# Patient Record
Sex: Male | Born: 1950
Health system: Southern US, Community
[De-identification: ages and names within clinical notes are randomized; demographics above are authoritative.]

## PROBLEM LIST (undated history)

## (undated) DIAGNOSIS — E1140 Type 2 diabetes mellitus with diabetic neuropathy, unspecified: Secondary | ICD-10-CM

## (undated) DIAGNOSIS — I10 Essential (primary) hypertension: Secondary | ICD-10-CM

## (undated) DIAGNOSIS — E119 Type 2 diabetes mellitus without complications: Secondary | ICD-10-CM

## (undated) DIAGNOSIS — K219 Gastro-esophageal reflux disease without esophagitis: Secondary | ICD-10-CM

## (undated) DIAGNOSIS — M1990 Unspecified osteoarthritis, unspecified site: Secondary | ICD-10-CM

## (undated) DIAGNOSIS — E7800 Pure hypercholesterolemia, unspecified: Secondary | ICD-10-CM

## (undated) DIAGNOSIS — H547 Unspecified visual loss: Secondary | ICD-10-CM

## (undated) HISTORY — DX: Unspecified osteoarthritis, unspecified site: M19.90

## (undated) HISTORY — DX: Essential (primary) hypertension: I10

## (undated) HISTORY — DX: Type 2 diabetes mellitus with diabetic neuropathy, unspecified: E11.40

## (undated) HISTORY — PX: HAND SURGERY: SHX662

## (undated) HISTORY — DX: Pure hypercholesterolemia, unspecified: E78.00

## (undated) HISTORY — PX: EYE SURGERY: SHX253

## (undated) HISTORY — DX: Unspecified visual loss: H54.7

## (undated) HISTORY — DX: Gastro-esophageal reflux disease without esophagitis: K21.9

## (undated) HISTORY — DX: Type 2 diabetes mellitus without complications: E11.9

## (undated) HISTORY — PX: TONSILLECTOMY: SUR1361

---

## 1977-12-09 HISTORY — PX: NOSE SURGERY: SHX723

## 1998-12-09 DIAGNOSIS — E119 Type 2 diabetes mellitus without complications: Secondary | ICD-10-CM

## 1998-12-09 DIAGNOSIS — E1165 Type 2 diabetes mellitus with hyperglycemia: Secondary | ICD-10-CM | POA: Insufficient documentation

## 1998-12-09 DIAGNOSIS — E1142 Type 2 diabetes mellitus with diabetic polyneuropathy: Secondary | ICD-10-CM | POA: Insufficient documentation

## 1998-12-09 HISTORY — DX: Type 2 diabetes mellitus without complications: E11.9

## 2002-10-22 ENCOUNTER — Encounter: Payer: Self-pay | Admitting: Internal Medicine

## 2002-10-22 ENCOUNTER — Encounter: Admission: RE | Admit: 2002-10-22 | Discharge: 2002-10-22 | Payer: Self-pay | Admitting: Internal Medicine

## 2002-11-16 ENCOUNTER — Encounter: Admission: RE | Admit: 2002-11-16 | Discharge: 2003-01-05 | Payer: Self-pay | Admitting: Internal Medicine

## 2003-03-24 ENCOUNTER — Ambulatory Visit (HOSPITAL_COMMUNITY): Admission: RE | Admit: 2003-03-24 | Discharge: 2003-03-24 | Payer: Self-pay | Admitting: Gastroenterology

## 2003-03-24 ENCOUNTER — Encounter (INDEPENDENT_AMBULATORY_CARE_PROVIDER_SITE_OTHER): Payer: Self-pay | Admitting: Specialist

## 2007-08-12 ENCOUNTER — Encounter: Admission: RE | Admit: 2007-08-12 | Discharge: 2007-08-12 | Payer: Self-pay | Admitting: Internal Medicine

## 2008-12-09 DIAGNOSIS — I10 Essential (primary) hypertension: Secondary | ICD-10-CM

## 2008-12-09 DIAGNOSIS — E1159 Type 2 diabetes mellitus with other circulatory complications: Secondary | ICD-10-CM | POA: Insufficient documentation

## 2008-12-09 HISTORY — DX: Essential (primary) hypertension: I10

## 2011-04-26 NOTE — Op Note (Signed)
NAME:  Gregory Harrison, Gregory Harrison NO.:  [id number]   MEDICAL RECORD NO.:  [id number]                   PATIENT TYPE:  AMB   LOCATION:  ENDO                                 FACILITY:  [facility]   PHYSICIAN:  Danise Edge, M.D.                DATE OF BIRTH:  January 09, 1951   DATE OF PROCEDURE:  03/24/2003  DATE OF DISCHARGE:                                 OPERATIVE REPORT   PROCEDURE INDICATION:  Gregory Harrison is a 60 year old male born June 08, 1951.  Gregory Harrison is scheduled to undergo his first screening colonoscopy  with polypectomy to prevent colon cancer.   PREMEDICATION:  Versed 7.5 mg, Demerol 50 mg.   PROCEDURE:  After obtaining informed consent, Gregory Harrison was placed in the  left lateral decubitus position.  I administered intravenous Demerol and  intravenous Versed to achieve conscious sedation for the procedure.  The  patient's blood pressure, oxygen saturation, and cardiac rhythm were  monitored throughout the procedure and documented in the medical record.   Anal inspection was normal.  Digital rectal exam revealed a small non-  nodular prostate.  The Olympus adult colonoscope was introduced into the  rectum and advanced to the cecum.  Colonoscopic preparation for the exam  today was excellent.   RECTUM:  A 0.5 mm sessile polyp was removed from the distal rectum with the  cold biopsy forceps.  SIGMOID COLON AND DESCENDING COLON:  At 30 cm from the anal verge, a 2 mm  sessile polyp was removed with electrocautery snare.  At 45 cm from the anal  verge, a 2 mm sessile polyp was removed with the electrocautery snare.  SPLENIC FLEXURE:  Normal.  TRANSVERSE COLON:  From the proximal transverse colon, a one mm sessile  polyp was removed with electrocautery snare, but no path returned.  HEPATIC FLEXURE:  Normal.  ASCENDING COLON:  A 4 mm sessile polyp was removed from the proximal  ascending with the electrocautery snare after lifting the polyp by  submucosal saline injection.  CECUM AND ILEOCECAL VALVE:  Normal.   ASSESSMENT:  1. From the proximal ascending colon, a 4 mm sessile polyp was removed with     the electrocautery snare.  2. From the proximal transverse colon, a 1 mm sessile polyp was removed with     the electrocautery snare, but no specimen retrieved for path.  3.     Two small polyps were removed from the left colon and a small polyp was     removed from the rectum.  All polyps were submitted in bottle for     pathological evaluation.  4. Right colonic diverticulosis.  Danise Edge, M.D.    MJ/MEDQ  D:  03/24/2003  T:  03/24/2003  Job:  [id number]   cc:   Georgann Housekeeper, M.D.  [address]  [place]  [place] [address]  Fax: [phone]

## 2012-02-04 ENCOUNTER — Ambulatory Visit: Payer: Self-pay | Admitting: *Deleted

## 2012-03-19 ENCOUNTER — Encounter (INDEPENDENT_AMBULATORY_CARE_PROVIDER_SITE_OTHER): Payer: PRIVATE HEALTH INSURANCE | Admitting: Ophthalmology

## 2012-03-19 DIAGNOSIS — H2510 Age-related nuclear cataract, unspecified eye: Secondary | ICD-10-CM

## 2012-03-19 DIAGNOSIS — H3581 Retinal edema: Secondary | ICD-10-CM

## 2012-03-19 DIAGNOSIS — E11319 Type 2 diabetes mellitus with unspecified diabetic retinopathy without macular edema: Secondary | ICD-10-CM

## 2012-03-19 DIAGNOSIS — H43819 Vitreous degeneration, unspecified eye: Secondary | ICD-10-CM

## 2012-03-19 DIAGNOSIS — I10 Essential (primary) hypertension: Secondary | ICD-10-CM

## 2012-03-19 DIAGNOSIS — H35039 Hypertensive retinopathy, unspecified eye: Secondary | ICD-10-CM

## 2012-03-19 DIAGNOSIS — E1139 Type 2 diabetes mellitus with other diabetic ophthalmic complication: Secondary | ICD-10-CM

## 2012-03-27 ENCOUNTER — Ambulatory Visit (INDEPENDENT_AMBULATORY_CARE_PROVIDER_SITE_OTHER): Payer: PRIVATE HEALTH INSURANCE | Admitting: Ophthalmology

## 2012-03-27 DIAGNOSIS — H3581 Retinal edema: Secondary | ICD-10-CM

## 2012-07-27 ENCOUNTER — Ambulatory Visit (INDEPENDENT_AMBULATORY_CARE_PROVIDER_SITE_OTHER): Payer: PRIVATE HEALTH INSURANCE | Admitting: Ophthalmology

## 2015-03-15 ENCOUNTER — Other Ambulatory Visit: Payer: Self-pay | Admitting: Family Medicine

## 2015-03-15 ENCOUNTER — Ambulatory Visit
Admission: RE | Admit: 2015-03-15 | Discharge: 2015-03-15 | Disposition: A | Discharge status: Home / Self Care | Payer: PPO | Source: Ambulatory Visit | Attending: Family Medicine | Admitting: Family Medicine

## 2015-03-15 DIAGNOSIS — M1990 Unspecified osteoarthritis, unspecified site: Secondary | ICD-10-CM

## 2015-04-03 ENCOUNTER — Encounter (INDEPENDENT_AMBULATORY_CARE_PROVIDER_SITE_OTHER): Payer: Self-pay | Admitting: Ophthalmology

## 2015-05-15 ENCOUNTER — Encounter (INDEPENDENT_AMBULATORY_CARE_PROVIDER_SITE_OTHER): Payer: Self-pay | Admitting: Ophthalmology

## 2015-08-23 ENCOUNTER — Encounter (INDEPENDENT_AMBULATORY_CARE_PROVIDER_SITE_OTHER): Payer: Self-pay | Admitting: Ophthalmology

## 2016-01-01 DIAGNOSIS — E1065 Type 1 diabetes mellitus with hyperglycemia: Secondary | ICD-10-CM | POA: Diagnosis not present

## 2016-01-01 DIAGNOSIS — I10 Essential (primary) hypertension: Secondary | ICD-10-CM | POA: Diagnosis not present

## 2016-01-01 DIAGNOSIS — G894 Chronic pain syndrome: Secondary | ICD-10-CM | POA: Diagnosis not present

## 2016-01-01 DIAGNOSIS — F17200 Nicotine dependence, unspecified, uncomplicated: Secondary | ICD-10-CM | POA: Diagnosis not present

## 2016-02-28 DIAGNOSIS — E1065 Type 1 diabetes mellitus with hyperglycemia: Secondary | ICD-10-CM | POA: Diagnosis not present

## 2016-02-28 DIAGNOSIS — G894 Chronic pain syndrome: Secondary | ICD-10-CM | POA: Diagnosis not present

## 2016-05-09 DIAGNOSIS — G894 Chronic pain syndrome: Secondary | ICD-10-CM | POA: Diagnosis not present

## 2016-05-09 DIAGNOSIS — S6990XS Unspecified injury of unspecified wrist, hand and finger(s), sequela: Secondary | ICD-10-CM | POA: Diagnosis not present

## 2016-05-09 DIAGNOSIS — M779 Enthesopathy, unspecified: Secondary | ICD-10-CM | POA: Diagnosis not present

## 2016-05-09 DIAGNOSIS — H109 Unspecified conjunctivitis: Secondary | ICD-10-CM | POA: Diagnosis not present

## 2016-07-29 DIAGNOSIS — F17200 Nicotine dependence, unspecified, uncomplicated: Secondary | ICD-10-CM | POA: Diagnosis not present

## 2016-07-29 DIAGNOSIS — E1065 Type 1 diabetes mellitus with hyperglycemia: Secondary | ICD-10-CM | POA: Diagnosis not present

## 2016-07-29 DIAGNOSIS — G894 Chronic pain syndrome: Secondary | ICD-10-CM | POA: Diagnosis not present

## 2016-07-29 DIAGNOSIS — I10 Essential (primary) hypertension: Secondary | ICD-10-CM | POA: Diagnosis not present

## 2016-10-10 DIAGNOSIS — E1065 Type 1 diabetes mellitus with hyperglycemia: Secondary | ICD-10-CM | POA: Diagnosis not present

## 2016-10-10 DIAGNOSIS — J40 Bronchitis, not specified as acute or chronic: Secondary | ICD-10-CM | POA: Diagnosis not present

## 2017-03-12 DIAGNOSIS — I10 Essential (primary) hypertension: Secondary | ICD-10-CM | POA: Diagnosis not present

## 2017-03-12 DIAGNOSIS — G894 Chronic pain syndrome: Secondary | ICD-10-CM | POA: Diagnosis not present

## 2017-03-12 DIAGNOSIS — E1065 Type 1 diabetes mellitus with hyperglycemia: Secondary | ICD-10-CM | POA: Diagnosis not present

## 2017-04-09 DIAGNOSIS — R52 Pain, unspecified: Secondary | ICD-10-CM | POA: Diagnosis not present

## 2017-04-09 DIAGNOSIS — E1065 Type 1 diabetes mellitus with hyperglycemia: Secondary | ICD-10-CM | POA: Diagnosis not present

## 2018-02-24 ENCOUNTER — Encounter: Payer: Self-pay | Admitting: Family Medicine

## 2018-02-24 ENCOUNTER — Ambulatory Visit (INDEPENDENT_AMBULATORY_CARE_PROVIDER_SITE_OTHER): Payer: PPO | Admitting: Family Medicine

## 2018-02-24 ENCOUNTER — Other Ambulatory Visit: Payer: Self-pay

## 2018-02-24 DIAGNOSIS — M1990 Unspecified osteoarthritis, unspecified site: Secondary | ICD-10-CM | POA: Insufficient documentation

## 2018-02-24 DIAGNOSIS — G8929 Other chronic pain: Secondary | ICD-10-CM

## 2018-02-24 DIAGNOSIS — I10 Essential (primary) hypertension: Secondary | ICD-10-CM | POA: Diagnosis not present

## 2018-02-24 DIAGNOSIS — L989 Disorder of the skin and subcutaneous tissue, unspecified: Secondary | ICD-10-CM

## 2018-02-24 DIAGNOSIS — E1142 Type 2 diabetes mellitus with diabetic polyneuropathy: Secondary | ICD-10-CM | POA: Diagnosis not present

## 2018-02-24 DIAGNOSIS — E1169 Type 2 diabetes mellitus with other specified complication: Secondary | ICD-10-CM | POA: Insufficient documentation

## 2018-02-24 DIAGNOSIS — E7800 Pure hypercholesterolemia, unspecified: Secondary | ICD-10-CM | POA: Diagnosis not present

## 2018-02-24 DIAGNOSIS — E1140 Type 2 diabetes mellitus with diabetic neuropathy, unspecified: Secondary | ICD-10-CM | POA: Insufficient documentation

## 2018-02-24 LAB — POCT GLYCOSYLATED HEMOGLOBIN (HGB A1C): HEMOGLOBIN A1C: 12

## 2018-02-24 NOTE — Progress Notes (Signed)
Subjective:  Gregory Harrison is a 67 y.o. male who presents to the [facility] today to establish as new patient.  HPI:  Previously seen at [facility] by Dr. Tamsen Roers. Patient states that had falling with last PCP over pain medications and recommendation to see nutritionist. Patient was not able to see nutritionist due to cost. Last saw him in July 2018  Otherwise saw eye doctor Gaylene Brooks but last saw him in 03-23-14. He does have some issues chronic blurry vision in R eye that dates back to childhood. No new vision changes  Chronic pain States has arthritis of multiple joints, L shoulder, bilaterally knees. Always has pain and cramping. Has been active his whole life and has lots of wear and tear on his joints. Takes norco 5-325mg  1 tablet BID.  Agreeable to signing pain med contract today.  Skin concerns Has multiple sores over his upper chest and arms. He is in the sun quite a bit as he likes to fish and does not wear sunscreen. These are long standing for patient.  Diabetes with neuropathy Takes novolin 25U BID, not sure if regular or 70/30 type. He gets this from [facility] on [address]. He is no other diabetes medications but does have peripheral neuropathy and is on gabapentin 300mg  daily prn. No CP, SOB, polyuria, polydipsia  HTN Takes HCTZ 12.5mg  qd and clonidine 0.2mg  qd. Does not check BP at home.  No HA, lightheadness, dizziness, LE edema.   ROS: per HPI, otherwise all systems reviewed and negative  PMH:  The following were reviewed and entered/updated in epic: Past Medical History:  Diagnosis Date  . Arthritis    2009/03/23  . Diabetes ([place]) 03-24-1999  . Diabetic neuropathy ([place])   . HTN (hypertension) 23-Mar-2009  . Hypercholesterolemia   . 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 22, blurry vision   Patient Active Problem List   Diagnosis Date Noted  . Hypercholesterolemia   . Diabetic neuropathy ([place])   . Arthritis   . HTN (hypertension) 12/09/2008  .  Diabetes ([place]) 12/09/1998   Past Surgical History:  Procedure Laterality Date  . EYE SURGERY Left 1990s   " to peel off a growth"  . HAND SURGERY  1980s   R hand pointer finger   . NOSE SURGERY  1979   after broke his nose    Family History  Problem Relation Age of Onset  . Heart attack Father        died in 23-Mar-1982, had open surgery  . Heart murmur Father   . Other Mother        bladder taken out for unknown reason. unspecified brain tumor  . ALS Brother   . Diabetes Paternal Grandfather     Medications- reviewed and updated Current Outpatient Medications  Medication Sig Dispense Refill  . HYDROcodone-acetaminophen (NORCO/VICODIN) 5-325 MG tablet Take 1 tablet by mouth 2 (two) times daily as needed.    . naproxen sodium (ALEVE) 220 MG tablet Take 220 mg by mouth as needed.    . cloNIDine (CATAPRES) 0.2 MG tablet Take 1 tablet by mouth daily.    Marland Kitchen gabapentin (NEURONTIN) 300 MG capsule Take 1 capsule by mouth at bedtime as needed.  5  . hydrochlorothiazide (MICROZIDE) 12.5 MG capsule Take 1 capsule by mouth daily.     No current facility-administered medications for this visit.     Allergies-reviewed and updated No Known Allergies  Social History   Socioeconomic History  .  Marital status: Married    Spouse name: None  . Number of children: None  . Years of education: None  . Highest education level: None  Social Needs  . Financial resource strain: None  . Food insecurity - worry: None  . Food insecurity - inability: None  . Transportation needs - medical: None  . Transportation needs - non-medical: None  Occupational History  . Occupation: retired  Tobacco Use  . Smoking status: Former Smoker    Packs/day: 2.00    Years: 20.00    Pack years: 40.00  . Smokeless tobacco: Never Used  Substance and Sexual Activity  . Alcohol use: No    Frequency: Never    Comment: quit in 1995  . Drug use: Yes    Types: Marijuana    Comment: marijuana pills for pain, going to  quit.   Marland Kitchen Sexual activity: Yes    Partners: Female  Other Topics Concern  . None  Social History Narrative   Lives with wife, daughter, 3 grandchildren.   Enjoys fishing and hunting and playing pool    Objective:  Physical Exam: BP (!) 152/90   Pulse (!) 52   Ht 6' 2.5" (1.892 m)   Wt 195 lb (88.5 kg)   SpO2 99%   BMI 24.70 kg/m   Gen: NAD, resting comfortably CV: RRR with no murmurs appreciated Pulm: NWOB, CTAB with no crackles, wheezes, or rhonchi GI: Normal bowel sounds present. Soft, Nontender, Nondistended. MSK: no edema, cyanosis, or clubbing noted Skin: warm, dry. Multiple nonhealing erythematous rough patches on upper chest and arms.  Neuro: grossly normal, moves all extremities Psych: Normal affect and thought content  Results for orders placed or performed in visit on 02/24/18 (from the past 72 hour(s))  POCT glycosylated hemoglobin (Hb A1C)     Status: Abnormal   Collection Time: 02/24/18  2:30 PM  Result Value Ref Range   Hemoglobin A1C 12.0   CBC     Status: Abnormal   Collection Time: 02/24/18  4:13 PM  Result Value Ref Range   WBC 5.8 3.4 - 10.8 x10E3/uL   RBC 4.84 4.14 - 5.80 x10E6/uL   Hemoglobin 13.4 13.0 - 17.7 g/dL   Hematocrit 43.1 37.5 - 51.0 %   MCV 89 79 - 97 fL   MCH 27.7 26.6 - 33.0 pg   MCHC 31.1 (L) 31.5 - 35.7 g/dL   RDW 14.6 12.3 - 15.4 %   Platelets 214 150 - 379 T73U2/GU  Basic metabolic panel     Status: Abnormal   Collection Time: 02/24/18  4:13 PM  Result Value Ref Range   Glucose 299 (H) 65 - 99 mg/dL   BUN 11 8 - 27 mg/dL   Creatinine, Ser 0.82 0.76 - 1.27 mg/dL   GFR calc non Af Amer 91 >59 mL/min/1.73   GFR calc Af Amer 105 >59 mL/min/1.73   BUN/Creatinine Ratio 13 10 - 24   Sodium 139 134 - 144 mmol/L   Potassium 4.6 3.5 - 5.2 mmol/L   Chloride 99 96 - 106 mmol/L   CO2 25 20 - 29 mmol/L   Calcium 9.5 8.6 - 10.2 mg/dL  LDL cholesterol, direct     Status: Abnormal   Collection Time: 02/24/18  4:13 PM  Result Value Ref  Range   LDL Direct 134 (H) 0 - 99 mg/dL  Lipid panel     Status: Abnormal   Collection Time: 02/24/18  4:13 PM  Result Value Ref Range  Cholesterol, Total 200 (H) 100 - 199 mg/dL   Triglycerides 129 0 - 149 mg/dL   HDL 50 >39 mg/dL   VLDL Cholesterol Cal 26 5 - 40 mg/dL   LDL Calculated 124 (H) 0 - 99 mg/dL   Chol/HDL Ratio 4.0 0.0 - 5.0 ratio    Comment:                                   T. Chol/HDL Ratio                                             Men  Women                               1/2 Avg.Risk  3.4    3.3                                   Avg.Risk  5.0    4.4                                2X Avg.Risk  9.6    7.1                                3X Avg.Risk 23.4   11.0      Assessment/Plan:  Chronic pain Indication for chronic opioid: arthritis Medication and dose: norco 5-325mg  1 tablet BID prn # pills per month: 60 Last UDS date: none Opioid Treatment Agreement signed (Y/N): Yes, signed today Opioid Treatment Agreement last reviewed with patient:  today while establishing care [place] reviewed this encounter (include red flags): 02/26/18, last Rx for norco 10-325mg  dispense #120 for 30 d supply on 04/09/17. Al rx from previous PCP with no early refills.    Diabetes ([place]) Uncontrolled. Patient agreed to follow up in 1 month but given significantly elevated to 12.0, attempted to contact patient to follow up next week. Will need to discuss starting metformin. Clarified with [facility] that patient on Novolin R but has not been filled since 2016.  HTN (hypertension) BP slightly above goal for diabetic. Clonidine dosing is only daily and not TID. Continue HCTZ and clonidine as is for now, will get PCP records to clarify medication regimen.  Skin sore Patient has multiple nonhealing erythematous patches on upper chest and arms concerning for actinic keratosis likely from sun damage. Patient instructed to make first available appointment with derm clinic.    [name],  DO PGY-2, [facility] 02/24/2018 1:37 PM

## 2018-02-24 NOTE — Patient Instructions (Addendum)
It was good to see you today!  We will get records from your previous doctor so I can take good care of you.  Please check-out at the front desk before leaving the clinic. Make an appointment in 1 month with me and another appointment with our dermatology clinic.  We are checking some labs today. If results require attention, either myself or my nurse will get in touch with you. If everything is normal, you will get a letter in the mail or a message in My Chart. Please give [place] a call if you do not hear from [place] after 2 weeks.  Please bring all of your medications with you to each visit.   Sign up for My Chart to have easy access to your labs results, and communication with your primary care physician.  Feel free to call with any questions or concerns at any time, at [phone].   Take care,  Dr. [name], [place]

## 2018-02-25 ENCOUNTER — Telehealth: Payer: Self-pay

## 2018-02-25 LAB — BASIC METABOLIC PANEL
BUN/Creatinine Ratio: 13 (ref 10–24)
BUN: 11 mg/dL (ref 8–27)
CALCIUM: 9.5 mg/dL (ref 8.6–10.2)
CO2: 25 mmol/L (ref 20–29)
CREATININE: 0.82 mg/dL (ref 0.76–1.27)
Chloride: 99 mmol/L (ref 96–106)
GFR calc non Af Amer: 91 mL/min/{1.73_m2} (ref >59)
GFR, EST AFRICAN AMERICAN: 105 mL/min/{1.73_m2} (ref >59)
Glucose: 299 mg/dL — ABNORMAL HIGH (ref 65–99)
POTASSIUM: 4.6 mmol/L (ref 3.5–5.2)
SODIUM: 139 mmol/L (ref 134–144)

## 2018-02-25 LAB — CBC
HEMATOCRIT: 43.1 % (ref 37.5–51.0)
HEMOGLOBIN: 13.4 g/dL (ref 13.0–17.7)
MCH: 27.7 pg (ref 26.6–33.0)
MCHC: 31.1 g/dL — AB (ref 31.5–35.7)
MCV: 89 fL (ref 79–97)
Platelets: 214 10*3/uL (ref 150–379)
RBC: 4.84 x10E6/uL (ref 4.14–5.80)
RDW: 14.6 % (ref 12.3–15.4)
WBC: 5.8 10*3/uL (ref 3.4–10.8)

## 2018-02-25 LAB — LIPID PANEL
CHOL/HDL RATIO: 4 ratio (ref 0.0–5.0)
Cholesterol, Total: 200 mg/dL — ABNORMAL HIGH (ref 100–199)
HDL: 50 mg/dL (ref >39)
LDL Calculated: 124 mg/dL — ABNORMAL HIGH (ref 0–99)
TRIGLYCERIDES: 129 mg/dL (ref 0–149)
VLDL CHOLESTEROL CAL: 26 mg/dL (ref 5–40)

## 2018-02-25 LAB — LDL CHOLESTEROL, DIRECT: LDL Direct: 134 mg/dL — ABNORMAL HIGH (ref 0–99)

## 2018-02-25 NOTE — Telephone Encounter (Signed)
Pt contacted and scheduled for next available with PCP, 4/1 @230pm .

## 2018-02-25 NOTE — Telephone Encounter (Signed)
-----   Message from [name], DO sent at 02/25/2018  7:59 AM EDT ----- Attempted to call patient at home and on mobile number. Left message for patient to call back. Given significantly elevated a1c would like patient to return next week for diabetes follow up instead of waiting 1 month and if he could please look at his novolog for what type it is that would be helpful.

## 2018-02-26 DIAGNOSIS — G8929 Other chronic pain: Secondary | ICD-10-CM

## 2018-02-26 DIAGNOSIS — L989 Disorder of the skin and subcutaneous tissue, unspecified: Secondary | ICD-10-CM | POA: Insufficient documentation

## 2018-02-26 DIAGNOSIS — M25512 Pain in left shoulder: Secondary | ICD-10-CM | POA: Insufficient documentation

## 2018-02-26 HISTORY — DX: Other chronic pain: G89.29

## 2018-02-26 NOTE — Assessment & Plan Note (Signed)
Patient has multiple nonhealing erythematous patches on upper chest and arms concerning for actinic keratosis likely from sun damage. Patient instructed to make first available appointment with derm clinic.

## 2018-02-26 NOTE — Assessment & Plan Note (Signed)
Indication for chronic opioid: arthritis Medication and dose: norco 5-325mg  1 tablet BID prn # pills per month: 60 Last UDS date: none Opioid Treatment Agreement signed (Y/N): Yes, signed today Opioid Treatment Agreement last reviewed with patient:  today while establishing care [place] reviewed this encounter (include red flags): 02/26/18, last Rx for norco 10-325mg  dispense #120 for 30 d supply on 04/09/17. Al rx from previous PCP with no early refills.

## 2018-02-26 NOTE — Assessment & Plan Note (Signed)
BP slightly above goal for diabetic. Clonidine dosing is only daily and not TID. Continue HCTZ and clonidine as is for now, will get PCP records to clarify medication regimen.

## 2018-02-26 NOTE — Assessment & Plan Note (Addendum)
Uncontrolled. Patient agreed to follow up in 1 month but given significantly elevated to 12.0, attempted to contact patient to follow up next week. Will need to discuss starting metformin. Clarified with [facility] that patient on Novolin R but has not been filled since 2016.

## 2018-03-09 ENCOUNTER — Telehealth: Payer: Self-pay

## 2018-03-09 ENCOUNTER — Encounter: Payer: Self-pay | Admitting: Family Medicine

## 2018-03-09 ENCOUNTER — Ambulatory Visit (INDEPENDENT_AMBULATORY_CARE_PROVIDER_SITE_OTHER): Payer: PPO | Admitting: Family Medicine

## 2018-03-09 ENCOUNTER — Other Ambulatory Visit: Payer: Self-pay

## 2018-03-09 VITALS — BP 166/80 | HR 60 | Temp 97.2°F | Wt 194.0 lb

## 2018-03-09 DIAGNOSIS — E118 Type 2 diabetes mellitus with unspecified complications: Secondary | ICD-10-CM

## 2018-03-09 LAB — POCT UA - MICROALBUMIN
Creatinine, POC: 50 mg/dL
MICROALBUMIN (UR) POC: 150 mg/L

## 2018-03-09 MED ORDER — METFORMIN HCL 500 MG PO TABS: 500.0000 mg | ORAL_TABLET | Freq: Every day | ORAL | 1 refills | Status: DC

## 2018-03-09 NOTE — Telephone Encounter (Signed)
Patient left message on nurse line that his insulin is Novolin N.  Danley Danker, RN [facility] RN)

## 2018-03-09 NOTE — Patient Instructions (Addendum)
Start metformin 500mg  daily. Take with breakfast.   Take a picture of your insulin bottle at home or call me with the type of insulin you are on.   Goal a1c is 8.5, which matches glucose of under 200.   Come back in 2 weeks.

## 2018-03-09 NOTE — Progress Notes (Signed)
    Subjective:  Gregory Harrison is a 67 y.o. male who presents to the [facility] today for diabetes follow up.  HPI:  SUBJECTIVE: 67 y.o. male for follow up of diabetes. States his a1c is always in the range of 12-14, he has never felt bad from this. He expresses that past interactions regarding diabetes control have resulted in having symptomatic hypoglycemia in the 100s which he does not want to experience again.  He would prefer for his sugars to be ideally at or just under 200. States he was on oral medication long time ago.  He was on metformin years ago which he tolerated well but was taken off of it by a previous PCP. He states that he picks up his Novolin at [facility] without a prescription last picked up 2 bottles around December.  He does not know if they keep records of this. States that his diabetes is somewhat important to him to control but as he has been cautioned about heart attack and stroke in the past and these things have never happened to him he voices that he is not overly concerned.   Diabetic Review of Systems - further diabetic ROS: no polyuria or polydipsia, no chest pain, dyspnea or TIA's, no numbness, tingling or pain in extremities, no unusual visual symptoms although he has blind in his right eye chronically.   ROS: Per HPI  Objective:  Physical Exam: BP (!) 166/80   Pulse 60   Temp (!) 97.2 F (36.2 C) (Oral)   Wt 194 lb (88 kg)   SpO2 97%   BMI 24.57 kg/m   Gen: NAD, resting comfortably CV: RRR with no murmurs appreciated Pulm: NWOB, CTAB with no crackles, wheezes, or rhonchi GI: Normal bowel sounds present. Soft, Nontender, Nondistended. MSK: no edema, cyanosis, or clubbing noted Skin: warm, dry Neuro: grossly normal, moves all extremities Psych: Normal affect and thought content   Assessment/Plan:  Diabetes ([facility]) Uncontrolled.  Motivational interviewing with patient reveals that he has at least somewhat interested in getting some better diabetes  control.  Set goal A1c for 8-8.5 after joint discussion.  Discussed that we will make small changes to see how he tolerates as we move towards getting better disease control.  Patient to continue Novolin 25 units twice daily, he will either take a picture of his bottle or call back regarding the type of insulin.  Start metformin 500 mg daily and if tolerating well after 2 weeks will likely titrate up.   [name], DO PGY-2, [facility] 03/09/2018 3:12 PM

## 2018-03-10 ENCOUNTER — Other Ambulatory Visit: Payer: Self-pay | Admitting: Family Medicine

## 2018-03-10 MED ORDER — INSULIN NPH (HUMAN) (ISOPHANE) 100 UNIT/ML ~~LOC~~ SUSP: 25.0000 [IU] | Freq: Two times a day (BID) | SUBCUTANEOUS | 11 refills | Status: DC

## 2018-03-10 NOTE — Assessment & Plan Note (Signed)
Uncontrolled.  Motivational interviewing with patient reveals that he has at least somewhat interested in getting some better diabetes control.  Set goal A1c for 8-8.5 after joint discussion.  Discussed that we will make small changes to see how he tolerates as we move towards getting better disease control.  Patient to continue Novolin 25 units twice daily, he will either take a picture of his bottle or call back regarding the type of insulin.  Start metformin 500 mg daily and if tolerating well after 2 weeks will likely titrate up.

## 2018-03-24 ENCOUNTER — Ambulatory Visit (INDEPENDENT_AMBULATORY_CARE_PROVIDER_SITE_OTHER): Payer: PPO | Admitting: Family Medicine

## 2018-03-24 ENCOUNTER — Encounter: Payer: Self-pay | Admitting: Family Medicine

## 2018-03-24 ENCOUNTER — Other Ambulatory Visit: Payer: Self-pay

## 2018-03-24 ENCOUNTER — Ambulatory Visit
Admission: RE | Admit: 2018-03-24 | Discharge: 2018-03-24 | Disposition: A | Discharge status: Home / Self Care | Payer: PPO | Source: Ambulatory Visit | Attending: Family Medicine | Admitting: Family Medicine

## 2018-03-24 VITALS — BP 144/72 | HR 67 | Temp 97.6°F | Ht 75.0 in | Wt 188.0 lb

## 2018-03-24 DIAGNOSIS — M1990 Unspecified osteoarthritis, unspecified site: Secondary | ICD-10-CM

## 2018-03-24 DIAGNOSIS — M19012 Primary osteoarthritis, left shoulder: Secondary | ICD-10-CM | POA: Diagnosis not present

## 2018-03-24 DIAGNOSIS — E118 Type 2 diabetes mellitus with unspecified complications: Secondary | ICD-10-CM

## 2018-03-24 MED ORDER — ACETAMINOPHEN 500 MG PO TABS: 500.0000 mg | ORAL_TABLET | Freq: Four times a day (QID) | ORAL | 0 refills | Status: DC | PRN

## 2018-03-24 MED ORDER — METFORMIN HCL 500 MG PO TABS: 1000.0000 mg | ORAL_TABLET | Freq: Two times a day (BID) | ORAL | 0 refills | Status: DC

## 2018-03-24 NOTE — Patient Instructions (Addendum)
For your diabetes,  - increase metformin: for the next 3 days take 500mg  twice a day. Then go up to 1000mg  twice a day.  - another diabetes medicine that we may start soon is jardiance  Get your L shoulder xray I have sent you in some tylenol   Come back in 1 month for diabetes follow up. We will recheck your blood pressure at that time.  Bring your home blood pressure machine in so we can compare.

## 2018-03-24 NOTE — Assessment & Plan Note (Signed)
Uncontrolled. Patient goal to have good diabetes control but not at the risk of having symptomatic lows so discussed that at home sugars will continue to be elevated while initiating oral medications. Since tolerating metformin well, increase to 1000mg  BID. Will likely add jardiance next before titrating insulin. Follow up in 1 month.

## 2018-03-24 NOTE — Progress Notes (Signed)
    Subjective:  Gregory Harrison is a 67 y.o. male who presents to the [facility] today for diabetes and arthritis follow up  HPI:  Diabetes - taking metformin 500mg  qd, tolerating well without diarrhea, does have a little dry mouth this morning but thinks this is because he took it without food - at home glucose has been ranging from 98-400s, averaging about 300 - most bothersome symptom is his peripheral neuropathy - still doing novolin R 25U BID  Arthritis - chronic join pain in knees and L shoulder is the biggest part of his pain.  - L shoulder pain is what keeps up from being able to work as well as he would like. He recalls falls on it remotely in the past "it was a very big fall and I regret never getting it checked out" as he feels like has has had reduced motion since. He had similar symptoms in R shoulder, except without injury, for which steroid injection in the past was beneficial - also has morning stiffness in his hands that has not acutely worsened and is mild, self resolves throughout the day - is not taking tylenol due to cost. Is only occasionally taking aleve which is more for an occasional headache and does not seem to help is joint pain - he states the best medication for his pain was norco that he took daily prn.  He did not take every day and sometimes a 30 supply would last for a couple months.  ROS: Per HPI  Objective:  Physical Exam: BP (!) 144/72   Pulse 67   Temp 97.6 F (36.4 C) (Oral)   Ht 6\' 3"  (1.905 m)   Wt 188 lb (85.3 kg)   SpO2 97%   BMI 23.50 kg/m   Gen: NAD, resting comfortably CV: RRR with no murmurs appreciated Pulm: NWOB, CTAB with no crackles, wheezes, or rhonchi GI: Normal bowel sounds present. Soft, Nontender, Nondistended. MSK: no edema, cyanosis, or clubbing noted. L shoulder with reduced passive ROM in flexion and abduction to 90 degrees, full extension/adduction. No TTP over L shoulder joint and trapezius area. Skin: warm, dry Neuro:  grossly normal, moves all extremities Psych: Normal affect and thought content  Assessment/Plan:  Diabetes (H[facility]) Uncontrolled. Patient goal to have good diabetes control but not at the risk of having symptomatic lows so discussed that at home sugars will continue to be elevated while initiating oral medications. Since tolerating metformin well, increase to 1000mg  BID. Will likely add jardiance next before titrating insulin. Follow up in 1 month.  Arthritis Chronic multi joint arthritis. Patient does have h/o of injury to L shoulder with some reduced ROM that he states has never been imaged. Discussed obtaining L shoulder xray to r/o any gross pathology with follow up to discuss steroid shoulder injection. Advised management with tylenol and rx sent to help with cost. Given that patient needs only a small amount of opiates, am hopeful that his pain can be managed without restarting norco.   [name], DO PGY-2, [facility] 03/24/2018 8:32 AM

## 2018-03-24 NOTE — Assessment & Plan Note (Signed)
Chronic multi joint arthritis. Patient does have h/o of injury to L shoulder with some reduced ROM that he states has never been imaged. Discussed obtaining L shoulder xray to r/o any gross pathology with follow up to discuss steroid shoulder injection. Advised management with tylenol and rx sent to help with cost. Given that patient needs only a small amount of opiates, am hopeful that his pain can be managed without restarting norco.

## 2018-04-28 ENCOUNTER — Ambulatory Visit (INDEPENDENT_AMBULATORY_CARE_PROVIDER_SITE_OTHER): Payer: PPO | Admitting: Family Medicine

## 2018-04-28 ENCOUNTER — Telehealth: Payer: Self-pay

## 2018-04-28 ENCOUNTER — Other Ambulatory Visit: Payer: Self-pay

## 2018-04-28 ENCOUNTER — Encounter: Payer: Self-pay | Admitting: Family Medicine

## 2018-04-28 VITALS — BP 137/74 | HR 74 | Temp 97.8°F | Wt 184.0 lb

## 2018-04-28 DIAGNOSIS — M25512 Pain in left shoulder: Secondary | ICD-10-CM | POA: Diagnosis not present

## 2018-04-28 DIAGNOSIS — E118 Type 2 diabetes mellitus with unspecified complications: Secondary | ICD-10-CM

## 2018-04-28 DIAGNOSIS — G8929 Other chronic pain: Secondary | ICD-10-CM | POA: Diagnosis not present

## 2018-04-28 MED ORDER — CANAGLIFLOZIN 100 MG PO TABS
100.0000 mg | ORAL_TABLET | Freq: Every day | ORAL | 0 refills | Status: DC
Start: 2018-04-28 — End: 2018-10-01

## 2018-04-28 MED ORDER — METHYLPREDNISOLONE ACETATE 40 MG/ML IJ SUSP
40.0000 mg | Freq: Once | INTRAMUSCULAR | Status: AC
  Administered 2018-04-28: 40 mg via INTRAMUSCULAR

## 2018-04-28 MED ORDER — ALCOHOL SWABS PADS: MEDICATED_PAD | 3 refills | Status: DC

## 2018-04-28 MED ORDER — INSULIN NPH (HUMAN) (ISOPHANE) 100 UNIT/ML ~~LOC~~ SUSP: 20.0000 [IU] | Freq: Two times a day (BID) | SUBCUTANEOUS | 3 refills | Status: DC

## 2018-04-28 NOTE — Progress Notes (Signed)
    Subjective:  Gregory Harrison is a 67 y.o. male who presents to the [facility] today for diabetes follow-up and chronic left shoulder pain  HPI:  Diabetes Compliant on metformin 1000 mg twice a day, compliant, tolerating well. Has been not taking his insulin because he thought not to.  Has been off of his Novolin and over the last month and a half. Has been checking his sugars at home and has his meter with him today.  His readings are: 401, 332, 389, 376, 348, 421, 520, 434, 379, 244, 347, 369, 359 Feeling tired.  ROS: no polyuria or polydipsia, no chest pain, dyspnea or TIA's, no numbness, tingling or pain in extremities, no unusual visual symptoms, no hypoglycemia, no medication side effects noted.     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 Soft, Nontender, Nondistended. MSK: no edema, cyanosis, or clubbing noted.  Left shoulder nontender to palpation but with reduced range of motion above 90 degrees. Skin: warm, dry Neuro: grossly normal, moves all extremities Psych: Normal affect and thought content   Assessment/Plan:  Chronic left shoulder pain X-ray from March 24, 2018 showing moderate to advanced degenerative changes and patient states that his pain and reduced range of  motion is impeding his work.  Patient counseled on risks and benefits of steroid shoulder injection and agreed to proceed today please see procedure note below.  Diabetes ([place]) Uncontrolled.  Due to miscommunication he has not been on insulin.  Continue metformin 1000 mg twice daily.  Discussed starting Invokana which is on patient's formulary given his ASCVD risk.  He will continue to check his glucose readings at home and if still elevated after starting Invokana for 1 week and he will restart his insulin at a reduced dose of Novolin N 20 units twice a day.  Follow-up in 1 month    INJECTION: Patient was given informed consent, signed copy in the chart. Appropriate time out was taken. Area prepped in usual fashion with overlying skin cleaned using isopropyl alcohol. Local anesthesia achieved using Ethyl Chloride spray (Cooling spray). 1 cc of methylprednisolone 40 mg/ml plus  4 cc of 1% lidocaine without epinephrine was injected into the L shoulder subacromial bursa using a(n) posterior lateral approach. The patient tolerated the procedure well. There were no complications. Post procedure instructions were given.   [name], DO PGY-2, [facility] 04/28/2018 9:07 AM

## 2018-04-28 NOTE — Telephone Encounter (Signed)
[facility] pharmacy called requesting refill of patients insulin and hydrocodone. Their call back [phone] Wallace Cullens, RN

## 2018-04-28 NOTE — Assessment & Plan Note (Addendum)
Uncontrolled.  Due to miscommunication he has not been on insulin.  Continue metformin 1000 mg twice daily.  Discussed starting Invokana which is on patient's formulary given his ASCVD risk.  He will continue to check his glucose readings at home and if still elevated after starting Invokana for 1 week and he will restart his insulin at a reduced dose of Novolin N 20 units twice a day.  Follow-up in 1 month

## 2018-04-28 NOTE — Telephone Encounter (Signed)
Patient left message that he was expecting a prescription for both insulin and pain medication at his pharmacy but they were not there. [place].   Danley Danker, RN [facility] RN)

## 2018-04-28 NOTE — Patient Instructions (Addendum)
Continue metformin 1000mg  twice a day.  Start invokana (canaglifozin) 100mg  in the morning. For 1 week, then check your sugar, if greater than >250 then can restart novolin 20 units BID.   Come back in 1 -2 months for follow up.  Joint Injection, Care After Refer to this sheet in the next few weeks. These instructions provide you with information about caring for yourself after your procedure. Your health care provider may also give you more specific instructions. Your treatment has been planned according to current medical practices, but problems sometimes occur. Call your health care provider if you have any problems or questions after your procedure. What can I expect after the procedure? After the procedure, it is common to have:  Soreness.  Warmth.  Swelling. You may have more pain, swelling, and warmth than you did before the injection. This reaction may last for about one day. Follow these instructions at home: Bathing  If you were given a bandage (dressing), keep it dry until your health care provider says it can be removed. Ask your health care provider when you can start showering or taking a bath. Managing pain, stiffness, and swelling  If directed, apply ice to the injection area:  Put ice in a plastic bag.  Place a towel between your skin and the bag.  Leave the ice on for 20 minutes, 2-3 times per day.  Do not apply heat to the area.  Raise the injection area above the level of your heart while you are sitting or lying down. Activity  Avoid strenuous activities for as long as directed by your health care provider. Ask your health care provider when you can return to your normal activities. General instructions  Take medicines only as directed by your health care provider.  Do not take aspirin or other over-the-counter medicines unless your health care provider says you can.  Check your injection site every day for signs of infection. Watch for:  Redness,  swelling, or pain.  Fluid, blood, or pus.  Follow your health care provider's instructions about dressing changes and removal. Contact a health care provider if:  You have symptoms at your injection site that last longer than two days after your procedure.  You have redness, swelling, or pain in your injection area.  You have fluid, blood, or pus coming from your injection site.  You have warmth in your injection area.  You have a fever.  Your pain is not controlled with medicine. Get help right away if:  Your joint turns very red.  Your joint becomes very swollen.  Your joint pain is severe. This information is not intended to replace advice given to you by your health care provider. Make sure you discuss any questions you have with your health care provider. Document Released: 12/16/2014 Document Revised: 07/31/2016 Document Reviewed: 10/05/2014 Elsevier Interactive Patient Education  [employer].

## 2018-04-28 NOTE — Addendum Note (Signed)
Addended by: [name] on: 04/28/2018 04:01 PM   Modules accepted: Orders

## 2018-04-28 NOTE — Assessment & Plan Note (Signed)
X-ray from March 24, 2018 showing moderate to advanced degenerative changes and patient states that his pain and reduced range of motion is impeding his work.  Patient counseled on risks and benefits of steroid shoulder injection and agreed to proceed today please see procedure note below.

## 2018-04-28 NOTE — Telephone Encounter (Addendum)
Spoke to pharmacy. Patient had apparently found out that he does not have a copay if he gets his insulin at [facility] so he initiated a request for his insulin to be refilled. NPH, needles per patient preference, alcohol pads were given via verbal order. Patient also initiated a request for hydrocodone which he had not spoken to me about during our visit today. [place] PMP reviewed. No refills since 04/09/17. Discussed that no refills without office visit.  Spoke with patient. He has initiated a records release. Discussed that I would not be giving hydrocodone refill without office visit for full assessment and would like to review records first. Patient was upset stating that he has no choice but to take more sleeping pills which are tylenol pm to get rest but voiced good understanding.

## 2018-05-11 ENCOUNTER — Other Ambulatory Visit: Payer: Self-pay

## 2018-05-11 DIAGNOSIS — E118 Type 2 diabetes mellitus with unspecified complications: Secondary | ICD-10-CM

## 2018-05-12 ENCOUNTER — Other Ambulatory Visit: Payer: Self-pay

## 2018-05-12 DIAGNOSIS — E118 Type 2 diabetes mellitus with unspecified complications: Secondary | ICD-10-CM

## 2018-05-12 MED ORDER — METFORMIN HCL 1000 MG PO TABS: 1000.0000 mg | ORAL_TABLET | Freq: Two times a day (BID) | ORAL | 2 refills | Status: DC

## 2018-06-03 ENCOUNTER — Ambulatory Visit (INDEPENDENT_AMBULATORY_CARE_PROVIDER_SITE_OTHER): Payer: PPO | Admitting: Family Medicine

## 2018-06-03 ENCOUNTER — Encounter: Payer: Self-pay | Admitting: Family Medicine

## 2018-06-03 ENCOUNTER — Other Ambulatory Visit: Payer: Self-pay

## 2018-06-03 VITALS — BP 142/76 | HR 77 | Temp 97.8°F | Ht 75.0 in | Wt 188.0 lb

## 2018-06-03 DIAGNOSIS — M65311 Trigger thumb, right thumb: Secondary | ICD-10-CM

## 2018-06-03 DIAGNOSIS — I10 Essential (primary) hypertension: Secondary | ICD-10-CM

## 2018-06-03 DIAGNOSIS — E1142 Type 2 diabetes mellitus with diabetic polyneuropathy: Secondary | ICD-10-CM

## 2018-06-03 DIAGNOSIS — Z23 Encounter for immunization: Secondary | ICD-10-CM | POA: Diagnosis not present

## 2018-06-03 DIAGNOSIS — G5601 Carpal tunnel syndrome, right upper limb: Secondary | ICD-10-CM

## 2018-06-03 LAB — POCT GLYCOSYLATED HEMOGLOBIN (HGB A1C): HBA1C, POC (CONTROLLED DIABETIC RANGE): 10.7 % — AB (ref 0.0–7.0)

## 2018-06-03 MED ORDER — TETANUS-DIPHTH-ACELL PERTUSSIS 5-2.5-18.5 LF-MCG/0.5 IM SUSP: 0.5000 mL | Freq: Once | INTRAMUSCULAR | 0 refills | Status: AC

## 2018-06-03 MED ORDER — WRIST SPLINT/COCK-UP/RIGHT L MISC: Status: DC

## 2018-06-03 NOTE — Progress Notes (Addendum)
Subjective:  Gregory Harrison is a 67 y.o. male who presents to the [facility] today for diabetes follow up and for wrist numbness  HPI:  Diabetes Compliant on metformin 1000mg  BID, tolerating well. Now back on novolin N 20U BID and checks his sugars at home usually twice day. States morning readings have been averaging around 145-190s and the evening readings have been about 210s Has not been able to pick up invokana because he thinks he was told was told was $400, is not sure No symptomatic lows.  No polyuria or polydipsia. No CP, SOB, n/v. No new visual symptoms Has been over a year since last diabetic eye exam. Recalls being told at last visit that had "bleeding behind eyes"   Wrist numbness Having new R wrist numbness recently, Has been fishing a lot with lots of repetitive wrist motions. Feels some tingling sensation as well. Most prominent on lateral side of wrist and hand over thenar side. No numbness or tingling of pointer or middle finger.  He is right-handed.  Of note he is also complaining of right thumb catching with flexion that is chronic.  HTN States is compliant on clonidine 0.21 tablet daily, HCTZ 12.5 mg daily No chest pain, shortness of breath, TIAs, lightheadedness, edema    ROS: Per HPI  PMH: He reports that he has quit smoking. He has a 40.00 pack-year smoking history. He has never used smokeless tobacco. He reports that he has current or past drug history. Drug: Marijuana. He reports that he does not drink alcohol.   Objective:  Physical Exam: BP (!) 142/76   Pulse 77   Temp 97.8 F (36.6 C) (Oral)   Ht 6\' 3"  (1.905 m)   Wt 188 lb (85.3 kg)   SpO2 98%   BMI 23.50 kg/m   Gen: NAD, resting comfortably CV: RRR with no murmurs appreciated Pulm: NWOB, CTAB with no crackles, wheezes, or rhonchi GI: Normal bowel sounds present. Soft, Nontender, Nondistended. MSK: no edema, cyanosis, or clubbing noted.  Right wrist with positive Tinel sign, numbness over right  lateral wrist particularly thenar area.  Grip strength  Normal.  Right thumb tendon prominent with a palpable nodule, normal range of motion Skin: warm, dry.  No erythema or edema of right wrist  Results for orders placed or performed in visit on 06/03/18 (from the past 72 hour(s))  HgB A1c     Status: Abnormal   Collection Time: 06/03/18  2:15 PM  Result Value Ref Range   Hemoglobin A1C  4.0 - 5.6 %   HbA1c POC (<> result, manual entry)  4.0 - 5.6 %   HbA1c, POC (prediabetic range)  5.7 - 6.4 %   HbA1c, POC (controlled diabetic range) 10.7 (A) 0.0 - 7.0 %     Assessment/Plan:  Diabetes ([facility]) Uncontrolled but A1c improved to 10.7 from 12.0 in March.   - Continue metformin 1000mg  twice daily.  - Increase Novolin N to 25 units twice daily - Invokana  on patient formulary, he will talk with his pharmacy or his insurance regarding cost - Referral to Optho made for diabetic eye exam given patient reported history of diabetic retinopathy  HTN (hypertension) Stable and controlled on current regimen.  Although clonidine is usually a twice daily medication will continue at 0.2 mg daily.  Continue hydrochlorthiazide 12.5 mg daily  Carpal tunnel syndrome of right wrist Patient right wrist numbness is likely due to carpal tunnel syndrome from overuse while fishing given positive Tinel sign.  Discussed trial of cock-up wrist splint at night and as needed NSAIDs.  Trigger finger of right thumb Patient is describing catching of his right thumb with movement and physical exam shows prominent tendon with nodule most consistent with trigger finger.  This finding is not on any of his other fingers.  He is quite functional with this so discussed continued watchful waiting as patient is not eager for any interventions at this time.  Health maintenance Patient to get tetanus shot at pharmacy  Follow-up in 6 weeks for diabetes  [name], DO PGY-2, [facility] 06/03/2018 2:40 PM

## 2018-06-03 NOTE — Patient Instructions (Addendum)
Increase your novolin to 25U twice day if your blood sugar is too low then go back to 20U  Please check with your pharmacy and insurance about invokana.  Please wear a cock up wrist splint, please take over the counter ibuprofen 600mg  every 6 hours as needed.  Get your tetanus shot at the pharmacy   Carpal Tunnel Syndrome Carpal tunnel syndrome is a condition that causes pain in your hand and arm. The carpal tunnel is a narrow area located on the palm side of your wrist. Repeated wrist motion or certain diseases may cause swelling within the tunnel. This swelling pinches the main nerve in the wrist (median nerve). What are the causes? This condition may be caused by:  Repeated wrist motions.  Wrist injuries.  Arthritis.  A cyst or tumor in the carpal tunnel.  Fluid buildup during pregnancy.  Sometimes the cause of this condition is not known. What increases the risk? This condition is more likely to develop in:  People who have jobs that cause them to repeatedly move their wrists in the same motion, such as Art gallery manager.  Women.  People with certain conditions, such as: ? Diabetes. ? Obesity. ? An underactive thyroid (hypothyroidism). ? Kidney failure.  What are the signs or symptoms? Symptoms of this condition include:  A tingling feeling in your fingers, especially in your thumb, index, and middle fingers.  Tingling or numbness in your hand.  An aching feeling in your entire arm, especially when your wrist and elbow are bent for long periods of time.  Wrist pain that goes up your arm to your shoulder.  Pain that goes down into your palm or fingers.  A weak feeling in your hands. You may have trouble grabbing and holding items.  Your symptoms may feel worse during the night. How is this diagnosed? This condition is diagnosed with a medical history and physical exam. You may also have tests, including:  An electromyogram (EMG). This test measures  electrical signals sent by your nerves into the muscles.  X-rays.  How is this treated? Treatment for this condition includes:  Lifestyle changes. It is important to stop doing or modify the activity that caused your condition.  Physical or occupational therapy.  Medicines for pain and inflammation. This may include medicine that is injected into your wrist.  A wrist splint.  Surgery.  Follow these instructions at home: If you have a splint:  Wear it as told by your health care provider. Remove it only as told by your health care provider.  Loosen the splint if your fingers become numb and tingle, or if they turn cold and blue.  Keep the splint clean and dry. General instructions  Take over-the-counter and prescription medicines only as told by your health care provider.  Rest your wrist from any activity that may be causing your pain. If your condition is work related, talk to your employer about changes that can be made, such as getting a wrist pad to use while typing.  If directed, apply ice to the painful area: ? Put ice in a plastic bag. ? Place a towel between your skin and the bag. ? Leave the ice on for 20 minutes, 2-3 times per day.  Keep all follow-up visits as told by your health care provider. This is important.  Do any exercises as told by your health care provider, physical therapist, or occupational therapist. Contact a health care provider if:  You have new symptoms.  Your pain  is not controlled with medicines.  Your symptoms get worse. This information is not intended to replace advice given to you by your health care provider. Make sure you discuss any questions you have with your health care provider. Document Released: 11/22/2000 Document Revised: 04/04/2016 Document Reviewed: 04/12/2015 Elsevier Interactive Patient Education  [employer].

## 2018-06-04 DIAGNOSIS — G5601 Carpal tunnel syndrome, right upper limb: Secondary | ICD-10-CM | POA: Insufficient documentation

## 2018-06-04 DIAGNOSIS — M65311 Trigger thumb, right thumb: Secondary | ICD-10-CM

## 2018-06-04 HISTORY — DX: Trigger thumb, right thumb: M65.311

## 2018-06-04 NOTE — Assessment & Plan Note (Addendum)
Uncontrolled but A1c improved to 10.7 from 12.0 in March.   - Continue metformin 1000mg  twice daily.  - Increase Novolin N to 25 units twice daily - Invokana  on patient formulary, he will talk with his pharmacy or his insurance regarding cost - Referral to Optho made for diabetic eye exam given patient reported history of diabetic retinopathy

## 2018-06-04 NOTE — Assessment & Plan Note (Signed)
Patient is describing catching of his right thumb with movement and physical exam shows prominent tendon with nodule most consistent with trigger finger.  This finding is not on any of his other fingers.  He is quite functional with this so discussed continued watchful waiting as patient is not eager for any interventions at this time.

## 2018-06-04 NOTE — Assessment & Plan Note (Signed)
Stable and controlled on current regimen.  Although clonidine is usually a twice daily medication will continue at 0.2 mg daily.  Continue hydrochlorthiazide 12.5 mg daily

## 2018-06-04 NOTE — Assessment & Plan Note (Signed)
Patient right wrist numbness is likely due to carpal tunnel syndrome from overuse while fishing given positive Tinel sign.  Discussed trial of cock-up wrist splint at night and as needed NSAIDs.

## 2018-06-09 ENCOUNTER — Ambulatory Visit (INDEPENDENT_AMBULATORY_CARE_PROVIDER_SITE_OTHER): Payer: PPO | Admitting: Family Medicine

## 2018-06-09 ENCOUNTER — Encounter: Payer: Self-pay | Admitting: Family Medicine

## 2018-06-09 VITALS — BP 110/70 | HR 68 | Temp 98.0°F | Wt 188.4 lb

## 2018-06-09 DIAGNOSIS — A770 Spotted fever due to Rickettsia rickettsii: Secondary | ICD-10-CM

## 2018-06-09 MED ORDER — DOXYCYCLINE HYCLATE 100 MG PO TABS: 100.0000 mg | ORAL_TABLET | Freq: Two times a day (BID) | ORAL | 0 refills | Status: DC

## 2018-06-09 NOTE — Progress Notes (Signed)
   Subjective   Patient ID: Gregory Harrison    DOB: 04-20-1951, 67 y.o. male   MRN: [id number]  CC: "Tick bite"  HPI: Gregory Harrison is a 67 y.o. male who presents to clinic today for the following:  Rash: Patient noticed rash last night on posterior right shoulder. He recalls pulling of multiple ticks throughout the Spring in his yard here in [place] [place] [facility] which he manually pulls off. The last tick pulled off ago was 5 days ago. Patient has several dogs. He endorses right forearm and left shoulder numbness, light headaches, aching in his knees bilaterally, left sided abdominal pain.  Denies fevers or chills, nausea, vomiting, diarrhea, chest pain or shortness of breath.  Patient denies any recent travel to the [place] area.  He has no prior history of tickborne diseases or antibiotic use for such.  ROS: see HPI for pertinent.  [place]: DM with neuropathy, HTN, HLD.  Surgical history nose, right hand, left eye.  Family history MI, ALS (brother).  Smoking status reviewed. Medications reviewed.  Objective   BP 110/70   Pulse 68   Temp 98 F (36.7 C)   Wt 188 lb 6.4 oz (85.5 kg)   SpO2 96%   BMI 23.55 kg/m  Vitals and nursing note reviewed.  General: well nourished, well developed, NAD with non-toxic appearance HEENT: normocephalic, atraumatic, moist mucous membranes Neck: supple, non-tender without lymphadenopathy Cardiovascular: regular rate and rhythm without murmurs, rubs, or gallops Lungs: clear to auscultation bilaterally with normal work of breathing Abdomen: soft, non-tender, non-distended, normoactive bowel sounds Skin: warm, dry, cap refill < 2 seconds, solitary macular rash on right shoulder with central Without fluctuance Extremities: warm and well perfused, normal tone, no edema      Assessment & Plan   RMSF [facility] spotted fever) Acute.  Has known tick exposure.  Does have symptoms of headache, abdominal pain, arthralgia.  Afebrile.  No recent travel to Lyme disease  regions.  Suspect early manifestation of RMSF. - Given doxycycline 100 mg twice daily for 5 days - Reviewed return precautions - Discussed importance of prevention of tick exposure  No orders of the defined types were placed in this encounter.  Meds ordered this encounter  Medications  . DISCONTD: doxycycline (VIBRA-TABS) 100 MG tablet    Sig: Take 1 tablet (100 mg total) by mouth 2 (two) times daily.    Dispense:  20 tablet    Refill:  0  . doxycycline (VIBRA-TABS) 100 MG tablet    Sig: Take 1 tablet (100 mg total) by mouth 2 (two) times daily.    Dispense:  10 tablet    Refill:  0    Harriet Butte, [place], PGY-2 06/09/2018, 9:45 AM

## 2018-06-09 NOTE — Patient Instructions (Signed)
Thank you for coming in to see [place] today. Please see below to review our plan for today's visit.  I have given you an antibiotic called doxycycline which you will take twice daily for 5 days.  It is important to prevent take exposure by wearing long sleeve pants and shirts when outside if you are going in a known tick infested area.  Wearing with bright clothing such as white and talking in her pants and your socks will help prevent ticks from attaching.  Please call the clinic at [phone] if your symptoms worsen or you have any concerns. It was our pleasure to serve you.  Harriet Butte, [place], PGY-2

## 2018-06-09 NOTE — Assessment & Plan Note (Signed)
Acute.  Has known tick exposure.  Does have symptoms of headache, abdominal pain, arthralgia.  Afebrile.  No recent travel to Lyme disease regions.  Suspect early manifestation of RMSF. - Given doxycycline 100 mg twice daily for 5 days - Reviewed return precautions - Discussed importance of prevention of tick exposure

## 2018-06-18 DIAGNOSIS — E113413 Type 2 diabetes mellitus with severe nonproliferative diabetic retinopathy with macular edema, bilateral: Secondary | ICD-10-CM | POA: Diagnosis not present

## 2018-06-18 DIAGNOSIS — H2513 Age-related nuclear cataract, bilateral: Secondary | ICD-10-CM | POA: Diagnosis not present

## 2018-06-18 DIAGNOSIS — E113493 Type 2 diabetes mellitus with severe nonproliferative diabetic retinopathy without macular edema, bilateral: Secondary | ICD-10-CM | POA: Diagnosis not present

## 2018-07-01 DIAGNOSIS — H2513 Age-related nuclear cataract, bilateral: Secondary | ICD-10-CM | POA: Diagnosis not present

## 2018-07-01 DIAGNOSIS — E113511 Type 2 diabetes mellitus with proliferative diabetic retinopathy with macular edema, right eye: Secondary | ICD-10-CM | POA: Diagnosis not present

## 2018-07-01 DIAGNOSIS — H31003 Unspecified chorioretinal scars, bilateral: Secondary | ICD-10-CM | POA: Diagnosis not present

## 2018-07-01 DIAGNOSIS — E113592 Type 2 diabetes mellitus with proliferative diabetic retinopathy without macular edema, left eye: Secondary | ICD-10-CM | POA: Diagnosis not present

## 2018-07-01 DIAGNOSIS — H3582 Retinal ischemia: Secondary | ICD-10-CM | POA: Diagnosis not present

## 2018-07-06 ENCOUNTER — Other Ambulatory Visit: Payer: Self-pay | Admitting: Family Medicine

## 2018-07-30 ENCOUNTER — Telehealth: Payer: Self-pay

## 2018-07-30 NOTE — Telephone Encounter (Signed)
Called twice, no response. Left message stating I called.

## 2018-07-30 NOTE — Telephone Encounter (Signed)
Patient left message he would like to talk to PCP about his back because he has not been able to make appt (?) with PCP.   Appt desk is contacting patient to make appt. Note routed to PCP for call.  Call back is [phone]  Danley Danker, RN [facility] [place])

## 2018-07-31 ENCOUNTER — Other Ambulatory Visit: Payer: Self-pay | Admitting: Family Medicine

## 2018-07-31 ENCOUNTER — Ambulatory Visit (INDEPENDENT_AMBULATORY_CARE_PROVIDER_SITE_OTHER): Payer: PPO | Admitting: Family Medicine

## 2018-07-31 ENCOUNTER — Other Ambulatory Visit: Payer: Self-pay

## 2018-07-31 ENCOUNTER — Ambulatory Visit
Admission: RE | Admit: 2018-07-31 | Discharge: 2018-07-31 | Disposition: A | Discharge status: Home / Self Care | Payer: PPO | Source: Ambulatory Visit | Attending: Family Medicine | Admitting: Family Medicine

## 2018-07-31 ENCOUNTER — Encounter: Payer: Self-pay | Admitting: Family Medicine

## 2018-07-31 ENCOUNTER — Telehealth: Payer: Self-pay | Admitting: Family Medicine

## 2018-07-31 VITALS — BP 158/80 | HR 105 | Wt 180.0 lb

## 2018-07-31 DIAGNOSIS — M545 Low back pain, unspecified: Secondary | ICD-10-CM | POA: Insufficient documentation

## 2018-07-31 DIAGNOSIS — M5137 Other intervertebral disc degeneration, lumbosacral region: Secondary | ICD-10-CM | POA: Diagnosis not present

## 2018-07-31 DIAGNOSIS — M160 Bilateral primary osteoarthritis of hip: Secondary | ICD-10-CM | POA: Diagnosis not present

## 2018-07-31 MED ORDER — BACLOFEN 10 MG PO TABS: 10.0000 mg | ORAL_TABLET | Freq: Three times a day (TID) | ORAL | 1 refills | Status: DC | PRN

## 2018-07-31 MED ORDER — TRAMADOL HCL 50 MG PO TABS: 50.0000 mg | ORAL_TABLET | Freq: Four times a day (QID) | ORAL | 1 refills | Status: DC | PRN

## 2018-07-31 NOTE — Progress Notes (Signed)
Subjective:    Gregory Harrison is a 67 y.o. male who presents for evaluation of low back pain. The patient has had back pain like this 20 years ago, but none since. Denies ay history of back surgery or disc disease. . Symptoms have been present for 1 week and are gradually worsening.  Onset was related to / precipitated by no known injury.  He did endorse riding a lawnmower about a week ago and said that his yard was full of many holes and bumps and that when he rode over them there were some forceful vibrations to his back.  The pain is located in the right lumbar area, right gluteal area, radiating to posterior right leg and radiates to the right inguinal area and testes. The pain is described as aching and occurs all day. He rates his pain as a 8 on a scale of 0-10. Symptoms are exacerbated by sitting, standing and straining. Symptoms are improved by nothing. He has also tried acetaminophen, muscle relaxants, NSAIDs and rest which provided no symptom relief. He has no weakness in the right leg, urinary hesitancy, urinary incontinence, urinary retention, constipation and groin/perineal numbness associated with the back pain. The patient has no "red flag" history indicative of complicated back pain.  The following portions of the patient's history were reviewed and updated as appropriate: allergies, current medications, past family history, past medical history, past social history, past surgical history and problem list.  Review of Systems Pertinent items noted in HPI and remainder of comprehensive ROS otherwise negative.    Objective:  Blood pressure (!) 158/80, pulse (!) 105, weight 180 lb (81.6 kg), SpO2 98 %.  Gen: listless, appears uncomfortable MSK: Right lower back/buttocks tender, no patellar/Achilles reflex, positive right straight leg,  Assessment and Plan:  Acute right-sided low back pain Acute onset with progressive symptoms of right back pain with radiation to inguinal region testes and  down right lower leg.  Possibly caused by vibrational trauma from right lumbar.  Worrisome symptoms of some level of compression given the regional distribution of the affected areas down right lower leg and inguinal region.  Most likely caused by bulging disc.  Less likely to be related to renal stone given no any symptoms.  No other red flag symptoms raise concern for more urgency work-up for things like malignancy, infection, cauda equina syndrome.  Will get plain films of lumbar and pelvic.  Tramadol and baclofen for pain.  Will refer to physical therapy to keep patient active.  Patient follow-up in 4 weeks to reassess ongoing pain.

## 2018-07-31 NOTE — Telephone Encounter (Signed)
Spoke with Sharyn Lull and changed order to correct one.  Jazmin Hartsell,CMA

## 2018-07-31 NOTE — Telephone Encounter (Signed)
Sharyn Lull from [facility] is calling and the order she received, one is correct and one is incorrect. The order conserning the Lumbar needs to say DG instead of xray. The best number to contact her is [phone]. The pt is currently at GI.

## 2018-07-31 NOTE — Patient Instructions (Signed)

## 2018-07-31 NOTE — Assessment & Plan Note (Signed)
Acute onset with progressive symptoms of right back pain with radiation to inguinal region testes and down right lower leg.  Possibly caused by vibrational trauma from right lumbar.  Worrisome symptoms of some level of compression given the regional distribution of the affected areas down right lower leg and inguinal region.  Most likely caused by bulging disc.  Less likely to be related to renal stone given no any symptoms.  No other red flag symptoms raise concern for more urgency work-up for things like malignancy, infection, cauda equina syndrome.  Will get plain films of lumbar and pelvic.  Tramadol and baclofen for pain.  Will refer to physical therapy to keep patient active.  Patient follow-up in 4 weeks to reassess ongoing pain.

## 2018-08-03 ENCOUNTER — Other Ambulatory Visit: Payer: Self-pay | Admitting: *Deleted

## 2018-08-03 DIAGNOSIS — E118 Type 2 diabetes mellitus with unspecified complications: Secondary | ICD-10-CM

## 2018-08-03 MED ORDER — METFORMIN HCL 1000 MG PO TABS: 1000.0000 mg | ORAL_TABLET | Freq: Two times a day (BID) | ORAL | 2 refills | Status: DC

## 2018-08-05 ENCOUNTER — Ambulatory Visit (INDEPENDENT_AMBULATORY_CARE_PROVIDER_SITE_OTHER): Payer: PPO | Admitting: Family Medicine

## 2018-08-05 VITALS — BP 160/80 | HR 82 | Temp 98.3°F | Wt 181.6 lb

## 2018-08-05 DIAGNOSIS — M545 Low back pain: Secondary | ICD-10-CM

## 2018-08-05 DIAGNOSIS — I10 Essential (primary) hypertension: Secondary | ICD-10-CM

## 2018-08-05 DIAGNOSIS — E118 Type 2 diabetes mellitus with unspecified complications: Secondary | ICD-10-CM

## 2018-08-05 MED ORDER — ONETOUCH DELICA LANCING DEV MISC: 1.0000 | 0 refills | Status: AC

## 2018-08-05 MED ORDER — CLONIDINE HCL 0.2 MG PO TABS: 0.2000 mg | ORAL_TABLET | Freq: Two times a day (BID) | ORAL | 3 refills | Status: DC

## 2018-08-05 MED ORDER — ALCOHOL SWABS PADS: MEDICATED_PAD | 3 refills | Status: AC

## 2018-08-05 MED ORDER — GLUCOSE BLOOD VI STRP: ORAL_STRIP | 12 refills | Status: DC

## 2018-08-05 MED ORDER — INSULIN NPH (HUMAN) (ISOPHANE) 100 UNIT/ML ~~LOC~~ SUSP: 20.0000 [IU] | Freq: Two times a day (BID) | SUBCUTANEOUS | 3 refills | Status: DC

## 2018-08-05 MED ORDER — ONETOUCH VERIO W/DEVICE KIT: 1.0000 | PACK | 0 refills | Status: DC

## 2018-08-05 MED ORDER — ONETOUCH DELICA LANCETS FINE MISC: 1.0000 | Freq: Three times a day (TID) | 3 refills | Status: AC

## 2018-08-05 MED ORDER — METFORMIN HCL 500 MG PO TABS: 500.0000 mg | ORAL_TABLET | Freq: Two times a day (BID) | ORAL | 3 refills | Status: DC

## 2018-08-05 NOTE — Assessment & Plan Note (Addendum)
Uncontrolled.  Patient is noncompliant on his medication regimen.  He has a long-standing history of medication noncompliance and self titrating medications. -Restart metformin 500 mg twice daily -Restart Novolin N 20 units twice daily -New glucometer Rx given today.  Patient will check sugars 2-3 times a day for the next 1 month and follow-up with his glucometer

## 2018-08-05 NOTE — Progress Notes (Signed)
Subjective:  Gregory Harrison is a 67 y.o. male who presents to the [facility] today for diabetes and back pain follow up  HPI:  Diabetes Has not been taking metformin for the last 1 month because he felt like it was causing him to be dizzy.  States is done well on the 500mg  dosing in the past however when he went up to 1000 mg he started to feel lightheaded which resolved after stopping the metformin.  He was not having any diarrhea.  He did not fall or pass out Has been out of novolin N for the last 1.5 months.  Was told that invokana was over $400. His glucometer has been broken over the last month and a half and he has no idea what his sugars have been.  He has not had any symptomatic hypoglycemic episodes that he can tell No polyuria, polydipsia, new vision changes.    Back pain Patient was seen for acute lower back pain 5 days ago in the setting of chronic low back pain.  This worsened after riding the lawnmower over his yard and that has a lot of holes and bumps.  He states that his back pain is still greater on the right and he has been having trouble doing any of his usual activities has been largely immobile though he did go fishing yesterday. No bowel or bladder incontinence.  No numbness or tingling.  He does continue to have testicular pain that he said was evaluated last week by a "older doctor who said everything was normal" and told him that it may be referred from his back. No falls or new trauma. Has not yet started physical therapy because he has not gotten his imaging results  Hypertension States has been compliant on his home hydrochlorthiazide 12.5 mg daily and clonidine 0.2 mg daily. Reports that he has been following a low-salt diet No chest pain, shortness of breath, lower extremity edema, dizziness, lightheadedness.  ROS: Per HPI  PMH: He reports that he has quit smoking. He has a 40.00 pack-year smoking history. He has never used smokeless tobacco. He reports that he  has current or past drug history. Drug: Marijuana. He reports that he does not drink alcohol.   Objective:  Physical Exam: BP (!) 160/80 (BP Location: Right Arm, Patient Position: Sitting, Cuff Size: Normal)   Pulse 82   Temp 98.3 F (36.8 C) (Oral)   Wt 181 lb 9.6 oz (82.4 kg)   SpO2 96%   BMI 22.70 kg/m   Gen: NAD, resting comfortably CV: RRR with no murmurs appreciated Pulm: NWOB, CTAB with no crackles, wheezes, or rhonchi GI: Normal bowel sounds present. Soft, Nontender, Nondistended. MSK: Tender to palpation over bilateral lumbar paraspinal muscles.  No bony spinous tenderness on palpation.  Skin: warm, dry Neuro: grossly normal, moves all extremities.   Psych: Normal affect and thought content   Assessment/Plan:  Diabetes ([place]) Uncontrolled.  Patient is noncompliant on his medication regimen.  He has a long-standing history of medication noncompliance and self titrating medications. -Restart metformin 500 mg twice daily -Restart Novolin N 20 units twice daily -New glucometer Rx given today.  Patient will check sugars 2-3 times a day for the next 1 month and follow-up with his glucometer  HTN (hypertension) Uncontrolled.  Continue hydrochlorothiazide 12.5 mg daily.  Clonidine is unusual choice for this age group however he has been on it for a while and tolerating well, says it is usually a twice daily medication discussed  trial of of 0.2 mg twice daily.  Will up in 1 month.  If still uncontrolled then would likely change from clonidine to an ACE or ARB given his diabetes and good kidney function.  Acute right-sided low back pain Likely due to muscle strain after mowing his lawn.  Plain films of lumbar spine showing degenerative changes with mild osteoarthritis without herniated disc.  Advised continued conservative management with heating pads and physical therapy.  Patient will set up physical therapy appointment in follow-up in 4 to 6 weeks    [name], DO PGY-3, [facility] 08/05/2018 8:45 AM

## 2018-08-05 NOTE — Patient Instructions (Signed)
For your diabetes - restart metformin at 500mg  twice a day - new OneTouch glucometer was ordered based on your insurance - check your sugars 2-3 tiems per day and return in 1 month for diabetes follow up  For your blood pressure - keep taking hydrochlorothiazide 12.5mg  daily - increase your clonidine to 0.2mg  twice a day  For your back - contact physical therapy to get your back pain better

## 2018-08-05 NOTE — Assessment & Plan Note (Signed)
Likely due to muscle strain after mowing his lawn.  Plain films of lumbar spine showing degenerative changes with mild osteoarthritis without herniated disc.  Advised continued conservative management with heating pads and physical therapy.  Patient will set up physical therapy appointment in follow-up in 4 to 6 weeks

## 2018-08-05 NOTE — Assessment & Plan Note (Signed)
Uncontrolled.  Continue hydrochlorothiazide 12.5 mg daily.  Clonidine is unusual choice for this age group however he has been on it for a while and tolerating well, says it is usually a twice daily medication discussed trial of of 0.2 mg twice daily.  Will up in 1 month.  If still uncontrolled then would likely change from clonidine to an ACE or ARB given his diabetes and good kidney function.

## 2018-08-17 ENCOUNTER — Ambulatory Visit: Payer: PPO | Attending: Obstetrics & Gynecology | Admitting: Physical Therapy

## 2018-08-17 ENCOUNTER — Encounter: Payer: Self-pay | Admitting: Physical Therapy

## 2018-08-17 DIAGNOSIS — M5441 Lumbago with sciatica, right side: Secondary | ICD-10-CM

## 2018-08-17 DIAGNOSIS — M5442 Lumbago with sciatica, left side: Secondary | ICD-10-CM | POA: Insufficient documentation

## 2018-08-17 NOTE — Therapy (Signed)
[place], [place], [address] Phone: [phone]   Fax:  [phone]  Physical Therapy Evaluation  Patient Details  Name: Gregory Harrison MRN: [id number] Date of Birth: [birth year] Referring Provider: Bonnita Hollow, MD resident, Sherren Mocha McDiarmid MD   Encounter Date: 08/17/2018  PT End of Session - 08/17/18 1305    Visit Number  1    Number of Visits  4    Date for PT Re-Evaluation  09/14/18    PT Start Time  2831    PT Stop Time  1230    PT Time Calculation (min)  45 min    Activity Tolerance  Patient tolerated treatment well    Behavior During Therapy  [facility] for tasks assessed/performed       Past Medical History:  Diagnosis Date  . Arthritis    2010  . Diabetes ([place]) 2000  . Diabetic neuropathy ([place])   . HTN (hypertension) 2010  . Hypercholesterolemia   . 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 45, blurry vision    Past Surgical History:  Procedure Laterality Date  . EYE SURGERY Left 1990s   " to peel off a growth"  . HAND SURGERY  1980s   R hand pointer finger   . [place]   after broke his nose    There were no vitals filed for this visit.   Subjective Assessment - 08/17/18 1248    Subjective  Pt relays LBP with pain down his right leg that started up in July. 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 He also relays he had a tick bite around this time and was diagnosed with RMSF or lyme disease which may be contributing to symptoms. He also relays pain is improving slowly    Pertinent History  PMH: DM,HTN,Rt eye poor vision    Limitations  Sitting;Walking;Lifting    How long can you sit comfortably?  one hour but pain is more when he then goes to stand up    How long can you walk comfortably?  5 min    Diagnostic tests  x-ray back/pelvis: DDD more pronounced at L5-S1 than L4-5. Mild OA of  both hip joints. Mild OA of the Lt SI jt.    Patient Stated Goals  Less pain so I can do more    Currently in Pain?  Yes    Pain Score  6     Pain Location  Back    Pain Orientation  Right;Left    Pain Descriptors / Indicators  Aching;Tightness;Numbness    Pain Type  Acute pain    Pain Radiating Towards  Rt LE    Pain Onset  More than a month ago    Pain Frequency  Intermittent    Aggravating Factors   sit to stand, prolonged walking, lifting and yardwork    Pain Relieving Factors  heat, meds    Multiple Pain Sites  No         [facility] PT Assessment - 08/17/18 0001      Assessment   Medical Diagnosis  LBP with Rt radic.    Referring Provider  Bonnita Hollow, MD resident, Sherren Mocha McDiarmid MD    Onset Date/Surgical Date  --   July 2017   Next MD Visit  ?    Prior Therapy  none  Precautions   Precautions  None      Balance Screen   Has the patient fallen in the past 6 months  No      [place] residence      Prior Function   Level of Independence  Independent    Vocation  Part time employment    Vocation Requirements  house and yardwork    Leisure  fish      Cognition   Overall Cognitive Status  Within Functional Limits for tasks assessed      Observation/Other Assessments   Focus on Therapeutic Outcomes (FOTO)   67% limit      Sensation   Light Touch  Appears Intact      Posture/Postural Control   Posture Comments  slight ant pelvic tilt      ROM / Strength   AROM / PROM / Strength  AROM;Strength      AROM   AROM Assessment Site  Lumbar    Lumbar Flexion  75%    Lumbar Extension  25%    Lumbar - Right Side Bend  50%    Lumbar - Left Side Bend  50%    Lumbar - Right Rotation  50%    Lumbar - Left Rotation  50%      Strength   Overall Strength Comments  WFL LE strength, poor core strength      Flexibility   Soft Tissue Assessment /Muscle Length  --   tight lumbar P.S, glutes, H.S     Palpation   Spinal  mobility  hypombobility lumbar-thoracic    Palpation comment  TTP lumbar spine      Special Tests   Other special tests  +slump and SLR on Rt, +FABERs bilat                Objective measurements completed on examination: See above findings.      [facility] Adult PT Treatment/Exercise - 08/17/18 0001      Self-Care   Self-Care  --   HEP edu, ice/heat/tens Edu     Modalities   Modalities  Electrical Stimulation;Moist Heat      Moist Heat Therapy   Number Minutes Moist Heat  15 Minutes    Moist Heat Location  Lumbar Spine      Electrical Stimulation   Electrical Stimulation Location  lumbar    Electrical Stimulation Action  IFC    Electrical Stimulation Parameters  tolerance    Electrical Stimulation Goals  Pain;Tone             PT Education - 08/17/18 1304    Education Details  HEP, POC, TENS    Person(s) Educated  Patient    Methods  Explanation;Demonstration;Verbal cues;Handout    Comprehension  Verbalized understanding;Need further instruction          PT Long Term Goals - 08/17/18 1311      PT LONG TERM GOAL #1   Title  Pt will be I and compliant with HEP. 4 weeks 09/14/18    Status  New      PT LONG TERM GOAL #2   Title  Pt will improve FOTO to less than 46%limited. 4 weeks 09/14/18    Status  New      PT LONG TERM GOAL #3   Title  Pt will improve lumbar ROM to [facility]. 4 weeks 09/14/18    Status  New      PT LONG TERM GOAL #  4   Title  Pt will report less than 3/10 overall pain with usual activities. 4 weeks 09/14/18    Status  New             Plan - 08/17/18 1305    Clinical Impression Statement  Pt presents with acute LBP and Rt radiculopathy. He presents with signs and symptoms consistent with disc pathologhy but also lumbar strain. He does have some OA and DDD on recent x-ray. He has decreased ROM, decreased core strength and neuromuscular control, increased tightness, and increased pain limiting his full function. He will benefit from  skilled PT to address these deficits.    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    PT Frequency  1x / week    PT Duration  4 weeks    PT Treatment/Interventions  Cryotherapy;Electrical Stimulation;Iontophoresis 4mg /ml Dexamethasone;Moist Heat;Traction;Ultrasound;Gait training;Therapeutic exercise;Therapeutic activities;Neuromuscular re-education;Manual techniques;Passive range of motion;Taping    PT Next Visit Plan  review HEP, core, stretching, modalites and MT PRN    Consulted and Agree with Plan of Care  Patient       Patient will benefit from skilled therapeutic intervention in order to improve the following deficits and impairments:  Decreased activity tolerance, Decreased endurance, Decreased range of motion, Decreased strength, Impaired flexibility, Increased muscle spasms, Postural dysfunction, Pain  Visit Diagnosis: Acute right-sided low back pain with right-sided sciatica     Problem List Patient Active Problem List   Diagnosis Date Noted  . Acute right-sided low back pain 07/31/2018  . Carpal tunnel syndrome of right wrist 06/04/2018  . Trigger finger of right thumb 06/04/2018  . Chronic left shoulder pain 02/26/2018  . Hypercholesterolemia   . Diabetic neuropathy ([place])   . Arthritis   . HTN (hypertension) 12/09/2008  . Diabetes ([place]) 12/09/1998    Debbe Odea, PT, DPT 08/17/2018, 1:15 PM  [facility] [address] [place], [place], [address] Phone: [phone]   Fax:  [phone]  Name: Gregory Harrison MRN: [id number] Date of Birth: 06-Jan-1951

## 2018-08-26 ENCOUNTER — Ambulatory Visit: Payer: PPO | Admitting: Physical Therapy

## 2018-08-26 DIAGNOSIS — M5442 Lumbago with sciatica, left side: Secondary | ICD-10-CM

## 2018-08-26 DIAGNOSIS — M5441 Lumbago with sciatica, right side: Secondary | ICD-10-CM | POA: Diagnosis not present

## 2018-08-26 NOTE — Therapy (Signed)
[place], [place], [address] Phone: [phone]   Fax:  [phone]  Physical Therapy Treatment  Patient Details  Name: Gregory Harrison MRN: [id number] Date of Birth: 1951-09-06 Referring Provider: Bonnita Hollow, MD resident, Sherren Mocha McDiarmid MD   Encounter Date: 08/26/2018  PT End of Session - 08/26/18 1407    Visit Number  2    Number of Visits  4    Date for PT Re-Evaluation  09/14/18    PT Start Time  1330    PT Stop Time  1416   last 8 min on heat   PT Time Calculation (min)  46 min    Activity Tolerance  Patient tolerated treatment well    Behavior During Therapy  [facility] for tasks assessed/performed       Past Medical History:  Diagnosis Date  . Arthritis    2010  . Diabetes ([place]) 2000  . Diabetic neuropathy ([place])   . HTN (hypertension) 2010  . Hypercholesterolemia   . 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 6, blurry vision    Past Surgical History:  Procedure Laterality Date  . EYE SURGERY Left 1990s   " to peel off a growth"  . HAND SURGERY  1980s   R hand pointer finger   . [place]   after broke his nose    There were no vitals filed for this visit.  Subjective Assessment - 08/26/18 1344    Subjective  Pt relays LBP and pain down Rt leg have resolved but now the pain has moved over to his Lt hip and groin area and his Lt leg feels like it will give out on him when he is walking.    Currently in Pain?  Yes    Pain Score  10-Worst pain ever    Pain Location  Hip    Pain Orientation  Left    Pain Descriptors / Indicators  [facility] Adult PT Treatment/Exercise - 08/26/18 0001      Ambulation/Gait   Ambulation Distance (Feet)  220 Feet    Gait Comments  less Lt hip pain with ambulaiton after MT      Exercises   Exercises  Lumbar;Knee/Hip      Lumbar Exercises: Stretches   Passive Hamstring Stretch  2 reps;30  seconds;Left    Prone on Elbows Stretch  --   3 min   Piriformis Stretch  Left;2 reps;30 seconds    Other Lumbar Stretch Exercise  hip adductor and hip flexor/quad stretch 30 sec X 2 ea supine with strap    Other Lumbar Stretch Exercise  standing lumbar ext X 10      Lumbar Exercises: Supine   Straight Leg Raise  10 reps      Lumbar Exercises: Sidelying   Hip Abduction  10 reps      Moist Heat Therapy   Number Minutes Moist Heat  8 Minutes    Moist Heat Location  Hip      Manual Therapy   Manual therapy comments  Lt hip mobs (distraction, inf mobs, lateral mobs grade 2-3) STM to hip fllexor,add,piriformis             PT Education - 08/26/18 1407    Education Details  new stretches and exercises for Lt hip, lumbar extension  exercises to trial     Methods  Explanation;Demonstration;Verbal cues    Comprehension  Verbalized understanding;Need further instruction          PT Long Term Goals - 08/17/18 1311      PT LONG TERM GOAL #1   Title  Pt will be I and compliant with HEP. 4 weeks 09/14/18    Status  New      PT LONG TERM GOAL #2   Title  Pt will improve FOTO to less than 46%limited. 4 weeks 09/14/18    Status  New      PT LONG TERM GOAL #3   Title  Pt will improve lumbar ROM to [facility]. 4 weeks 09/14/18    Status  New      PT LONG TERM GOAL #4   Title  Pt will report less than 3/10 overall pain with usual activities. 4 weeks 09/14/18    Status  New            Plan - 08/26/18 1408    Clinical Impression Statement  Pt today had no Rt radicular symptoms but now has Lt hip/groin symptoms. These still could come from bulging disc so extension based exercises where performed along with jt mobs and STM which did help decrease pain with ambulation after session. His pain does not appear to be hip flexor or adductor strain as he had no pain with AROM against gravity with these and pain is only with WB. He was instructed to see MD if his Lt hip and groin do not improve  over next few days.     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    PT Frequency  1x / week    PT Duration  4 weeks    PT Treatment/Interventions  Cryotherapy;Electrical Stimulation;Iontophoresis 4mg /ml Dexamethasone;Moist Heat;Traction;Ultrasound;Gait training;Therapeutic exercise;Therapeutic activities;Neuromuscular re-education;Manual techniques;Passive range of motion;Taping    PT Next Visit Plan  extension based exercises, hip jt mobs, LE stretching and core       Patient will benefit from skilled therapeutic intervention in order to improve the following deficits and impairments:  Decreased activity tolerance, Decreased endurance, Decreased range of motion, Decreased strength, Impaired flexibility, Increased muscle spasms, Postural dysfunction, Pain  Visit Diagnosis: Acute right-sided low back pain with right-sided sciatica  Acute left-sided low back pain with bilateral sciatica     Problem List Patient Active Problem List   Diagnosis Date Noted  . Acute right-sided low back pain 07/31/2018  . Carpal tunnel syndrome of right wrist 06/04/2018  . Trigger finger of right thumb 06/04/2018  . Chronic left shoulder pain 02/26/2018  . Hypercholesterolemia   . Diabetic neuropathy ([place])   . Arthritis   . HTN (hypertension) 12/09/2008  . Diabetes ([place]) 12/09/1998    Debbe Odea, PT, DPT 08/26/2018, 2:13 PM  [facility] [address] [place], [place], [address] Phone: [phone]   Fax:  [phone]  Name: Gregory Harrison MRN: [id number] Date of Birth: [birth year]

## 2018-08-31 ENCOUNTER — Ambulatory Visit: Payer: PPO | Admitting: Family Medicine

## 2018-08-31 ENCOUNTER — Ambulatory Visit: Payer: PPO | Admitting: Physical Therapy

## 2018-08-31 DIAGNOSIS — M5441 Lumbago with sciatica, right side: Secondary | ICD-10-CM

## 2018-08-31 DIAGNOSIS — M5442 Lumbago with sciatica, left side: Secondary | ICD-10-CM

## 2018-08-31 NOTE — Therapy (Signed)
[place], [place], [address] Phone: [phone]   Fax:  [phone]  Physical Therapy Treatment/Discharge  Patient Details  Name: Gregory Harrison MRN: [id number] Date of Birth: 11-15-1951 Referring Provider: Grandville Silos MD resident   Encounter Date: 08/31/2018  PT End of Session - 08/31/18 1143    Visit Number  3    Number of Visits  3    Date for PT Re-Evaluation  09/14/18    PT Start Time  1100    PT Stop Time  1140    PT Time Calculation (min)  40 min    Activity Tolerance  Patient tolerated treatment well    Behavior During Therapy  [facility] for tasks assessed/performed       Past Medical History:  Diagnosis Date  . Arthritis    2010  . Diabetes ([place]) 2000  . Diabetic neuropathy ([place])   . HTN (hypertension) 2010  . Hypercholesterolemia   . 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 58, blurry vision    Past Surgical History:  Procedure Laterality Date  . EYE SURGERY Left 1990s   " to peel off a growth"  . HAND SURGERY  1980s   R hand pointer finger   . [place]   after broke his nose    There were no vitals filed for this visit.  Subjective Assessment - 08/31/18 1124    Subjective  Pt relays no pain today, no complaints, and he feels ready for D/C.    Currently in Pain?  No/denies         [facility] PT Assessment - 08/31/18 0001      Assessment   Medical Diagnosis  LBP with Rt radic.    Referring Provider  Grandville Silos MD resident      Observation/Other Assessments   Focus on Therapeutic Outcomes (FOTO)   5% limited      AROM   Lumbar Flexion  WFL    Lumbar Extension  WNL    Lumbar - Right Side Bend  WNL    Lumbar - Left Side Bend  WNL    Lumbar - Right Rotation  WNL    Lumbar - Left Rotation  WNL      Strength   Overall Strength Comments  WNL LE strength                   [facility] Adult PT Treatment/Exercise - 08/31/18 0001      Exercises   Exercises  Lumbar       Lumbar Exercises: Stretches   Passive Hamstring Stretch  2 reps;30 seconds;Left    Single Knee to Chest Stretch  2 reps;30 seconds    Prone on Elbows Stretch  Other (comment)   3 min   Piriformis Stretch  Left;2 reps;30 seconds    Other Lumbar Stretch Exercise  standing lumbar ext X 10      Lumbar Exercises: Supine   Bent Knee Raise  20 reps    Bridge  20 reps;5 seconds    Straight Leg Raise  20 reps      Manual Therapy   Manual therapy comments  hip distraciton mobs Gr 2-3 5 bouts ea                  PT Long Term Goals - 08/31/18 1144      PT LONG TERM GOAL #1   Title  Pt will be I and compliant  with HEP. 4 weeks 09/14/18    Status  Achieved      PT LONG TERM GOAL #2   Title  Pt will improve FOTO to less than 46%limited. 4 weeks 09/14/18    Status  Achieved      PT LONG TERM GOAL #3   Title  Pt will improve lumbar ROM to [facility]. 4 weeks 09/14/18    Status  Achieved      PT LONG TERM GOAL #4   Title  Pt will report less than 3/10 overall pain with usual activities. 4 weeks 09/14/18    Status  Achieved            Plan - 08/31/18 1143    Clinical Impression Statement  Pt had no pain or complaints, has met all PT goals, and now has WNL ROM and strength. He will be D/C to HEP due to progress and he had no further questions or concerns regarding DC.    PT Next Visit Plan  D/C this visit    Consulted and Agree with Plan of Care  Patient       Patient will benefit from skilled therapeutic intervention in order to improve the following deficits and impairments:     Visit Diagnosis: Acute right-sided low back pain with right-sided sciatica  Acute left-sided low back pain with bilateral sciatica     Problem List Patient Active Problem List   Diagnosis Date Noted  . Acute right-sided low back pain 07/31/2018  . Carpal tunnel syndrome of right wrist 06/04/2018  . Trigger finger of right thumb 06/04/2018  . Chronic left shoulder pain 02/26/2018  .  Hypercholesterolemia   . Diabetic neuropathy ([place])   . Arthritis   . HTN (hypertension) 12/09/2008  . Diabetes ([place]) 12/09/1998    Debbe Odea, PT, DPT 08/31/2018, 11:45 AM  [facility] [address] [place], [place], [address] Phone: [phone]   Fax:  [phone]  Name: Gregory Harrison MRN: [id number] Date of Birth: 1951/01/08 PHYSICAL THERAPY DISCHARGE SUMMARY  Visits from Start of Care: 3  Current functional level related to goals / functional outcomes: See above   Remaining deficits: See above   Education / Equipment: Updated and reviewed final HEP Plan: Patient agrees to discharge.  Patient goals were met. Patient is being discharged due to meeting the stated rehab goals.  ?????   Elsie Ra, PT, DPT 08/31/18 11:46 AM

## 2018-09-07 ENCOUNTER — Ambulatory Visit: Payer: PPO | Admitting: Physical Therapy

## 2018-09-21 ENCOUNTER — Other Ambulatory Visit: Payer: Self-pay | Admitting: *Deleted

## 2018-09-21 MED ORDER — GABAPENTIN 300 MG PO CAPS: ORAL_CAPSULE | ORAL | 2 refills | Status: DC

## 2018-10-01 ENCOUNTER — Other Ambulatory Visit: Payer: Self-pay

## 2018-10-01 ENCOUNTER — Ambulatory Visit (INDEPENDENT_AMBULATORY_CARE_PROVIDER_SITE_OTHER): Payer: PPO | Admitting: Family Medicine

## 2018-10-01 ENCOUNTER — Encounter: Payer: Self-pay | Admitting: Family Medicine

## 2018-10-01 VITALS — BP 132/70 | HR 50 | Temp 97.4°F | Wt 190.0 lb

## 2018-10-01 DIAGNOSIS — E1142 Type 2 diabetes mellitus with diabetic polyneuropathy: Secondary | ICD-10-CM

## 2018-10-01 DIAGNOSIS — E1149 Type 2 diabetes mellitus with other diabetic neurological complication: Secondary | ICD-10-CM

## 2018-10-01 DIAGNOSIS — Z23 Encounter for immunization: Secondary | ICD-10-CM

## 2018-10-01 DIAGNOSIS — E118 Type 2 diabetes mellitus with unspecified complications: Secondary | ICD-10-CM

## 2018-10-01 LAB — POCT GLYCOSYLATED HEMOGLOBIN (HGB A1C): HbA1c, POC (controlled diabetic range): 11.6 % — AB (ref 0.0–7.0)

## 2018-10-01 MED ORDER — INSULIN NPH (HUMAN) (ISOPHANE) 100 UNIT/ML ~~LOC~~ SUSP: SUBCUTANEOUS | 3 refills | Status: DC

## 2018-10-01 MED ORDER — CANAGLIFLOZIN 300 MG PO TABS: 300.0000 mg | ORAL_TABLET | Freq: Every day | ORAL | 0 refills | Status: DC

## 2018-10-01 NOTE — Progress Notes (Signed)
    Subjective:  Gregory Harrison is a 67 y.o. male who presents to the [facility] today for diabetes follow up  HPI:  SUBJECTIVE: 67 y.o. male for follow up of diabetes.  Medication compliance: compliant most of the time. Taking metformin 500mg  BID without issues, he thinks that the 1000mg  was too much for him bfeore because it made him feel like he was going to pass out but maybe he just needed his body to get used to it. Doing novolin N 25U BID. Was not able to afford the invokana because it was $300-400. diabetic diet compliance: compliant most of the time,  home glucose monitoring: is performed regularly - ranging from 147-418. Averaging 275-298. States sugars have been worse lately because he was sick. Further diabetic ROS: no polyuria or polydipsia, no chest pain, dyspnea or TIA's, no hypoglycemia, no medication side effects noted, has dysesthesias in the feet for which he has been taking 600mg  gabapentin at night   ROS: Per HPI  Social Hx: He reports that he has quit smoking. He has a 40.00 pack-year smoking history. He has never used smokeless tobacco. He reports that he has current or past drug history. Drug: Marijuana. He reports that he does not drink alcohol.   Objective:  Physical Exam: BP 132/70   Pulse (!) 50   Temp (!) 97.4 F (36.3 C) (Oral)   Wt 190 lb (86.2 kg)   SpO2 99%   BMI 23.75 kg/m   Gen: NAD, resting comfortably CV: RRR with no murmurs appreciated Pulm: NWOB, CTAB with no crackles, wheezes, or rhonchi GI: Normal bowel sounds present. Soft, Nontender, Nondistended. MSK: no edema, cyanosis, or clubbing noted Skin: warm, dry Neuro: grossly normal, moves all extremities Psych: Normal affect and thought content  Results for orders placed or performed in visit on 10/01/18 (from the past 72 hour(s))  HgB A1c     Status: Abnormal   Collection Time: 10/01/18  9:47 AM  Result Value Ref Range   Hemoglobin A1C     HbA1c POC (<> result, manual entry)     HbA1c, POC  (prediabetic range)     HbA1c, POC (controlled diabetic range) 11.6 (A) 0.0 - 7.0 %     Assessment/Plan:  Diabetes (HCC) Uncontrolled. a1c is worse today at 11.6 which is likely worsening his diabetic neuropathy. He would like a pain management referral today for this, which was ordered. In regards to his diabetes control. Discussed retrial of metformin to 1000mg  BID. Resent invokana to patient new pharmacy which will hopefully be covered. Increase am novolin N to 30U and kept evening dose at 25U. Patient will follow up with pharmacy clinic in 1-2 weeks for insulin titration. He is very resistant to being on a longer acting with mealtimes coverage because of cost issues he has had in the past.    [name], DO PGY-3, [facility] 10/01/2018 9:54 AM

## 2018-10-01 NOTE — Patient Instructions (Addendum)
It was good to see you today!  For your diabetes, - I will call your pharmacy about the invokana - increase your novolin to 30U in the morning and keep at 25 U in the evening - try increased metformin 1000mg  twice a day, if you have side effects then you can decrease back to 500mg  BID   Please check-out at the front desk before leaving the clinic. Make an appointment with pharmacy clinic in 1-2 weeks. Come back and see me in 3 months.    Please bring all of your medications with you to each visit.   Sign up for My Chart to have easy access to your labs results, and communication with your primary care physician.  Feel free to call with any questions or concerns at any time, at [phone].   Take care,  Dr. [name], [place]

## 2018-10-01 NOTE — Assessment & Plan Note (Signed)
Uncontrolled. a1c is worse today at 11.6 which is likely worsening his diabetic neuropathy. He would like a pain management referral today for this, which was ordered. In regards to his diabetes control. Discussed retrial of metformin to 1000mg  BID. Resent invokana to patient new pharmacy which will hopefully be covered. Increase am novolin N to 30U and kept evening dose at 25U. Patient will follow up with pharmacy clinic in 1-2 weeks for insulin titration. He is very resistant to being on a longer acting with mealtimes coverage because of cost issues he has had in the past.

## 2018-10-22 ENCOUNTER — Encounter: Payer: Self-pay | Admitting: Pharmacist

## 2018-10-22 ENCOUNTER — Ambulatory Visit (INDEPENDENT_AMBULATORY_CARE_PROVIDER_SITE_OTHER): Payer: PPO | Admitting: Pharmacist

## 2018-10-22 VITALS — BP 152/82 | HR 64 | Ht 74.0 in | Wt 190.0 lb

## 2018-10-22 DIAGNOSIS — E1142 Type 2 diabetes mellitus with diabetic polyneuropathy: Secondary | ICD-10-CM | POA: Diagnosis not present

## 2018-10-22 MED ORDER — METFORMIN HCL ER 500 MG PO TB24: 1000.0000 mg | ORAL_TABLET | Freq: Two times a day (BID) | ORAL | 3 refills | Status: DC

## 2018-10-22 MED ORDER — GABAPENTIN 300 MG PO CAPS: 300.0000 mg | ORAL_CAPSULE | Freq: Three times a day (TID) | ORAL | 2 refills | Status: DC

## 2018-10-22 MED ORDER — INSULIN DEGLUDEC 200 UNIT/ML ~~LOC~~ SOPN: 50.0000 [IU] | PEN_INJECTOR | Freq: Every day | SUBCUTANEOUS | 0 refills | Status: DC

## 2018-10-22 NOTE — Progress Notes (Signed)
Patient ID: Gregory Harrison, male   DOB: 18-Oct-1951, 67 y.o.   MRN: [id number] Reviewed: Agree with Dr. Graylin Shiver documentation and management.

## 2018-10-22 NOTE — Progress Notes (Signed)
S:     Chief Complaint  Patient presents with  . Medication Management    Diabetes    Patient arrives in good spirits, ambulating without assistance but walking with a limp and complaining of pain.  Presents for diabetes evaluation, education, and management at the request of Dr. Shawna Orleans at last visit on 10/01/2018. At that time, his insulin NPH dose was increased and Invokana was re-prescribed to his pharmacy.   He describes significant pain in his left thigh/groin area, and notes that it has been worse over the past 3 weeks. He denies any trauma to the area, and denies any falls. He also endorses significant issues with peripheral neuropathy, describing burning pain in his feet and recent dull pain in his right hand.   Today, he notes that he started Invokana 300 mg ~2 weeks ago. He recalls having "dizziness" issues with immediate release metformin 2000 mg previously, so he had reduced the dose and is now on 500 mg BID. Today, he notes that the $45 copay for Invokana is difficult for him.    Insurance coverage/medication affordability: [employer] Advantage Plan 1  Patient reports adherence with medications.  Current diabetes medications include: metformin IR 500 mg BID, Invokana 300 mg daily, insulin NPH 25 units QAM, 30 units QPM Current hypertension medications include: clondine 0.2 mg BID, HCTZ 12.5 mg daily   Patient denies hypoglycemic events; previously with low blood sugars, he experienced hx cold sweats, nervous, shaking    Patient reports nocturia 1x/night Patient reports neuropathy in feet and hands Patient denies visual changes. Patient reports self foot exams.     O:  Physical Exam  Constitutional: He appears well-developed and well-nourished.     Review of Systems  Musculoskeletal: Positive for back pain.       Thigh/groin pain     Lab Results  Component Value Date   HGBA1C 11.6 (A) 10/01/2018   Vitals:   10/22/18 0902  BP: (!) 152/82  Pulse: 64    SpO2: 98%    Lipid Panel     Component Value Date/Time   CHOL 200 (H) 02/24/2018 1613   TRIG 129 02/24/2018 1613   HDL 50 02/24/2018 1613   CHOLHDL 4.0 02/24/2018 1613   LDLCALC 124 (H) 02/24/2018 1613   LDLDIRECT 134 (H) 02/24/2018 1613    Home fasting CBG: all >200 2 hour post-prandial/random CBG: all >200  Clinical ASCVD: No  10 year ASCVD risk: 43.5%   A/P: Groin/Thigh Pain - Precepted with Dr. Owens Shark. She saw patient, reviewed results of 07/31/2018 pelvis imaging. She recommended that patient f/u with PCP Dr. Shawna Orleans as soon as possible for further management  Diabetes longstanding currently uncontrolled. Patient is able to verbalize appropriate hypoglycemia management plan. Patient is adherent with medication. Control is suboptimal due to insulin resistance, medication affordability concerns. - Change to metformin ER formulation. Gave patient instructions to titrate to 2000 mg daily, as tolerated.  - Discontinue insulin NPH. Start Tresiba (insulin degludec) 200 units/mL 50 units once daily. Provided with samples. - Continue Invokana (canagliflozin) 300 mg daily at this time. Patient may qualify for Jardiance (empagliflozin) patient assistance, as there is no required out of pocket spend. Started the application process today; will reach out to Dr. Shawna Orleans for signature. If approved, can switch from [place] to [place].  -Moving forward, work with patient on dietary modifications that can be made to improve glycemic control. -Counseled on s/sx of and management of hypoglycemia  Chronic Neuropathic pain; uncontrolled, worsened recently,  likely as a result of uncontrolled diabetes - Precepted with Dr. Owens Shark. Increased gabapentin to 300 mg TID. Counseled patient on daytime sedation and to avoid operating heavy machinery until determining how the medication effects him  ASCVD risk - primary prevention in patient with DM. Last LDL is not controlled. ASCVD risk score is >20%  - high  intensity statin indicated. - Moving forward, consider initiation of high intensity statin  Hypertension longstanding currently uncontrolled.  BP goal = 140/90 mmHg. Patient is adherent with medication. Control is suboptimal due to concurrent pain. - Continue to monitor. Moving forward, consider addition of ACEi/ARB for nephroprotection in diabetes  Written patient instructions provided.  Total time in face to face counseling 60 minutes.   Follow up Pharmacist or PCP Clinic Visit in 4 weeks.   Patient seen with Harrietta Guardian, PharmD, PGY1 Pharmacy Resident and [name], PharmD,  PGY2 Pharmacy Resident.    ,

## 2018-10-22 NOTE — Patient Instructions (Addendum)
It was great to see you today!   We are going to make a few changes:  1) We are changing you to metformin extended release. Start by taking 1 tablet (500 mg) in the morning and 1 tablets (500 mg) in the evening, then increase to 2 tablets in the morning and 2 in the evening as tolerated.  2) Stop the insulin NPH. Start Tresiba (insulin degludec) 50 units once daily.  3) Increase gabapentin to 1 capsule (300 mg) three times daily. This may make you more sleepy.   Keep checking your blood sugar 1) when you wake up in the morning and 2) about 2 hours after the largest meal of you day.    Schedule follow up with pharmacy clinic or Dr. Shawna Orleans in about 4-6 weeks. Follow up with Dr. Shawna Orleans as soon as possible for your hip/groin pain.   I'll be in touch regarding the patient assistance application. My number is [phone]- [place], PharmD

## 2018-10-22 NOTE — Assessment & Plan Note (Signed)
Diabetes longstanding currently uncontrolled. Patient is able to verbalize appropriate hypoglycemia management plan. Patient is adherent with medication. Control is suboptimal due to insulin resistance, medication affordability concerns. - Change to metformin ER formulation. Gave patient instructions to titrate to 2000 mg daily, as tolerated.  - Discontinue insulin NPH. Start Tresiba (insulin degludec) 200 units/mL 50 units once daily. Provided with samples. - Continue Invokana (canagliflozin) 300 mg daily at this time. Patient may qualify for Jardiance (empagliflozin) patient assistance, as there is no required out of pocket spend. Started the application process today; will reach out to Dr. Shawna Orleans for signature. If approved, can switch from [place] to [place].  -Moving forward, work with patient on dietary modifications that can be made to improve glycemic control. -Counseled on s/sx of and management of hypoglycemia

## 2018-10-22 NOTE — Assessment & Plan Note (Signed)
Chronic Neuropathic pain; uncontrolled, worsened recently, likely as a result of uncontrolled diabetes - Precepted with Dr. Owens Shark. Increased gabapentin to 300 mg TID. Counseled patient on daytime sedation and to avoid operating heavy machinery until determining how the medication effects him

## 2018-10-23 ENCOUNTER — Other Ambulatory Visit: Payer: Self-pay | Admitting: Pharmacy Technician

## 2018-10-23 ENCOUNTER — Other Ambulatory Visit: Payer: Self-pay | Admitting: Pharmacist

## 2018-10-23 NOTE — Patient Outreach (Signed)
[place] [facility]) Care Management  10/23/2018  Gregory Harrison 08-26-1951 [id number]  Received patient assistance referral from [place] for [employer] for [employer].  Received all necessary docuemtns and signautres from both patient and provider. Submitted completed application to [employer] for processing.  Will followup in 10-14 business days to check on the status of the application.  Vanetta Rule P. Terryon Pineiro, [place] Management [phone]

## 2018-10-23 NOTE — Patient Outreach (Signed)
Gregory Harrison) Care Management  [place]   10/23/2018  Gregory Harrison 1951-02-02 [id number]  Reason for referral: Medication Assistance  Referral source: PCP/PharmD Referral medication(s): Jardiance Current insurance: [employer] Advantage Plan 1  PMHx: Diabetes, HTN, chronic pain   HPI: Patient presented yesterday to appointment with clinical pharmacist at [facility]; it was noted that he was having a hard time affording his $45 copay for Invokana in combination with his other healthcare expenditures.   Objective: No Known Allergies  Medications Reviewed Today    Reviewed by Leavy Cella, [facility] (Pharmacist) on 10/22/18 at [phone]  Med List Status: <None>  Medication Order Taking? Sig Documenting Provider Last Dose Status Informant  acetaminophen (TYLENOL) 500 MG tablet [id number] Yes Take 1 tablet (500 mg total) by mouth every 6 (six) hours as needed. [name], DO Taking Active   Alcohol Swabs PADS [id number]  Use with insulin [name], DO  Active   Blood Glucose Monitoring Suppl (ONETOUCH VERIO) w/Device KIT [id number] Yes 1 each by Does not apply route as directed. [name], DO Taking Active   canagliflozin [facility]) 300 MG TABS tablet [id number] Yes Take 1 tablet (300 mg total) by mouth daily before breakfast. [name], DO Taking Active   cloNIDine (CATAPRES) 0.2 MG tablet [id number] Yes Take 1 tablet (0.2 mg total) by mouth 2 (two) times daily. [name], DO Taking Active   Elastic Bandages & Supports (WRIST SPLINT/COCK-UP/RIGHT L) MISC [id number]  Wear nightly [name], DO  Active   gabapentin (NEURONTIN) 300 MG capsule [id number] Yes TAKE ONE CAPSULE (381m) BY MOUTH AT BEDTIME AS NEEDED FOR burning FEET YBufford Lope DO Taking Active            Med Note (Mariana KaufmanNov 14, 2019  8:48 AM) Taking every evening   glucose blood ([facility]) test strip [id number] Use as instructed YBufford Lope DO  Active    hydrochlorothiazide (MICROZIDE) 12.5 MG capsule [id number] Take 1 capsule by mouth daily. [provider] Taking Active         Discontinued 10/22/18 0848 (Completed Course)   insulin NPH Human (NOVOLIN N) 100 UNIT/ML injection [id number] 30U in the morning, and 25U in the evening YBufford Lope DO Taking Active            Med Note (Valentina Lucks PSabas SousNov 14, 2019  8:48 AM) 25 QAM, 30 QPM  Lancet Devices (ONE TOUCH DELICA LANCING DEV) MISC [id number] 1 each by Does not apply route as directed. YBufford Lope DO  Active   metFORMIN (GLUCOPHAGE) 500 MG tablet [id number] Take 1 tablet (500 mg total) by mouth 2 (two) times daily with a meal. YBufford Lope DO Taking Active   naproxen sodium (ALEVE) 220 MG tablet [id number] Take 220 mg by mouth as needed. [provider] Not Taking Active   [id number] 1 each by Does not apply route 3 (three) times daily. YBufford Lope DO Taking Active           Assessment:  Drugs sorted by system:  Cardiovascular: clonidine, HCTZ  Endocrine: ILavonna Rua metformin  Pain: acetaminophen, naproxen, gabapentin,   Medication Review Findings:  . Patient with diabetes, ASCVD risk score >20% qualifies for statin therapy for primary prevention. Consider working with patient to initiate at future visits  Medication Assistance  Findings:  Extra Help:   '[]'  Already receiving Full Extra Help  '[]'  Already receiving Partial Extra Help  '[]'  Eligible based on reported income and assets  '[x]'  Not Eligible based on reported income and assets  Patient Assistance Programs: 1) Jardiance made by [employer] o Income requirement met: '[]'  Yes '[]'  No '[x]'  Unknown o Out-of-pocket prescription expenditure met:    '[]'  Yes '[]'  No  '[]'  Unknown  '[x]'  Not applicable - Patient has NOT met out of pocket spend requirement for Invokana [employer]), but there is not an out of pocket spend for [place] through  [employer]. Spoke with PCP Dr. Orson Eva; agreed to pursue Patient Assistance for [place]. If patient is approved, will switch from Invokana 300 mg daily to Jardiance 25 mg daily  Plan: - Patient signature and provider signature obtained, application completed. Will forward materials to [employer], CPhT for processing, submission, and follow up.   [name], PharmD PGY2 Ambulatory Care Pharmacy Resident, [facility] Phone: [phone]

## 2018-10-26 ENCOUNTER — Ambulatory Visit (INDEPENDENT_AMBULATORY_CARE_PROVIDER_SITE_OTHER): Payer: PPO | Admitting: Family Medicine

## 2018-10-26 ENCOUNTER — Encounter: Payer: Self-pay | Admitting: Family Medicine

## 2018-10-26 VITALS — BP 125/80 | HR 60 | Temp 97.8°F | Wt 190.4 lb

## 2018-10-26 DIAGNOSIS — I10 Essential (primary) hypertension: Secondary | ICD-10-CM

## 2018-10-26 DIAGNOSIS — E7800 Pure hypercholesterolemia, unspecified: Secondary | ICD-10-CM

## 2018-10-26 MED ORDER — ATORVASTATIN CALCIUM 40 MG PO TABS: 40.0000 mg | ORAL_TABLET | Freq: Every day | ORAL | 3 refills | Status: DC

## 2018-10-26 MED ORDER — LOSARTAN POTASSIUM 25 MG PO TABS: 25.0000 mg | ORAL_TABLET | Freq: Every day | ORAL | 2 refills | Status: DC

## 2018-10-26 NOTE — Progress Notes (Signed)
    Subjective:  Gregory Harrison is a 67 y.o. male who presents to the [facility] today with a chief complaint of hip pain.   HPI: Gregory Harrison came in with a chief complaint of pain which is related to his chronic osteoarthritis.  Today in clinic he noted that he has no hip pain at all and is feeling particularly well.  Yesterday, he was able to go for a three mile walk without issue.  Because he did not need to address his hip pain we instead focused on his chronic medical issues.  Hypertension: His primary hypertension has been controlled with clonidine and hydrochlorothiazide.  He was initially started on clonidine with a previous provider and was not trialed on other medication.  To his knowledge he has had no adverse reactions to other hypertensive medication (no angioedema/anaphylaxis).  He reports that he has been out of clonidine for the past 4 days.   Hyperlipidemia: He has not previously carried a diagnosis of hyperlipidemia but his lab results from 02/24/2017 showed total cholesterol 200 and LDL 124.  Previous notes have mentioned the need to start him on a statin in the future.  10/22/2018, is found to have a 10-year ASCVD risk of 43.5%.   Objective:  Physical Exam: BP 125/80 (BP Location: Left Arm, Patient Position: Sitting, Cuff Size: Normal)   Pulse 60   Temp 97.8 F (36.6 C) (Oral)   Wt 190 lb 6.4 oz (86.4 kg)   SpO2 98%   BMI 24.45 kg/m    Gen: NAD, resting comfortably HEENT: MMM, EOMI Resp: Normal respiratory effort on room air, no wheezing/stridor Neuro: grossly normal, moves all extremities Psych: Normal affect and thought content MSK: no gait abnormalities  No results found for this or any previous visit (from the past 72 hour(s)).   Assessment/Plan:  HTN (hypertension) According to Gregory Harrison, he has not taken his clonidine in the past 4 days does not appear to have experienced any rebound hypertension (or it has already resolved).  His blood pressure in clinic today  is appropriate though his past vitals suggest that this is an anomaly and that he does not fact need additional antihypertensive therapy.  Due to his diagnosis of diabetes instead start an ARB for renal protection. -Stop clonidine -Start Losartan 25 mg dialy -BMP in 1 week to check K and Cr  Hypercholesterolemia Given his significant medical comorbidities (i.e. Hypertension, diabetes, smoking) which would contribute to his high ASCVD risk, he would significantly benefit from a high intensity statin. -CMP (when he returns for lab work on 11/25) -Start atorvastatin 40 mg daily (starting in one week so there is less overlap with starting losartan) -repeat Lipid panel in 3 months

## 2018-10-26 NOTE — Patient Instructions (Signed)
I'm glad that your hips are not bothering you today and that gave [place] time to talk about your other medication.  Here's what we did today:  1) Stop taking clonidine. I will not be giving you additional refills.  2) Start taking Losartan 25 mg daily.  Come back in one week for a blood pressure check and to measure your electrolytes. Do not continue taking if you notice lip/throat swelling.  3) In one week, start taking atorvastatin 40 mg daily.  We can recheck your lipid levels at your next appointment and consider increasing your dose.  Come back if you experience muscle pain.

## 2018-10-26 NOTE — Assessment & Plan Note (Addendum)
Given his significant medical comorbidities (i.e. Hypertension, diabetes, smoking) which would contribute to his high ASCVD risk, he would significantly benefit from a high intensity statin. -CMP (when he returns for lab work on 11/25) -Start atorvastatin 40 mg daily (starting in one week so there is less overlap with starting losartan) -repeat Lipid panel in 3 months

## 2018-10-26 NOTE — Assessment & Plan Note (Addendum)
According to Gregory Harrison, he has not taken his clonidine in the past 4 days does not appear to have experienced any rebound hypertension (or it has already resolved).  His blood pressure in clinic today is appropriate though his past vitals suggest that this is an anomaly and that he does not fact need additional antihypertensive therapy.  Due to his diagnosis of diabetes instead start an ARB for renal protection. -Stop clonidine -Start Losartan 25 mg dialy -BMP in 1 week to check K and Cr

## 2018-10-28 ENCOUNTER — Other Ambulatory Visit: Payer: Self-pay | Admitting: Family Medicine

## 2018-10-28 DIAGNOSIS — E1142 Type 2 diabetes mellitus with diabetic polyneuropathy: Secondary | ICD-10-CM

## 2018-10-30 ENCOUNTER — Other Ambulatory Visit: Payer: Self-pay | Admitting: Family Medicine

## 2018-10-30 ENCOUNTER — Telehealth: Payer: Self-pay | Admitting: Pharmacist

## 2018-10-30 DIAGNOSIS — E118 Type 2 diabetes mellitus with unspecified complications: Secondary | ICD-10-CM

## 2018-10-30 NOTE — Patient Outreach (Signed)
[place]) Care Management  10/30/2018  Gregory Harrison 09/13/1951 [id number]  Contacted patient regarding patient assistance application process. Though [employer] does not state on their application that they require proof of income, they have been asking for proof of income before approving applications. Contacted patient, asked him to bring proof of household income when he comes to clinic for [place] on Monday. I will leave an envelope at the front desk for this information to be left for me to pick up. Once received, will pass to [employer], CPhT to proactively submit to [employer] for [employer].  Additionally, Mr. Laster and I spoke about his blood sugars. He notes that since discontinuing NPH and starting Tresiba, all morning fasting readings have been less than 200, typically in the 150-160s. He denies any low blood sugars, and is pleased with these results.   Notes that he started losartan 25 mg daily as directed on 11/18 visit with Dr. Pilar Plate, and plans to start atorvastatin on Monday also as directed. He has follow up scheduled on 12/14/2017 with Dr. Shawna Orleans.   Will route to Dr. Valentina Lucks and Dr. Shawna Orleans for [place].   [name], PharmD PGY2 Ambulatory Care Pharmacy Resident, [place] Network Phone: [phone]

## 2018-11-02 ENCOUNTER — Other Ambulatory Visit: Payer: Self-pay | Admitting: Family Medicine

## 2018-11-02 ENCOUNTER — Other Ambulatory Visit: Payer: PPO

## 2018-11-02 ENCOUNTER — Other Ambulatory Visit: Payer: Self-pay | Admitting: Pharmacy Technician

## 2018-11-02 DIAGNOSIS — I10 Essential (primary) hypertension: Secondary | ICD-10-CM | POA: Diagnosis not present

## 2018-11-02 DIAGNOSIS — E7800 Pure hypercholesterolemia, unspecified: Secondary | ICD-10-CM

## 2018-11-02 MED ORDER — EMPAGLIFLOZIN 25 MG PO TABS: 25.0000 mg | ORAL_TABLET | Freq: Every day | ORAL | Status: DC

## 2018-11-02 NOTE — Progress Notes (Signed)
As per jardiance patient medication assistance program.

## 2018-11-02 NOTE — Patient Outreach (Signed)
[place] [facility]) Care Management  11/02/2018  KOBIE WHIDBY 1951-10-21 [id number]    Care coordination call placed to [facility] patient assistance to inquire on the status of the patient's application for Jardiance.  Spoke to [place] who said patient is temporarily approved from 10/26/18-12/08/18. Patient needs to apply for [name] and if denied then a copy of the denial would need to be sent to [facility] for patient to continue receiving more medication. In the meantime, they will be sending out a 90 days supply of the medication.  Note will be routed to [place] to assist in the [name] process.  Will followup with patient in 7-10 business days to confirm receipt of medication.  Myosha Cuadras P. Emari Hreha, [place] Management [phone]

## 2018-11-03 ENCOUNTER — Encounter: Payer: Self-pay | Admitting: Family Medicine

## 2018-11-03 LAB — COMPREHENSIVE METABOLIC PANEL
A/G RATIO: 1.8 (ref 1.2–2.2)
ALBUMIN: 4.1 g/dL (ref 3.6–4.8)
ALT: 11 IU/L (ref 0–44)
AST: 14 IU/L (ref 0–40)
Alkaline Phosphatase: 54 IU/L (ref 39–117)
BILIRUBIN TOTAL: 0.2 mg/dL (ref 0.0–1.2)
BUN / CREAT RATIO: 18 (ref 10–24)
BUN: 18 mg/dL (ref 8–27)
CALCIUM: 9.5 mg/dL (ref 8.6–10.2)
CO2: 20 mmol/L (ref 20–29)
Chloride: 108 mmol/L — ABNORMAL HIGH (ref 96–106)
Creatinine, Ser: 1.02 mg/dL (ref 0.76–1.27)
GFR, EST AFRICAN AMERICAN: 86 mL/min/{1.73_m2} (ref >59)
GFR, EST NON AFRICAN AMERICAN: 75 mL/min/{1.73_m2} (ref >59)
Globulin, Total: 2.3 g/dL (ref 1.5–4.5)
Glucose: 167 mg/dL — ABNORMAL HIGH (ref 65–99)
Potassium: 4.4 mmol/L (ref 3.5–5.2)
SODIUM: 143 mmol/L (ref 134–144)
TOTAL PROTEIN: 6.4 g/dL (ref 6.0–8.5)

## 2018-11-03 LAB — LIPID PANEL
CHOL/HDL RATIO: 4.3 ratio (ref 0.0–5.0)
CHOLESTEROL TOTAL: 178 mg/dL (ref 100–199)
HDL: 41 mg/dL (ref >39)
LDL Calculated: 101 mg/dL — ABNORMAL HIGH (ref 0–99)
TRIGLYCERIDES: 182 mg/dL — AB (ref 0–149)
VLDL Cholesterol Cal: 36 mg/dL (ref 5–40)

## 2018-11-04 ENCOUNTER — Other Ambulatory Visit: Payer: Self-pay | Admitting: Pharmacist

## 2018-11-04 NOTE — Patient Outreach (Addendum)
[place] [facility]) Care Management  11/04/2018  MAKHI MUZQUIZ [birth year] [id number]  Received message from Susy Frizzle, CPhT, that Mr. Grabe was temporarily approved for a 90 day supply of Jardiance, but that [employer] will require proof of Medicare Extra Help/Low Income Subsidy ([name]) denial before full approval.   Contacted patient, left HIPAA compliant voicemail for him to return my call. I can help him complete the [name] application over the phone if he has never applied/been denied before. Will also inform him that he will be receiving 90 day supply of Jardiance. Will reinforce that he can either stop Invokana and immediately start Jardiance, or finish out his Invokana supply and then start Jardiance.  Will follow up in 3-5 business days if I have not heard back from the patient.    [name], PharmD PGY2 Ambulatory Care Pharmacy Resident, [place] Network Phone: [phone]

## 2018-11-10 ENCOUNTER — Other Ambulatory Visit: Payer: Self-pay | Admitting: Pharmacist

## 2018-11-10 ENCOUNTER — Encounter: Payer: Self-pay | Admitting: Pharmacist

## 2018-11-10 NOTE — Patient Outreach (Signed)
[place] [facility]) Care Management  11/10/2018  Gregory Harrison [birth year] [id number]  Received staff message from Dr. Shawna Orleans; patient to decrease Tresiba to 45 units daily and increase metformin back to 1000 mg BID.   Contacted patient, he expressed understanding of the plan and agreed. He was encouraged to reach back out to me with any other questions or concerns.   Will route to PCP for [facility], PharmD PGY2 Ambulatory Care Pharmacy Resident, [place] Network Phone: [phone]

## 2018-11-10 NOTE — Patient Outreach (Signed)
[place] [facility]) Care Management  11/10/2018  [name] 02-Apr-1951 [id number]  Contacted patient. He notes that he did receive a 90 day supply of Jardiance, and that he has started taking the medication and discontinued Invokana. Explained that a Medicare Extra Help denial would be required to continue in the [place] patient assistance program. With his permission, we completed the Extra Help application over the phone. I explained that he would receive an envelope from the [place] in 3-6 weeks with a determination. He will call me when he receives this - we will need proof of denial if he is denied to submit to [place] to continue his enrollment in the patient assistance program. If approved for Medicare Extra Help, his copays for all medications are likely to be more affordable, and there may be a reduction on his premiums.   Reviewed fasting blood sugars - he notes his 7 day average to be 122. He states that he had a few episodes of hypoglycemia overnight in the 60s and 70s. He self decreased metformin to 500 mg BID while continuing Tresiba 50 units daily and Jardiance 25 mg daily, and has not had hypoglycemia since then.   He reports tolerating losartan and atorvastatin well, denies any s/sx intolerance (hypotension, muscle aches/pains). He reports that he has been checking blood pressures at home, but his meter is highly variable and he does not believe it is accurate. I recommended he bring his meter to his next appointment with Dr. Shawna Orleans to compare to the clinic meter.   Will route message to Dr. Valentina Lucks and Dr. Shawna Orleans for decision on whether to continue current regimen until follow up with Dr. Shawna Orleans on 12/14/2018, or to decrease Tresiba dose to allow for maximization of metformin dosing.    [name], PharmD PGY2 Ambulatory Care Pharmacy Resident, [place] Network Phone: [phone]

## 2018-11-12 ENCOUNTER — Other Ambulatory Visit: Payer: Self-pay | Admitting: Pharmacy Technician

## 2018-11-12 NOTE — Patient Outreach (Signed)
[place] [facility]) Care Management  11/12/2018  Gregory Harrison 19-Oct-1951 [id number]   Successful outgoing call placed to patient in regards to his medication assistance application for Jardiance through [employer].  Spoke to [name], HIPAA identifiers verified.  Patient informed me that he had received 90 days supply of Jardiance. Inquired if Gregory Harrison had any questions regarding the medication or how to get his refills and he stated that he had no questions. Informed patient to keep my name and number and if he had questions to give me a call as he is approved through the end of 2020. Patient verbalized understanding.   Nakeitha Milligan P. Rhyli Depaula, [place] Management [phone]

## 2018-12-10 ENCOUNTER — Other Ambulatory Visit: Payer: Self-pay | Admitting: Pharmacist

## 2018-12-10 ENCOUNTER — Other Ambulatory Visit: Payer: Self-pay | Admitting: *Deleted

## 2018-12-10 DIAGNOSIS — E1142 Type 2 diabetes mellitus with diabetic polyneuropathy: Secondary | ICD-10-CM

## 2018-12-10 MED ORDER — INSULIN DEGLUDEC 200 UNIT/ML ~~LOC~~ SOPN: 50.0000 [IU] | PEN_INJECTOR | Freq: Every day | SUBCUTANEOUS | 0 refills | Status: DC

## 2018-12-10 NOTE — Patient Outreach (Signed)
[place] [facility]) Care Management  12/10/2018  Gregory Harrison 10/08/1951 [id number]   Contacted patient to inquire if he had received information regarding Medicare Low Income Subsidy approval/denial.   Left HIPAA compliant message for patient to return my call.   [name], PharmD PGY2 Ambulatory Care Pharmacy Resident, [place] Network Phone: [phone]

## 2018-12-14 ENCOUNTER — Ambulatory Visit: Payer: PPO | Admitting: Family Medicine

## 2018-12-23 ENCOUNTER — Other Ambulatory Visit: Payer: Self-pay | Admitting: Pharmacist

## 2018-12-23 ENCOUNTER — Other Ambulatory Visit: Payer: Self-pay | Admitting: Pharmacy Technician

## 2018-12-23 NOTE — Patient Outreach (Signed)
[place] [facility]) Care Management  12/23/2018  Gregory Harrison 30-Oct-1951 [id number]  Contacted [employer] - patient was approved for [name] Category 4: $85 annual deductible, and 15% of prescription costs up to $5100 out of pocket; after that, generic copays will be $3.40 and brand will be $8.50.   Contacted patient; he is going to drop off a copy of the [name] partial approval letter to [facility] tomorrow. We will fax a copy into [place] to see if they will continue to provide Jardiance through patient assistance.   Patient also notes that he ran out of [place] samples, and switched back to taking Novolin N, 20 units once daily with breakfast. He has only been checking fasting blood sugars, and reports a 7 day average of 151. He notes some episodes of symptomatic hypoglycemia (sweating) if his blood sugars are less than 100. He notes that he is continuing to take Jardiance daily, and has about 15 day supply left. I do not believe we will be able to get appropriate A1c control without hypoglycemia with Novolin N, especially since patient is only taking the medication once daily.   Contacted [facility]; they report that the copay for a 36 day supply of Tyler Aas is $90.  Unfortunately, his copay for this medication was already cheaper than the reduction applied through Medicare Extra Help Category 4.   Tyler Aas patient assistance through [employer] is currently requiring that the patient pay for the prescription with their insurance at least once, then submit the application with a letter explaining their financial hardship. I don't believe the patient will be able to afford this copay.   Other preferred basal insulins on HTA include Lantus, copay would be the same as [place] on his insurance. [employer] is requiring that patients spend 2% of their yearly household income on prescription medications at the pharmacy prior to submitting for Lantus patient assistance.    However, we could pursue [employer] patient assistance through [employer]. They currently are not requiring an out of pocket spend for patient assistance, and accept patients with partial low income subsidy.   Will message PCP Dr. Shawna Orleans regarding [place] patient assistance. If she is OK with submitting this, we will prepare the patient portion for Gregory Harrison to sign when he drops by clinic tomorrow, and will prepare prescriber portion for her signature.    [name], PharmD PGY2 Ambulatory Care Pharmacy Resident, [place] Network Phone: [phone]

## 2018-12-23 NOTE — Patient Outreach (Signed)
Gregory Harrison) Care Management  12/23/2018  Gregory Harrison Oct 31, 1951 [id number]   Received message from Dr. Shawna Orleans. She is OK with proceeding with the Basaglar application. Will route note to [facility], CPhT to prepare the application. I will bring it to [facility] with me tomorrow for patient's signature and Dr. Lora Havens completion.   Patient verbalized understanding and plans to bring Medicare Extra Help paperwork by clinic tomorrow for me.   [name], PharmD PGY2 Ambulatory Care Pharmacy Resident, [place] Network Phone: [phone]

## 2018-12-23 NOTE — Patient Outreach (Signed)
Gregory Harrison) [name]  12/23/2018  Gregory Harrison May 24, 1951 [id number]   Received patient assistance referral from [place] for [employer] through [employer].  Prepared both patient and provider portions of application  for [place] to take with her to clinic on Thursday Jan 15.  Will followup with Catie on Friday January 16 to obtain application.  Gregory Harrison, Gregory Harrison [phone]

## 2018-12-25 ENCOUNTER — Other Ambulatory Visit: Payer: Self-pay | Admitting: Pharmacy Technician

## 2018-12-25 NOTE — Patient Outreach (Signed)
[place] [facility]) Care Management  12/25/2018  KINSER FELLMAN [birth year] [id number]  Opened in error.  Saheed Carrington P. Naisha Wisdom, [place] Management [phone]

## 2018-12-25 NOTE — Patient Outreach (Signed)
[place] [facility]) Care Management  12/25/2018  Gregory Harrison September 13, 1951 [id number]   Received patient's [name] letter from social security.   Faxed the letter over to [employer] patient assistance to see if they would extend his temporary approval for Jardiance.  Will followup with BI in 7-10 business days to inquire on status of application.  Gregory Harrison P. Kenzel Ruesch, [place] Management [phone]

## 2018-12-31 ENCOUNTER — Other Ambulatory Visit: Payer: Self-pay | Admitting: Pharmacy Technician

## 2018-12-31 NOTE — Patient Outreach (Signed)
[place] [facility]) Care Management  12/31/2018  Gregory Harrison 1951-10-04 [id number]   Received referral from [place] for [employer] with [employer].  [facility] had patient fill out information while at the clinic as well as had the provider part completed.Submitted completed application via fax to [employer].   Will followup with [place] in 7-10 business days to inquire on status of application.  Gregory Harrison P. [name], [place] Management [phone]

## 2019-01-04 ENCOUNTER — Encounter: Payer: Self-pay | Admitting: Family Medicine

## 2019-01-04 NOTE — Progress Notes (Signed)
Received fax from [employer] patient assistance that unable to provide medication for [name]. Patient may reapply one year from date of denial (01/01/19) unless there is a change in circumstance.

## 2019-01-05 ENCOUNTER — Other Ambulatory Visit: Payer: Self-pay | Admitting: Pharmacy Technician

## 2019-01-05 NOTE — Patient Outreach (Signed)
[place] [facility]) Care Management  01/05/2019  SEAN MALINOWSKI Jun 17, 1951 [id number]   Care coordination call placed to [place] patient assistance program in regards to patient's Jardiance application.  Spoke to [place] who said it looks as though patient had been denied due to having [name].  Informed Elle that the letter stated patient was only approved for partial [name] and that his copay per HTA would be $45 for 30 days supply/$90 days for 30 days supply OR 15% of the ingredient cost whichever is cheaper. Informed Elle that the patient was already stating he was having trouble paying the $90 and if he were to reach gap coverage then that amount could potentially increase. Elle placed me on hold and spoke to her manager. When Elle came back to the phone she said they were able to get the patient APPROVED from 01/05/2019-12/09/2019. Per Elle patient is not due for a refill currently (next refill due 2//23/2020) but that all the patient had to do would be to call into BI and followup the prompts related to a reorder of medication. She said an approval letter would be sent to both patient and provider.  Will followup with patient to relay the information above.  Aylee Littrell P. Revella Shelton, [place] Management [phone]

## 2019-01-05 NOTE — Patient Outreach (Signed)
[place] [facility]) Care Management  01/05/2019  Gregory Harrison 21-Dec-1950 [id number]    ADDENDUM  Successful outreach call placed to [name] in regards to his BI application for [employer].  Spoke to patient, HIPAA identifiers verified. Informed [name] that he had been APPROVED to receive Jardiance through the [place] patient assistance program for the rest of the calendar year, expiring 12/09/2019. Informed [name] that when he needed a refill that he was to call the number on his bottle and they would send out the medication to him. Informed patient to call approximately 2 weeks before running out of medication to avoid a delay in his therapy. Informed him that his next refill was due around 01/31/2019 per Elle at [facility] so he should call around 01/21/2019 to place his order. Patient verbalized understanding.  Patient stated he had no other questions or concerns concerning the Jardiance. Informed patient that I would continue to followup with him concerning his Basaglar patient assistance through [employer]. Confirmed with patient that he had our name and number if questions were to come up in the future.  Alizia Greif P. Aleeza [place], [place] Management [phone]

## 2019-01-07 ENCOUNTER — Other Ambulatory Visit: Payer: Self-pay | Admitting: Pharmacy Technician

## 2019-01-07 NOTE — Patient Outreach (Signed)
[name]) Care Management  01/07/2019  Gregory Harrison Apr 13, 1951 [id number]   Care coordination call placed to [name] in regards to [facility] application.  Spoke to [name] who informed that patient had been APPROVED to receive Basaglar through the patient assistance program effected 01/06/2019-12/09/2019. [name] says an order for 4 boxes of Basagalr was requested on 01/06/2019 but there is no shipping information yet. [name] says the [name] should arrive at the providers office within 10-14 business days from the 29th.  Will followup with patient in 10-14 business days to confirm receipt of medication.  [name] P. [name], [place] Management [phone]

## 2019-01-11 ENCOUNTER — Ambulatory Visit (INDEPENDENT_AMBULATORY_CARE_PROVIDER_SITE_OTHER): Payer: PPO | Admitting: Family Medicine

## 2019-01-11 ENCOUNTER — Other Ambulatory Visit: Payer: Self-pay

## 2019-01-11 ENCOUNTER — Encounter: Payer: Self-pay | Admitting: Family Medicine

## 2019-01-11 VITALS — BP 122/62 | HR 74 | Temp 97.5°F | Wt 191.0 lb

## 2019-01-11 DIAGNOSIS — E1142 Type 2 diabetes mellitus with diabetic polyneuropathy: Secondary | ICD-10-CM | POA: Diagnosis not present

## 2019-01-11 DIAGNOSIS — Z23 Encounter for immunization: Secondary | ICD-10-CM

## 2019-01-11 DIAGNOSIS — E7800 Pure hypercholesterolemia, unspecified: Secondary | ICD-10-CM | POA: Diagnosis not present

## 2019-01-11 DIAGNOSIS — I10 Essential (primary) hypertension: Secondary | ICD-10-CM

## 2019-01-11 LAB — POCT GLYCOSYLATED HEMOGLOBIN (HGB A1C): HbA1c, POC (controlled diabetic range): 8.1 % — AB (ref 0.0–7.0)

## 2019-01-11 NOTE — Patient Instructions (Signed)
It was good to see you today!  You should be getting basaglar soon.   Please check-out at the front desk before leaving the clinic. Make an appointment in 1 month.   Please bring all of your medications with you to each visit.   Sign up for My Chart to have easy access to your labs results, and communication with your primary care physician.  Feel free to call with any questions or concerns at any time, at [phone].   Take care,  Dr. [name], [place]

## 2019-01-11 NOTE — Assessment & Plan Note (Signed)
Stable and at goal.  Continue current management with losartan 25 mg daily and hydrochlorothiazide 12.5 mg daily.

## 2019-01-11 NOTE — Assessment & Plan Note (Signed)
Continue atorvastatin

## 2019-01-11 NOTE — Progress Notes (Signed)
21 

## 2019-01-11 NOTE — Progress Notes (Signed)
    Subjective:  Gregory Harrison is a 68 y.o. male who presents to the [facility] today for diabetes follow up  HPI:  SUBJECTIVE: 68 y.o. male for follow up of diabetes.  medication compliance: compliant most of the time.  Taking metformin 1000 mg twice daily.  Jardiance 25 mg twice daily which he has noticed some more frequent urination but no dysuria or polydipsia.  Has gotten the patient assistance from [employer] for this. he has been on [place] 25 units daily but is about to run out.  He has been told that he should expect Basaglar to be of the patient assistance program. diabetic diet compliance: compliant most of the time home glucose monitoring: is performed regularly, Home monitoring: range 77-502, averaging 150-180s. Only 1 low. Only one very high at 500s.  further diabetic ROS: no chest pain, dyspnea or TIA's, no numbness, no unusual visual symptoms.   Does have pain and tingling in his extremities for which she takes gabapentin, this helps a lot.    Hypertension Taking losartan 25 mg daily, HCTZ 12.5 mg daily.  Compliant all the time, tolerating well.  No lightheadedness or dizziness, no lower extremity edema  Hyperlipidemia Taking atorvastatin 40 mg daily, tolerating well, compliant. No muscle aches or abdominal pain.   ROS: Per HPI  Social Hx: He reports that he has quit smoking. He has a 40.00 pack-year smoking history. He has never used smokeless tobacco. He reports current drug use. Drug: Marijuana. He reports that he does not drink alcohol.   Objective:  Physical Exam: BP 122/62   Pulse 74   Temp (!) 97.5 F (36.4 C) (Oral)   Wt 191 lb (86.6 kg)   SpO2 97%   BMI 24.52 kg/m   Gen: NAD, resting comfortably CV: RRR with no murmurs appreciated Pulm: NWOB, CTAB with no crackles, wheezes, or rhonchi GI: Normal bowel sounds present. Soft, Nontender, Nondistended. MSK: no edema, cyanosis, or clubbing noted Skin: warm, dry Neuro: grossly normal, moves all extremities Psych: Normal  affect and thought content  Results for orders placed or performed in visit on 01/11/19 (from the past 72 hour(s))  HgB A1c     Status: Abnormal   Collection Time: 01/11/19  9:57 AM  Result Value Ref Range   Hemoglobin A1C     HbA1c POC (<> result, manual entry)     HbA1c, POC (prediabetic range)     HbA1c, POC (controlled diabetic range) 8.1 (A) 0.0 - 7.0 %     Assessment/Plan:  Diabetes (HCC) Uncontrolled but A1c is significantly improved to 8.1 today from 11.6 in October.  His home readings are also in a much better range with only 1 symptomatic hypoglycemic event. he is doing well on metformin ER formulation 1000 mg twice daily, Jardiance 25 mg daily.  He has recently gotten approved for basaglar so discussed switch from [place] 25 mg daily to Basaglar 25 mg daily.  Follow-up in 1 month  HTN (hypertension) Stable and at goal.  Continue current management with losartan 25 mg daily and hydrochlorothiazide 12.5 mg daily.  Diabetic neuropathy ([place]) Continue gabapentin  Hypercholesterolemia Continue atorvastatin   Gregory Lope, DO PGY-3, [place] Medicine 01/11/2019 10:02 AM

## 2019-01-11 NOTE — Assessment & Plan Note (Signed)
Uncontrolled but A1c is significantly improved to 8.1 today from 11.6 in October.  His home readings are also in a much better range with only 1 symptomatic hypoglycemic event. he is doing well on metformin ER formulation 1000 mg twice daily, Jardiance 25 mg daily.  He has recently gotten approved for basaglar so discussed switch from [place] 25 mg daily to Basaglar 25 mg daily.  Follow-up in 1 month

## 2019-01-11 NOTE — Assessment & Plan Note (Signed)
Continue gabapentin.

## 2019-01-13 ENCOUNTER — Telehealth: Payer: Self-pay | Admitting: *Deleted

## 2019-01-13 NOTE — Telephone Encounter (Signed)
Spoke with Mr. Zaring informing him that his insulin is ready for pick. He said he would come by tomorrow.Told him to ask at the front and someone with bring the insulin up for him. Salvatore Marvel, CMA

## 2019-01-13 NOTE — Telephone Encounter (Signed)
Received patient's meds (insulin) today if some someone wants to call him and let him know they are here.

## 2019-01-13 NOTE — Telephone Encounter (Signed)
Please let patient know that insulin has arrived and is ready for pickup.

## 2019-01-15 ENCOUNTER — Other Ambulatory Visit: Payer: Self-pay | Admitting: Pharmacy Technician

## 2019-01-15 NOTE — Patient Outreach (Signed)
[place] [facility]) Care Management  01/15/2019  STEPHANIE MCGLONE 31-Aug-1951 [id number]    Successful outreach call placed to patient in regards to his [place] manager for [employer].  Spoke to patient, HIPAA identifiers verified. Inquired if patient had picked up his Basaglar from the provider's office. Patient stated he picked up 4 boxes. Informed patient how to obtain his refills by calling his provider's office when he had approximately a 2 week supply left as the provider's office has to fill out a form and fax it to [employer]. Patient verbalized understanding. Confirmed patient had our name and number if he needed help or assistance in the future.  Will route note to [place] for patient assistance case closure.  Anokhi Shannon P. Sura Canul, [place] Management [phone]

## 2019-01-17 ENCOUNTER — Other Ambulatory Visit: Payer: Self-pay | Admitting: Family Medicine

## 2019-01-17 DIAGNOSIS — I10 Essential (primary) hypertension: Secondary | ICD-10-CM

## 2019-02-17 ENCOUNTER — Encounter: Payer: Self-pay | Admitting: Family Medicine

## 2019-02-17 ENCOUNTER — Other Ambulatory Visit: Payer: Self-pay

## 2019-02-17 ENCOUNTER — Ambulatory Visit (INDEPENDENT_AMBULATORY_CARE_PROVIDER_SITE_OTHER): Payer: PPO | Admitting: Family Medicine

## 2019-02-17 VITALS — BP 152/78 | HR 70 | Ht 74.0 in | Wt 189.0 lb

## 2019-02-17 DIAGNOSIS — G8929 Other chronic pain: Secondary | ICD-10-CM | POA: Diagnosis not present

## 2019-02-17 DIAGNOSIS — E7800 Pure hypercholesterolemia, unspecified: Secondary | ICD-10-CM | POA: Diagnosis not present

## 2019-02-17 DIAGNOSIS — I10 Essential (primary) hypertension: Secondary | ICD-10-CM

## 2019-02-17 DIAGNOSIS — E1142 Type 2 diabetes mellitus with diabetic polyneuropathy: Secondary | ICD-10-CM | POA: Diagnosis not present

## 2019-02-17 MED ORDER — HYDROCHLOROTHIAZIDE 12.5 MG PO TABS: 12.5000 mg | ORAL_TABLET | Freq: Every day | ORAL | 3 refills | Status: DC

## 2019-02-17 MED ORDER — LOSARTAN POTASSIUM 25 MG PO TABS: 25.0000 mg | ORAL_TABLET | Freq: Every day | ORAL | 3 refills | Status: DC

## 2019-02-17 NOTE — Patient Instructions (Addendum)
Your diabetes is much better controlled. Keep up the good work.  We will restart your blood pressure medications today. Please come back in 1 month for bloodwork.   [facility] Address: [address], [place], [place] [address] Phone: [phone]

## 2019-02-17 NOTE — Progress Notes (Signed)
Subjective:  Gregory Harrison is a 67 y.o. male who presents to the [facility] today for diabetes follow up  HPI:  Diabetes He states he has been doing well on metformin extended release 1000 mg twice a day, Jardiance 25 mg daily and Basaglar 25 units daily.  He has brought his home glucometer with him today and his sugars have been running around 130s to 180s.  He has had a few lows at 115 and 83 over the last month.  He states he was only symptomatic for 1 of these.  He has a few hypoglycemic events up to 23 and in the lower 300s that was in the setting of being in [place] and not watching what he ate at his mother's boyfriend's funeral. He has no polyuria, polydipsia.  He has no chest pain, shortness of breath lower extremity edema.  Chronic pain He complains of chronic pain medications.  He is gotten hydrocodone that he has bought illegally.  He has not used this medication but states he feels he has no other choice due to his uncontrolled pain.  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 He has not heard back from pain management referral placed last fall because he does not not generally check his voicemail.  Hypertension Has been taking his losartan and needs a refill.  He has not been on hydrochlorothiazide for the last several months because he ran out did not not ask for refills.  No lightheadedness, dizziness, falls. Does not check his blood pressure at home  Hyperlipidemia Taking Lipitor 40 mg daily tolerating well.  No abdominal pain or muscle aches    ROS: Per HPI  Social Hx: He reports that he has quit smoking. He has a 40.00 pack-year smoking history. He has never used smokeless tobacco. He reports current drug use. Drug: Marijuana. He reports that he does not drink alcohol.    Objective:  Physical Exam: BP (!) 152/78   Pulse 70   Ht 6\' 2"  (1.88 m)   Wt 189 lb (85.7 kg)   SpO2 98%   BMI 24.27  kg/m   Gen: NAD, resting comfortably CV: RRR with no murmurs appreciated Pulm: NWOB, CTAB with no crackles, wheezes, or rhonchi GI: Normal bowel sounds present. Soft, Nontender, Nondistended. MSK: no edema, cyanosis, or clubbing noted Skin: warm, dry Neuro: grossly normal, moves all extremities Psych: Normal affect and thought content   Assessment/Plan:  Diabetes ([place]) Control is improving.  He has had a few hypoglycemic readings on his glucometer but overall seems to be controlled for age.  As he has had scant hypoglycemic events, continue current management.  He will stay on metformin ER 1000 mg twice a day, Jardiance 25 mg daily and Basaglar 25 units daily.  HTN (hypertension) Uncontrolled.  Likely due to medication noncompliance.  Continue losartan 25 mg daily.  Restart hydrochlorothiazide 12.5 mg daily.  Follow-up in 1 month for blood pressure recheck and will get BMP at that time.  Hypercholesterolemia Stable.  Continue atorvastatin 40 mg daily  Chronic pain Patient has chronic joint pain in his shoulders and knees from osteoarthritis.  He is quite functional despite this.  Advised patient against taking chronic opiates particularly from an unverified source.  Per chart review referral to pain center was processed in November 2019.  Patient was given contact information for pain management clinic   [name], DO PGY-3, [place]  [facility] 02/17/2019 9:29 AM

## 2019-02-17 NOTE — Assessment & Plan Note (Signed)
Patient has chronic joint pain in his shoulders and knees from osteoarthritis.  He is quite functional despite this.  Advised patient against taking chronic opiates particularly from an unverified source.  Per chart review referral to pain center was processed in November 2019.  Patient was given contact information for pain management clinic

## 2019-02-17 NOTE — Assessment & Plan Note (Signed)
Stable.  Continue atorvastatin 40 mg daily. ° °

## 2019-02-17 NOTE — Assessment & Plan Note (Signed)
Control is improving.  He has had a few hypoglycemic readings on his glucometer but overall seems to be controlled for age.  As he has had scant hypoglycemic events, continue current management.  He will stay on metformin ER 1000 mg twice a day, Jardiance 25 mg daily and Basaglar 25 units daily.

## 2019-02-17 NOTE — Assessment & Plan Note (Signed)
Uncontrolled.  Likely due to medication noncompliance.  Continue losartan 25 mg daily.  Restart hydrochlorothiazide 12.5 mg daily.  Follow-up in 1 month for blood pressure recheck and will get BMP at that time.

## 2019-03-22 ENCOUNTER — Ambulatory Visit: Payer: PPO | Admitting: Family Medicine

## 2019-03-24 ENCOUNTER — Telehealth: Payer: Self-pay | Admitting: Family Medicine

## 2019-03-24 ENCOUNTER — Other Ambulatory Visit: Payer: Self-pay

## 2019-03-24 ENCOUNTER — Telehealth (INDEPENDENT_AMBULATORY_CARE_PROVIDER_SITE_OTHER): Payer: PPO | Admitting: Family Medicine

## 2019-03-24 DIAGNOSIS — E1142 Type 2 diabetes mellitus with diabetic polyneuropathy: Secondary | ICD-10-CM

## 2019-03-24 DIAGNOSIS — M25512 Pain in left shoulder: Secondary | ICD-10-CM | POA: Diagnosis not present

## 2019-03-24 DIAGNOSIS — E7800 Pure hypercholesterolemia, unspecified: Secondary | ICD-10-CM

## 2019-03-24 DIAGNOSIS — I10 Essential (primary) hypertension: Secondary | ICD-10-CM

## 2019-03-24 DIAGNOSIS — M25519 Pain in unspecified shoulder: Secondary | ICD-10-CM | POA: Insufficient documentation

## 2019-03-24 HISTORY — DX: Pain in unspecified shoulder: M25.519

## 2019-03-24 MED ORDER — DICLOFENAC SODIUM 1 % TD GEL: 2.0000 g | Freq: Four times a day (QID) | TRANSDERMAL | 0 refills | Status: DC

## 2019-03-24 NOTE — Progress Notes (Signed)
[place] Telemedicine Visit  Patient consented to have virtual visit. Method of visit:Telephone call  Encounter participants: Patient: Gregory Harrison - located at Home Provider: Andrena Mews - located at Offsite Others (if applicable): N/A  Chief Complaint: F/U on chronic problems  HPI:  HTN: BP He is compliant with is meds (HCTZ 12.5 mg QD, Losartan 25 mg QD). His BP checked at home runs fine for the most time except when he gets upset that it will get as high as 160/90. He denies any BP related concern.  DM2: Compliant with Basaglar 40 units QD. He stated that his dose was reduced to 25 units, but once his CBG started running high, he returned to his previous dose of 40 units QD. He is also on Metformin 1000 mg BID and Jardiance 25 mg QD. His home CBG currently runs in the 150s with the highest of 176 in the past few weeks. He denies a hypoglycemic episode.  HLD: COmpliant with Lipitor 40 mg QD.  Shoulder pain: Left shoulder pain ongoing for years. His pain is daily, about 10/10 in severity, aching in nature. He uses a heating pad and OTC pain gel medication. Aleeve does not help a lot. He was referred to a pain specialist by his PCP and had called multiple times to schedule appointments without being successful.  ROS: per HPI  Pertinent PMHx: Problem list reviewed  Exam:  Respiratory: No respiratory distress  Assessment/Plan:  HTN (hypertension) BP stable per patient. Most recent labs reviewed and discussed with him. Continue current med regimen. F/U in 4-8 weeks with PCP for reassessment.  Diabetes ([place]) DM stable on current regimen. Last A1C in Feb of 2020 improved from 11 to 8. Continue current regimen. F/U with PCP for A1C check in 2-3 months. A brief diet counseling completed.  Hypercholesterolemia Stable on current regimen. Lab reviewed and discussed with him. Return in Nov for [place]. Diet counseling done.  Pain in joint, shoulder  region Xray reviewed showing moderate to severe arthritis. I see where PCP referred to pain specialist last year. I will message his PCP and referral specialist to follow-up on this. Trial of Voltaren gel recommended and was prescribed. F/U as needed.    Time spent during visit with patient: 16 minutes

## 2019-03-24 NOTE — Assessment & Plan Note (Signed)
Xray reviewed showing moderate to severe arthritis. I see where PCP referred to pain specialist last year. I will message his PCP and referral specialist to follow-up on this. Trial of Voltaren gel recommended and was prescribed. F/U as needed.

## 2019-03-24 NOTE — Assessment & Plan Note (Signed)
BP stable per patient. Most recent labs reviewed and discussed with him. Continue current med regimen. F/U in 4-8 weeks with PCP for reassessment.

## 2019-03-24 NOTE — Telephone Encounter (Signed)
Patient need to discuss pain specialist referral with PCP.    Gregory Harrison and Dr. Shawna Orleans, please help patient with his Pain clinic referral. Thanks.

## 2019-03-24 NOTE — Assessment & Plan Note (Signed)
DM stable on current regimen. Last A1C in Feb of 2020 improved from 11 to 8. Continue current regimen. F/U with PCP for A1C check in 2-3 months. A brief diet counseling completed.

## 2019-03-24 NOTE — Telephone Encounter (Signed)
Per chart documentation pain management had called patient in Nov 2019. Does a new referral need to be placed

## 2019-03-24 NOTE — Assessment & Plan Note (Signed)
Stable on current regimen. Lab reviewed and discussed with him. Return in Nov for [place]. Diet counseling done.

## 2019-04-07 ENCOUNTER — Other Ambulatory Visit: Payer: Self-pay | Admitting: *Deleted

## 2019-04-08 ENCOUNTER — Other Ambulatory Visit: Payer: Self-pay | Admitting: Family Medicine

## 2019-04-08 MED ORDER — BASAGLAR KWIKPEN 100 UNIT/ML ~~LOC~~ SOPN: 40.0000 [IU] | PEN_INJECTOR | Freq: Every day | SUBCUTANEOUS | Status: DC

## 2019-04-08 NOTE — Progress Notes (Signed)
[employer] forms completed and placed in fax file

## 2019-04-10 ENCOUNTER — Other Ambulatory Visit: Payer: Self-pay | Admitting: Family Medicine

## 2019-04-10 DIAGNOSIS — E1142 Type 2 diabetes mellitus with diabetic polyneuropathy: Secondary | ICD-10-CM

## 2019-04-16 ENCOUNTER — Other Ambulatory Visit: Payer: Self-pay | Admitting: *Deleted

## 2019-04-19 NOTE — Telephone Encounter (Signed)
Patient receives his medication from [employer] Patient medication assistance program. Paperwork was completed and placed in fax pile last week.

## 2019-06-23 DIAGNOSIS — H2513 Age-related nuclear cataract, bilateral: Secondary | ICD-10-CM | POA: Diagnosis not present

## 2019-06-23 DIAGNOSIS — E113393 Type 2 diabetes mellitus with moderate nonproliferative diabetic retinopathy without macular edema, bilateral: Secondary | ICD-10-CM | POA: Diagnosis not present

## 2019-06-23 DIAGNOSIS — H35351 Cystoid macular degeneration, right eye: Secondary | ICD-10-CM | POA: Diagnosis not present

## 2019-07-14 DIAGNOSIS — H2513 Age-related nuclear cataract, bilateral: Secondary | ICD-10-CM | POA: Diagnosis not present

## 2019-07-14 DIAGNOSIS — H31003 Unspecified chorioretinal scars, bilateral: Secondary | ICD-10-CM | POA: Diagnosis not present

## 2019-07-14 DIAGNOSIS — H3582 Retinal ischemia: Secondary | ICD-10-CM | POA: Diagnosis not present

## 2019-07-14 DIAGNOSIS — E113591 Type 2 diabetes mellitus with proliferative diabetic retinopathy without macular edema, right eye: Secondary | ICD-10-CM | POA: Diagnosis not present

## 2019-07-14 DIAGNOSIS — E113512 Type 2 diabetes mellitus with proliferative diabetic retinopathy with macular edema, left eye: Secondary | ICD-10-CM | POA: Diagnosis not present

## 2019-07-15 ENCOUNTER — Ambulatory Visit (INDEPENDENT_AMBULATORY_CARE_PROVIDER_SITE_OTHER): Payer: PPO | Admitting: Family Medicine

## 2019-07-15 ENCOUNTER — Encounter: Payer: Self-pay | Admitting: Family Medicine

## 2019-07-15 ENCOUNTER — Other Ambulatory Visit: Payer: Self-pay

## 2019-07-15 VITALS — BP 160/76 | HR 59 | Ht 74.0 in | Wt 181.2 lb

## 2019-07-15 DIAGNOSIS — R42 Dizziness and giddiness: Secondary | ICD-10-CM

## 2019-07-15 DIAGNOSIS — E7800 Pure hypercholesterolemia, unspecified: Secondary | ICD-10-CM | POA: Diagnosis not present

## 2019-07-15 DIAGNOSIS — I10 Essential (primary) hypertension: Secondary | ICD-10-CM

## 2019-07-15 DIAGNOSIS — R634 Abnormal weight loss: Secondary | ICD-10-CM | POA: Diagnosis not present

## 2019-07-15 DIAGNOSIS — G8929 Other chronic pain: Secondary | ICD-10-CM

## 2019-07-15 DIAGNOSIS — E1142 Type 2 diabetes mellitus with diabetic polyneuropathy: Secondary | ICD-10-CM

## 2019-07-15 DIAGNOSIS — M25512 Pain in left shoulder: Secondary | ICD-10-CM

## 2019-07-15 LAB — POCT GLYCOSYLATED HEMOGLOBIN (HGB A1C): HbA1c, POC (controlled diabetic range): 9.1 % — AB (ref 0.0–7.0)

## 2019-07-15 MED ORDER — BASAGLAR KWIKPEN 100 UNIT/ML ~~LOC~~ SOPN: 40.0000 [IU] | PEN_INJECTOR | Freq: Every day | SUBCUTANEOUS | Status: DC

## 2019-07-15 MED ORDER — JARDIANCE 25 MG PO TABS: 25.0000 mg | ORAL_TABLET | Freq: Every day | ORAL | Status: DC

## 2019-07-15 MED ORDER — DICLOFENAC SODIUM 1 % TD GEL: 2.0000 g | Freq: Four times a day (QID) | TRANSDERMAL | 0 refills | Status: DC

## 2019-07-15 MED ORDER — GABAPENTIN 400 MG PO CAPS: 400.0000 mg | ORAL_CAPSULE | Freq: Three times a day (TID) | ORAL | 3 refills | Status: DC

## 2019-07-15 MED ORDER — METFORMIN HCL ER 500 MG PO TB24: 1000.0000 mg | ORAL_TABLET | Freq: Two times a day (BID) | ORAL | 3 refills | Status: DC

## 2019-07-15 MED ORDER — TRAMADOL HCL 50 MG PO TABS: 50.0000 mg | ORAL_TABLET | Freq: Two times a day (BID) | ORAL | 0 refills | Status: AC

## 2019-07-15 MED ORDER — LOSARTAN POTASSIUM 25 MG PO TABS: 25.0000 mg | ORAL_TABLET | Freq: Every day | ORAL | 3 refills | Status: DC

## 2019-07-15 MED ORDER — HYDROCHLOROTHIAZIDE 12.5 MG PO TABS: 12.5000 mg | ORAL_TABLET | Freq: Every day | ORAL | 3 refills | Status: DC

## 2019-07-15 MED ORDER — ATORVASTATIN CALCIUM 40 MG PO TABS: 40.0000 mg | ORAL_TABLET | Freq: Every day | ORAL | 0 refills | Status: DC

## 2019-07-15 NOTE — Progress Notes (Signed)
21 

## 2019-07-15 NOTE — Patient Instructions (Addendum)
It was a pleasure meeting you today,  We will get some lab work done today and I will call you with the results if abnormal.    Follow up with me in 2 weeks.  Please bring in all medications.  Check blood sugars in the morning and a couple hours after a meal and blood pressure every other day. Bring in report next clinic visit.  Continue taking Metformin 2 tablets twice daily. Insulin 25 units daily.  If you experience any further dizziness please let the clinic know.

## 2019-07-16 ENCOUNTER — Other Ambulatory Visit: Payer: Self-pay | Admitting: Family Medicine

## 2019-07-16 DIAGNOSIS — I10 Essential (primary) hypertension: Secondary | ICD-10-CM

## 2019-07-16 DIAGNOSIS — R634 Abnormal weight loss: Secondary | ICD-10-CM | POA: Insufficient documentation

## 2019-07-16 DIAGNOSIS — R42 Dizziness and giddiness: Secondary | ICD-10-CM | POA: Insufficient documentation

## 2019-07-16 HISTORY — DX: Abnormal weight loss: R63.4

## 2019-07-16 LAB — COMPREHENSIVE METABOLIC PANEL
ALT: 20 IU/L (ref 0–44)
AST: 16 IU/L (ref 0–40)
Albumin/Globulin Ratio: 2 (ref 1.2–2.2)
Albumin: 4.9 g/dL — ABNORMAL HIGH (ref 3.8–4.8)
Alkaline Phosphatase: 64 IU/L (ref 39–117)
BUN/Creatinine Ratio: 19 (ref 10–24)
BUN: 21 mg/dL (ref 8–27)
Bilirubin Total: 0.6 mg/dL (ref 0.0–1.2)
CO2: 23 mmol/L (ref 20–29)
Calcium: 10.6 mg/dL — ABNORMAL HIGH (ref 8.6–10.2)
Chloride: 100 mmol/L (ref 96–106)
Creatinine, Ser: 1.12 mg/dL (ref 0.76–1.27)
GFR calc Af Amer: 77 mL/min/{1.73_m2} (ref >59)
GFR calc non Af Amer: 66 mL/min/{1.73_m2} (ref >59)
Globulin, Total: 2.5 g/dL (ref 1.5–4.5)
Glucose: 260 mg/dL — ABNORMAL HIGH (ref 65–99)
Potassium: 5.6 mmol/L — ABNORMAL HIGH (ref 3.5–5.2)
Sodium: 142 mmol/L (ref 134–144)
Total Protein: 7.4 g/dL (ref 6.0–8.5)

## 2019-07-16 LAB — CBC WITH DIFFERENTIAL/PLATELET
Basophils Absolute: 0.1 10*3/uL (ref 0.0–0.2)
Basos: 1 %
EOS (ABSOLUTE): 0.3 10*3/uL (ref 0.0–0.4)
Eos: 4 %
Hematocrit: 44.1 % (ref 37.5–51.0)
Hemoglobin: 14.4 g/dL (ref 13.0–17.7)
Immature Grans (Abs): 0 10*3/uL (ref 0.0–0.1)
Immature Granulocytes: 0 %
Lymphocytes Absolute: 2.4 10*3/uL (ref 0.7–3.1)
Lymphs: 31 %
MCH: 27.9 pg (ref 26.6–33.0)
MCHC: 32.7 g/dL (ref 31.5–35.7)
MCV: 85 fL (ref 79–97)
Monocytes Absolute: 0.6 10*3/uL (ref 0.1–0.9)
Monocytes: 7 %
Neutrophils Absolute: 4.5 10*3/uL (ref 1.4–7.0)
Neutrophils: 57 %
Platelets: 217 10*3/uL (ref 150–450)
RBC: 5.17 x10E6/uL (ref 4.14–5.80)
RDW: 13.6 % (ref 11.6–15.4)
WBC: 7.8 10*3/uL (ref 3.4–10.8)

## 2019-07-16 LAB — TSH: TSH: 2.08 u[IU]/mL (ref 0.450–4.500)

## 2019-07-16 NOTE — Progress Notes (Signed)
Spoke with patient regarding blood work results and high potassium.  Patient reports no salt substitute.  No chest pain no shortness of breath.  Advised patient to return to clinic to repeat potassium levels in 1 week.    [name] MD

## 2019-07-16 NOTE — Progress Notes (Addendum)
  Patient Name: Gregory Harrison Date of Birth: March 09, 1951 Date of Visit: 07/15/2019 PCP: [name], MD  Chief Complaint:   Subjective: Gregory Harrison is a pleasant 68 y.o. with medical history significant for hypertension, diabetes, hyperlipidemia, diabetic neuropathy, chronic pain presenting today for Gregory Harrison presents for pain in left shoulder.   Lt Shoulder Pain He has a history of chronic pain in both shoulders, lower back and bilateral knees.  He states that he had his right shoulder injected years ago by physician with 3 different injections and that worked well for him.  He had 1 injection in his left shoulder that worked for small period of time and he would like another injection.  He was seen in pain management clinic a while back and was prescribed hydrocodone for which he takes part sporadically.  He states that he uses diclofenac gel and it works for only 2 hours at a time.  He has no recent trauma or injury to shoulders.  He reports doing a lot of lifting at work.    Dizziness He also reports dizziness when he bends over and stands up. The dizziness is brief lasting about a minute or 2 upon standing.  He denies any chest pain, shortness of breath, headaches, visual disturbances.  He does not know what his sugar levels are at the time of dizziness nor has he taken his blood pressure at that time.    ROS: Per HPI.   I have reviewed the patient's medical, surgical, family, and social history as appropriate.  Vitals:   07/15/19 0855  BP: (!) 160/76  Pulse: (!) 59  SpO2: 98%      Dizziness Patient reports dizziness when bending over and immediately standing up lasting a minute or 2.  He denies any visual changes, headaches chest pain or shortness of breath.  This is most likely secondary to orthostatic hypotension.  I do not think this is neuro related as he has no complaints of headaches, visual disturbance, and gait is intact. We will obtain orthostatic vital signs today.  I have advised the patient to limit use of hydrocodone to bedtime and avoid while driving or using heavy machinery or standing on ladders. I have also asked that he check his blood sugars as well as his blood pressure at home when he feels dizzy.  We will discuss the results at his next visit in 2 weeks.    Pain in joint, shoulder region Patient reported no injury or trauma to the affected joint and states that it has been an ongoing issue therefore I do not think an x-ray is warranted at this time.  Previous x-ray of left shoulder in 2019 showed moderate degenerative changes. I have ordered tramadol 50 mg twice daily starting with 1 tablet at night.  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  He can continue to intermittently use diclofenac gel while working. Follow-up in clinic in 2 weeks.  Weight loss Patient reports weight loss of 25 pounds in about a year and a half.  Unintentional weight loss, no changes in appetite, history of previous tobacco use. Will check a Cmet, TSH and CBC to rule out any metabolic causes or anemia.     Return to care in 2 weeks  [name], MD  [facility] Medicine Teaching Service

## 2019-07-16 NOTE — Assessment & Plan Note (Signed)
Patient reports weight loss of 25 pounds in about a year and a half.  Unintentional weight loss, no changes in appetite, history of previous tobacco use. Will check a Cmet, TSH and CBC to rule out any metabolic causes or anemia.

## 2019-07-16 NOTE — Progress Notes (Signed)
Established Patient Office Visit  Subjective:  Patient ID: Gregory Harrison, male    DOB: 01/01/1951  Age: 68 y.o. MRN: [id number]  CC: Left shoulder pain Chief Complaint  Patient presents with   Follow-up    diabetes    HPI [name] presents for pain in left shoulder.  He has a history of chronic pain in both shoulders, lower back and bilateral knees.  He states that he had his right shoulder injected years ago by physician with 3 different injections and that worked well for him.  He had 1 injection in his left shoulder that worked for small period of time and he would like another injection.  He was seen in pain management clinic a while back and was prescribed hydrocodone for which he takes part sporadically.  He has no recent trauma or injury to shoulders.  He reports doing a lot of lifting at work.  He also reports dizziness when he bends over and stands up.  The dizziness is brief lasting about a minute or 2 upon standing.  He denies any chest pain, shortness of breath, headaches, visual disturbances.  He does not know what his sugar levels are at the time of dizziness nor has he taken his blood pressure at that time. Past Medical History:  Diagnosis Date   Arthritis    2010   Diabetes ([place]) 2000   Diabetic neuropathy ([place])    HTN (hypertension) 2010   Hypercholesterolemia    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 49, blurry vision    Past Surgical History:  Procedure Laterality Date   EYE SURGERY Left 1990s   " to peel off a growth"   HAND SURGERY  1980s   R hand pointer finger    NOSE SURGERY  1979   after broke his nose    Family History  Problem Relation Age of Onset   Heart attack Father        died in 26, had open surgery   Heart murmur Father    Other Mother        bladder taken out for unknown reason. unspecified brain tumor   ALS Brother    Diabetes Paternal Grandfather     Social History   Socioeconomic History   Marital  status: Married    Spouse name: Not on file   Number of children: Not on file   Years of education: Not on file   Highest education level: Not on file  Occupational History   Occupation: retired  Scientist, product/process development strain: Not on file   Food insecurity    Worry: Not on file    Inability: Not on Lexicographer needs    Medical: Not on file    Non-medical: Not on file  Tobacco Use   Smoking status: Former Smoker    Packs/day: 2.00    Years: 20.00    Pack years: 40.00   Smokeless tobacco: Never Used  Substance and Sexual Activity   Alcohol use: No    Frequency: Never    Comment: quit in 1995   Drug use: Yes    Types: Marijuana    Comment: marijuana pills for pain, going to quit.    Sexual activity: Yes    Partners: Female  Lifestyle   Physical activity    Days per week: Not on file    Minutes per session: Not on file   Stress: Not on file  Relationships   Social Herbalist on phone: Not on file    Gets together: Not on file    Attends religious service: Not on file    Active member of club or organization: Not on file    Attends meetings of clubs or organizations: Not on file    Relationship status: Not on file   Intimate partner violence    Fear of current or ex partner: Not on file    Emotionally abused: Not on file    Physically abused: Not on file    Forced sexual activity: Not on file  Other Topics Concern   Not on file  Social History Narrative   Lives with wife, daughter, 3 grandchildren.   Enjoys fishing and hunting and playing pool    Outpatient Medications Prior to Visit  Medication Sig Dispense Refill   acetaminophen (TYLENOL) 500 MG tablet Take 1 tablet (500 mg total) by mouth every 6 (six) hours as needed. (Patient not taking: Reported on 03/24/2019) 30 tablet 0   Alcohol Swabs PADS Use with insulin 100 each 3   Blood Glucose Monitoring Suppl (ONETOUCH VERIO) w/Device KIT 1 each by Does not apply  route as directed. 1 kit 0   glucose blood (ONETOUCH VERIO) test strip Use as instructed 100 each 12   Lancet Devices (ONE TOUCH DELICA LANCING DEV) MISC 1 each by Does not apply route as directed. 1 each 0   naproxen sodium (ALEVE) 220 MG tablet Take 220 mg by mouth as needed.     ONETOUCH DELICA LANCETS FINE MISC 1 each by Does not apply route 3 (three) times daily. 100 each 3   atorvastatin (LIPITOR) 40 MG tablet Take 1 tablet (40 mg total) by mouth daily. 90 tablet 3   diclofenac sodium (VOLTAREN) 1 % GEL Apply 2 g topically 4 (four) times daily. 100 g 0   empagliflozin (JARDIANCE) 25 MG TABS tablet Take 25 mg by mouth daily. 30 tablet    gabapentin (NEURONTIN) 400 MG capsule Take 1 capsule (400 mg total) by mouth 3 (three) times daily. 90 capsule 3   hydrochlorothiazide (HYDRODIURIL) 12.5 MG tablet Take 1 tablet (12.5 mg total) by mouth daily. 90 tablet 3   Insulin Glargine (BASAGLAR KWIKPEN) 100 UNIT/ML SOPN Inject 0.4 mLs (40 Units total) into the skin daily.     losartan (COZAAR) 25 MG tablet Take 1 tablet (25 mg total) by mouth daily. 90 tablet 3   metFORMIN (GLUCOPHAGE XR) 500 MG 24 hr tablet Take 2 tablets (1,000 mg total) by mouth 2 (two) times daily. 360 tablet 3   No facility-administered medications prior to visit.     No Known Allergies  ROS Review of Systems  Constitutional: Negative for appetite change, chills, fever and unexpected weight change.  Eyes: Negative for visual disturbance.  Respiratory: Negative for cough and shortness of breath.   Cardiovascular: Negative for chest pain, palpitations and leg swelling.  Gastrointestinal: Negative for abdominal pain.  Musculoskeletal: Positive for arthralgias and back pain. Negative for gait problem, joint swelling, neck pain and neck stiffness.  Neurological: Positive for dizziness. Negative for weakness, light-headedness and headaches.  Psychiatric/Behavioral: Negative for confusion.      Objective:      Physical Exam  Constitutional: He is oriented to person, place, and time. He appears well-developed.  Neck: Normal range of motion and full passive range of motion without pain. No spinous process tenderness present. Normal range of motion present.  Cardiovascular: Normal rate,  regular rhythm, normal heart sounds and intact distal pulses.  Pulmonary/Chest: Effort normal and breath sounds normal.  Abdominal: Soft. Bowel sounds are normal.  Musculoskeletal:     Right shoulder: He exhibits crepitus.     Left shoulder: He exhibits decreased range of motion, crepitus and pain.     Left elbow: Normal.  Neurological: He is alert and oriented to person, place, and time.  Skin: Skin is warm.  Psychiatric: He has a normal mood and affect. His behavior is normal.    BP (!) 160/76    Pulse (!) 59    Ht '6\' 2"'  (1.88 m)    Wt 181 lb 4 oz (82.2 kg)    SpO2 98%    BMI 23.27 kg/m  Wt Readings from Last 3 Encounters:  07/15/19 181 lb 4 oz (82.2 kg)  02/17/19 189 lb (85.7 kg)  01/11/19 191 lb (86.6 kg)     Health Maintenance Due  Topic Date Due   Hepatitis C Screening  [birth year]   OPHTHALMOLOGY EXAM  06/09/1959   TETANUS/TDAP  06/08/1968   COLONOSCOPY  06/09/1999   FOOT EXAM  03/10/2019   INFLUENZA VACCINE  07/10/2019    There are no preventive care reminders to display for this patient.  Lab Results  Component Value Date   TSH 2.080 07/15/2019   Lab Results  Component Value Date   WBC 7.8 07/15/2019   HGB 14.4 07/15/2019   HCT 44.1 07/15/2019   MCV 85 07/15/2019   PLT 217 07/15/2019   Lab Results  Component Value Date   NA 142 07/15/2019   K 5.6 (H) 07/15/2019   CO2 23 07/15/2019   GLUCOSE 260 (H) 07/15/2019   BUN 21 07/15/2019   CREATININE 1.12 07/15/2019   BILITOT 0.6 07/15/2019   ALKPHOS 64 07/15/2019   AST 16 07/15/2019   ALT 20 07/15/2019   PROT 7.4 07/15/2019   ALBUMIN 4.9 (H) 07/15/2019   CALCIUM 10.6 (H) 07/15/2019   Lab Results  Component Value Date    CHOL 178 11/02/2018   Lab Results  Component Value Date   HDL 41 11/02/2018   Lab Results  Component Value Date   LDLCALC 101 (H) 11/02/2018   Lab Results  Component Value Date   TRIG 182 (H) 11/02/2018   Lab Results  Component Value Date   CHOLHDL 4.3 11/02/2018   Lab Results  Component Value Date   HGBA1C 9.1 (A) 07/15/2019      Assessment & Plan:   Problem List Items Addressed This Visit      Cardiovascular and Mediastinum   HTN (hypertension)   Relevant Medications   atorvastatin (LIPITOR) 40 MG tablet   losartan (COZAAR) 25 MG tablet   hydrochlorothiazide (HYDRODIURIL) 12.5 MG tablet     Endocrine   Diabetes (HCC) - Primary   Relevant Medications   atorvastatin (LIPITOR) 40 MG tablet   metFORMIN (GLUCOPHAGE XR) 500 MG 24 hr tablet   losartan (COZAAR) 25 MG tablet   Insulin Glargine (BASAGLAR KWIKPEN) 100 UNIT/ML SOPN   gabapentin (NEURONTIN) 400 MG capsule   empagliflozin (JARDIANCE) 25 MG TABS tablet   Other Relevant Orders   HgB A1c (Completed)   Comprehensive metabolic panel (Completed)   POCT URINALYSIS DIP (CLINITEK)     Other   Hypercholesterolemia   Relevant Medications   atorvastatin (LIPITOR) 40 MG tablet   losartan (COZAAR) 25 MG tablet   hydrochlorothiazide (HYDRODIURIL) 12.5 MG tablet   Dizziness    Patient reports dizziness when  bending over and immediately standing up lasting a minute or 2.  He denies any visual changes, headaches chest pain or shortness of breath.  This is most likely secondary to orthostatic hypotension.  I do not think this is neuro related as he has no complaints of headaches, visual disturbance, and gait is intact. We will obtain orthostatic vital signs today. I have advised the patient to limit use of hydrocodone to bedtime and avoid while driving or using heavy machinery or standing on ladders. I have also asked that he check his blood sugars as well as his blood pressure at home when he feels dizzy.  We will  discuss the results at his next visit in 2 weeks.         Other Visit Diagnoses    Chronic pain in left shoulder       Relevant Medications   gabapentin (NEURONTIN) 400 MG capsule   diclofenac sodium (VOLTAREN) 1 % GEL   traMADol (ULTRAM) 50 MG tablet   Weight loss       Relevant Orders   Comprehensive metabolic panel (Completed)   CBC with Differential (Completed)   TSH (Completed)      Meds ordered this encounter  Medications   atorvastatin (LIPITOR) 40 MG tablet    Sig: Take 1 tablet (40 mg total) by mouth daily.    Dispense:  90 tablet    Refill:  0   metFORMIN (GLUCOPHAGE XR) 500 MG 24 hr tablet    Sig: Take 2 tablets (1,000 mg total) by mouth 2 (two) times daily.    Dispense:  360 tablet    Refill:  3    Changing from IR to ER   losartan (COZAAR) 25 MG tablet    Sig: Take 1 tablet (25 mg total) by mouth daily.    Dispense:  90 tablet    Refill:  3   Insulin Glargine (BASAGLAR KWIKPEN) 100 UNIT/ML SOPN    Sig: Inject 0.4 mLs (40 Units total) into the skin daily.    Dispense:      hydrochlorothiazide (HYDRODIURIL) 12.5 MG tablet    Sig: Take 1 tablet (12.5 mg total) by mouth daily.    Dispense:  90 tablet    Refill:  3   gabapentin (NEURONTIN) 400 MG capsule    Sig: Take 1 capsule (400 mg total) by mouth 3 (three) times daily.    Dispense:  90 capsule    Refill:  3   empagliflozin (JARDIANCE) 25 MG TABS tablet    Sig: Take 25 mg by mouth daily.    Dispense:  30 tablet   diclofenac sodium (VOLTAREN) 1 % GEL    Sig: Apply 2 g topically 4 (four) times daily.    Dispense:  100 g    Refill:  0   traMADol (ULTRAM) 50 MG tablet    Sig: Take 1 tablet (50 mg total) by mouth 2 (two) times daily.    Dispense:  60 tablet    Refill:  0    Follow-up: Return in about 2 weeks (around 07/29/2019) for f/u diabetes.    [name], MD

## 2019-07-16 NOTE — Assessment & Plan Note (Addendum)
Patient reported no injury or trauma to the affected joint and states that it has been an ongoing issue therefore I do not think an x-ray is warranted at this time.  Previous x-ray of left shoulder in 2019 showed moderate degenerative changes. I have ordered tramadol 50 mg twice daily starting with 1 tablet at night.  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  He can continue to intermittently use diclofenac gel while working. Follow-up in clinic in 2 weeks.

## 2019-07-16 NOTE — Assessment & Plan Note (Signed)
Patient reports dizziness when bending over and immediately standing up lasting a minute or 2.  He denies any visual changes, headaches chest pain or shortness of breath.  This is most likely secondary to orthostatic hypotension.  I do not think this is neuro related as he has no complaints of headaches, visual disturbance, and gait is intact. We will obtain orthostatic vital signs today. I have advised the patient to limit use of hydrocodone to bedtime and avoid while driving or using heavy machinery or standing on ladders. I have also asked that he check his blood sugars as well as his blood pressure at home when he feels dizzy.  We will discuss the results at his next visit in 2 weeks.

## 2019-07-26 DIAGNOSIS — E113511 Type 2 diabetes mellitus with proliferative diabetic retinopathy with macular edema, right eye: Secondary | ICD-10-CM | POA: Diagnosis not present

## 2019-07-26 DIAGNOSIS — E113512 Type 2 diabetes mellitus with proliferative diabetic retinopathy with macular edema, left eye: Secondary | ICD-10-CM | POA: Diagnosis not present

## 2019-07-29 ENCOUNTER — Other Ambulatory Visit: Payer: Self-pay

## 2019-07-29 ENCOUNTER — Ambulatory Visit (INDEPENDENT_AMBULATORY_CARE_PROVIDER_SITE_OTHER): Payer: PPO | Admitting: Family Medicine

## 2019-07-29 ENCOUNTER — Encounter: Payer: Self-pay | Admitting: Family Medicine

## 2019-07-29 VITALS — BP 140/68 | HR 67 | Wt 184.8 lb

## 2019-07-29 DIAGNOSIS — E1142 Type 2 diabetes mellitus with diabetic polyneuropathy: Secondary | ICD-10-CM | POA: Diagnosis not present

## 2019-07-29 DIAGNOSIS — M25512 Pain in left shoulder: Secondary | ICD-10-CM | POA: Diagnosis not present

## 2019-07-29 DIAGNOSIS — Z1211 Encounter for screening for malignant neoplasm of colon: Secondary | ICD-10-CM | POA: Diagnosis not present

## 2019-07-29 DIAGNOSIS — Z1159 Encounter for screening for other viral diseases: Secondary | ICD-10-CM

## 2019-07-29 DIAGNOSIS — I10 Essential (primary) hypertension: Secondary | ICD-10-CM | POA: Diagnosis not present

## 2019-07-29 LAB — GLUCOSE, POCT (MANUAL RESULT ENTRY): POC Glucose: 294 mg/dl — AB (ref 70–99)

## 2019-07-29 NOTE — Assessment & Plan Note (Signed)
Patient continues to have left shoulder pain but patient is not taking the recommended dose of tramadol that was previously ordered.  I have recommended that he take the dose that was previously ordered, 50 mg twice daily. I have referred him to pain management for further evaluation of chronic pain.

## 2019-07-29 NOTE — Assessment & Plan Note (Signed)
Blood pressure today 164/76.  Repeat blood pressure 140/68. Continue current medication losartan and hydrochlorothiazide.

## 2019-07-29 NOTE — Assessment & Plan Note (Signed)
CBG today 294 after meal.  Last hemoglobin A1c is 9.1. Recent eye exam completed.  Will obtain records from ophthalmologist. Continue Lantus 25 units daily, pharmacy is working on getting medication through assistance program. Continue metformin 500 mg 2 tablets twice daily. Continue checking blood sugars in the morning as needed. Diabetic foot exam at next visit.

## 2019-07-29 NOTE — Progress Notes (Signed)
  Patient Name: [name] Date of Birth: 08/20/1951 Date of Visit: 07/29/19 PCP: [name], MD  Chief Complaint: Follow-up from last visit with left shoulder pain and blood work.   Subjective: Gregory Harrison is a pleasant 68 y.o. with medical history significant for type 2 diabetes, diabetic neuropathy, hypertension, arthritis presenting today for follow-up left shoulder pain and repeat blood work.  Left shoulder pain Patient continues to have left shoulder pain especially at work.  He is currently taking tramadol 50 mg at night.  Prescribed dose is 50 mg twice a day.  He states that the tramadol does help level of the pain.  He is still able to work and perform activities of daily living.  Type 2 diabetes Patient currently taking Lantus 20 units once daily.  He states that he will soon run out of medication and he cannot afford to purchase new refill.  He states that his morning sugars run between 130s-160s.  He denies any symptoms of low blood sugar.  He has recently continued his metformin to the original prescribed dose of 1000 mg twice daily.  He had self decreased his metformin to 500 mg once daily.  High potassium Potassium from July 15, 2019 was 5.6.  Patient was notified and was to come to clinic last week for repeat blood work.  He denies any shortness of breath or chest pain.  Hypertension Blood pressure on arrival was 164/76.  Patient states he had just taken his blood pressure medication but normally his blood pressure is well controlled on his current medication.   ROS: Per HPI.  I have reviewed the patient's medical, surgical, family, and social history as appropriate.  Vitals:   07/29/19 0905 07/29/19 1003  BP: (!) 164/76 140/68  Pulse: 67   SpO2: 96%     General: Alert and oriented, no apparent distress  Cardiovascular: RRR with no murmurs noted Respiratory: CTA bilaterally     Pain in joint, shoulder region Patient continues to have left shoulder pain but  patient is not taking the recommended dose of tramadol that was previously ordered.  I have recommended that he take the dose that was previously ordered, 50 mg twice daily. I have referred him to pain management for further evaluation of chronic pain.   Diabetes ([place]) CBG today 294 after meal.  Last hemoglobin A1c is 9.1. Recent eye exam completed.  Will obtain records from ophthalmologist. Continue Lantus 25 units daily, pharmacy is working on getting medication through assistance program. Continue metformin 500 mg 2 tablets twice daily. Continue checking blood sugars in the morning as needed. Diabetic foot exam at next visit.  HTN (hypertension) Blood pressure today 164/76.  Repeat blood pressure 140/68. Continue current medication losartan and hydrochlorothiazide.  Hyperkalemia Repeat be met today. We will call patient if results are abnormal.  May have to consider changing losartan if potassium remains elevated.  Health maintenance Last colonoscopy in 2004.  Will order Cologuard. Follow-up diabetic foot exam at next clinic. Hepatitis C blood work for screening.  Return to care in 4 weeks or sooner if needed.  Discussed at next visit testosterone level.  [name], MD  Family Medicine Teaching Service

## 2019-07-29 NOTE — Patient Instructions (Addendum)
It was a pleasure seeing you again today,  Increase your tramadol to 50 mg twice daily. I will refer you to pain management clinic they will call you with an appointment.  Continue Lantus 25 units daily and metformin 500 mg 2 tablets twice a day. Continue to check your sugars in the morning.  If you have any signs and symptoms of low blood sugar please call clinic.  Check blood pressures periodically for the next 2 weeks and record values.  We will review them at next visit.  We will draw some blood work today if the results are abnormal I will call you, if not we will discuss results at your next visit.  For health maintenance we will order Cologuard today.  I will also check hepatitis C with your blood work today as well for screening.  Friendly reminder that flu vaccine will be offered soon.  Pharmacy will arrange for your insulin pens.  If you have any questions please call the clinic and discussed with pharmacist.  Follow-up in 4 weeks and we will do diabetic foot exam. [name], MD

## 2019-07-30 ENCOUNTER — Telehealth: Payer: Self-pay | Admitting: Pharmacist

## 2019-07-30 LAB — BASIC METABOLIC PANEL
BUN/Creatinine Ratio: 16 (ref 10–24)
BUN: 14 mg/dL (ref 8–27)
CO2: 19 mmol/L — ABNORMAL LOW (ref 20–29)
Calcium: 9.8 mg/dL (ref 8.6–10.2)
Chloride: 103 mmol/L (ref 96–106)
Creatinine, Ser: 0.87 mg/dL (ref 0.76–1.27)
GFR calc Af Amer: 101 mL/min/{1.73_m2} (ref >59)
GFR calc non Af Amer: 87 mL/min/{1.73_m2} (ref >59)
Glucose: 198 mg/dL — ABNORMAL HIGH (ref 65–99)
Potassium: 4.5 mmol/L (ref 3.5–5.2)
Sodium: 141 mmol/L (ref 134–144)

## 2019-07-30 LAB — HCV COMMENT:

## 2019-07-30 LAB — HEPATITIS C ANTIBODY (REFLEX): HCV Ab: <0.1 s/co ratio (ref 0.0–0.9)

## 2019-07-30 NOTE — Telephone Encounter (Signed)
Patient Assistance Program Note 3 (07/29/19)  Patient: Gregory Harrison DOB: [birth year] Medication (Patient Assistance Program): Basaglar ([place]) Enrollment Date: 01/06/2019 - 12/09/2019 Shipping Address: [place], [place] [address] PCP: [name]  Pt seen in clinic today by new PCP Dr. [name]. Pt currently is taking 25 units of insulin glargine (Lantus) daily, however is unable to afford a refill of insulin. It appears that patient has not received refill of insulin glargine (Basaglar) from [employer] due to incomplete fax request for refill request in 05/2019 (see scanned image via media from document uploaded on  05/13/2019 titled [place]). Due to how pt is now seeing new PCP, Dr. [name], rather than Dr. Shawna Orleans, [facility] requires additional documentation provided to be able to appropriately submit refill request. Specifically, [facility] provider/prescriber section of the PAP application (specifically, pages 5 and 6) to be submitted.   Additional documentation completed and signed by Dr. Volanda Napoleon then faxed to [facility] on 07/29/19.  Drexel Iha, PharmD  PGY2 Ambulatory Care Pharmacy Resident    Notes from 2019-2020 PGY2 Ambulatory Care Pharmacy Resident (listed below for continuity of care)   [place] [facility]) Care Management (Note 2)   12/23/2018   Gregory Harrison 11-22-1951 [id number]     Received message from Dr. Shawna Orleans. She is OK with proceeding with the Basaglar application. Will route note to [facility], CPhT to prepare the application. I will bring it to [facility] with me tomorrow for patient's signature and Dr. Lora Havens completion.    Patient verbalized understanding and plans to bring Medicare Extra Help paperwork by clinic tomorrow for me.    [name], PharmD PGY2 Ambulatory Care Pharmacy Resident, [facility] Phone: [phone]  [place] [facility]) Care Management (Note  1)   12/23/2018   Gregory Harrison [birth year] [id number]   Contacted [employer] - patient was approved for [name] Category 4: $85 annual deductible, and 15% of prescription costs up to $5100 out of pocket; after that, generic copays will be $3.40 and brand will be $8.50.    Contacted patient; he is going to drop off a copy of the [name] partial approval letter to [facility] tomorrow. We will fax a copy into [place] to see if they will continue to provide Jardiance through patient assistance.    Patient also notes that he ran out of [place] samples, and switched back to taking Novolin N, 20 units once daily with breakfast. He has only been checking fasting blood sugars, and reports a 7 day average of 151. He notes some episodes of symptomatic hypoglycemia (sweating) if his blood sugars are less than 100. He notes that he is continuing to take Jardiance daily, and has about 15 day supply left. I do not believe we will be able to get appropriate A1c control without hypoglycemia with Novolin N, especially since patient is only taking the medication once daily.    [facility]; they report that the copay for a 36 day supply of [place] Aas is $90.  Unfortunately, his copay for this medication was already cheaper than the reduction applied through Medicare Extra Help Category 4.    Tyler Aas patient assistance through [employer] is currently requiring that the patient pay for the prescription with their insurance at least once, then submit the application with a letter explaining their financial hardship. I don't believe the patient will be able to afford this copay.  Other preferred basal insulins on HTA include Lantus, copay would be the same as [place] on his insurance. [employer] is requiring that patients spend 2% of their yearly household income on prescription medications at the pharmacy prior to submitting for Lantus patient assistance.    However, we could pursue  [employer] patient assistance through [employer]. They currently are not requiring an out of pocket spend for patient assistance, and accept patients with partial low income subsidy.    Will message PCP Dr. Shawna Orleans regarding [place] patient assistance. If she is OK with submitting this, we will prepare the patient portion for Mr. Sawin to sign when he drops by clinic tomorrow, and will prepare prescriber portion for her signature.      [name], PharmD PGY2 Ambulatory Care Pharmacy Resident, [facility] Phone: [phone]

## 2019-08-09 DIAGNOSIS — Z1212 Encounter for screening for malignant neoplasm of rectum: Secondary | ICD-10-CM | POA: Diagnosis not present

## 2019-08-09 DIAGNOSIS — E113512 Type 2 diabetes mellitus with proliferative diabetic retinopathy with macular edema, left eye: Secondary | ICD-10-CM | POA: Diagnosis not present

## 2019-08-09 DIAGNOSIS — Z1211 Encounter for screening for malignant neoplasm of colon: Secondary | ICD-10-CM | POA: Diagnosis not present

## 2019-08-09 LAB — COLOGUARD: Cologuard: POSITIVE — AB

## 2019-08-10 ENCOUNTER — Telehealth: Payer: Self-pay | Admitting: Family Medicine

## 2019-08-10 NOTE — Telephone Encounter (Signed)
Pt is calling and would like a refill on his insulin. He also stated that he needed to find out why he has to pay for this when he has never paid in the past. jw

## 2019-08-12 ENCOUNTER — Other Ambulatory Visit: Payer: Self-pay

## 2019-08-12 NOTE — Telephone Encounter (Signed)
Reviewed: I agree with Dr. Koval's documentation and management. 

## 2019-08-12 NOTE — Telephone Encounter (Signed)
Pt received a letter from [facility] saying he should stop taking his metformin. Please call pt to discuss what he should do. [id number]   Gregory Harrison, CMA

## 2019-08-12 NOTE — Telephone Encounter (Signed)
Contacted patient in follow-up of notification of Metformin Recall notification from [facility]. Phone interview conducted by Murlean Iba, PharmD candidate.   Patient contacted and communicated need to return to pharmacy for "replacement supply".  Patient verbalized frustration over recall.  He reports that lately he has been "getting dizzy" and believes it may be the metformin.  He also expressed concern that this medication may be contaminated and did NOT wish to "give it back".   He was borderline, disrespectful and inappropriate.  He did express desire to talk about this with his PCP.  Advised again that the pharmacy should be able to handle his concerns and he should [facility].   At the end of the call it was unclear if he would return his supply to [facility].  He may after a call from Dr. Volanda Napoleon.

## 2019-08-13 NOTE — Telephone Encounter (Signed)
Error

## 2019-08-23 DIAGNOSIS — E113511 Type 2 diabetes mellitus with proliferative diabetic retinopathy with macular edema, right eye: Secondary | ICD-10-CM | POA: Diagnosis not present

## 2019-08-30 DIAGNOSIS — E113512 Type 2 diabetes mellitus with proliferative diabetic retinopathy with macular edema, left eye: Secondary | ICD-10-CM | POA: Diagnosis not present

## 2019-09-03 ENCOUNTER — Other Ambulatory Visit: Payer: Self-pay

## 2019-09-03 ENCOUNTER — Ambulatory Visit (INDEPENDENT_AMBULATORY_CARE_PROVIDER_SITE_OTHER): Payer: PPO | Admitting: Family Medicine

## 2019-09-03 ENCOUNTER — Encounter: Payer: Self-pay | Admitting: Gastroenterology

## 2019-09-03 DIAGNOSIS — E1142 Type 2 diabetes mellitus with diabetic polyneuropathy: Secondary | ICD-10-CM

## 2019-09-03 DIAGNOSIS — R195 Other fecal abnormalities: Secondary | ICD-10-CM | POA: Diagnosis not present

## 2019-09-03 DIAGNOSIS — G8929 Other chronic pain: Secondary | ICD-10-CM | POA: Diagnosis not present

## 2019-09-03 DIAGNOSIS — R634 Abnormal weight loss: Secondary | ICD-10-CM | POA: Diagnosis not present

## 2019-09-03 MED ORDER — TRAMADOL HCL 50 MG PO TABS: 50.0000 mg | ORAL_TABLET | Freq: Two times a day (BID) | ORAL | 0 refills | Status: AC

## 2019-09-03 MED ORDER — MELOXICAM 7.5 MG PO TABS: 7.5000 mg | ORAL_TABLET | Freq: Every day | ORAL | 0 refills | Status: AC

## 2019-09-03 MED ORDER — INSULIN GLARGINE 100 UNIT/ML SOLOSTAR PEN: 100.0000 [IU] | PEN_INJECTOR | SUBCUTANEOUS | 0 refills | Status: DC

## 2019-09-03 NOTE — Patient Instructions (Addendum)
It was a pleasure seeing you today,  I will schedule an appointment with gastroenterologist for colonoscopy.  For your diabetes I have given you to samples of Lantus pen.  You start 25 units daily.  I will fax another prescription for you to receive your Basaglar KwikPen.  Once you receive your new prescription continue with 25 units daily.  Start meloxicam 7.5 mg once a day.  Do not take any ibuprofen or naproxen while you are taking this medication.  If you develop any signs of bleeding such as blood in your stool or urine please stop medication and notify the clinic.  Please call pain clinic to book an appointment. Ph# [phone]  [name] MD

## 2019-09-03 NOTE — Progress Notes (Signed)
  Patient Name: Gregory Harrison Date of Birth: 12-11-1950 Date of Visit: 09/05/19 PCP: [name], MD  Chief Complaint:   Subjective: [name] is a pleasant 68 y.o. with medical history significant for diabetes mellitus 2, chronic joint pain, hypertension presenting today for medication refill and diabetic foot exam.   Diabetes -Patient has not had his insulin pen for last month.  Started taking Humalog 30/70 14 units daily.  Sugars have been in the 200s.  Still taking metformin extended release 500 mg 2 tablets twice daily.  [facility] who told him that the metformin tablets he is currently taking has not been recalled by the manufacturer.  He was [facility] by [facility] to resume medications.  He complains of no foot pain, ulcers, scratches, decrease in station.  Chronic joint pain -Patient states that pain in shoulders and back has gotten a little worse.  He has not seen been seen by pain clinic as of yet.  He states he has tried to call but has not been able to get any response.  He is requesting pain control with tramadol as this helps the pain for 2 to 3 hours.  He is still able to function on a daily basis, he states that he is still able to work but finds it difficulty after work with increasing pain.  Weight loss -Patient states that he has had about 30 pound weight loss in the last 3 years.  He has no decrease in appetite, no nausea or vomiting, no abdominal pain.  He denies any rectal bleeding, any hematuria.  PHQ score 3 ROS: Per HPI.   I have reviewed the patient's medical, surgical, family, and social history as appropriate.  There were no vitals filed for this visit.  General: Alert and oriented, no apparent distress  Cardiovascular: RRR with no murmurs noted Respiratory: CTA bilaterally  Gastrointestinal: Bowel sounds present. No abdominal pain Foot exam: Normal    Diabetes ([place]) 2 sample pens of Lantus supplied.  Patient to stop Humalog.  Advised patient to start  Lantus pens 25 units daily.  Refaxed prescription to [facility].  Advised patient to call when halfway through second pen if his prescription supply has not been received. Continue metformin  Follow-up in 3 months or sooner if needed.  Chronic pain Trial of meloxicam 7.5 mg daily for 2 week.  Noted that this medication is on the beers list.  Discussed the risk of cardiovascular events, increased blood pressure.  Advised to stay away from any other NSAIDs ibuprofen Advil, naproxen.  Tramadol 50 mg twice daily x15 tablets no refills. Phone number for pain clinic given to patient to follow-up with appointment.  Weight loss Weight loss occurred over the last 3 years when he initially started insulin.  However recent Cologuard resulted positive.  Referral to GI for further work-up to rule out any GI cause of malignancy.   Return to care in 3 months or sooner if needed  [name], MD  [facility] Medicine Teaching Service

## 2019-09-05 ENCOUNTER — Encounter: Payer: Self-pay | Admitting: Family Medicine

## 2019-09-05 NOTE — Assessment & Plan Note (Signed)
Trial of meloxicam 7.5 mg daily for 2 week.  Noted that this medication is on the beers list.  Discussed the risk of cardiovascular events, increased blood pressure.  Advised to stay away from any other NSAIDs ibuprofen Advil, naproxen.  Tramadol 50 mg twice daily x15 tablets no refills. Phone number for pain clinic given to patient to follow-up with appointment.

## 2019-09-05 NOTE — Assessment & Plan Note (Signed)
2 sample pens of Lantus supplied.  Patient to stop Humalog.  Advised patient to start Lantus pens 25 units daily.  Refaxed prescription to [facility].  Advised patient to call when halfway through second pen if his prescription supply has not been received. Continue metformin  Follow-up in 3 months or sooner if needed.

## 2019-09-05 NOTE — Assessment & Plan Note (Signed)
Weight loss occurred over the last 3 years when he initially started insulin.  However recent Cologuard resulted positive.  Referral to GI for further work-up to rule out any GI cause of malignancy.

## 2019-09-06 DIAGNOSIS — E113511 Type 2 diabetes mellitus with proliferative diabetic retinopathy with macular edema, right eye: Secondary | ICD-10-CM | POA: Diagnosis not present

## 2019-09-13 DIAGNOSIS — E113512 Type 2 diabetes mellitus with proliferative diabetic retinopathy with macular edema, left eye: Secondary | ICD-10-CM | POA: Diagnosis not present

## 2019-09-22 ENCOUNTER — Ambulatory Visit (AMBULATORY_SURGERY_CENTER): Payer: PPO | Admitting: *Deleted

## 2019-09-22 ENCOUNTER — Telehealth: Payer: Self-pay | Admitting: Pharmacist

## 2019-09-22 ENCOUNTER — Other Ambulatory Visit: Payer: Self-pay

## 2019-09-22 ENCOUNTER — Other Ambulatory Visit: Payer: Self-pay | Admitting: Gastroenterology

## 2019-09-22 VITALS — Temp 97.3°F | Ht 74.0 in | Wt 183.4 lb

## 2019-09-22 DIAGNOSIS — Z1159 Encounter for screening for other viral diseases: Secondary | ICD-10-CM

## 2019-09-22 MED ORDER — PEG 3350-KCL-NA BICARB-NACL 420 G PO SOLR: 4000.0000 mL | Freq: Once | ORAL | 0 refills | Status: DC

## 2019-09-22 NOTE — Progress Notes (Signed)
No egg or soy allergy known to patient  No issues with past sedation with any surgeries  or procedures, no intubation problems  No diet pills per patient No home 02 use per patient  No blood thinners per patient  Pt denies issues with constipation  No A fib or A flutter  EMMI video sent to pt's e mail   Due to the COVID-19 pandemic we are asking patients to follow these guidelines. Please only bring one care partner. Please be aware that your care partner may wait in the car in the parking lot or if they feel like they will be too hot to wait in the car, they may wait in the lobby on the 4th floor. All care partners are required to wear a mask the entire time (we do not have any that we can provide them), they need to practice social distancing, and we will do a Covid check for all patient's and care partners when you arrive. Also we will check their temperature and your temperature. If the care partner waits in their car they need to stay in the parking lot the entire time and we will call them on their cell phone when the patient is ready for discharge so they can bring the car to the front of the building. Also all patient's will need to wear a mask into building.   covid screening 10/01/19 @ 8:20 am

## 2019-09-22 NOTE — Telephone Encounter (Signed)
Called pt at 1:55 PM on 09/22/19.  Pt still has not received insulin refill (approved for Basaglar through [employer]) via mail after refills were sent in 07/29/19 by myself and 09/03/19 by Dr. Volanda Napoleon. Will contact [facility] for further assistance.

## 2019-09-22 NOTE — Telephone Encounter (Signed)
Refaxed refill fr

## 2019-09-24 ENCOUNTER — Encounter: Payer: Self-pay | Admitting: Gastroenterology

## 2019-09-27 DIAGNOSIS — E113511 Type 2 diabetes mellitus with proliferative diabetic retinopathy with macular edema, right eye: Secondary | ICD-10-CM | POA: Diagnosis not present

## 2019-09-30 ENCOUNTER — Other Ambulatory Visit: Payer: Self-pay | Admitting: Family Medicine

## 2019-09-30 ENCOUNTER — Telehealth: Payer: Self-pay | Admitting: Pharmacist

## 2019-09-30 DIAGNOSIS — E1142 Type 2 diabetes mellitus with diabetic polyneuropathy: Secondary | ICD-10-CM

## 2019-09-30 NOTE — Telephone Encounter (Signed)
Called pt on 09/30/19 at 12:50 PM.   Pt confirmed that he received 2 boxes of Basaglar pens in the mail yesterday (09/29/19) from [facility]. Pt states his BG this morning was 162. His fasting blood glucose ranges between 140-160. He confirms he has not had any episodes of hypoglycemia. He states he takes insulin glargine 25 units daily in the morning. Per Dr. Loistine Chance last note, recommend pt follow up with Dr. Volanda Napoleon in 11/2019 to f/u about DM management. Recommended pt call and schedule appt with Dr. Volanda Napoleon; pt verbalized understanding.  [employer] representative stated patient was approved for [employer] from 01/06/2019 to 12/09/2019. He will have to re-submit documentation for 2021 year between 10/10/19-10/16/19. Will f/u with [facility] to determine if [facility] will be coordinating [employer] paperwork.

## 2019-10-01 ENCOUNTER — Telehealth: Payer: Self-pay | Admitting: *Deleted

## 2019-10-01 DIAGNOSIS — Z1159 Encounter for screening for other viral diseases: Secondary | ICD-10-CM

## 2019-10-01 NOTE — Telephone Encounter (Signed)
covid order put in again per [facility] request

## 2019-10-02 ENCOUNTER — Other Ambulatory Visit (HOSPITAL_COMMUNITY)
Admission: RE | Admit: 2019-10-02 | Discharge: 2019-10-02 | Disposition: A | Discharge status: Home / Self Care | Payer: PPO | Source: Ambulatory Visit | Attending: Gastroenterology | Admitting: Gastroenterology

## 2019-10-02 DIAGNOSIS — Z01812 Encounter for preprocedural laboratory examination: Secondary | ICD-10-CM | POA: Insufficient documentation

## 2019-10-02 DIAGNOSIS — Z20828 Contact with and (suspected) exposure to other viral communicable diseases: Secondary | ICD-10-CM | POA: Insufficient documentation

## 2019-10-03 LAB — NOVEL CORONAVIRUS, NAA (HOSP ORDER, SEND-OUT TO REF LAB; TAT 18-24 HRS): SARS-CoV-2, NAA: NOT DETECTED

## 2019-10-06 ENCOUNTER — Other Ambulatory Visit: Payer: Self-pay

## 2019-10-06 ENCOUNTER — Ambulatory Visit (AMBULATORY_SURGERY_CENTER): Payer: PPO | Admitting: Gastroenterology

## 2019-10-06 ENCOUNTER — Encounter: Payer: Self-pay | Admitting: Gastroenterology

## 2019-10-06 VITALS — BP 188/89 | HR 61 | Temp 97.9°F | Resp 10 | Ht 74.0 in | Wt 183.0 lb

## 2019-10-06 DIAGNOSIS — Z1211 Encounter for screening for malignant neoplasm of colon: Secondary | ICD-10-CM

## 2019-10-06 DIAGNOSIS — D125 Benign neoplasm of sigmoid colon: Secondary | ICD-10-CM

## 2019-10-06 DIAGNOSIS — D122 Benign neoplasm of ascending colon: Secondary | ICD-10-CM

## 2019-10-06 DIAGNOSIS — D123 Benign neoplasm of transverse colon: Secondary | ICD-10-CM

## 2019-10-06 MED ORDER — SODIUM CHLORIDE 0.9 % IV SOLN: 500.0000 mL | Freq: Once | INTRAVENOUS | Status: DC

## 2019-10-06 NOTE — Progress Notes (Addendum)
Pt's states no medical or surgical changes since previsit or office visit.  JB temps, CW vitals and EW IV.

## 2019-10-06 NOTE — Progress Notes (Signed)
To PACU, VSS. Report to [email or web address] 

## 2019-10-06 NOTE — Patient Instructions (Signed)
Handout on polyps, diverticulosis   YOU HAD AN ENDOSCOPIC PROCEDURE TODAY AT [facility]:   Refer to the procedure report that was given to you for any specific questions about what was found during the examination.  If the procedure report does not answer your questions, please call your gastroenterologist to clarify.  If you requested that your care partner not be given the details of your procedure findings, then the procedure report has been included in a sealed envelope for you to review at your convenience later.  YOU SHOULD EXPECT: Some feelings of bloating in the abdomen. Passage of more gas than usual.  Walking can help get rid of the air that was put into your GI tract during the procedure and reduce the bloating. If you had a lower endoscopy (such as a colonoscopy or flexible sigmoidoscopy) you may notice spotting of blood in your stool or on the toilet paper. If you underwent a bowel prep for your procedure, you may not have a normal bowel movement for a few days.  Please Note:  You might notice some irritation and congestion in your nose or some drainage.  This is from the oxygen used during your procedure.  There is no need for concern and it should clear up in a day or so.  SYMPTOMS TO REPORT IMMEDIATELY:   Following lower endoscopy (colonoscopy or flexible sigmoidoscopy):  Excessive amounts of blood in the stool  Significant tenderness or worsening of abdominal pains  Swelling of the abdomen that is new, acute  Fever of 100F or higher   For urgent or emergent issues, a gastroenterologist can be reached at any hour by calling [phone].   DIET:  We do recommend a small meal at first, but then you may proceed to your regular diet.  Drink plenty of fluids but you should avoid alcoholic beverages for 24 hours.  ACTIVITY:  You should plan to take it easy for the rest of today and you should NOT DRIVE or use heavy machinery until tomorrow (because of the  sedation medicines used during the test).    FOLLOW UP: Our staff will call the number listed on your records 48-72 hours following your procedure to check on you and address any questions or concerns that you may have regarding the information given to you following your procedure. If we do not reach you, we will leave a message.  We will attempt to reach you two times.  During this call, we will ask if you have developed any symptoms of COVID 19. If you develop any symptoms (ie: fever, flu-like symptoms, shortness of breath, cough etc.) before then, please call [phone].  If you test positive for Covid 19 in the 2 weeks post procedure, please call and report this information to [place].    If any biopsies were taken you will be contacted by phone or by letter within the next 1-3 weeks.  Please call us at [phone] if you have not heard about the biopsies in 3 weeks.    SIGNATURES/CONFIDENTIALITY: You and/or your care partner have signed paperwork which will be entered into your electronic medical record.  These signatures attest to the fact that that the information above on your After Visit Summary has been reviewed and is understood.  Full responsibility of the confidentiality of this discharge information lies with you and/or your care-partner.

## 2019-10-06 NOTE — Progress Notes (Signed)
Called to room to assist during endoscopic procedure.  Patient ID and intended procedure confirmed with present staff. Received instructions for my participation in the procedure from the performing physician.  

## 2019-10-06 NOTE — Op Note (Signed)
[facility] Patient Name: Gregory Harrison Procedure Date: 10/06/2019 8:29 AM MRN: [id number] Endoscopist: Mauri Pole , MD Age: 68 Referring MD:  Date of Birth: 1951-10-20 Gender: Male Account #: [id number] Procedure:                Colonoscopy Indications:              Screening for colorectal malignant neoplasm Medicines:                Monitored Anesthesia Care Procedure:                Pre-Anesthesia Assessment:                           - Prior to the procedure, a History and Physical                            was performed, and patient medications and                            allergies were reviewed. The patient's tolerance of                            previous anesthesia was also reviewed. The risks                            and benefits of the procedure and the sedation                            options and risks were discussed with the patient.                            All questions were answered, and informed consent                            was obtained. Prior Anticoagulants: The patient has                            taken no previous anticoagulant or antiplatelet                            agents. ASA Grade Assessment: III - A patient with                            severe systemic disease. After reviewing the risks                            and benefits, the patient was deemed in                            satisfactory condition to undergo the procedure.                           After obtaining informed consent, the colonoscope  was passed under direct vision. Throughout the                            procedure, the patient's blood pressure, pulse, and                            oxygen saturations were monitored continuously. The                            Colonoscope was introduced through the anus and                            advanced to the the cecum, identified by                            appendiceal orifice and  ileocecal valve. The                            colonoscopy was performed without difficulty. The                            patient tolerated the procedure well. The quality                            of the bowel preparation was adequate. The                            ileocecal valve, appendiceal orifice, and rectum                            were photographed. Scope In: 8:36:55 AM Scope Out: 9:00:48 AM Scope Withdrawal Time: 0 hours 17 minutes 55 seconds  Total Procedure Duration: 0 hours 23 minutes 53 seconds  Findings:                 The perianal and digital rectal examinations were                            normal.                           Two semi-pedunculated polyps were found in the                            sigmoid colon and ascending colon. The polyps were                            15 to 18 mm in size. These polyps were removed with                            a hot snare. Resection and retrieval were complete.                           A 15 mm polyp was found in the transverse colon.  The polyp was sessile. The polyp was removed with a                            piecemeal technique using a cold snare. Resection                            and retrieval were complete.                           Scattered small and large-mouthed diverticula were                            found in the sigmoid colon, descending colon,                            transverse colon and ascending colon.                           Non-bleeding internal hemorrhoids were found during                            retroflexion. The hemorrhoids were medium-sized. Complications:            No immediate complications. Estimated Blood Loss:     Estimated blood loss was minimal. Impression:               - Two 15 to 18 mm polyps in the sigmoid colon and                            in the ascending colon, removed with a hot snare.                            Resected and retrieved.                            - One 15 mm polyp in the transverse colon, removed                            piecemeal using a cold snare. Resected and                            retrieved.                           - Moderate diverticulosis in the sigmoid colon, in                            the descending colon, in the transverse colon and                            in the ascending colon.                           - Non-bleeding internal hemorrhoids. Recommendation:           - Patient has  a contact number available for                            emergencies. The signs and symptoms of potential                            delayed complications were discussed with the                            patient. Return to normal activities tomorrow.                            Written discharge instructions were provided to the                            patient.                           - Resume previous diet.                           - Continue present medications.                           - Await pathology results.                           - Repeat colonoscopy in 1 year for surveillance                            based on pathology results. Mauri Pole, MD 10/06/2019 9:06:18 AM This report has been signed electronically.

## 2019-10-07 ENCOUNTER — Encounter: Payer: Self-pay | Admitting: Student in an Organized Health Care Education/Training Program

## 2019-10-07 ENCOUNTER — Ambulatory Visit
Payer: PPO | Attending: Student in an Organized Health Care Education/Training Program | Admitting: Student in an Organized Health Care Education/Training Program

## 2019-10-07 VITALS — BP 182/81 | HR 61 | Temp 97.5°F | Resp 16 | Ht 74.5 in | Wt 185.8 lb

## 2019-10-07 DIAGNOSIS — G5601 Carpal tunnel syndrome, right upper limb: Secondary | ICD-10-CM | POA: Insufficient documentation

## 2019-10-07 DIAGNOSIS — M47816 Spondylosis without myelopathy or radiculopathy, lumbar region: Secondary | ICD-10-CM | POA: Insufficient documentation

## 2019-10-07 DIAGNOSIS — M19012 Primary osteoarthritis, left shoulder: Secondary | ICD-10-CM | POA: Insufficient documentation

## 2019-10-07 DIAGNOSIS — M65311 Trigger thumb, right thumb: Secondary | ICD-10-CM | POA: Diagnosis not present

## 2019-10-07 DIAGNOSIS — M25512 Pain in left shoulder: Secondary | ICD-10-CM | POA: Insufficient documentation

## 2019-10-07 DIAGNOSIS — G894 Chronic pain syndrome: Secondary | ICD-10-CM | POA: Diagnosis not present

## 2019-10-07 DIAGNOSIS — M5136 Other intervertebral disc degeneration, lumbar region: Secondary | ICD-10-CM | POA: Diagnosis not present

## 2019-10-07 DIAGNOSIS — E1142 Type 2 diabetes mellitus with diabetic polyneuropathy: Secondary | ICD-10-CM | POA: Insufficient documentation

## 2019-10-07 DIAGNOSIS — M170 Bilateral primary osteoarthritis of knee: Secondary | ICD-10-CM | POA: Insufficient documentation

## 2019-10-07 NOTE — Progress Notes (Signed)
Patient's Name: Gregory Harrison  MRN: [id number]  Referring Provider: [name], MD  DOB: November 08, 1951  PCP: [name], MD  DOS: 10/07/2019  Note by: Gillis Santa, MD  Service setting: Ambulatory outpatient  Specialty: Interventional Pain Management  Location: [facility] (AMB) Pain Management Facility  Visit type: Initial Patient Evaluation  Patient type: New Patient   Primary Reason(s) for Visit: Encounter for initial evaluation of one or more chronic problems (new to examiner) potentially causing chronic pain, and posing a threat to normal musculoskeletal function. (Level of risk: High) CC: Generalized Body Aches  HPI  Gregory Harrison is a 68 y.o. year old, male patient, who comes today to see [place] for the first time for an initial evaluation of his chronic pain. He has Hypercholesterolemia; HTN (hypertension); Diabetic neuropathy ([place]); Diabetes ([place]); Arthritis; Chronic pain; Carpal tunnel syndrome of right wrist; Trigger finger of right thumb; Pain in joint, shoulder region; Dizziness; Weight loss; Lumbar facet arthropathy; Lumbar degenerative disc disease; Bilateral primary osteoarthritis of knee; and Primary osteoarthritis of left shoulder on their problem list. Today he comes in for evaluation of his Generalized Body Aches  Pain Assessment: Location:   Back(hands, knees, generalized body aches) Radiating: shooting pains from joints; "feet burn all the time except with nighttime meds" Onset: More than a month ago Duration: Chronic pain Quality: Aching, Numbness, Burning, Shooting Severity: 9 /10 (subjective, self-reported pain score)  Effect on ADL: "in pain all the time", limits daily activities Timing: Constant Modifying factors: meds help BP: (!) 182/81  HR: 61  Onset and Duration: Gradual and Date of onset: 25years Cause of pain: Unknown Severity: NAS-11 at its worse: 9/10, NAS-11 at its best: 9/10, NAS-11 now: 9/10 and NAS-11 on the average: 9/10 Timing: Morning, Noon, Afternoon, Evening,  During activity or exercise and After a period of immobility Aggravating Factors: Bending, Climbing, Kneeling, Motion, Prolonged sitting, Prolonged standing, Squatting, Stooping , Twisting, Walking, Walking uphill, Walking downhill and Working Alleviating Factors: Medications and Nerve blocks Associated Problems: Pain that wakes patient up and Pain that does not allow patient to sleep Quality of Pain: Aching, Burning, Nagging, Sharp and Shooting Previous Examinations or Tests: MRI scan and X-rays Previous Treatments: Narcotic medications  The patient comes into the clinics today for the first time for a chronic pain management evaluation.   Patient is a very pleasant 68 year old male with history of type 2 diabetes, diabetic neuropathy, hypertension, left shoulder arthritis and diffuse musculoskeletal pain.  Patient works as a Curator.  He states that he has left shoulder pain that is worse with shoulder abduction.  He is currently on tramadol 50 mg twice a day which he states is not very effective for him.  He was previously on hydrocodone in the past.  He states that he would take 1 to 2 tablets a week when he had severe pain flare.  Has been compliant with medications.  Patient is a type II diabetic on insulin.  Historic Controlled Substance Pharmacotherapy Review  PMP and historical list of controlled substances:  /05/2019  2   07/15/2019  Tramadol Hcl 50 MG Tablet  60.00  30 Mo Con   [id number]   [place] (2372)   0  10.00 MME  Medicare   [place]   Prior to this was on hydrocodone 5 mg 1 to 2 tablets a week for severe breakthrough pain.  Pharmacodynamics: Desired effects: Analgesia: The patient reports >50% benefit. Reported improvement in function: The patient reports medication allows him to accomplish basic ADLs.  Clinically meaningful improvement in function (CMIF): Sustained CMIF goals met Perceived effectiveness: Described as relatively effective, allowing for increase in activities of daily  living (ADL) Undesirable effects: Side-effects or Adverse reactions: None reported Historical Monitoring: The patient  reports previous drug use. List of all UDS Test(s): No results found for: MDMA, COCAINSCRNUR, [place], [place], CANNABQUANT, THCU, [place] List of other Serum/Urine Drug Screening Test(s):  No results found for: AMPHSCRSER, BARBSCRSER, BENZOSCRSER, COCAINSCRSER, COCAINSCRNUR, PCPSCRSER, PCPQUANT, THCSCRSER, THCU, CANNABQUANT, OPIATESCRSER, OXYSCRSER, PROPOXSCRSER, ETH Historical Background Evaluation: [place] PMP: PDMP reviewed during this encounter. Six (6) year initial data search conducted.             [place] Department of public safety, offender search: Editor, commissioning Information) Non-contributory Risk Assessment Profile: Aberrant behavior: None observed or detected today Risk factors for fatal opioid overdose: None identified today Fatal overdose hazard ratio (HR): Calculation deferred Non-fatal overdose hazard ratio (HR): Calculation deferred Risk of opioid abuse or dependence: 0.7-3.0% with doses ? 36 MME/day and 6.1-26% with doses ? 120 MME/day. Substance use disorder (SUD) risk level: See below Personal History of Substance Abuse (SUD-Substance use disorder):  Alcohol: Negative  Illegal Drugs: Negative  Rx Drugs: Negative  ORT Risk Level calculation: Low Risk Opioid Risk Tool - 10/07/19 0922      Family History of Substance Abuse   Alcohol  Negative    Illegal Drugs  Negative    Rx Drugs  Negative      Personal History of Substance Abuse   Alcohol  Negative    Illegal Drugs  Negative    Rx Drugs  Negative      Age   Age between 73-45 years   No      History of Preadolescent Sexual Abuse   History of Preadolescent Sexual Abuse  Negative or Male      Psychological Disease   Psychological Disease  Negative    Depression  Negative      Total Score   Opioid Risk Tool Scoring  0    Opioid Risk Interpretation  Low Risk      ORT Scoring interpretation table:   Score <3 = Low Risk for SUD  Score between 4-7 = Moderate Risk for SUD  Score >8 = High Risk for Opioid Abuse   PHQ-2 Depression Scale:  Total score:    PHQ-2 Scoring interpretation table: (Score and probability of major depressive disorder)  Score 0 = No depression  Score 1 = 15.4% Probability  Score 2 = 21.1% Probability  Score 3 = 38.4% Probability  Score 4 = 45.5% Probability  Score 5 = 56.4% Probability  Score 6 = 78.6% Probability   PHQ-9 Depression Scale:  Total score:    PHQ-9 Scoring interpretation table:  Score 0-4 = No depression  Score 5-9 = Mild depression  Score 10-14 = Moderate depression  Score 15-19 = Moderately severe depression  Score 20-27 = Severe depression (2.4 times higher risk of SUD and 2.89 times higher risk of overuse)   Pharmacologic Plan: As per protocol, I have not taken over any controlled substance management, pending the results of ordered tests and/or consults.            Initial impression: Pending review of available data and ordered tests.  Meds   Current Outpatient Medications:  .  Alcohol Swabs PADS, Use with insulin, Disp: 100 each, Rfl: 3 .  atorvastatin (LIPITOR) 40 MG tablet, Take 1 tablet (40 mg total) by mouth daily., Disp: 90 tablet, Rfl: 0 .  Blood Glucose Monitoring Suppl (ONETOUCH VERIO) w/Device KIT, 1 each by Does not apply route as directed., Disp: 1 kit, Rfl: 0 .  diclofenac sodium (VOLTAREN) 1 % GEL, Apply 2 g topically 4 (four) times daily., Disp: 100 g, Rfl: 0 .  empagliflozin (JARDIANCE) 25 MG TABS tablet, Take 25 mg by mouth daily., Disp: 30 tablet, Rfl:  .  gabapentin (NEURONTIN) 400 MG capsule, TAKE 1 CAPSULE (400 MG TOTAL) BY MOUTH 3 (THREE) TIMES DAILY., Disp: 270 capsule, Rfl: 2 .  glucose blood (ONETOUCH VERIO) test strip, Use as instructed, Disp: 100 each, Rfl: 12 .  hydrochlorothiazide (HYDRODIURIL) 12.5 MG tablet, Take 1 tablet (12.5 mg total) by mouth daily., Disp: 90 tablet, Rfl: 3 .  Insulin Glargine  (BASAGLAR KWIKPEN) 100 UNIT/ML SOPN, Inject 0.4 mLs (40 Units total) into the skin daily., Disp:  , Rfl:  .  Insulin Glargine (LANTUS) 100 UNIT/ML Solostar Pen, Inject 100 Units into the skin every morning., Disp: 2 pen, Rfl: 0 .  Lancet Devices (ONE TOUCH DELICA LANCING DEV) MISC, 1 each by Does not apply route as directed., Disp: 1 each, Rfl: 0 .  losartan (COZAAR) 25 MG tablet, Take 1 tablet (25 mg total) by mouth daily., Disp: 90 tablet, Rfl: 3 .  meloxicam (MOBIC) 7.5 MG tablet, Take 7.5 mg by mouth daily. Taking for 15 days, Disp: , Rfl:  .  metFORMIN (GLUCOPHAGE XR) 500 MG 24 hr tablet, Take 2 tablets (1,000 mg total) by mouth 2 (two) times daily., Disp: 360 tablet, Rfl: 3 .  MIRALAX 17 GM/SCOOP powder, Please specify directions, refills and quantity, Disp: 255 g, Rfl: 0 .  ONETOUCH DELICA LANCETS FINE MISC, 1 each by Does not apply route 3 (three) times daily., Disp: 100 each, Rfl: 3 .  traMADol (ULTRAM) 50 MG tablet, Take by mouth 2 (two) times daily., Disp: , Rfl:   Imaging Review   Shoulder-L DG:  Results for orders placed during the hospital encounter of 03/24/18  DG Shoulder Left   Narrative CLINICAL DATA:  Remote fall.  Chronic left shoulder pain  EXAM: LEFT SHOULDER - 2+ VIEW  COMPARISON:  None.  FINDINGS: Moderate to advanced osteoarthritic changes in the left glenohumeral and AC joints with joint space narrowing and spurring. No acute bony abnormality. Specifically, no fracture, subluxation, or dislocation.  IMPRESSION: Moderate to advanced degenerative changes in the left shoulder. No acute bony abnormality.   Electronically Signed   By: Rolm Baptise M.D.   On: 03/24/2018 09:53     Results for orders placed during the hospital encounter of 07/31/18  DG Lumbar Spine Complete   Narrative CLINICAL DATA:  Acute onset of right-sided low back pain radiating to the buttock.  EXAM: LUMBAR SPINE - COMPLETE 4+ VIEW  COMPARISON:  None.  FINDINGS: Five lumbar  type vertebral bodies show normal alignment. Disc space heights are normal at L3-4 and above. Mild disc space narrowing at L4-5. Moderate disc space narrowing at L5-S1. No evidence of fracture or focal lesion. There is ordinary lower lumbar facet osteoarthritis.  IMPRESSION: Degenerative disc disease more pronounced at L5-S1 than L4-5. Ordinary lower lumbar facet osteoarthritis.   Electronically Signed   By: Nelson Chimes M.D.   On: 07/31/2018 14:07           Results for orders placed during the hospital encounter of 03/15/15  DG Knee 1-2 Views Right   Narrative CLINICAL DATA:  BILATERAL knee pain or LEFT greater than RIGHT, RIGHT knee pain is chronic while  LEFT knee suffered an injury 2 days ago, displaced patella which was self reduced  EXAM: RIGHT KNEE - 1-2 VIEW; LEFT KNEE - 1-2 VIEW  COMPARISON:  None  FINDINGS: LEFT knee two views:  Osseous demineralization.  Joint space narrowing with minimal spur formation.  No acute fracture, dislocation or bone destruction.  Patellar spurs at patellar tendon and quadriceps tendon insertions.  No knee joint effusion.  RIGHT knee two views:  Osseous demineralization.  Minimal joint space narrowing.  No acute fracture, dislocation or bone destruction.  Small patellar spur at quadriceps tendon insertion.  No knee joint effusion.  IMPRESSION: Osseous demineralization with degenerative changes of both knees slightly greater on LEFT.  No acute abnormalities.   Electronically Signed   By: Lavonia Dana M.D.   On: 03/15/2015 16:05    Knee-L DG 1-2 views:  Results for orders placed during the hospital encounter of 03/15/15  DG Knee 1-2 Views Left   Narrative CLINICAL DATA:  BILATERAL knee pain or LEFT greater than RIGHT, RIGHT knee pain is chronic while LEFT knee suffered an injury 2 days ago, displaced patella which was self reduced  EXAM: RIGHT KNEE - 1-2 VIEW; LEFT KNEE - 1-2 VIEW  COMPARISON:   None  FINDINGS: LEFT knee two views:  Osseous demineralization.  Joint space narrowing with minimal spur formation.  No acute fracture, dislocation or bone destruction.  Patellar spurs at patellar tendon and quadriceps tendon insertions.  No knee joint effusion.  RIGHT knee two views:  Osseous demineralization.  Minimal joint space narrowing.  No acute fracture, dislocation or bone destruction.  Small patellar spur at quadriceps tendon insertion.  No knee joint effusion.  IMPRESSION: Osseous demineralization with degenerative changes of both knees slightly greater on LEFT.  No acute abnormalities.   Electronically Signed   By: Lavonia Dana M.D.   On: 03/15/2015 16:05     Complexity Note: Imaging results reviewed. Results shared with Mr. Ambrosini, using Layman's terms.                         ROS  Cardiovascular: High blood pressure Pulmonary or Respiratory: Snoring  Neurological: No reported neurological signs or symptoms such as seizures, abnormal skin sensations, urinary and/or fecal incontinence, being born with an abnormal open spine and/or a tethered spinal cord Review of Past Neurological Studies: No results found for this or any previous visit. Psychological-Psychiatric: No reported psychological or psychiatric signs or symptoms such as difficulty sleeping, anxiety, depression, delusions or hallucinations (schizophrenial), mood swings (bipolar disorders) or suicidal ideations or attempts Gastrointestinal: Reflux or heatburn Genitourinary: No reported renal or genitourinary signs or symptoms such as difficulty voiding or producing urine, peeing blood, non-functioning kidney, kidney stones, difficulty emptying the bladder, difficulty controlling the flow of urine, or chronic kidney disease Hematological: Brusing easily Endocrine: High blood sugar requiring insulin (IDDM) Rheumatologic: No reported rheumatological signs and symptoms such as fatigue, joint pain,  tenderness, swelling, redness, heat, stiffness, decreased range of motion, with or without associated rash Musculoskeletal: Negative for myasthenia gravis, muscular dystrophy, multiple sclerosis or malignant hyperthermia Work History: Out of work due to pain  Allergies  Mr. Ericsson has No Known Allergies.  Laboratory Chemistry Profile   Screening Lab Results  Component Value Date   SARSCOV2NAA NOT DETECTED 10/02/2019   COVIDSOURCE NASOPHARYNGEAL 10/02/2019    Renal Lab Results  Component Value Date   BUN 14 07/29/2019   CREATININE 0.87 07/29/2019   BCR 16 07/29/2019  GFRAA 101 07/29/2019   GFRNONAA 87 07/29/2019                             Hepatic Lab Results  Component Value Date   AST 16 07/15/2019   ALT 20 07/15/2019   ALBUMIN 4.9 (H) 07/15/2019   ALKPHOS 64 07/15/2019                        Electrolytes Lab Results  Component Value Date   NA 141 07/29/2019   K 4.5 07/29/2019   CL 103 07/29/2019   CALCIUM 9.8 07/29/2019                        Neuropathy Lab Results  Component Value Date   HGBA1C 9.1 (A) 07/15/2019                        Coagulation Lab Results  Component Value Date   PLT 217 07/15/2019                        Cardiovascular Lab Results  Component Value Date   HGB 14.4 07/15/2019   HCT 44.1 07/15/2019                         ID Lab Results  Component Value Date   SARSCOV2NAA NOT DETECTED 10/02/2019    Endocrine Lab Results  Component Value Date   TSH 2.080 07/15/2019                        Note: Lab results reviewed.  [place]  Drug: Mr. Azimi  reports previous drug use. Alcohol:  reports no history of alcohol use. Tobacco:  reports that he has quit smoking. He has a 40.00 pack-year smoking history. He has never used smokeless tobacco. Medical:  has a past medical history of Arthritis, Diabetes ([place]) (2000), Diabetic neuropathy ([place]), GERD (gastroesophageal reflux disease), HTN (hypertension) (2010), Hypercholesterolemia,  and Vision problem. Family: family history includes ALS in his brother; Diabetes in his paternal grandfather; Heart attack in his father; Heart murmur in his father; Other in his mother.  Past Surgical History:  Procedure Laterality Date  . EYE SURGERY Left 1990s   " to peel off a growth"  . HAND SURGERY  1980s   R hand pointer finger   . NOSE SURGERY  1979   after broke his nose   Active Ambulatory Problems    Diagnosis Date Noted  . Hypercholesterolemia   . HTN (hypertension) 12/09/2008  . Diabetic neuropathy ([place])   . Diabetes ([place]) 12/09/1998  . Arthritis   . Chronic pain 02/26/2018  . Carpal tunnel syndrome of right wrist 06/04/2018  . Trigger finger of right thumb 06/04/2018  . Pain in joint, shoulder region 03/24/2019  . Dizziness 07/16/2019  . Weight loss 07/16/2019  . Lumbar facet arthropathy 10/07/2019  . Lumbar degenerative disc disease 10/07/2019  . Bilateral primary osteoarthritis of knee 10/07/2019  . Primary osteoarthritis of left shoulder 10/07/2019   Resolved Ambulatory Problems    Diagnosis Date Noted  . Skin sore 02/26/2018  . RMSF [facility] spotted fever) 06/09/2018  . Acute right-sided low back pain 07/31/2018   Past Medical History:  Diagnosis Date  . GERD (gastroesophageal reflux disease)   . Vision problem  Constitutional Exam  General appearance: Well nourished, well developed, and well hydrated. In no apparent acute distress Vitals:   10/07/19 0911  BP: (!) 182/81  Pulse: 61  Resp: 16  Temp: (!) 97.5 F (36.4 C)  TempSrc: Temporal  SpO2: 99%  Weight: 185 lb 12.8 oz (84.3 kg)  Height: 6' 2.5" (1.892 m)   BMI Assessment: Estimated body mass index is 23.54 kg/m as calculated from the following:   Height as of this encounter: 6' 2.5" (1.892 m).   Weight as of this encounter: 185 lb 12.8 oz (84.3 kg).  BMI interpretation table: BMI level Category Range association with higher incidence of chronic pain  <18 kg/m2 Underweight    18.5-24.9 kg/m2 Ideal body weight   25-29.9 kg/m2 Overweight Increased incidence by 20%  30-34.9 kg/m2 Obese (Class I) Increased incidence by 68%  35-39.9 kg/m2 Severe obesity (Class II) Increased incidence by 136%  >40 kg/m2 Extreme obesity (Class III) Increased incidence by 254%   Patient's current BMI Ideal Body weight  Body mass index is 23.54 kg/m. Ideal body weight: 83.4 kg (183 lb 12.1 oz) Adjusted ideal body weight: 83.7 kg (184 lb 9.1 oz)   BMI Readings from Last 4 Encounters:  10/07/19 23.54 kg/m  10/06/19 23.50 kg/m  09/22/19 23.55 kg/m  07/29/19 23.73 kg/m   Wt Readings from Last 4 Encounters:  10/07/19 185 lb 12.8 oz (84.3 kg)  10/06/19 183 lb (83 kg)  09/22/19 183 lb 6.4 oz (83.2 kg)  07/29/19 184 lb 12.8 oz (83.8 kg)  Psych/Mental status: Alert, oriented x 3 (person, place, & time)       Eyes: PERLA Respiratory: No evidence of acute respiratory distress  Cervical Spine Area Exam  Skin & Axial Inspection: No masses, redness, edema, swelling, or associated skin lesions Alignment: Symmetrical Functional ROM: Mechanically restricted ROM      Stability: No instability detected Muscle Tone/Strength: Functionally intact. No obvious neuro-muscular anomalies detected. Sensory (Neurological): Musculoskeletal pain pattern Palpation: No palpable anomalies              Upper Extremity (UE) Exam    Side: Right upper extremity  Side: Left upper extremity  Skin & Extremity Inspection: Skin color, temperature, and hair growth are WNL. No peripheral edema or cyanosis. No masses, redness, swelling, asymmetry, or associated skin lesions. No contractures.  Skin & Extremity Inspection: Skin color, temperature, and hair growth are WNL. No peripheral edema or cyanosis. No masses, redness, swelling, asymmetry, or associated skin lesions. No contractures.  Functional ROM: Unrestricted ROM          Functional ROM: Pain restricted ROM          Muscle Tone/Strength: Functionally  intact. No obvious neuro-muscular anomalies detected.  Muscle Tone/Strength: Functionally intact. No obvious neuro-muscular anomalies detected.  Sensory (Neurological): Unimpaired          Sensory (Neurological): Arthropathic arthralgia          Palpation: No palpable anomalies              Palpation: Complains of area being tender to palpation              Provocative Test(s):  Phalen's test: deferred Tinel's test: deferred Apley's scratch test (touch opposite shoulder):  Action 1 (Across chest): deferred Action 2 (Overhead): deferred Action 3 (LB reach): deferred   Provocative Test(s):  Phalen's test: deferred Tinel's test: deferred Apley's scratch test (touch opposite shoulder):  Action 1 (Across chest): Decreased ROM Action 2 (Overhead): Decreased ROM  Action 3 (LB reach): Decreased ROM    Thoracic Spine Area Exam  Skin & Axial Inspection: No masses, redness, or swelling Alignment: Symmetrical Functional ROM: Unrestricted ROM Stability: No instability detected Muscle Tone/Strength: Functionally intact. No obvious neuro-muscular anomalies detected. Sensory (Neurological): Unimpaired Muscle strength & Tone: No palpable anomalies  Lumbar Spine Area Exam  Skin & Axial Inspection: No masses, redness, or swelling Alignment: Symmetrical Functional ROM: Unrestricted ROM       Stability: No instability detected Muscle Tone/Strength: Functionally intact. No obvious neuro-muscular anomalies detected. Sensory (Neurological): Musculoskeletal pain pattern Palpation: No palpable anomalies       Provocative Tests: Hyperextension/rotation test: (+) bilaterally for facet joint pain. Lumbar quadrant test (Kemp's test): deferred today       Lateral bending test: deferred today       Patrick's Maneuver: deferred today                   FABER* test: deferred today                   S-I anterior distraction/compression test: deferred today         S-I lateral compression test: deferred today          S-I Thigh-thrust test: deferred today         S-I Gaenslen's test: deferred today         *(Flexion, ABduction and External Rotation)  Gait & Posture Assessment  Ambulation: Unassisted Gait: Relatively normal for age and body habitus Posture: WNL   Lower Extremity Exam    Side: Right lower extremity  Side: Left lower extremity  Stability: No instability observed          Stability: No instability observed          Skin & Extremity Inspection: Skin color, temperature, and hair growth are WNL. No peripheral edema or cyanosis. No masses, redness, swelling, asymmetry, or associated skin lesions. No contractures.  Skin & Extremity Inspection: Skin color, temperature, and hair growth are WNL. No peripheral edema or cyanosis. No masses, redness, swelling, asymmetry, or associated skin lesions. No contractures.  Functional ROM: Unrestricted ROM                  Functional ROM: Unrestricted ROM                  Muscle Tone/Strength: Functionally intact. No obvious neuro-muscular anomalies detected.  Muscle Tone/Strength: Functionally intact. No obvious neuro-muscular anomalies detected.  Sensory (Neurological): Arthropathic arthralgia        Sensory (Neurological): Arthropathic arthralgia        DTR: Patellar: 1+: trace Achilles: deferred today Plantar: deferred today  DTR: Patellar: 1+: trace Achilles: deferred today Plantar: deferred today  Palpation: No palpable anomalies  Palpation: No palpable anomalies   Assessment  Primary Diagnosis & Pertinent Problem List: The primary encounter diagnosis was Chronic pain syndrome. Diagnoses of Diabetic polyneuropathy associated with type 2 diabetes mellitus ([place]), Carpal tunnel syndrome of right wrist, Trigger finger of right thumb, Pain in joint of left shoulder, Lumbar facet arthropathy, Lumbar degenerative disc disease, Bilateral primary osteoarthritis of knee, and Primary osteoarthritis of left shoulder were also pertinent to this  visit.  Visit Diagnosis (New problems to examiner): 1. Chronic pain syndrome   2. Diabetic polyneuropathy associated with type 2 diabetes mellitus (HCC)   3. Carpal tunnel syndrome of right wrist   4. Trigger finger of right thumb   5. Pain in joint of left shoulder  6. Lumbar facet arthropathy   7. Lumbar degenerative disc disease   8. Bilateral primary osteoarthritis of knee   9. Primary osteoarthritis of left shoulder    Plan of Care (Initial workup plan)  Note: Mr. Staffa was reminded that as per protocol, today's visit has been an evaluation only. We have not taken over the patient's controlled substance management.    General Recommendations: The pain condition that the patient suffers from is best treated with a multidisciplinary approach that involves an increase in physical activity to prevent de-conditioning and worsening of the pain cycle, as well as psychological counseling (formal and/or informal) to address the co-morbid psychological affects of pain. Treatment will often involve judicious use of pain medications and interventional procedures to decrease the pain, allowing the patient to participate in the physical activity that will ultimately produce long-lasting pain reductions. The goal of the multidisciplinary approach is to return the patient to a higher level of overall function and to restore their ability to perform activities of daily living.  Patient is a very pleasant 68 year old male with history of type 2 diabetes, diabetic neuropathy, hypertension, left shoulder arthritis and diffuse musculoskeletal pain.  Patient works as a Curator.  He states that he has left shoulder pain that is worse with shoulder abduction.  He is currently on tramadol 50 mg twice a day which he states is not very effective for him.  He was previously on hydrocodone in the past.  He states that he would take 1 to 2 tablets a week when he had severe pain flare.  Has been compliant with  medications.  Patient is a type II diabetic on insulin.  Plan: -UDS today which is customary for new patients.  So long as this is appropriate, can consider patient for hydrocodone 5 mg as needed, quantity 30/month which the patient states will likely last for 2 months as he would only take 1 to 2 tablets a week when he has a severe pain flare. -Continue gabapentin 40 mg 3 times daily as prescribed -Okay to continue meloxicam 7.5 mg daily.  Patient counseled on long-term chronic NSAID use in the context of diabetes and risk factors for cardiovascular disease.  -Left shoulder arthropathy interventional options: Intra-articular left shoulder steroid injection, left suprascapular nerve block with possible pulsed radiofrequency ablation -Lumbar facet arthropathy interventional options: Diagnostic lumbar facet medial branch nerve blocks, possible radiofrequency ablation -Bilateral knee osteoarthritis interventional options: Intra-articular knee steroid injections, intra-articular knee Hyalgan, genicular nerve block with possible radiofrequency ablation..   Lab Orders     Compliance Drug Analysis, Ur  Pharmacotherapy (current): Medications ordered:  No orders of the defined types were placed in this encounter.  Medications administered during this visit: Mozell Hardacre. Bunte had no medications administered during this visit.   Pharmacological management options:  Opioid Analgesics: The patient was informed that there is no guarantee that he would be a candidate for opioid analgesics. The decision will be made following CDC guidelines. This decision will be based on the results of diagnostic studies, as well as Mr. Althaus's risk profile.   Membrane stabilizer: Adequate regimen  Muscle relaxant: To be determined at a later time  NSAID: Adequate regimen  Other analgesic(s): To be determined at a later time   Interventional management options: Mr. Grahn was informed that there is no guarantee that he  would be a candidate for interventional therapies. The decision will be based on the results of diagnostic studies, as well as Mr. Riviera's risk profile.  Procedure(s) under consideration:  -Left shoulder arthropathy interventional options: Intra-articular left shoulder steroid injection, left suprascapular nerve block with possible pulsed radiofrequency ablation -Lumbar facet arthropathy interventional options: Diagnostic lumbar facet medial branch nerve blocks, possible radiofrequency ablation -Bilateral knee osteoarthritis interventional options: Intra-articular knee steroid injections, intra-articular knee Hyalgan, genicular nerve block with possible radiofrequency ablation..    Provider-requested follow-up: Return in about 12 days (around 10/19/2019) for Medication Management, virtual.  Future Appointments  Date Time Provider [place]  10/19/2019  2:45 PM Gillis Santa, MD [facility] None    Primary Care Physician: [name], MD Location: [facility] Note by: Gillis Santa, MD Date: 10/07/2019; Time: 10:23 AM  Note: This dictation was prepared with Dragon dictation. Any transcriptional errors that may result from this process are unintentional.

## 2019-10-07 NOTE — Progress Notes (Signed)
Safety precautions to be maintained throughout the outpatient stay will include: orient to surroundings, keep bed in low position, maintain call bell within reach at all times, provide assistance with transfer out of bed and ambulation.  

## 2019-10-08 ENCOUNTER — Telehealth: Payer: Self-pay | Admitting: *Deleted

## 2019-10-08 ENCOUNTER — Telehealth: Payer: Self-pay

## 2019-10-08 NOTE — Telephone Encounter (Signed)
Attempted to reach patient for post-procedure f/u call. No answer. Left message for him to please not hesitate to call us if he has any questions/concerns regarding his care. 

## 2019-10-08 NOTE — Telephone Encounter (Signed)
  Follow up Call-  Call back number 10/06/2019  Post procedure Call Back phone  # [phone]  Permission to leave phone message Yes  Some recent data might be hidden    No answer, no machine to leave a message.

## 2019-10-11 LAB — COMPLIANCE DRUG ANALYSIS, UR

## 2019-10-13 ENCOUNTER — Encounter: Payer: Self-pay | Admitting: Gastroenterology

## 2019-10-18 ENCOUNTER — Telehealth: Payer: Self-pay

## 2019-10-18 ENCOUNTER — Encounter: Payer: Self-pay | Admitting: Student in an Organized Health Care Education/Training Program

## 2019-10-18 NOTE — Telephone Encounter (Signed)
Attempted to call patient for pre virtual appointment visit.  Left message to call us back.

## 2019-10-19 ENCOUNTER — Ambulatory Visit
Payer: PPO | Attending: Student in an Organized Health Care Education/Training Program | Admitting: Student in an Organized Health Care Education/Training Program

## 2019-10-19 ENCOUNTER — Encounter: Payer: Self-pay | Admitting: Student in an Organized Health Care Education/Training Program

## 2019-10-19 ENCOUNTER — Other Ambulatory Visit: Payer: Self-pay

## 2019-10-19 DIAGNOSIS — G5601 Carpal tunnel syndrome, right upper limb: Secondary | ICD-10-CM | POA: Diagnosis not present

## 2019-10-19 DIAGNOSIS — G894 Chronic pain syndrome: Secondary | ICD-10-CM

## 2019-10-19 DIAGNOSIS — M19012 Primary osteoarthritis, left shoulder: Secondary | ICD-10-CM

## 2019-10-19 DIAGNOSIS — M65311 Trigger thumb, right thumb: Secondary | ICD-10-CM

## 2019-10-19 DIAGNOSIS — M25512 Pain in left shoulder: Secondary | ICD-10-CM

## 2019-10-19 DIAGNOSIS — M47816 Spondylosis without myelopathy or radiculopathy, lumbar region: Secondary | ICD-10-CM

## 2019-10-19 DIAGNOSIS — M170 Bilateral primary osteoarthritis of knee: Secondary | ICD-10-CM | POA: Diagnosis not present

## 2019-10-19 DIAGNOSIS — E1142 Type 2 diabetes mellitus with diabetic polyneuropathy: Secondary | ICD-10-CM

## 2019-10-19 DIAGNOSIS — Z0289 Encounter for other administrative examinations: Secondary | ICD-10-CM

## 2019-10-19 DIAGNOSIS — M5136 Other intervertebral disc degeneration, lumbar region: Secondary | ICD-10-CM

## 2019-10-19 MED ORDER — HYDROCODONE-ACETAMINOPHEN 5-325 MG PO TABS: 1.0000 | ORAL_TABLET | Freq: Every day | ORAL | 0 refills | Status: DC | PRN

## 2019-10-19 NOTE — Progress Notes (Signed)
Patient's Name: Gregory Harrison  MRN: [id number]  Referring Provider: [name], MD  DOB: 09-Feb-1951  PCP: [name], MD  DOS: 10/19/2019  Note by: Gillis Santa, MD  Service setting: Virtual Visit (Telephone)  Attending: Gillis Santa, MD  Location: Telephone Encounter  Specialty: Interventional Pain Management  Patient type: Established   Pain Management Virtual Encounter Note - Virtual Visit via Zuehl (real-time audio visits between healthcare provider and patient).   Patient's Phone No.:  [phone] (home); [phone] (mobile); (Preferred) [phone] No e-mail address on record  [place], [place] - [address] [place] [address] Phone: [phone] Fax: [phone]  [id number] [place] [place] [address] [address]Phone: [phone]: [phone]   Pre-screening note:  Our staff contacted Mr. CPerrowand offered him an "in person", "face-to-face" appointment versus a telephone encounter. He indicated preferring the telephone encounter, at this time.   Primary Reason(s) for Virtual Visit: Encounter for evaluation before starting new chronic pain management plan of care (Level of risk: moderate) COVID-19*  Social distancing based on CDC ans AMA recommendations.    I contacted Gregory Harrison 10/19/2019 via video conference.      I clearly identified myself as BGillis Santa MD. I verified that I was speaking with the correct person using two identifiers (Name: Gregory Harrison and date of birth: 68August 28, 1952.  Advanced Informed Consent I sought verbal advanced consent from Gregory Brilliantfor virtual visit interactions. I informed Mr. CMachucaof possible security and privacy concerns, risks, and limitations associated with providing "not-in-person" medical evaluation and management services. I also informed Mr. CCarmeanof the availability  of "in-person" appointments. Finally, I informed him that there would be a charge for the virtual visit and that he could be  personally, fully or partially, financially responsible for it. Mr. CLeitzexpressed understanding and agreed to proceed.   Historic Elements   Mr. OCLIFFARD HAIRis a 68y.o. year old, male patient evaluated today after his last encounter by our practice on 10/18/2019. Gregory Harrison has a past medical history of Arthritis, Diabetes ([place] (2000), Diabetic neuropathy ([place], GERD (gastroesophageal reflux disease), HTN (hypertension) (2010), Hypercholesterolemia, and Vision problem. He also  has a past surgical history that includes Hand surgery (1980s); Nose surgery (1979); and Eye surgery (Left, 1990s). Mr. CFlemisterhas a current medication list which includes the following prescription(s): alcohol swabs, atorvastatin, onetouch verio, diclofenac sodium, jardiance, gabapentin, glucose blood, hydrochlorothiazide, hydrocodone-acetaminophen, basaglar kwikpen, insulin glargine, one touch delica lancing dev, losartan, meloxicam, metformin, miralax, onetouch delica lancets fine, and tramadol. He  reports that he has quit smoking. He has a 40.00 pack-year smoking history. He has never used smokeless tobacco. He reports previous drug use. He reports that he does not drink alcohol. Mr. CCreanhas No Known Allergies.   HPI  He is being evaluated for review of studies ordered on initial visit and to consider treatment plan options.   Patient is a very pleasant 68year old male with history of type 2 diabetes, diabetic neuropathy, hypertension, left shoulder arthritis and diffuse musculoskeletal pain.  Patient works as a pCurator  He states that he has left shoulder pain that is worse with shoulder abduction.  He is currently on tramadol 50 mg twice a day which he states is not very effective for him.  He was previously on hydrocodone in the past.  He states that  he would take 1 to 2 tablets a week when  he had severe pain flare.  Has been compliant with medications.  Patient is a type II diabetic on insulin.  Patient has completed urine toxicology screen.  Low creatinine.  Low suspicion for any aberrant behavior but will have patient repeat UDS.  We will take over hydrocodone 5 mg quantity 30 which usually lasts patient anywhere from 60 to 90 days.  Controlled Substance Pharmacotherapy Assessment REMS (Risk Evaluation and Mitigation Strategy)  Analgesic: Hydrocodone 5 mg daily as needed, quantity 30 to last 60 to 90 days Monitoring: [place] PMP: PDMP reviewed during this encounter.       Not applicable at this point since we have not taken over the patient's medication management yet. List of other Serum/Urine Drug Screening Test(s):  No results found for: AMPHSCRSER, BARBSCRSER, BENZOSCRSER, COCAINSCRSER, COCAINSCRNUR, PCPSCRSER, THCSCRSER, THCU, CANNABQUANT, [place], [place], [place], [place] List of all UDS test(s) done:  Lab Results  Component Value Date   SUMMARY Note 10/07/2019   Last UDS on record: Summary  Date Value Ref Range Status  10/07/2019 Note  Final    Comment:    ==================================================================== Compliance Drug Analysis, Ur ==================================================================== Specimen Alert Note: Urinary creatinine is low; ability to detect some drugs may be compromised. Interpret results with caution. (Creatinine) ==================================================================== Test                             Result       Flag       Units Drug Present and Declared for Prescription Verification   Gabapentin                     PRESENT      EXPECTED Drug Absent but Declared for Prescription Verification   Tramadol                       Not Detected UNEXPECTED ng/mg creat   Diclofenac                     Not Detected UNEXPECTED    Diclofenac, as indicated in the declared medication list, is not    always  detected even when used as directed. ==================================================================== Test                      Result    Flag   Units      Ref Range   Creatinine              19        LL     mg/dL      >=20 ==================================================================== Declared Medications:  The flagging and interpretation on this report are based on the  following declared medications.  Unexpected results may arise from  inaccuracies in the declared medications.  **Note: The testing scope of this panel includes these medications:  Gabapentin  Tramadol  **Note: The testing scope of this panel does not include small to  moderate amounts of these reported medications:  Diclofenac  **Note: The testing scope of this panel does not include the  following reported medications:  Atorvastatin  Empagliflozin  Hydrochlorothiazide  Insulin  Losartan  Meloxicam  Metformin  Polyethylene Glycol  Topical ==================================================================== For clinical consultation, please call [phone]. ====================================================================    UDS interpretation: No unexpected findings.         Will repeat given low  creatinine Medication Assessment Form: Patient will sign form next week when he comes for repeat UDS Treatment compliance: Treatment may start today if patient agrees with proposed plan. Evaluation of compliance is not applicable at this point Risk Assessment Profile: Aberrant behavior: See initial evaluations. None observed or detected today Comorbid factors increasing risk of overdose: See initial evaluation. No additional risks detected today Opioid risk tool (ORT):  Opioid Risk  10/07/2019  Alcohol 0  Illegal Drugs 0  Rx Drugs 0  Alcohol 0  Illegal Drugs 0  Rx Drugs 0  Age between 16-45 years  0  History of Preadolescent Sexual Abuse 0  Psychological Disease 0  Depression 0  Opioid Risk  Tool Scoring 0  Opioid Risk Interpretation Low Risk    ORT Scoring interpretation table:  Score <3 = Low Risk for SUD  Score between 4-7 = Moderate Risk for SUD  Score >8 = High Risk for Opioid Abuse   Risk of substance use disorder (SUD): Low  Risk Mitigation Strategies:  Patient opioid safety counseling: Covered. Patient-Prescriber Agreement (PPA): Patient will sign next week  Controlled substance notification to other providers: Written and sent today.  Pharmacologic Plan: Today we may be taking over the patient's pharmacological regimen. See below.             Meds   Current Outpatient Medications:  .  Alcohol Swabs PADS, Use with insulin, Disp: 100 each, Rfl: 3 .  atorvastatin (LIPITOR) 40 MG tablet, Take 1 tablet (40 mg total) by mouth daily., Disp: 90 tablet, Rfl: 0 .  Blood Glucose Monitoring Suppl (ONETOUCH VERIO) w/Device KIT, 1 each by Does not apply route as directed., Disp: 1 kit, Rfl: 0 .  diclofenac sodium (VOLTAREN) 1 % GEL, Apply 2 g topically 4 (four) times daily., Disp: 100 g, Rfl: 0 .  empagliflozin (JARDIANCE) 25 MG TABS tablet, Take 25 mg by mouth daily., Disp: 30 tablet, Rfl:  .  gabapentin (NEURONTIN) 400 MG capsule, TAKE 1 CAPSULE (400 MG TOTAL) BY MOUTH 3 (THREE) TIMES DAILY., Disp: 270 capsule, Rfl: 2 .  glucose blood (ONETOUCH VERIO) test strip, Use as instructed, Disp: 100 each, Rfl: 12 .  hydrochlorothiazide (HYDRODIURIL) 12.5 MG tablet, Take 1 tablet (12.5 mg total) by mouth daily., Disp: 90 tablet, Rfl: 3 .  HYDROcodone-acetaminophen (NORCO/VICODIN) 5-325 MG tablet, Take 1 tablet by mouth daily as needed for severe pain., Disp: 30 tablet, Rfl: 0 .  Insulin Glargine (BASAGLAR KWIKPEN) 100 UNIT/ML SOPN, Inject 0.4 mLs (40 Units total) into the skin daily., Disp:  , Rfl:  .  Insulin Glargine (LANTUS) 100 UNIT/ML Solostar Pen, Inject 100 Units into the skin every morning., Disp: 2 pen, Rfl: 0 .  Lancet Devices (ONE TOUCH DELICA LANCING DEV) MISC, 1 each by  Does not apply route as directed., Disp: 1 each, Rfl: 0 .  losartan (COZAAR) 25 MG tablet, Take 1 tablet (25 mg total) by mouth daily., Disp: 90 tablet, Rfl: 3 .  meloxicam (MOBIC) 7.5 MG tablet, Take 7.5 mg by mouth daily. Taking for 15 days, Disp: , Rfl:  .  metFORMIN (GLUCOPHAGE XR) 500 MG 24 hr tablet, Take 2 tablets (1,000 mg total) by mouth 2 (two) times daily., Disp: 360 tablet, Rfl: 3 .  MIRALAX 17 GM/SCOOP powder, Please specify directions, refills and quantity, Disp: 255 g, Rfl: 0 .  ONETOUCH DELICA LANCETS FINE MISC, 1 each by Does not apply route 3 (three) times daily., Disp: 100 each, Rfl: 3 .  traMADol (ULTRAM)  50 MG tablet, Take by mouth 2 (two) times daily., Disp: , Rfl:   Laboratory Chemistry Profile   Screening Lab Results  Component Value Date   SARSCOV2NAA NOT DETECTED 10/02/2019   COVIDSOURCE NASOPHARYNGEAL 10/02/2019    Inflammation (CRP: Acute Phase) (ESR: Chronic Phase) No results found for: CRP, ESRSEDRATE, LATICACIDVEN                       Rheumatology No results found for: RF, ANA, LABURIC, URICUR, LYMEIGGIGMAB, LYMEABIGMQN, HLAB27                      Renal Lab Results  Component Value Date   BUN 14 07/29/2019   CREATININE 0.87 07/29/2019   BCR 16 07/29/2019   GFRAA 101 07/29/2019   GFRNONAA 87 07/29/2019                             Hepatic Lab Results  Component Value Date   AST 16 07/15/2019   ALT 20 07/15/2019   ALBUMIN 4.9 (H) 07/15/2019   ALKPHOS 64 07/15/2019                        Electrolytes Lab Results  Component Value Date   NA 141 07/29/2019   K 4.5 07/29/2019   CL 103 07/29/2019   CALCIUM 9.8 07/29/2019                        Neuropathy Lab Results  Component Value Date   HGBA1C 9.1 (A) 07/15/2019                         Coagulation Lab Results  Component Value Date   PLT 217 07/15/2019                        Cardiovascular Lab Results  Component Value Date   HGB 14.4 07/15/2019   HCT 44.1 07/15/2019                          ID Lab Results  Component Value Date   SARSCOV2NAA NOT DETECTED 10/02/2019    Cancer No results found for: CEA, CA125, LABCA2                      Endocrine Lab Results  Component Value Date   TSH 2.080 07/15/2019                        Note: Lab results reviewed.  Recent Diagnostic Imaging Review   Results for orders placed during the hospital encounter of 03/24/18  DG Shoulder Left   Narrative CLINICAL DATA:  Remote fall.  Chronic left shoulder pain  EXAM: LEFT SHOULDER - 2+ VIEW  COMPARISON:  None.  FINDINGS: Moderate to advanced osteoarthritic changes in the left glenohumeral and AC joints with joint space narrowing and spurring. No acute bony abnormality. Specifically, no fracture, subluxation, or dislocation.  IMPRESSION: Moderate to advanced degenerative changes in the left shoulder. No acute bony abnormality.   Electronically Signed   By: Rolm Baptise M.D.   On: 03/24/2018 09:53    Results for orders placed during the hospital encounter of 07/31/18  DG Lumbar Spine Complete   Narrative CLINICAL DATA:  Acute  onset of right-sided low back pain radiating to the buttock.  EXAM: LUMBAR SPINE - COMPLETE 4+ VIEW  COMPARISON:  None.  FINDINGS: Five lumbar type vertebral bodies show normal alignment. Disc space heights are normal at L3-4 and above. Mild disc space narrowing at L4-5. Moderate disc space narrowing at L5-S1. No evidence of fracture or focal lesion. There is ordinary lower lumbar facet osteoarthritis.  IMPRESSION: Degenerative disc disease more pronounced at L5-S1 than L4-5. Ordinary lower lumbar facet osteoarthritis.   Electronically Signed   By: Nelson Chimes M.D.   On: 07/31/2018 14:07    Results for orders placed during the hospital encounter of 03/15/15  DG Knee 1-2 Views Right   Narrative CLINICAL DATA:  BILATERAL knee pain or LEFT greater than RIGHT, RIGHT knee pain is chronic while LEFT knee suffered an  injury 2 days ago, displaced patella which was self reduced  EXAM: RIGHT KNEE - 1-2 VIEW; LEFT KNEE - 1-2 VIEW  COMPARISON:  None  FINDINGS: LEFT knee two views:  Osseous demineralization.  Joint space narrowing with minimal spur formation.  No acute fracture, dislocation or bone destruction.  Patellar spurs at patellar tendon and quadriceps tendon insertions.  No knee joint effusion.  RIGHT knee two views:  Osseous demineralization.  Minimal joint space narrowing.  No acute fracture, dislocation or bone destruction.  Small patellar spur at quadriceps tendon insertion.  No knee joint effusion.  IMPRESSION: Osseous demineralization with degenerative changes of both knees slightly greater on LEFT.  No acute abnormalities.   Electronically Signed   By: Lavonia Dana M.D.   On: 03/15/2015 16:05    Knee-L DG 1-2 views:  Results for orders placed during the hospital encounter of 03/15/15  DG Knee 1-2 Views Left   Narrative CLINICAL DATA:  BILATERAL knee pain or LEFT greater than RIGHT, RIGHT knee pain is chronic while LEFT knee suffered an injury 2 days ago, displaced patella which was self reduced  EXAM: RIGHT KNEE - 1-2 VIEW; LEFT KNEE - 1-2 VIEW  COMPARISON:  None  FINDINGS: LEFT knee two views:  Osseous demineralization.  Joint space narrowing with minimal spur formation.  No acute fracture, dislocation or bone destruction.  Patellar spurs at patellar tendon and quadriceps tendon insertions.  No knee joint effusion.  RIGHT knee two views:  Osseous demineralization.  Minimal joint space narrowing.  No acute fracture, dislocation or bone destruction.  Small patellar spur at quadriceps tendon insertion.  No knee joint effusion.  IMPRESSION: Osseous demineralization with degenerative changes of both knees slightly greater on LEFT.  No acute abnormalities.   Electronically Signed   By: Lavonia Dana M.D.   On: 03/15/2015 16:05      Complexity Note: Imaging results reviewed. Results shared with Mr. Shreffler, using Layman's terms.                         Assessment  The primary encounter diagnosis was Chronic pain syndrome. Diagnoses of Diabetic polyneuropathy associated with type 2 diabetes mellitus ([place]), Carpal tunnel syndrome of right wrist, Trigger finger of right thumb, Pain in joint of left shoulder, Lumbar facet arthropathy, Lumbar degenerative disc disease, Bilateral primary osteoarthritis of knee, Primary osteoarthritis of left shoulder, and Pain management contract signed were also pertinent to this visit.  Plan of Care  I am having Maya D. [place] start on HYDROcodone-acetaminophen. I am also having him maintain his OneTouch Verio, glucose blood, ONE TOUCH DELICA LANCING DEV, OneTouch Delica Lancets  Fine, Alcohol Swabs, atorvastatin, metFORMIN, losartan, Basaglar KwikPen, hydrochlorothiazide, Jardiance, diclofenac sodium, Insulin Glargine, meloxicam, traMADol, MiraLax, and gabapentin.  Patient is a very pleasant 68 year old male with history of type 2 diabetes, diabetic neuropathy, hypertension, left shoulder arthritis and diffuse musculoskeletal pain.  Patient works as a Curator.  He states that he has left shoulder pain that is worse with shoulder abduction.  He is currently on tramadol 50 mg twice a day which he states is not very effective for him.  He was previously on hydrocodone in the past.  He states that he would take 1 to 2 tablets a week when he had severe pain flare.  Has been compliant with medications.  Patient is a type II diabetic on insulin  Patient has completed urine toxicology screen.  Although he had a low creatinine, we will take over the patient's hydrocodone as below.  We will have patient repeat urine toxicology screen.  Low suspicion for any aberrant behavior.  Plan: -Repeat UDS given low urine creatinine (patient will come next week) -Patient to come next week to sign pain  contract -Prescription for hydrocodone as below, should last 2 to 3 months -Continue gabapentin 400 mg 3 times a day, meloxicam 7.5 mg daily.Patient counseled on long-term chronic NSAID use in the context of diabetes and risk factors for cardiovascular disease.   Pharmacotherapy (Medications Ordered): Meds ordered this encounter  Medications  . HYDROcodone-acetaminophen (NORCO/VICODIN) 5-325 MG tablet    Sig: Take 1 tablet by mouth daily as needed for severe pain.    Dispense:  30 tablet    Refill:  0    Chronic Pain. (STOP Act - Not applicable). Fill one day early if closed on scheduled refill date.   Interventional management options:  Considering:   -Left shoulder arthropathy interventional options: Intra-articular left shoulder steroid injection, left suprascapular nerve block with possible pulsed radiofrequency ablation -Lumbar facet arthropathy interventional options: Diagnostic lumbar facet medial branch nerve blocks, possible radiofrequency ablation -Bilateral knee osteoarthritis interventional options: Intra-articular knee steroid injections, intra-articular knee Hyalgan, genicular nerve block with possible radiofrequency ablation..   Total duration of non-face-to-face encounter: 69mnutes.  Follow-up plan:   Return in about 10 weeks (around 12/28/2019) for Medication Management, virtual.    Recent Visits Date Type Provider Dept  10/07/19 Office Visit LGillis Santa MD [facility]  Showing recent visits within past 90 days and meeting all other requirements   Today's Visits Date Type Provider Dept  10/19/19 Office Visit LGillis Santa MD [facility]  Showing today's visits and meeting all other requirements   Future Appointments No visits were found meeting these conditions.  Showing future appointments within next 90 days and meeting all other requirements   Primary Care Physician: WCarollee Leitz MD Location: Telephone Virtual Visit Note by:  BGillis Santa MD Date: 10/19/2019; Time: 2:55 PM  Note: This dictation was prepared with Dragon dictation. Any transcriptional errors that may result from this process are unintentional.  Disclaimer:  * Given the special circumstances of the COVID-19 pandemic, the federal government has announced that the Office for Civil Rights (OCR) will exercise its enforcement discretion and will not impose penalties on physicians using telehealth in the event of noncompliance with regulatory requirements under the [place](HIPAA) in connection with the good faith provision of telehealth during the CHENID-78national public health emergency. (AGisela

## 2019-10-25 DIAGNOSIS — G894 Chronic pain syndrome: Secondary | ICD-10-CM | POA: Diagnosis not present

## 2019-10-28 LAB — COMPLIANCE DRUG ANALYSIS, UR

## 2019-11-01 DIAGNOSIS — E113511 Type 2 diabetes mellitus with proliferative diabetic retinopathy with macular edema, right eye: Secondary | ICD-10-CM | POA: Diagnosis not present

## 2019-11-23 ENCOUNTER — Encounter: Payer: Self-pay | Admitting: Family Medicine

## 2019-11-23 ENCOUNTER — Ambulatory Visit (INDEPENDENT_AMBULATORY_CARE_PROVIDER_SITE_OTHER): Payer: PPO | Admitting: Family Medicine

## 2019-11-23 ENCOUNTER — Other Ambulatory Visit: Payer: Self-pay

## 2019-11-23 VITALS — BP 160/82 | HR 69 | Wt 189.2 lb

## 2019-11-23 DIAGNOSIS — E1142 Type 2 diabetes mellitus with diabetic polyneuropathy: Secondary | ICD-10-CM

## 2019-11-23 DIAGNOSIS — E119 Type 2 diabetes mellitus without complications: Secondary | ICD-10-CM

## 2019-11-23 DIAGNOSIS — E118 Type 2 diabetes mellitus with unspecified complications: Secondary | ICD-10-CM

## 2019-11-23 LAB — POCT GLYCOSYLATED HEMOGLOBIN (HGB A1C): HbA1c, POC (controlled diabetic range): 9.4 % — AB (ref 0.0–7.0)

## 2019-11-23 MED ORDER — JARDIANCE 25 MG PO TABS: 25.0000 mg | ORAL_TABLET | Freq: Every day | ORAL | Status: DC

## 2019-11-23 MED ORDER — ONETOUCH VERIO VI STRP: ORAL_STRIP | 12 refills | Status: DC

## 2019-11-23 NOTE — Progress Notes (Signed)
  Patient Name: Gregory Harrison Date of Birth: 10-17-1951 Date of Visit: 11/25/19 PCP: [name], MD  Chief Complaint:   Subjective: Gregory Harrison is a pleasant 68 y.o. with medical history significant for DM 2, hypertension, hypercholesterolemia, diabetic neuropathy chronic pain presenting today for medication refill .   DM 2  Has been out of Jardiance for 8 days.  Brought in forms to be completed to help with cost of Jardiance.  Blood sugars been in 200s lately.  Patient reports compliancy with insulin however diet has been appropriate lately due to holidays  ROS: Per HPI.   I have reviewed the patient's medical, surgical, family, and social history as appropriate.  Vitals:   11/23/19 0854  BP: (!) 160/82  Pulse: 69  SpO2: 98%    General: Alert and oriented, no apparent distress  Cardiovascular: RRR with no murmurs noted Respiratory: CTA bilaterally     Diabetes (HCC) HbA1c 9.4 today.  Given that patient has not been taking Jardiance and has been off diet will restart Jardiance and continue Lantus 25 units daily.  Follow-up in 3 months for repeat HbA1c, diabetic foot exam      Return to care in 45months.   [name], MD  Family Medicine Teaching Service

## 2019-11-23 NOTE — Patient Instructions (Addendum)
It was a pleasure seeing you again today Gregory Harrison.  I have reordered your Jardiance and glucose strips.    Please follow-up with pain management clinic for your concerns regarding her shoulder and possible injections.  The results of your colonoscopy are not back yet.  When I receive the pathology reports I will contact you if the results are abnormal and we can make an appointment to discuss the results.   Follow-up with me in 3 months for diabetic check and foot exam  Happy holidays [name]

## 2019-11-25 NOTE — Assessment & Plan Note (Signed)
HbA1c 9.4 today.  Given that patient has not been taking Jardiance and has been off diet will restart Jardiance and continue Lantus 25 units daily.  Follow-up in 3 months for repeat HbA1c, diabetic foot exam

## 2019-12-14 ENCOUNTER — Other Ambulatory Visit: Payer: Self-pay | Admitting: Family Medicine

## 2019-12-14 DIAGNOSIS — E7800 Pure hypercholesterolemia, unspecified: Secondary | ICD-10-CM

## 2019-12-16 ENCOUNTER — Other Ambulatory Visit: Payer: Self-pay

## 2019-12-16 ENCOUNTER — Ambulatory Visit: Payer: PPO | Admitting: Pharmacist

## 2019-12-16 DIAGNOSIS — E119 Type 2 diabetes mellitus without complications: Secondary | ICD-10-CM

## 2019-12-16 NOTE — Progress Notes (Signed)
  Chronic Care Management   Outreach Note  12/16/2019 Name: Gregory Harrison MRN: [id number] DOB: September 13, 1951  Referred by: [name], MD Reason for referral : Chronic Care Management   An unsuccessful telephone outreach was attempted today. The patient was referred to the case management team by for assistance with care management and care coordination.   Follow Up Plan: A HIPPA compliant phone message was left for the patient providing contact information and requesting a return call.  The care management team will reach out to the patient again over the next 7-10 days.   SIGNATURE  Regina Eck, PharmD, BCPS Clinical Pharmacist, [place]: [phone]

## 2019-12-21 ENCOUNTER — Other Ambulatory Visit: Payer: Self-pay

## 2019-12-21 DIAGNOSIS — E1142 Type 2 diabetes mellitus with diabetic polyneuropathy: Secondary | ICD-10-CM

## 2019-12-21 NOTE — Telephone Encounter (Signed)
Pt calls nurse line and states that he was never able to receive rx for jardiance. Per pharmacy, they never received the rx.  Please resend the rx.   Order class on rx for 11/23/19 says "no print", instead of "normal" so that may be the issue.   Please advise  To PCP  Talbot Grumbling, RN

## 2019-12-21 NOTE — Telephone Encounter (Signed)
Patient calls back and clarifies issues with receiving jardiance. Pt states that he uses [facility] patient assistance and this is where he needs the rx faxed to.  I see a note where Almyra Free has reached out to patient, will forward to her for next steps.  Spoke with [employer] and discovered that original fax had provider name cut off. Will need to send in new fax to [phone].   Please advise  Talbot Grumbling, RN

## 2019-12-27 ENCOUNTER — Telehealth: Payer: Self-pay

## 2019-12-27 ENCOUNTER — Encounter: Payer: Self-pay | Admitting: Student in an Organized Health Care Education/Training Program

## 2019-12-27 NOTE — Telephone Encounter (Signed)
LM for patient to call us for pre virtual appointment questions.    

## 2019-12-28 ENCOUNTER — Other Ambulatory Visit: Payer: Self-pay

## 2019-12-28 ENCOUNTER — Encounter: Payer: Self-pay | Admitting: Student in an Organized Health Care Education/Training Program

## 2019-12-28 ENCOUNTER — Ambulatory Visit
Payer: PPO | Attending: Student in an Organized Health Care Education/Training Program | Admitting: Student in an Organized Health Care Education/Training Program

## 2019-12-28 DIAGNOSIS — G894 Chronic pain syndrome: Secondary | ICD-10-CM

## 2019-12-28 DIAGNOSIS — M47816 Spondylosis without myelopathy or radiculopathy, lumbar region: Secondary | ICD-10-CM | POA: Diagnosis not present

## 2019-12-28 DIAGNOSIS — M5136 Other intervertebral disc degeneration, lumbar region: Secondary | ICD-10-CM

## 2019-12-28 DIAGNOSIS — E1142 Type 2 diabetes mellitus with diabetic polyneuropathy: Secondary | ICD-10-CM

## 2019-12-28 DIAGNOSIS — M65311 Trigger thumb, right thumb: Secondary | ICD-10-CM

## 2019-12-28 DIAGNOSIS — M25512 Pain in left shoulder: Secondary | ICD-10-CM

## 2019-12-28 DIAGNOSIS — G5601 Carpal tunnel syndrome, right upper limb: Secondary | ICD-10-CM | POA: Diagnosis not present

## 2019-12-28 MED ORDER — HYDROCODONE-ACETAMINOPHEN 5-325 MG PO TABS: 1.0000 | ORAL_TABLET | Freq: Every day | ORAL | 0 refills | Status: DC | PRN

## 2019-12-28 NOTE — Progress Notes (Signed)
Patient: Gregory Harrison  Service Category: E/M  Provider: Gillis Santa, MD  DOB: 20-Dec-1950  DOS: 12/28/2019  Location: Office  MRN: [id number]  Setting: Ambulatory outpatient  Referring Provider: [name], MD  Type: Established Patient  Specialty: Interventional Pain Management  PCP: [name], MD  Location: Home  Delivery: TeleHealth     Virtual Encounter - Pain Management PROVIDER NOTE: Information contained herein reflects review and annotations entered in association with encounter. Interpretation of such information and data should be left to medically-trained personnel. Information provided to patient can be located elsewhere in the medical record under "Patient Instructions". Document created using STT-dictation technology, any transcriptional errors that may result from process are unintentional.    Contact & Pharmacy Preferred: [phone] Home: [phone] (home) Mobile: [phone] (mobile) E-mail: No e-mail address on record  [place] [place] [address] Phone: [phone] Fax: [phone]  [id number] [place], [address] [place] [address] Phone: [phone] Fax: [phone]    Reason COVID-19*  Social distancing based on CDC and AMA recommendations.   I contacted [name] on 12/28/2019 via telephone.      I clearly identified myself as Gillis Santa, MD. I verified that I was speaking with the correct person using two identifiers (Name: Gregory Harrison, and date of birth: 25-May-1951).  This visit was completed via telephone due to the restrictions of the COVID-19 pandemic. All issues as above were discussed and addressed but no physical exam was performed. If it was felt that the patient should be evaluated in the office, they were directed there. The patient verbally consented to this visit. Patient was unable to complete an  audio/visual visit due to Technical difficulties and/or Lack of internet. Due to the catastrophic nature of the COVID-19 pandemic, this visit was done through audio contact only.  Location of the patient: home address (see Epic for details)  Location of the provider: office  Consent I sought verbal advanced consent from [name] for virtual visit interactions. I informed [name] of possible security and privacy concerns, risks, and limitations associated with providing "not-in-person" medical evaluation and management services. I also informed [name] of the availability of "in-person" appointments. Finally, I informed him that there would be a charge for the virtual visit and that he could be  personally, fully or partially, financially responsible for it. Mr. Exume expressed understanding and agreed to proceed.   Historic Elements   [name] is a 69 y.o. year old, male patient evaluated today after his last encounter by our practice on 12/27/2019. [name]  has a past medical history of Arthritis, Diabetes ([place]) (2000), Diabetic neuropathy ([place]), GERD (gastroesophageal reflux disease), HTN (hypertension) (2010), Hypercholesterolemia, and Vision problem. He also  has a past surgical history that includes Hand surgery (1980s); Nose surgery (1979); and Eye surgery (Left, 1990s). [name] has a current medication list which includes the following prescription(s): alcohol swabs, atorvastatin, onetouch verio, diclofenac sodium, jardiance, gabapentin, onetouch verio, hydrochlorothiazide, hydrocodone-acetaminophen, basaglar kwikpen, insulin glargine, one touch delica lancing dev, losartan, meloxicam, metformin, miralax, and onetouch delica lancets fine. He  reports that he has quit smoking. He has a 40.00 pack-year smoking history. He has never used smokeless tobacco. He reports previous drug use. He reports that he does not drink alcohol. [name] has No Known Allergies.   HPI   Today, he is  being contacted for medication management.   No change in medical history since last visit.  Patient's pain is at baseline.  Patient continues multimodal pain regimen as prescribed.  States that it provides pain relief and improvement in functional status.  Takes hydrocodone rarely, PRN for severe pain related to diabetic neuropathy. #30 last 8 weeks.  Pharmacotherapy Assessment  Analgesic: 10/19/2019  2   10/19/2019  Hydrocodone-Acetamin 5-325 MG  30.00  30 Bi Lat   [id number]   Nor (2372)   0  5.00 MME  Medicare   [place]   Monitoring: Pharmacotherapy: No side-effects or adverse reactions reported. [place] PMP: PDMP reviewed during this encounter.       Compliance: No problems identified. Effectiveness: Clinically acceptable. Plan: Refer to "POC".  UDS:  Summary  Date Value Ref Range Status  10/25/2019 Note  Final    Comment:    ==================================================================== Compliance Drug Analysis, Ur ==================================================================== Test                             Result       Flag       Units Drug Present and Declared for Prescription Verification   Gabapentin                     PRESENT      EXPECTED   Acetaminophen                  PRESENT      EXPECTED Drug Absent but Declared for Prescription Verification   Hydrocodone                    Not Detected UNEXPECTED ng/mg creat   Tramadol                       Not Detected UNEXPECTED ng/mg creat   Diclofenac                     Not Detected UNEXPECTED    Diclofenac, as indicated in the declared medication list, is not    always detected even when used as directed. ==================================================================== Test                      Result    Flag   Units      Ref Range   Creatinine              33               mg/dL      >=20 ==================================================================== Declared Medications:  The flagging and  interpretation on this report are based on the  following declared medications.  Unexpected results may arise from  inaccuracies in the declared medications.  **Note: The testing scope of this panel includes these medications:  Gabapentin (Neurontin)  Hydrocodone (Norco)  Tramadol  **Note: The testing scope of this panel does not include small to  moderate amounts of these reported medications:  Acetaminophen (Norco)  Diclofenac (Voltaren)  **Note: The testing scope of this panel does not include the  following reported medications:  Atorvastatin (Lipitor)  Hydrochlorothiazide (Hydrodiuril)  Losartan (Cozaar)  Meloxicam (Mobic)  Metformin  Polyethylene Glycol  Topical ==================================================================== For clinical consultation, please call [phone]. ====================================================================    Laboratory Chemistry Profile (12 mo)  Renal: 07/29/2019: BUN 14; BUN/Creatinine Ratio 16; Creatinine, Ser 0.87  Lab Results  Component  Value Date   GFRAA 101 07/29/2019   GFRNONAA 87 07/29/2019   Hepatic: 07/15/2019: Albumin 4.9 Lab Results  Component Value Date   AST 16 07/15/2019   ALT 20 07/15/2019   Other: No results found for requested labs within last 8760 hours. Note: Above Lab results reviewed.  Imaging  DG Lumbar Spine Complete CLINICAL DATA:  Acute onset of right-sided low back pain radiating to the buttock.  EXAM: LUMBAR SPINE - COMPLETE 4+ VIEW  COMPARISON:  None.  FINDINGS: Five lumbar type vertebral bodies show normal alignment. Disc space heights are normal at L3-4 and above. Mild disc space narrowing at L4-5. Moderate disc space narrowing at L5-S1. No evidence of fracture or focal lesion. There is ordinary lower lumbar facet osteoarthritis.  IMPRESSION: Degenerative disc disease more pronounced at L5-S1 than L4-5. Ordinary lower lumbar facet osteoarthritis.  Electronically Signed   By:  Nelson Chimes M.D.   On: 07/31/2018 14:07 DG Pelvis 1-2 Views CLINICAL DATA:  Acute onset of right-sided low back pain radiating to the buttock.  EXAM: PELVIS - 1-2 VIEW  COMPARISON:  None.  FINDINGS: Bones of the pelvis appear normal. No traumatic finding. There is osteoarthritis of both hips with mild joint space narrowing. Right sacroiliac joint appears normal. Mild osteoarthritis of the left sacroiliac joint.  IMPRESSION: Mild osteoarthritis of both hip joints. Mild osteoarthritis of the left sacroiliac joint.  Electronically Signed   By: Nelson Chimes M.D.   On: 07/31/2018 14:06   Assessment  The primary encounter diagnosis was Chronic pain syndrome. Diagnoses of Diabetic polyneuropathy associated with type 2 diabetes mellitus ([place]), Carpal tunnel syndrome of right wrist, Trigger finger of right thumb, Pain in joint of left shoulder, Lumbar facet arthropathy, and Lumbar degenerative disc disease were also pertinent to this visit.  Plan of Care  I have discontinued Thoams D. Frappier's traMADol. I am also having him start on HYDROcodone-acetaminophen. Additionally, I am having him maintain his OneTouch Verio, ONE TOUCH DELICA LANCING DEV, OneTouch Delica Lancets Fine, Alcohol Swabs, metFORMIN, losartan, Basaglar KwikPen, hydrochlorothiazide, diclofenac sodium, Insulin Glargine, meloxicam, MiraLax, gabapentin, OneTouch Verio, Jardiance, and atorvastatin.  Pharmacotherapy (Medications Ordered): Meds ordered this encounter  Medications  . HYDROcodone-acetaminophen (NORCO/VICODIN) 5-325 MG tablet    Sig: Take 1 tablet by mouth daily as needed for severe pain.    Dispense:  30 tablet    Refill:  0    Chronic Pain. (STOP Act - Not applicable). Fill one day early if closed on scheduled refill date.   Encourage patient to continue to manage his diabetes as this will also help his neuropathic pain.  Continue gabapentin 400 mg 3 times daily.  We will refill hydrocodone as  above.   Follow-up plan:   Return in about 8 weeks (around 02/22/2020) for Medication Management, virtual.    Recent Visits Date Type Provider Dept  10/19/19 Office Visit Gillis Santa, MD [facility]  10/07/19 Office Visit Gillis Santa, MD [facility]  Showing recent visits within past 90 days and meeting all other requirements   Today's Visits Date Type Provider Dept  12/28/19 Office Visit Gillis Santa, MD [facility]  Showing today's visits and meeting all other requirements   Future Appointments No visits were found meeting these conditions.  Showing future appointments within next 90 days and meeting all other requirements   I discussed the assessment and treatment plan with the patient. The patient was provided an opportunity to ask questions and all were answered. The  patient agreed with the plan and demonstrated an understanding of the instructions.  Patient advised to call back or seek an in-person evaluation if the symptoms or condition worsens.  Duration of encounter: 30 minutes.  Note by: Gillis Santa, MD Date: 12/28/2019; Time: 2:34 PM

## 2020-01-02 ENCOUNTER — Ambulatory Visit: Payer: PPO | Attending: Internal Medicine

## 2020-01-02 DIAGNOSIS — Z23 Encounter for immunization: Secondary | ICD-10-CM

## 2020-01-02 NOTE — Progress Notes (Signed)
   Covid-19 Vaccination Clinic  Name:  Gregory Harrison    MRN: [id number] DOB: Apr 30, 1951  01/02/2020  [name] was observed post Covid-19 immunization for 15 minutes without incidence. He was provided with Vaccine Information Sheet and instruction to access the V-Safe system.   [name] was instructed to call 911 with any severe reactions post vaccine: Marland Kitchen Difficulty breathing  . Swelling of your face and throat  . A fast heartbeat  . A bad rash all over your body  . Dizziness and weakness    Immunizations Administered    Name Date Dose VIS Date Route   Pfizer COVID-19 Vaccine 01/02/2020 11:59 AM 0.3 mL 11/19/2019 Intramuscular   Manufacturer: [place]   Lot: [id number]   [place]: [id number]

## 2020-01-03 ENCOUNTER — Telehealth: Payer: Self-pay | Admitting: Family Medicine

## 2020-01-03 NOTE — Telephone Encounter (Signed)
Patient came in today hasn't gotten refills that he requested.  Needs refills for Jardiance 25 mg. Also refill on Basaglar Kwik Pen 100 units/ml. 3 ml single patient use pen.  [facility] [address]  [phone]

## 2020-01-04 ENCOUNTER — Other Ambulatory Visit: Payer: Self-pay | Admitting: Family Medicine

## 2020-01-04 ENCOUNTER — Ambulatory Visit: Payer: Self-pay | Admitting: Pharmacist

## 2020-01-04 ENCOUNTER — Other Ambulatory Visit: Payer: Self-pay | Admitting: Pharmacy Technician

## 2020-01-04 ENCOUNTER — Telehealth: Payer: Self-pay | Admitting: Family Medicine

## 2020-01-04 DIAGNOSIS — E119 Type 2 diabetes mellitus without complications: Secondary | ICD-10-CM

## 2020-01-04 MED ORDER — JARDIANCE 25 MG PO TABS: 25.0000 mg | ORAL_TABLET | Freq: Every day | ORAL | Status: DC

## 2020-01-04 NOTE — Telephone Encounter (Signed)
Spoke with patient regarding refill medications.  He has 2 Engineer, agricultural pens that he received from mail pharmacy.  He has been out of [place] since Nov 2020.  I had completed an assistance form for medication at his lst appointment and he was to complete his portion and send in the application for financial assistance.  He doesn't remember if he completed it.  I also called [employer] and they state that Mr Riches has not received any insulin from them since August 2020 and they do not cover Jardiance.  [facility] also [facility] and they also report that they have not refilled any Jardiance or Basaglar.  I did however refill Jardiance today and [facility]. I have also reached out to Dr Valentina Lucks for assistance and refer to [facility] to also help with medication management.  Patient aware and I ell see himin clinic at next appointment 02/04  [name] MD

## 2020-01-04 NOTE — Progress Notes (Signed)
   Chronic Care Management   Outreach Note  01/04/2020 Name: Gregory Harrison MRN: [id number] DOB: 01-Apr-1951  Referred by: [name], MD Reason for referral : Chronic Care Management (Diabetes)  -Patient is currently on [place] 25 units qAM, metformin -FBG 325,365, 299 -Holidays have been challenging for patient's eating habits -Due to cost, he he not been taking Jardiance since November 2020 -Last A1c 9.4% (uses one verio glucometer) -Increase basaglar to 30u, patient verbalizes understanding, discussed with PCP   Follow Up Plan: Face to Face appointment with care management team member scheduled for:  01/13/20--with Drexel Iha, PharmD  SIGNATURE Regina Eck, PharmD, BCPS Clinical Pharmacist, [place]: [phone]

## 2020-01-04 NOTE — Telephone Encounter (Signed)
Pt calls nurse line checking status of refill request from 12/21/19.   To PCP  Talbot Grumbling, RN

## 2020-01-04 NOTE — Patient Outreach (Signed)
[place] [facility]) Care Management  01/04/2020  [place] 10-10-1951 [id number]                                       Medication Assistance Referral  Referral From: [facility] Embedded RPh Jenne Pane   Medication/Company: Lorenso Quarry / Bi Patient application portion: Faxed  Provider application portion: Faxed  to Dr. [name] Provider address/fax verified via: Office website  Medication/Company: [name] / Ralph Leyden Patient application portion: Faxed  Provider application portion: Faxed  to Dr. [name] Provider address/fax verified via: Office website  Faxed the entire application for both companies to embedded [facility] RPh Lottie Dawson as patient has an appointment next week and she will have him and the provider sign the forms and fax back   Follow up:  Once applications are faxed back will submit application.  Nathaly Dawkins P. Velvia Mehrer, [place] Management [phone]

## 2020-01-04 NOTE — Telephone Encounter (Signed)
Completed thank you

## 2020-01-05 NOTE — Telephone Encounter (Signed)
LVM for pt to call office back to inform him that his Rx request has been completed.Alexandrya Chim Zimmerman Rumple, CMA

## 2020-01-07 ENCOUNTER — Ambulatory Visit (INDEPENDENT_AMBULATORY_CARE_PROVIDER_SITE_OTHER): Payer: PPO

## 2020-01-07 ENCOUNTER — Other Ambulatory Visit: Payer: Self-pay

## 2020-01-07 VITALS — Ht 75.0 in | Wt 187.0 lb

## 2020-01-07 DIAGNOSIS — Z0000 Encounter for general adult medical examination without abnormal findings: Secondary | ICD-10-CM | POA: Diagnosis not present

## 2020-01-07 NOTE — Progress Notes (Addendum)
Subjective:   Gregory Harrison is a 69 y.o. male who presents for Medicare Annual/Subsequent preventive examination.  The patient consented to a virtual visit.   Review of Systems: Defer to PCP  Cardiac Risk Factors include: advanced age (>37mn, >>79women);diabetes mellitus;male gender  Objective:    Vitals: Ht 6' 3" (1.905 m)   Wt 187 lb (84.8 kg)   BMI 23.37 kg/m   Body mass index is 23.37 kg/m.  Advanced Directives 01/07/2020 11/23/2019 10/07/2019 07/15/2019 02/17/2019 10/01/2018 07/31/2018  Does Patient Have a Medical Advance Directive? _0  No No  Would patient like information on creating a medical advance directive? Yes (MAU/Ambulatory/Procedural Areas - Information given) No - Patient declined No - Patient declined No - Patient declined No - Patient declined No - Patient declined No - Patient declined   Tobacco Social History   Tobacco Use  Smoking Status Former Smoker  . Packs/day: 2.00  . Years: 20.00  . Pack years: 40.00  Smokeless Tobacco Never Used  Tobacco Comment   1989     Counseling given: still not smoking again Comment: Quit in 102-06-89 Clinical Intake:  Pre-visit preparation completed: Yes  How often do you need to have someone help you when you read instructions, pamphlets, or other written materials from your doctor or pharmacy?: 2 - Rarely What is the last grade level you completed in school?: high school  Past Medical History:  Diagnosis Date  . Arthritis    202-05-2009 . Diabetes ([place] 22000-02-06 . Diabetic neuropathy ([place]   . GERD (gastroesophageal reflux disease)   . HTN (hypertension) 2Feb 06, 2010 . Hypercholesterolemia   . 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 69 blurry vision   Past Surgical History:  Procedure Laterality Date  . EYE SURGERY Left 1990s   " to peel off a growth"  . HAND SURGERY  1980s   R hand pointer finger   . NOSE SURGERY  1979   after broke his nose   Family History  Problem Relation Age of Onset  . Heart  attack Father        died in 102-06-83 had open surgery  . Heart murmur Father   . Other Mother        bladder taken out for unknown reason. unspecified brain tumor  . ALS Brother   . Diabetes Paternal Grandfather   . Colon cancer Neg Hx   . Colon polyps Neg Hx   . Esophageal cancer Neg Hx   . Rectal cancer Neg Hx   . Stomach cancer Neg Hx    Social History   Socioeconomic History  . Marital status: Married    Spouse name: LHermelinda Dellen  . Number of children: 2  . Years of education: 159 . Highest education level: High school graduate  Occupational History  . Occupation: retired  Tobacco Use  . Smoking status: Former Smoker    Packs/day: 2.00    Years: 20.00    Pack years: 40.00  . Smokeless tobacco: Never Used  . Tobacco comment: 1989  Substance and Sexual Activity  . Alcohol use: No    Comment: quit in 11995-02-06 . Drug use: Not Currently    Comment: marijuana pills for pain, going to quit.   .Marland KitchenSexual activity: Yes    Partners: Female  Other Topics Concern  . Not on file  Social History Narrative   Patient lives with his wife, daughter and her  2 children.    Patient enjoys hunting and fishing and being outside.    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    Patient has 2 dogs and 1 cat.    Social Determinants of Health   Financial Resource Strain: Low Risk   . Difficulty of Paying Living Expenses: Not very hard  Food Insecurity: No Food Insecurity  . Worried About Charity fundraiser in the Last Year: Never true  . Ran Out of Food in the Last Year: Never true  Transportation Needs: No Transportation Needs  . Lack of Transportation (Medical): No  . Lack of Transportation (Non-Medical): No  Physical Activity: Sufficiently Active  . Days of Exercise per Week: 7 days  . Minutes of Exercise per Session: 60 min  Stress: No Stress Concern Present  . Feeling of Stress : Not at all  Social Connections: Somewhat Isolated  .  Frequency of Communication with Friends and Family: More than three times a week  . Frequency of Social Gatherings with Friends and Family: More than three times a week  . Attends Religious Services: Never  . Active Member of Clubs or Organizations: No  . Attends Archivist Meetings: Never  . Marital Status: Married   Outpatient Encounter Medications as of 01/07/2020  Medication Sig  . Alcohol Swabs PADS Use with insulin  . atorvastatin (LIPITOR) 40 MG tablet TAKE 1 TABLET BY MOUTH EVERY DAY  . Blood Glucose Monitoring Suppl (ONETOUCH VERIO) w/Device KIT 1 each by Does not apply route as directed.  . diclofenac sodium (VOLTAREN) 1 % GEL Apply 2 g topically 4 (four) times daily.  . empagliflozin (JARDIANCE) 25 MG TABS tablet Take 25 mg by mouth daily.  Marland Kitchen gabapentin (NEURONTIN) 400 MG capsule TAKE 1 CAPSULE (400 MG TOTAL) BY MOUTH 3 (THREE) TIMES DAILY.  Marland Kitchen glucose blood (ONETOUCH VERIO) test strip Use as instructed  . hydrochlorothiazide (HYDRODIURIL) 12.5 MG tablet Take 1 tablet (12.5 mg total) by mouth daily.  Marland Kitchen HYDROcodone-acetaminophen (NORCO/VICODIN) 5-325 MG tablet Take 1 tablet by mouth daily as needed for severe pain.  . Insulin Glargine (BASAGLAR KWIKPEN) 100 UNIT/ML SOPN Inject 0.4 mLs (40 Units total) into the skin daily. (Patient taking differently: Inject 25 Units into the skin daily. )  . Lancet Devices (ONE TOUCH DELICA LANCING DEV) MISC 1 each by Does not apply route as directed.  Marland Kitchen losartan (COZAAR) 25 MG tablet Take 1 tablet (25 mg total) by mouth daily.  . meloxicam (MOBIC) 7.5 MG tablet Take 7.5 mg by mouth daily. Taking for 15 days  . metFORMIN (GLUCOPHAGE XR) 500 MG 24 hr tablet Take 2 tablets (1,000 mg total) by mouth 2 (two) times daily.  Glory Rosebush DELICA LANCETS FINE MISC 1 each by Does not apply route 3 (three) times daily.  Marland Kitchen MIRALAX 17 GM/SCOOP powder Please specify directions, refills and quantity (Patient not taking: Reported on 01/07/2020)   No  facility-administered encounter medications on file as of 01/07/2020.   Activities of Daily Living In your present state of health, do you have any difficulty performing the following activities: 01/07/2020  Hearing? N  Vision? N  Difficulty concentrating or making decisions? N  Walking or climbing stairs? N  Dressing or bathing? N  Doing errands, shopping? N  Preparing Food and eating ? N  Using the Toilet? N  In the past six months, have you accidently leaked urine? N  Do you have problems with loss  of bowel control? N  Managing your Medications? N  Managing your Finances? N  Housekeeping or managing your Housekeeping? N  Some recent data might be hidden   Patient Care Team: [name], MD as PCP - General Pruitt, Royce Macadamia, [facility] (Pharmacist) Simcox, Luiz Ochoa, CPhT as [facility] (Pharmacy Technician)   Assessment:   This is a routine wellness examination for [facility].  Exercise Activities and Dietary recommendations Current Exercise Habits: The patient has a physically strenuous job, but has no regular exercise apart from work., Exercise limited by: orthopedic condition(s)  Goals    . DIET - INCREASE WATER INTAKE     Patient drinks 3 diet pepsi per day.     Marland Kitchen HEMOGLOBIN A1C < 7     Last A1C 11/2019 9.4      Fall Risk Fall Risk  01/07/2020 11/23/2019 10/07/2019 07/29/2019 07/15/2019  Falls in the past year? 0 0 0 0 0  Number falls in past yr: - 0 - 0 -  Follow up - - - Falls evaluation completed -   Is the patient's home free of loose throw rugs in walkways, pet beds, electrical cords, etc?  yes      Grab bars in the bathroom? no      Handrails on the stairs?   no      Adequate lighting?   yes  Patient rating of health (0-10): 5  Depression Screen PHQ 2/9 Scores 01/07/2020 11/23/2019 09/05/2019 07/29/2019  PHQ - 2 Score 0 0 0 0  PHQ- 9 Score - - 3 -   Cognitive Function  Immunization History  Administered Date(s) Administered  .  Influenza,inj,Quad PF,6+ Mos 10/01/2018  . Influenza,inj,quad, With Preservative 09/09/2017  . Influenza-Unspecified 08/10/2019  . PFIZER SARS-COV-2 Vaccination 01/02/2020  . Pneumococcal Conjugate-13 01/11/2019   Screening Tests Health Maintenance  Topic Date Due  . TETANUS/TDAP  06/08/1968  . FOOT EXAM  03/10/2019  . PNA vac Low Risk Adult (2 of 2 - PPSV23) 01/12/2020  . HEMOGLOBIN A1C  05/23/2020  . OPHTHALMOLOGY EXAM  08/22/2020  . COLONOSCOPY  10/05/2020  . INFLUENZA VACCINE  Completed  . Hepatitis C Screening  Completed   Cancer Screenings: Lung: Low Dose CT Chest recommended if Age 75-80 years, 30 pack-year currently smoking OR have quit w/in 15years. Patient does not qualify. Colorectal: UTD  Additional Screenings:  Hepatitis C Screening: Completed  Flu Vaccine: Completed  PNA Vaccine: Completed   Plan:  Fill out an advance directive packet.  I am glad you received the 1st dose of the Covid Vaccine, follow-up with your 2nd! We will see you next week for your PCP apt on February 4th. For every diet pepsi you drink, drink a bottle of water.   I have personally reviewed and noted the following in the patient's chart:   . Medical and social history . Use of alcohol, tobacco or illicit drugs  . Current medications and supplements . Functional ability and status . Nutritional status . Physical activity . Advanced directives . List of other physicians . Hospitalizations, surgeries, and ER visits in previous 12 months . Vitals . Screenings to include cognitive, depression, and falls . Referrals and appointments  In addition, I have reviewed and discussed with patient certain preventive protocols, quality metrics, and best practice recommendations. A written personalized care plan for preventive services as well as general preventive health recommendations were provided to patient.  This visit was conducted virtually in the setting of the [place] pandemic.  Dorna Bloom, CMA  01/07/2020  I have reviewed this visit and agree with the documnetation [name] MD

## 2020-01-07 NOTE — Patient Instructions (Addendum)
You spoke to Gregory Harrison, Gregory Harrison over the phone for your annual wellness visit.  We discussed goals: Goals    . DIET - INCREASE WATER INTAKE     Patient drinks 3 diet pepsi per day.     Marland Kitchen HEMOGLOBIN A1C < 7     Last A1C 11/2019 9.4      We also discussed recommended health maintenance. Please call our office and schedule a visit. As discussed, you are due pretty much up to date with everything! At your next diabetes visit around March, you can complete a foot exam. If you need a tetanus shot, we can send a script to your local pharmacy.   Health Maintenance  Topic Date Due  . TETANUS/TDAP  06/08/1968  . FOOT EXAM  03/10/2019  . PNA vac Low Risk Adult (2 of 2 - PPSV23) 01/12/2020  . HEMOGLOBIN A1C  05/23/2020  . OPHTHALMOLOGY EXAM  08/22/2020  . COLONOSCOPY  10/05/2020  . INFLUENZA VACCINE  Completed  . Hepatitis C Screening  Completed   Fill out an advance directive packet.  I am glad you received the 1st dose of the Covid Vaccine, follow-up with your 2nd! We will see you next week for your PCP apt on February 4th. For every diet pepsi you drink, drink a bottle of water.  Health Maintenance, Male Adopting a healthy lifestyle and getting preventive care are important in promoting health and wellness. Ask your health care provider about:  The right schedule for you to have regular tests and exams.  Things you can do on your own to prevent diseases and keep yourself healthy. What should I know about diet, weight, and exercise? Eat a healthy diet   Eat a diet that includes plenty of vegetables, fruits, low-fat dairy products, and lean protein.  Do not eat a lot of foods that are high in solid fats, added sugars, or sodium. Maintain a healthy weight Body mass index (BMI) is a measurement that can be used to identify possible weight problems. It estimates body fat based on height and weight. Your health care provider can help determine your BMI and help you achieve or maintain a  healthy weight. Get regular exercise Get regular exercise. This is one of the most important things you can do for your health. Most adults should:  Exercise for at least 150 minutes each week. The exercise should increase your heart rate and make you sweat (moderate-intensity exercise).  Do strengthening exercises at least twice a week. This is in addition to the moderate-intensity exercise.  Spend less time sitting. Even light physical activity can be beneficial. Watch cholesterol and blood lipids Have your blood tested for lipids and cholesterol at 71 years of age, then have this test every 5 years. You may need to have your cholesterol levels checked more often if:  Your lipid or cholesterol levels are high.  You are older than 71 years of age.  You are at high risk for heart disease. What should I know about cancer screening? Many types of cancers can be detected early and may often be prevented. Depending on your health history and family history, you may need to have cancer screening at various ages. This may include screening for:  Colorectal cancer.  Prostate cancer.  Skin cancer.  Lung cancer. What should I know about heart disease, diabetes, and high blood pressure? Blood pressure and heart disease  High blood pressure causes heart disease and increases the risk of stroke. This is more  likely to develop in people who have high blood pressure readings, are of African descent, or are overweight.  Talk with your health care provider about your target blood pressure readings.  Have your blood pressure checked: ? Every 3-5 years if you are 66-32 years of age. ? Every year if you are 75 years old or older.  If you are between the ages of 33 and 34 and are a current or former smoker, ask your health care provider if you should have a one-time screening for abdominal aortic aneurysm (AAA). Diabetes Have regular diabetes screenings. This checks your fasting blood sugar  level. Have the screening done:  Once every three years after age 64 if you are at a normal weight and have a low risk for diabetes.  More often and at a younger age if you are overweight or have a high risk for diabetes. What should I know about preventing infection? Hepatitis B If you have a higher risk for hepatitis B, you should be screened for this virus. Talk with your health care provider to find out if you are at risk for hepatitis B infection. Hepatitis C Blood testing is recommended for:  Everyone born from [birth year] through 1967.  Anyone with known risk factors for hepatitis C. Sexually transmitted infections (STIs)  You should be screened each year for STIs, including gonorrhea and chlamydia, if: ? You are sexually active and are younger than 71 years of age. ? You are older than 71 years of age and your health care provider tells you that you are at risk for this type of infection. ? Your sexual activity has changed since you were last screened, and you are at increased risk for chlamydia or gonorrhea. Ask your health care provider if you are at risk.  Ask your health care provider about whether you are at high risk for HIV. Your health care provider may recommend a prescription medicine to help prevent HIV infection. If you choose to take medicine to prevent HIV, you should first get tested for HIV. You should then be tested every 3 months for as long as you are taking the medicine. Follow these instructions at home: Lifestyle  Do not use any products that contain nicotine or tobacco, such as cigarettes, e-cigarettes, and chewing tobacco. If you need help quitting, ask your health care provider.  Do not use street drugs.  Do not share needles.  Ask your health care provider for help if you need support or information about quitting drugs. Alcohol use  Do not drink alcohol if your health care provider tells you not to drink.  If you drink alcohol: ? Limit how much you  have to 0-2 drinks a day. ? Be aware of how much alcohol is in your drink. In the [place] one drink equals one 12 oz bottle of beer (355 mL), one 5 oz glass of wine (148 mL), or one 1 oz glass of hard liquor (44 mL). General instructions  Schedule regular health, dental, and eye exams.  Stay current with your vaccines.  Tell your health care provider if: ? You often feel depressed. ? You have ever been abused or do not feel safe at home. Summary  Adopting a healthy lifestyle and getting preventive care are important in promoting health and wellness.  Follow your health care provider's instructions about healthy diet, exercising, and getting tested or screened for diseases.  Follow your health care provider's instructions on monitoring your cholesterol and blood pressure. This information is  not intended to replace advice given to you by your health care provider. Make sure you discuss any questions you have with your health care provider. Document Revised: 11/18/2018 Document Reviewed: 11/18/2018 Elsevier Patient Education  2020 [place] [facility]'s number is [phone]. Please call with questions or concerns about what we discussed today.

## 2020-01-11 ENCOUNTER — Other Ambulatory Visit: Payer: Self-pay | Admitting: Family Medicine

## 2020-01-11 DIAGNOSIS — E7800 Pure hypercholesterolemia, unspecified: Secondary | ICD-10-CM

## 2020-01-13 ENCOUNTER — Other Ambulatory Visit: Payer: Self-pay

## 2020-01-13 ENCOUNTER — Encounter: Payer: Self-pay | Admitting: Family Medicine

## 2020-01-13 ENCOUNTER — Ambulatory Visit (INDEPENDENT_AMBULATORY_CARE_PROVIDER_SITE_OTHER): Payer: PPO | Admitting: Family Medicine

## 2020-01-13 VITALS — BP 150/78 | HR 72 | Wt 191.0 lb

## 2020-01-13 DIAGNOSIS — E1142 Type 2 diabetes mellitus with diabetic polyneuropathy: Secondary | ICD-10-CM | POA: Diagnosis not present

## 2020-01-13 DIAGNOSIS — M65351 Trigger finger, right little finger: Secondary | ICD-10-CM

## 2020-01-13 DIAGNOSIS — M65311 Trigger thumb, right thumb: Secondary | ICD-10-CM | POA: Diagnosis not present

## 2020-01-13 MED ORDER — MELOXICAM 7.5 MG PO TABS: ORAL_TABLET | ORAL | 0 refills | Status: DC

## 2020-01-13 NOTE — Progress Notes (Signed)
  Patient Name: Gregory Harrison Date of Birth: 04-Feb-1951 Date of Visit: 01/15/20 PCP: [name], MD  Chief Complaint: Little finger keeps catching  Subjective: Gregory Harrison is a pleasant 69 y.o. with medical history significant for type 2 diabetes, chronic pain, hypertension presenting today for ongoing issue with little finger.   Trigger finger Patient reports his left little finger has clicking sensation that is been ongoing for about 6 months.  He reports no decrease in sensation, no pain but is bothersome and annoying,especially when trying to work. He reports that every now and then he feels like his finger catching and he has to straighten it with his other hand.  Type 2 diabetes Blood sugars have been ranging in the 300s.  Patient has not been taking Jardiance and he ran out of this medication 3 months ago.  He is compliant with his insulin which was increased to 30 units 2 weeks ago.    ROS: Per HPI.   I have reviewed the patient's medical, surgical, family, and social history as appropriate.  Vitals:   01/13/20 1553  BP: (!) 150/78  Pulse: 72  SpO2: 98%   Left fifth digit shows visible clicking periodically repetitive movement.  Sensation normal and pulses present.  Trigger finger, right little finger Patient has clicking of the left little finger on repetitive movement most consistent with trigger finger. -In collaboration with patient was decided to splint fingers with Band-Aid for a week or 2.  It does not resolve can consider steroid injection. -Follow-up as needed  Diabetes ([place]) Unsure why patient continues to run out of of medication.  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  I think patient is not understanding that he needs to apply for financial assistance yearly to be able to receive help with medications. -Thank you to pharmacy for having to speak with myself and patient to be  a further understanding of what needs to happen to be able to continue financial assistance. -Patient understands that he needs to provide information to [place], pharmacy, to start the process of applying for financial assistance -He was given Engineer, agricultural and Jardiance samples from the clinic. -Follow-up in 1-2 month  [name], MD  [facility] Medicine Teaching Service

## 2020-01-13 NOTE — Patient Instructions (Addendum)
It was a pleasure seeing you today.  For your finger splint with bandaids for one to weeks that will help rest the tendon.  If it continues to worsen we can use a steroid injection that may help relieve.  I have sent in a prescription to the pharmacy for Meloxicam.  Take one tablet once a day for 15 days  Use your wrist splints at night to help with relief of your wrist pain.  You will ned to complete forms every year for financial assistance to be able to help receive your Januvia and Basaglar  Follow up with me as needed  Have a great day!!!  Gregory Leitz MD  Trigger Finger  Trigger finger, also called stenosing tenosynovitis,  is a condition that causes a finger to get stuck in a bent position. Each finger has a tendon, which is a tough, cord-like tissue that connects muscle to bone, and each tendon passes through a tunnel of tissue called a tendon sheath. To move your finger, your tendon needs to glide freely through the sheath. Trigger finger happens when the tendon or the sheath thickens, making it difficult to move your finger. Trigger finger can affect any finger or a thumb. It may affect more than one finger. Mild cases may clear up with rest and medicine. Severe cases require more treatment. What are the causes? Trigger finger is caused by a thickened finger tendon or tendon sheath. The cause of this thickening is not known. What increases the risk? The following factors may make you more likely to develop this condition:  Doing activities that require a strong grip.  Having rheumatoid arthritis, gout, or diabetes.  Being 45-81 years old.  Being male. What are the signs or symptoms? Symptoms of this condition include:  Pain when bending or straightening your finger.  Tenderness or swelling where your finger attaches to the palm of your hand.  A lump in the palm of your hand or on the inside of your finger.  Hearing a noise like a pop or a snap when you try to  straighten your finger.  Feeling a catching or locking sensation when you try to straighten your finger.  Being unable to straighten your finger. How is this diagnosed? This condition is diagnosed based on your symptoms and a physical exam. How is this treated? This condition may be treated by:  Resting your finger and avoiding activities that make symptoms worse.  Wearing a finger splint to keep your finger extended.  Taking NSAIDs, such as ibuprofen, to relieve pain and swelling.  Doing gentle exercises to stretch the finger as told by your health care provider.  Having medicine that reduces swelling and inflammation (steroids) injected into the tendon sheath. Injections may need to be repeated.  Having surgery to open the tendon sheath. This may be done if other treatments do not work and you cannot straighten your finger. You may need physical therapy after surgery. Follow these instructions at home: If you have a splint:  Wear the splint as told by your health care provider. Remove it only as told by your health care provider.  Loosen it if your fingers tingle, become numb, or turn cold and blue.  Keep it clean.  If the splint is not waterproof: ? Do not let it get wet. ? Cover it with a watertight covering when you take a bath or shower. Managing pain, stiffness, and swelling     If directed, apply heat to the affected area as often  as told by your health care provider. Use the heat source that your health care provider recommends, such as a moist heat pack or a heating pad.  Place a towel between your skin and the heat source.  Leave the heat on for 20-30 minutes.  Remove the heat if your skin turns bright red. This is especially important if you are unable to feel pain, heat, or cold. You may have a greater risk of getting burned. If directed, put ice on the painful area. To do this:  If you have a removable splint, remove it as told by your health care  provider.  Put ice in a plastic bag.  Place a towel between your skin and the bag or between your splint and the bag.  Leave the ice on for 20 minutes, 2-3 times a day.  Activity  Rest your finger as told by your health care provider. Avoid activities that make the pain worse.  Return to your normal activities as told by your health care provider. Ask your health care provider what activities are safe for you.  Do exercises as told by your health care provider.  Ask your health care provider when it is safe to drive if you have a splint on your hand. General instructions  Take over-the-counter and prescription medicines only as told by your health care provider.  Keep all follow-up visits as told by your health care provider. This is important. Contact a health care provider if:  Your symptoms are not improving with home care. Summary  Trigger finger, also called stenosing tenosynovitis, causes your finger to get stuck in a bent position. This can make it difficult and painful to straighten your finger.  This condition develops when a finger tendon or tendon sheath thickens.  Treatment may include resting your finger, wearing a splint, and taking medicines.  In severe cases, surgery to open the tendon sheath may be needed. This information is not intended to replace advice given to you by your health care provider. Make sure you discuss any questions you have with your health care provider. Document Revised: 04/12/2019 Document Reviewed: 04/12/2019 Elsevier Patient Education  [place].

## 2020-01-14 NOTE — Progress Notes (Signed)
Provided patient with 30-day samples of Tresiba (Lot: [id number]; Expiration Date: 10/2021) and Jardiance (LotMV:[id number]; Expiration Date: 09/2021).  Patient has ran out of insulin and SGLT2 inhibitor due to how he hasn't re-applied for patient assistance programs for 2021 year. Multiple unsuccessful attempts have been to contact patient to provide [place] team with income documents required for patient assistance program applications. Explained process to patient (will need new income documents each year and for him to provide wet signature on application each year in order to successfully apply). Patient verbalized understanding.  Made copy of income documents at appt (patient brought his social security documentation). On his social security documentation, there was a detailed description stating how patient may qualify for [place]. Instructed patient to contact Medicare to apply for [place]. If he is approved, [place] will assist with costs of all medications. Patient verbalized understanding. If [place] is denied, complete patient assistance application will be faxed. Provided patient with [facility] pharmacist Almyra Free [place])'s phone number to contact her to provide her with updates about the process. Patient verbalized understanding.  Thank you for involving pharmacy to assist in providing this patient's care.   Drexel Iha, PharmD PGY2 Ambulatory Care Pharmacy Resident

## 2020-01-15 DIAGNOSIS — M65351 Trigger finger, right little finger: Secondary | ICD-10-CM | POA: Insufficient documentation

## 2020-01-15 HISTORY — DX: Trigger finger, right little finger: M65.351

## 2020-01-15 MED ORDER — BASAGLAR KWIKPEN 100 UNIT/ML ~~LOC~~ SOPN: 30.0000 [IU] | PEN_INJECTOR | Freq: Every day | SUBCUTANEOUS | Status: DC

## 2020-01-15 NOTE — Assessment & Plan Note (Signed)
Unsure why patient continues to run out of of medication.  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  I think patient is not understanding that he needs to apply for financial assistance yearly to be able to receive help with medications. -Thank you to pharmacy for having to speak with myself and patient to be a further understanding of what needs to happen to be able to continue financial assistance. -Patient understands that he needs to provide information to [place], pharmacy, to start the process of applying for financial assistance -He was given Engineer, agricultural and Jardiance samples from the clinic. -Follow-up in 1-2 month

## 2020-01-15 NOTE — Assessment & Plan Note (Signed)
Patient has clicking of the left little finger on repetitive movement most consistent with trigger finger. -In collaboration with patient was decided to splint fingers with Band-Aid for a week or 2.  It does not resolve can consider steroid injection. -Follow-up as needed

## 2020-01-18 ENCOUNTER — Other Ambulatory Visit: Payer: Self-pay | Admitting: Pharmacy Technician

## 2020-01-18 NOTE — Patient Outreach (Signed)
[place] [facility]) Care Management  01/18/2020  Gregory Harrison 03-18-1951 [id number]   Received both patient and provider portion(s) of patient assistance application(s) for [employer]. Faxed completed application and required documents into BI and [employer] respectively.  Will follow up with company(ies) in 10-14 business days to check status of application(s).  [name] P. Prentiss Hammett, [place] Management [phone]

## 2020-01-19 ENCOUNTER — Telehealth: Payer: Self-pay | Admitting: Pharmacist

## 2020-01-19 DIAGNOSIS — E1142 Type 2 diabetes mellitus with diabetic polyneuropathy: Secondary | ICD-10-CM

## 2020-01-19 MED ORDER — JARDIANCE 25 MG PO TABS: 25.0000 mg | ORAL_TABLET | Freq: Every day | ORAL | 3 refills | Status: DC

## 2020-01-19 MED ORDER — BASAGLAR KWIKPEN 100 UNIT/ML ~~LOC~~ SOPN: 30.0000 [IU] | PEN_INJECTOR | Freq: Every day | SUBCUTANEOUS | 11 refills | Status: DC

## 2020-01-19 NOTE — Telephone Encounter (Signed)
Patient returned call RE Gregory Harrison ([name]) Medicare.   Patient has been approved for [name] and we discussed that his Gregory Harrison should cost $90 for 90 day supply.   We also discussed determining the cost of his Gregory Harrison insulin through [name].    I sent both prescriptions to his pharmacy for price evaluation.  We discussed that he can determine the cost and his ability to afford these costs.  We may need to continue with use of PAP support through [place] but we will wait for the patient to determine costs.

## 2020-01-19 NOTE — Telephone Encounter (Signed)
Noted and agree. 

## 2020-01-24 ENCOUNTER — Ambulatory Visit: Payer: PPO | Attending: Internal Medicine

## 2020-01-24 DIAGNOSIS — Z23 Encounter for immunization: Secondary | ICD-10-CM

## 2020-01-24 NOTE — Progress Notes (Signed)
   Covid-19 Vaccination Clinic  Name:  Gregory Harrison    MRN: [id number] DOB: October 06, 1951  01/24/2020  [name] was observed post Covid-19 immunization for 15 minutes without incidence. He was provided with Vaccine Information Sheet and instruction to access the V-Safe system.   [name] was instructed to call 911 with any severe reactions post vaccine: Marland Kitchen Difficulty breathing  . Swelling of your face and throat  . A fast heartbeat  . A bad rash all over your body  . Dizziness and weakness    Immunizations Administered    Name Date Dose VIS Date Route   Pfizer COVID-19 Vaccine 01/24/2020 11:42 AM 0.3 mL 11/19/2019 Intramuscular   Manufacturer: [place]   Lot: [id number]   [place]: [id number]

## 2020-01-25 ENCOUNTER — Ambulatory Visit: Payer: Self-pay | Admitting: Pharmacist

## 2020-01-25 DIAGNOSIS — E1142 Type 2 diabetes mellitus with diabetic polyneuropathy: Secondary | ICD-10-CM

## 2020-01-25 DIAGNOSIS — E113511 Type 2 diabetes mellitus with proliferative diabetic retinopathy with macular edema, right eye: Secondary | ICD-10-CM | POA: Diagnosis not present

## 2020-01-25 NOTE — Progress Notes (Signed)
  Chronic Care Management   Outreach Note 01/25/2020 Name: Gregory Harrison   MRN: [id number]       DOB: 03-20-1951  Referred by: [name], MD Reason for referral : Chronic Care Management (Diabetes)  -Patient is currently on Irene 25 units qAM, metformin, Jardiance -Holidays have been challenging for patient's eating habits -Jardiance restarted last week--BGs improving (FBG 130-160) -Last A1c 9.4% (uses one verio glucometer) -Financial documents obtained for patient assistance   Follow Up Plan: will f/u with patient in 2 weeks   SIGNATURE Regina Eck, PharmD, BCPS Clinical Pharmacist, [place]: [phone]

## 2020-01-26 ENCOUNTER — Other Ambulatory Visit: Payer: Self-pay | Admitting: Pharmacy Technician

## 2020-01-26 NOTE — Patient Outreach (Signed)
[place] [facility]) Care Management  01/26/2020  Gregory Harrison 1950-12-16 [id number]   Unsuccessful outreach call placed to patient in regards to [employer] application for [employer] and [place] application for [employer].  Unfortunately patient did not answer the phone, HIPAA compliant voicemail left.  Was calling patient because Ralph Ley[place] is requiring household income for the patient including that of his spouse if she has income. Received patient's update tax for 1099 from 2020 but in need of his spouses's income as he has listed 5 people in his household on the application but only has one income submitted.  Will route note to embedded [facility] RPh Lottie Dawson to update and assist in obtaining the income documents.  Phiona Ramnauth P. Jaselle Pryer, [place] Management [phone]

## 2020-01-26 NOTE — Patient Outreach (Signed)
[place] [facility]) Care Management  01/26/2020  Gregory Harrison 07-14-1951 [id number]   ADDENDUM  Care coordination call placed to BI in regards to patient's application for Jardiance.  Spoke to [place] who informed patient was APPROVED 01/24/2020-2/31/2021. Patient will receive a 90 days supply of medication delivered to his home. The medication is set to go out on Friday 01/27/2019 with an expected delivery in 7-10 business days.  Will outreach patient with this information.  Skyelynn Rambeau P. Nichlas Pitera, [place] Management [phone]

## 2020-01-26 NOTE — Patient Outreach (Signed)
[place] [facility]) Care Management  01/26/2020  QUAID GOCHNAUER 02-06-1951 [id number]    Care coordination call placed to [place] in regards to patient's application for [place].  Spoke to [place] who informed the application was received however the proof of income the patient submitted was actually his [name] determination letter. She informed valid proof of income would need to be submitted for the application to be processed. Those forms of income include one of the following, 2020 tax for 1099 or 2021 social security awards letter.  Will outreach patient with this information.  Shavon Ashmore P. Kenden Brandt, [place] Management [phone]

## 2020-01-27 ENCOUNTER — Telehealth: Payer: PPO

## 2020-02-02 ENCOUNTER — Other Ambulatory Visit: Payer: Self-pay | Admitting: Pharmacy Technician

## 2020-02-02 NOTE — Patient Outreach (Signed)
[place] [facility]) Care Management  02/02/2020  BENSEN SOLDANO November 30, 1951 [id number]  Care coordination call placed to [place] in regards to patient's application for [place].  Spoke to [place] who  informed patient was APPROVED 01/31/2020-12/08/2020. She informed patient would be contacted by [employer] [place] to set up shipment.  NOTE: Incoming phone call received from embedded RPh Lottie Dawson that patient has arranged for the medication to arrive Friday 02/04/2020.  Will outreach patient in 5-7 business days to confirm receipt of medications as patient was previously approved for Jardiance with BI as well.  Tadan Shill P. Emanuela Runnion, [place] Management [phone]

## 2020-02-03 ENCOUNTER — Telehealth: Payer: PPO

## 2020-02-03 ENCOUNTER — Other Ambulatory Visit: Payer: Self-pay

## 2020-02-08 ENCOUNTER — Other Ambulatory Visit: Payer: Self-pay | Admitting: Pharmacy Technician

## 2020-02-08 NOTE — Patient Outreach (Signed)
[place] [facility]) Care Management  02/08/2020  Gregory Harrison 11-Dec-1950 [id number]  Successful outreach call placed to patient in regards to [facility] application for [place] application for [employer].  Spoke to patient, HIPAA identifiers verified.  Patient informed he received 90 days supply of Jardiance. Discussed refill procedure with patient. Patient confirmed having the phone number to [facility] and was aware to place his refill request when is down to around a 2 week supply remaining.  Patient informed his [name] was due to be delivered tomorrow. Patient was informed to outreach me if his shipment did not arrive tomorrow. Discussed refill procedure with patient. Patient confirmed having the phone number to place his refill requests and was aware to call when he has approximately 3  pens remaining or a 2 weeks supply.  Patient informed he had no questions ro concerns. Confirmed patient had name and number.  Will route note to embedded [facility] RPh Lottie Dawson that patient assistance has been completed and will remove myself from care team.  Gregory Harrison. Gregory Harrison, [place]  [phone]

## 2020-02-09 NOTE — Patient Outreach (Signed)
[place] [facility]) Care Management  02/09/2020  Gregory Harrison July 01, 1951 [id number]    Incoming call from patient in which he left me a voicemail message to update me that he received his Basaglar from [place] via [place] this morning.  In the message he informed that he had no other questions or concerns and everything looked as he expected it to. Previously discussed refill procedure with him yesterday and he verbalized understanding.Previously confirmed he had name and number.  [name] P. Zayda Angell, [place]  [phone]

## 2020-02-16 ENCOUNTER — Telehealth: Payer: Self-pay | Admitting: *Deleted

## 2020-02-21 ENCOUNTER — Telehealth: Payer: Self-pay

## 2020-02-21 NOTE — Telephone Encounter (Signed)
LM for patient to call office to go over pre virtual appointment questions.  

## 2020-02-22 ENCOUNTER — Ambulatory Visit
Payer: PPO | Attending: Student in an Organized Health Care Education/Training Program | Admitting: Student in an Organized Health Care Education/Training Program

## 2020-02-22 ENCOUNTER — Other Ambulatory Visit: Payer: Self-pay

## 2020-02-22 ENCOUNTER — Telehealth: Payer: Self-pay

## 2020-02-22 ENCOUNTER — Encounter: Payer: Self-pay | Admitting: Student in an Organized Health Care Education/Training Program

## 2020-02-22 DIAGNOSIS — G894 Chronic pain syndrome: Secondary | ICD-10-CM | POA: Diagnosis not present

## 2020-02-22 DIAGNOSIS — M25512 Pain in left shoulder: Secondary | ICD-10-CM

## 2020-02-22 DIAGNOSIS — M5136 Other intervertebral disc degeneration, lumbar region: Secondary | ICD-10-CM

## 2020-02-22 DIAGNOSIS — G5601 Carpal tunnel syndrome, right upper limb: Secondary | ICD-10-CM | POA: Diagnosis not present

## 2020-02-22 DIAGNOSIS — E1142 Type 2 diabetes mellitus with diabetic polyneuropathy: Secondary | ICD-10-CM | POA: Diagnosis not present

## 2020-02-22 DIAGNOSIS — M65311 Trigger thumb, right thumb: Secondary | ICD-10-CM

## 2020-02-22 DIAGNOSIS — M47816 Spondylosis without myelopathy or radiculopathy, lumbar region: Secondary | ICD-10-CM | POA: Diagnosis not present

## 2020-02-22 MED ORDER — HYDROCODONE-ACETAMINOPHEN 5-325 MG PO TABS: 0.5000 | ORAL_TABLET | Freq: Every day | ORAL | 0 refills | Status: DC | PRN

## 2020-02-22 NOTE — Progress Notes (Signed)
Patient: Gregory Harrison  Service Category: E/M  Provider: Gillis Santa, MD  DOB: [birth year]  DOS: 02/22/2020  Location: Office  MRN: [id number]  Setting: Ambulatory outpatient  Referring Provider: [name], MD  Type: Established Patient  Specialty: Interventional Pain Management  PCP: [name], MD  Location: Home  Delivery: TeleHealth     Virtual Encounter - Pain Management PROVIDER NOTE: Information contained herein reflects review and annotations entered in association with encounter. Interpretation of such information and data should be left to medically-trained personnel. Information provided to patient can be located elsewhere in the medical record under "Patient Instructions". Document created using STT-dictation technology, any transcriptional errors that may result from process are unintentional.    Contact & Pharmacy Preferred: [phone] Home: [phone] (home) Mobile: [phone] (mobile) E-mail: No e-mail address on record  [place], [place] - [place] [address] Phone: [phone] Fax: [phone]  [id number] [place] [address] [address]Phone: [phone]: [phone]  Pre-screening  Mr. CHousmanoffered "in-person" vs "virtual" encounter. He indicated preferring virtual for this encounter.   Reason COVID-19*  Social distancing based on CDC and AMA recommendations.   I contacted [name] 02/22/2020 via telephone.      I clearly identified myself as BGillis Santa MD. I verified that I was speaking with the correct person using two identifiers (Name: OGERTRUDE TARBET and date of birth: 715-22-1952.  This visit was completed via telephone due to the restrictions of the COVID-19 pandemic. All issues as above were discussed and addressed but no physical exam was performed. If it was felt that the patient should be  evaluated in the office, they were directed there. The patient verbally consented to this visit. Patient was unable to complete an audio/visual visit due to Technical difficulties and/or Lack of internet. Due to the catastrophic nature of the COVID-19 pandemic, this visit was done through audio contact only.  Location of the patient: home address (see Epic for details)  Location of the provider: office  Consent I sought verbal advanced consent from OMadaline Brilliantfor virtual visit interactions. I informed Mr. CAlpernof possible security and privacy concerns, risks, and limitations associated with providing "not-in-person" medical evaluation and management services. I also informed Mr. CWindtof the availability of "in-person" appointments. Finally, I informed him that there would be a charge for the virtual visit and that he could be  personally, fully or partially, financially responsible for it. Mr. CLandiexpressed understanding and agreed to proceed.   Historic Elements   Mr. OHEROLD SALGUEROis a 69y.o. year old, male patient evaluated today after his last contact with our practice on 02/22/2020. Mr. CManuele has a past medical history of Arthritis, Diabetes ([place] (2000), Diabetic neuropathy ([place], GERD (gastroesophageal reflux disease), HTN (hypertension) (2010), Hypercholesterolemia, and Vision problem. He also  has a past surgical history that includes Hand surgery (1980s); Nose surgery (1979); and Eye surgery (Left, 1990s). Mr. CLacockhas a current medication list which includes the following prescription(s): alcohol swabs, atorvastatin, onetouch verio, diclofenac sodium, jardiance, gabapentin, onetouch verio, hydrochlorothiazide, hydrocodone-acetaminophen, basaglar kwikpen, one touch delica lancing dev, losartan, meloxicam, metformin, and onetouch delica lancets fine. He  reports that he has quit smoking. He has a 40.00 pack-year smoking history. He has never used smokeless tobacco. He reports  previous drug use. He reports that he does not drink  alcohol. Mr. Chura has No Known Allergies.   HPI  Today, he is being contacted for medication management.   Had 2 falls in the last 2 weeks (first 1 was related to him slipping on line and the second 1 was with him getting out of the bathtub)  patient's pain is not at baseline as he is experiencing acute pain from his fall.  Patient continues multimodal pain regimen as prescribed.  States that it provides some pain relief and improvement in functional status.  Pharmacotherapy Assessment  Analgesic: 12/28/2019  2   12/28/2019  Hydrocodone-Acetamin 5-325 MG  30.00  30 Bi Lat   [id number]   Nor (2372)   0  5.00 MME  Medicare  Medicare Insurance coverage [place]     Monitoring: [place] PMP: PDMP reviewed during this encounter.       Pharmacotherapy: No side-effects or adverse reactions reported. Compliance: No problems identified. Effectiveness: Clinically acceptable. Plan: Refer to "POC".  UDS:  Summary  Date Value Ref Range Status  10/25/2019 Note  Final    Comment:    ==================================================================== Compliance Drug Analysis, Ur ==================================================================== Test                             Result       Flag       Units Drug Present and Declared for Prescription Verification   Gabapentin                     PRESENT      EXPECTED   Acetaminophen                  PRESENT      EXPECTED Drug Absent but Declared for Prescription Verification   Hydrocodone                    Not Detected UNEXPECTED ng/mg creat   Tramadol                       Not Detected UNEXPECTED ng/mg creat   Diclofenac                     Not Detected UNEXPECTED    Diclofenac, as indicated in the declared medication list, is not    always detected even when used as directed. ==================================================================== Test                      Result    Flag   Units       Ref Range   Creatinine              33               mg/dL      >=20 ==================================================================== Declared Medications:  The flagging and interpretation on this report are based on the  following declared medications.  Unexpected results may arise from  inaccuracies in the declared medications.  **Note: The testing scope of this panel includes these medications:  Gabapentin (Neurontin)  Hydrocodone (Norco)  Tramadol  **Note: The testing scope of this panel does not include small to  moderate amounts of these reported medications:  Acetaminophen (Norco)  Diclofenac (Voltaren)  **Note: The testing scope of this panel does not include the  following reported medications:  Atorvastatin (Lipitor)  Hydrochlorothiazide (Hydrodiuril)  Losartan (Cozaar)  Meloxicam (Mobic)  Metformin  Polyethylene Glycol  Topical ==================================================================== For clinical consultation, please call [phone]. ====================================================================    Laboratory Chemistry Profile   Renal Lab Results  Component Value Date   BUN 14 07/29/2019   CREATININE 0.87 07/29/2019   BCR 16 07/29/2019   GFRAA 101 07/29/2019   GFRNONAA 87 07/29/2019    Hepatic Lab Results  Component Value Date   AST 16 07/15/2019   ALT 20 07/15/2019   ALBUMIN 4.9 (H) 07/15/2019   ALKPHOS 64 07/15/2019    Electrolytes Lab Results  Component Value Date   NA 141 07/29/2019   K 4.5 07/29/2019   CL 103 07/29/2019   CALCIUM 9.8 07/29/2019    Bone No results found for: VD25OH, VD125OH2TOT, MA2633HL4, TG2563SL3, 25OHVITD1, 25OHVITD2, 25OHVITD3, TESTOFREE, TESTOSTERONE  Inflammation (CRP: Acute Phase) (ESR: Chronic Phase) No results found for: CRP, ESRSEDRATE, LATICACIDVEN    Note: Above Lab results reviewed.  Imaging  DG Lumbar Spine Complete CLINICAL DATA:  Acute onset of right-sided low back pain  radiating to the buttock.  EXAM: LUMBAR SPINE - COMPLETE 4+ VIEW  COMPARISON:  None.  FINDINGS: Five lumbar type vertebral bodies show normal alignment. Disc space heights are normal at L3-4 and above. Mild disc space narrowing at L4-5. Moderate disc space narrowing at L5-S1. No evidence of fracture or focal lesion. There is ordinary lower lumbar facet osteoarthritis.  IMPRESSION: Degenerative disc disease more pronounced at L5-S1 than L4-5. Ordinary lower lumbar facet osteoarthritis.  Electronically Signed   By: Nelson Chimes M.D.   On: 07/31/2018 14:07 DG Pelvis 1-2 Views CLINICAL DATA:  Acute onset of right-sided low back pain radiating to the buttock.  EXAM: PELVIS - 1-2 VIEW  COMPARISON:  None.  FINDINGS: Bones of the pelvis appear normal. No traumatic finding. There is osteoarthritis of both hips with mild joint space narrowing. Right sacroiliac joint appears normal. Mild osteoarthritis of the left sacroiliac joint.  IMPRESSION: Mild osteoarthritis of both hip joints. Mild osteoarthritis of the left sacroiliac joint.  Electronically Signed   By: Nelson Chimes M.D.   On: 07/31/2018 14:06  Assessment  The primary encounter diagnosis was Chronic pain syndrome. Diagnoses of Diabetic polyneuropathy associated with type 2 diabetes mellitus ([place]), Carpal tunnel syndrome of right wrist, Trigger finger of right thumb, Pain in joint of left shoulder, Lumbar facet arthropathy, and Lumbar degenerative disc disease were also pertinent to this visit.  Plan of Care  Mr. ARASH KARSTENS has a current medication list which includes the following long-term medication(s): atorvastatin, gabapentin, hydrochlorothiazide, basaglar kwikpen, losartan, and metformin.  Pharmacotherapy (Medications Ordered): Meds ordered this encounter  Medications  . HYDROcodone-acetaminophen (NORCO/VICODIN) 5-325 MG tablet    Sig: Take 0.5-1 tablets by mouth daily as needed for severe pain. Must last  30 days.    Dispense:  30 tablet    Refill:  0    Chronic Pain. (STOP Act - Not applicable). Fill one day early if closed on scheduled refill date.   Follow-up plan:   Return in about 8 weeks (around 04/18/2020) for Medication Management, virtual.     Recent Visits Date Type Provider Dept  12/28/19 Office Visit Gillis Santa, MD [facility]  Showing recent visits within past 90 days and meeting all other requirements   Today's Visits Date Type Provider Dept  02/22/20 Office Visit Gillis Santa, MD [facility]  Showing today's visits and meeting all other requirements   Future Appointments No visits were found meeting these conditions.  Showing future appointments within next 90 days  and meeting all other requirements   I discussed the assessment and treatment plan with the patient. The patient was provided an opportunity to ask questions and all were answered. The patient agreed with the plan and demonstrated an understanding of the instructions.  Patient advised to call back or seek an in-person evaluation if the symptoms or condition worsens.  Duration of encounter: 6mnutes.  Note by: BGillis Santa MD Date: 02/22/2020; Time: 9:46 AM

## 2020-02-22 NOTE — Progress Notes (Signed)
3

## 2020-02-22 NOTE — Telephone Encounter (Signed)
Attempted to call patient to review medications for todays visit. No answer. Left message to call us back.

## 2020-04-04 ENCOUNTER — Telehealth: Payer: Self-pay

## 2020-04-04 NOTE — Telephone Encounter (Signed)
Called patient to review meds for VV on 04/05/20. No answer, left message on AM to call our office.

## 2020-04-05 ENCOUNTER — Encounter: Payer: Self-pay | Admitting: Student in an Organized Health Care Education/Training Program

## 2020-04-05 ENCOUNTER — Other Ambulatory Visit: Payer: Self-pay

## 2020-04-05 ENCOUNTER — Telehealth: Payer: Self-pay

## 2020-04-05 ENCOUNTER — Ambulatory Visit
Payer: PPO | Attending: Student in an Organized Health Care Education/Training Program | Admitting: Student in an Organized Health Care Education/Training Program

## 2020-04-05 DIAGNOSIS — G894 Chronic pain syndrome: Secondary | ICD-10-CM | POA: Insufficient documentation

## 2020-04-05 DIAGNOSIS — M19012 Primary osteoarthritis, left shoulder: Secondary | ICD-10-CM | POA: Diagnosis not present

## 2020-04-05 DIAGNOSIS — G8929 Other chronic pain: Secondary | ICD-10-CM | POA: Insufficient documentation

## 2020-04-05 DIAGNOSIS — E1142 Type 2 diabetes mellitus with diabetic polyneuropathy: Secondary | ICD-10-CM | POA: Insufficient documentation

## 2020-04-05 DIAGNOSIS — M25512 Pain in left shoulder: Secondary | ICD-10-CM | POA: Insufficient documentation

## 2020-04-05 MED ORDER — HYDROCODONE-ACETAMINOPHEN 5-325 MG PO TABS: 1.0000 | ORAL_TABLET | Freq: Every day | ORAL | 0 refills | Status: DC | PRN

## 2020-04-05 NOTE — Telephone Encounter (Signed)
Left vm for him to call back to schedule shoulder injection and June med refill. No answer.

## 2020-04-05 NOTE — Progress Notes (Signed)
Patient: Gregory Harrison  Service Category: E/M  Provider: Gillis Santa, MD  DOB: 02/23/1951  DOS: 04/05/2020  Location: Office  MRN: [id number]  Setting: Ambulatory outpatient  Referring Provider: [name], MD  Type: Established Patient  Specialty: Interventional Pain Management  PCP: Gregory Leitz, MD  Location: Work  Delivery: TeleHealth     Virtual Encounter - Pain Management PROVIDER NOTE: Information contained herein reflects review and annotations entered in association with encounter. Interpretation of such information and data should be left to medically-trained personnel. Information provided to patient can be located elsewhere in the medical record under "Patient Instructions". Document created using STT-dictation technology, any transcriptional errors that may result from process are unintentional.    Contact & Pharmacy Preferred: [phone] Home: [phone] (home) Mobile: [phone] (mobile) E-mail: No e-mail address on record  [place], [place] - [place] [address] Phone: [phone] Fax: [phone]  [id number] [place] [address] [address]Phone: [phone]: [phone]  Pre-screening  Gregory Harrison "in-person" vs "virtual" encounter. He indicated preferring virtual for this encounter.   Reason COVID-19*  Social distancing based on CDC and AMA recommendations.   I contacted Gregory Harrison 04/05/2020 via video conference.      I clearly identified myself as BGillis Santa MD. I verified that I was speaking with the correct person using two identifiers (Name: Gregory Harrison and date of birth: 7Apr 09, 1952.  Consent I sought verbal advanced consent from Gregory Brilliantfor virtual visit interactions. I informed Gregory Harrison possible security and privacy concerns, risks, and limitations associated with providing  "not-in-person" medical evaluation and management services. I also informed Gregory Harrison the availability of "in-person" appointments. Finally, I informed him that there would be a charge for the virtual visit and that he could be  personally, fully or partially, financially responsible for it. Gregory Harrison understanding and agreed to proceed.   Historic Elements   [name], male patient evaluated today after his last contact with our practice on 04/04/2020. Gregory Harrison has a past medical history of Arthritis, Diabetes ([place] (2000), Diabetic neuropathy ([place], GERD (gastroesophageal reflux disease), HTN (hypertension) (2010), Hypercholesterolemia, and Vision problem. He also  has a past surgical history that includes Hand surgery (1980s); Nose surgery (1979); and Eye surgery (Left, 1990s). [name] which includes the following prescription(s): alcohol swabs, atorvastatin, onetouch verio, diclofenac sodium, jardiance, gabapentin, onetouch verio, hydrochlorothiazide, hydrocodone-acetaminophen, [START ON 05/05/2020] hydrocodone-acetaminophen, basaglar kwikpen, one touch delica lancing dev, losartan, meloxicam, metformin, and onetouch delica lancets fine. He  reports that he has quit smoking. He has a 40.00 pack-year smoking history. He has never used smokeless tobacco. He reports previous drug use. He reports that he does not drink alcohol. [name].   HPI  Today, he is being contacted for medication management.  Gregory Harrison of bathtub, slipped on floor in mid March.  Is having left shoulder pain that is worse with shoulder abduction.  He states that the soreness in his neck is improving.  He utilizes 1 hydrocodone at night for his pain.  He is requesting a left shoulder steroid injection and a refill of his medications.  Pharmacotherapy Assessment  Analgesic: 02/22/2020  2   02/22/2020  Hydrocodone-Acetamin 5-325 MG   30.00  30 Gregory  Harrison   [id number]   Nor (2372)   0  5.00 MME  Medicare   Gregory Harrison     Monitoring: [place] PMP: PDMP reviewed during this encounter.       Pharmacotherapy: No side-effects or adverse reactions reported. Compliance: No problems identified. Effectiveness: Clinically acceptable. Plan: Refer to "POC".  UDS:  Summary  Date Value Ref Range Status  10/25/2019 Note  Final    Comment:    ==================================================================== Compliance Drug Analysis, Ur ==================================================================== Test                             Result       Flag       Units Drug Present and Declared for Prescription Verification   Gabapentin                     PRESENT      EXPECTED   Acetaminophen                  PRESENT      EXPECTED Drug Absent but Declared for Prescription Verification   Hydrocodone                    Not Detected UNEXPECTED ng/mg creat   Tramadol                       Not Detected UNEXPECTED ng/mg creat   Diclofenac                     Not Detected UNEXPECTED    Diclofenac, as indicated in the declared medication list, is not    always detected even when used as directed. ==================================================================== Test                      Result    Flag   Units      Ref Range   Creatinine              33               mg/dL      >=20 ==================================================================== Declared Medications:  The flagging and interpretation on this report are based on the  following declared medications.  Unexpected results may arise from  inaccuracies in the declared medications.  **Note: The testing scope of this panel includes these medications:  Gabapentin (Neurontin)  Hydrocodone (Norco)  Tramadol  **Note: The testing scope of this panel does not include small to  moderate amounts of these reported medications:  Acetaminophen (Norco)  Diclofenac (Voltaren)  **Note: The  testing scope of this panel does not include the  following reported medications:  Atorvastatin (Lipitor)  Hydrochlorothiazide (Hydrodiuril)  Losartan (Cozaar)  Meloxicam (Mobic)  Metformin  Polyethylene Glycol  Topical ==================================================================== For clinical consultation, please call [phone]. ====================================================================    Laboratory Chemistry Profile   Renal Lab Results  Component Value Date   BUN 14 07/29/2019   CREATININE 0.87 07/29/2019   BCR 16 07/29/2019   GFRAA 101 07/29/2019   GFRNONAA 87 07/29/2019     Hepatic Lab Results  Component Value Date   AST 16 07/15/2019   ALT 20 07/15/2019   ALBUMIN 4.9 (H) 07/15/2019   ALKPHOS 64 07/15/2019     Electrolytes Lab Results  Component Value Date   NA 141 07/29/2019   K 4.5 07/29/2019   CL 103 07/29/2019  CALCIUM 9.8 07/29/2019     Bone No results found for: VD25OH, [id number], NT6144RX5, QM0867YP9, 25OHVITD1, 25OHVITD2, 25OHVITD3, TESTOFREE, TESTOSTERONE   Inflammation (CRP: Acute Phase) (ESR: Chronic Phase) No results found for: CRP, ESRSEDRATE, LATICACIDVEN     Note: Above Lab results reviewed.  Imaging  DG Lumbar Spine Complete CLINICAL DATA:  Acute onset of right-sided low back pain radiating to the buttock.  EXAM: LUMBAR SPINE - COMPLETE 4+ VIEW  COMPARISON:  None.  FINDINGS: Five lumbar type vertebral bodies show normal alignment. Disc space heights are normal at L3-4 and above. Mild disc space narrowing at L4-5. Moderate disc space narrowing at L5-S1. No evidence of fracture or focal lesion. There is ordinary lower lumbar facet osteoarthritis.  IMPRESSION: Degenerative disc disease more pronounced at L5-S1 than L4-5. Ordinary lower lumbar facet osteoarthritis.  Electronically Signed   By: Nelson Chimes M.D.   On: 07/31/2018 14:07 DG Pelvis 1-2 Views CLINICAL DATA:  Acute onset of right-sided low  back pain radiating to the buttock.  EXAM: PELVIS - 1-2 VIEW  COMPARISON:  None.  FINDINGS: Bones of the pelvis appear normal. No traumatic finding. There is osteoarthritis of both hips with mild joint space narrowing. Right sacroiliac joint appears normal. Mild osteoarthritis of the left sacroiliac joint.  IMPRESSION: Mild osteoarthritis of both hip joints. Mild osteoarthritis of the left sacroiliac joint.  Electronically Signed   By: Nelson Chimes M.D.   On: 07/31/2018 14:06  Assessment  The primary encounter diagnosis was Chronic pain syndrome. Diagnoses of Diabetic polyneuropathy associated with type 2 diabetes mellitus ([place]), Chronic left shoulder pain, and Primary osteoarthritis of left shoulder were also pertinent to this visit.  Plan of Care  Mr. ZYRION COEY has a current medication list which includes the following long-term medication(s): atorvastatin, gabapentin, hydrochlorothiazide, basaglar kwikpen, losartan, and metformin.  1. Chronic pain syndrome -Continue Voltaren gel, gabapentin 400 mg 3 times daily, Mobic as needed - HYDROcodone-acetaminophen (NORCO/VICODIN) 5-325 MG tablet; Take 1 tablet by mouth daily as needed for severe pain. Must last 30 days.  Dispense: 30 tablet; Refill: 0 - HYDROcodone-acetaminophen (NORCO/VICODIN) 5-325 MG tablet; Take 1 tablet by mouth daily as needed for severe pain. Must last 30 days.  Dispense: 30 tablet; Refill: 0 - SHOULDER INJECTION; Future  2. Diabetic polyneuropathy associated with type 2 diabetes mellitus (HCC) -Gabapentin 40 mg 3 times daily - HYDROcodone-acetaminophen (NORCO/VICODIN) 5-325 MG tablet; Take 1 tablet by mouth daily as needed for severe pain. Must last 30 days.  Dispense: 30 tablet; Refill: 0 - HYDROcodone-acetaminophen (NORCO/VICODIN) 5-325 MG tablet; Take 1 tablet by mouth daily as needed for severe pain. Must last 30 days.  Dispense: 30 tablet; Refill: 0  3. Chronic left shoulder pain -Mobic 7.5 mg daily  as needed - SHOULDER INJECTION; Future  4. Primary osteoarthritis of left shoulder -Mobic 7.5 mg daily as needed - SHOULDER INJECTION; Future   Pharmacotherapy (Medications Ordered): Meds ordered this encounter  Medications  . HYDROcodone-acetaminophen (NORCO/VICODIN) 5-325 MG tablet    Sig: Take 1 tablet by mouth daily as needed for severe pain. Must last 30 days.    Dispense:  30 tablet    Refill:  0    Chronic Pain. (STOP Act - Not applicable). Fill one day early if closed on scheduled refill date.  Marland Kitchen HYDROcodone-acetaminophen (NORCO/VICODIN) 5-325 MG tablet    Sig: Take 1 tablet by mouth daily as needed for severe pain. Must last 30 days.    Dispense:  30 tablet  Refill:  0    Chronic Pain. (STOP Act - Not applicable). Fill one day early if closed on scheduled refill date.   Orders:  Orders Placed This Encounter  Procedures  . SHOULDER INJECTION    Standing Status:   Future    Standing Expiration Date:   05/05/2020    Scheduling Instructions:     Side:LEFT     Sedation: Without Sedation.     Timeframe: As soon as schedule allows    Order Specific Question:   Where will this procedure be performed?    Answer:   [facility]    Comments:   Milina Pagett   Follow-up plan:   Return for Left shoulder steroid injection without sedation.    Recent Visits Date Type Provider Dept  02/22/20 Office Visit Gregory Santa, MD [facility]  Showing recent visits within past 90 days and meeting all other requirements   Today's Visits Date Type Provider Dept  04/05/20 Telemedicine Gregory Santa, MD [facility]  Showing today's visits and meeting all other requirements   Future Appointments No visits were found meeting these conditions.  Showing future appointments within next 90 days and meeting all other requirements   I discussed the assessment and treatment plan with the patient. The patient was provided an opportunity to ask questions and all were  answered. The patient agreed with the plan and demonstrated an understanding of the instructions.  Patient advised to call back or seek an in-person evaluation if the symptoms or condition worsens.  Duration of encounter: 86mnutes.  Note by: BGillis Santa MD Date: 04/05/2020; Time: 2:15 PM

## 2020-04-19 ENCOUNTER — Other Ambulatory Visit: Payer: Self-pay | Admitting: Family Medicine

## 2020-04-19 DIAGNOSIS — M65311 Trigger thumb, right thumb: Secondary | ICD-10-CM

## 2020-04-19 DIAGNOSIS — E1142 Type 2 diabetes mellitus with diabetic polyneuropathy: Secondary | ICD-10-CM

## 2020-04-19 DIAGNOSIS — E7800 Pure hypercholesterolemia, unspecified: Secondary | ICD-10-CM

## 2020-05-01 ENCOUNTER — Encounter: Payer: Self-pay | Admitting: Student in an Organized Health Care Education/Training Program

## 2020-05-01 ENCOUNTER — Ambulatory Visit (HOSPITAL_BASED_OUTPATIENT_CLINIC_OR_DEPARTMENT_OTHER): Payer: PPO | Admitting: Student in an Organized Health Care Education/Training Program

## 2020-05-01 ENCOUNTER — Ambulatory Visit
Admission: RE | Admit: 2020-05-01 | Discharge: 2020-05-01 | Disposition: A | Discharge status: Home / Self Care | Payer: PPO | Source: Ambulatory Visit | Attending: Student in an Organized Health Care Education/Training Program | Admitting: Student in an Organized Health Care Education/Training Program

## 2020-05-01 ENCOUNTER — Other Ambulatory Visit: Payer: Self-pay | Admitting: Student in an Organized Health Care Education/Training Program

## 2020-05-01 ENCOUNTER — Other Ambulatory Visit: Payer: Self-pay

## 2020-05-01 VITALS — BP 177/79 | HR 60 | Temp 98.2°F | Resp 16 | Ht 74.0 in | Wt 184.0 lb

## 2020-05-01 DIAGNOSIS — M25512 Pain in left shoulder: Secondary | ICD-10-CM | POA: Insufficient documentation

## 2020-05-01 DIAGNOSIS — R52 Pain, unspecified: Secondary | ICD-10-CM | POA: Insufficient documentation

## 2020-05-01 DIAGNOSIS — G8929 Other chronic pain: Secondary | ICD-10-CM | POA: Diagnosis not present

## 2020-05-01 DIAGNOSIS — G894 Chronic pain syndrome: Secondary | ICD-10-CM | POA: Diagnosis not present

## 2020-05-01 DIAGNOSIS — M19012 Primary osteoarthritis, left shoulder: Secondary | ICD-10-CM

## 2020-05-01 MED ORDER — METHYLPREDNISOLONE ACETATE 40 MG/ML IJ SUSP
40.0000 mg | Freq: Once | INTRAMUSCULAR | Status: AC
  Administered 2020-05-01: 40 mg via INTRA_ARTICULAR
  Filled 2020-05-01: qty 1

## 2020-05-01 MED ORDER — ROPIVACAINE HCL 2 MG/ML IJ SOLN
INTRAMUSCULAR | Status: AC
  Filled 2020-05-01: qty 10

## 2020-05-01 MED ORDER — ROPIVACAINE HCL 2 MG/ML IJ SOLN
4.0000 mL | Freq: Once | INTRAMUSCULAR | Status: AC
  Administered 2020-05-01: 4 mL via INTRA_ARTICULAR

## 2020-05-01 MED ORDER — LIDOCAINE HCL 2 % IJ SOLN
INTRAMUSCULAR | Status: AC
  Filled 2020-05-01: qty 20

## 2020-05-01 MED ORDER — DEXAMETHASONE SODIUM PHOSPHATE 10 MG/ML IJ SOLN
INTRAMUSCULAR | Status: AC
  Filled 2020-05-01: qty 1

## 2020-05-01 NOTE — Progress Notes (Signed)
Safety precautions to be maintained throughout the outpatient stay will include: orient to surroundings, keep bed in low position, maintain call bell within reach at all times, provide assistance with transfer out of bed and ambulation.  

## 2020-05-01 NOTE — Progress Notes (Signed)
PROVIDER NOTE: Information contained herein reflects review and annotations entered in association with encounter. Interpretation of such information and data should be left to medically-trained personnel. Information provided to patient can be located elsewhere in the medical record under "Patient Instructions". Document created using STT-dictation technology, any transcriptional errors that may result from process are unintentional.    Patient: Gregory Harrison  Service Category: Procedure  Provider: Gillis Santa, MD  DOB: Oct 27, 1951  DOS: 05/01/2020  Location: [facility]  MRN: [id number]  Setting: Ambulatory - outpatient  Referring Provider: [name], MD  Type: Established Patient  Specialty: Interventional Pain Management  PCP: [name], MD   Primary Reason for Visit: Interventional Pain Management Treatment. CC: Shoulder Pain (left)  Procedure:          Anesthesia, Analgesia, Anxiolysis:  Type: Diagnostic Glenohumeral Joint (shoulder) Injection #1  Primary Purpose: Diagnostic Region: Superior Shoulder Area Level:  Shoulder Target Area: Glenohumeral Joint (shoulder) Approach: Posterior approach. Laterality: Left-Sided  Type: Local Anesthesia -2%  Position: Supine   Indications: 1. Chronic left shoulder pain   2. Primary osteoarthritis of left shoulder   3. Chronic pain syndrome    Pain Score: Pre-procedure: 10-Worst pain ever/10 Post-procedure: 1 (moving arm around)/10   Pre-op Assessment:  Mr. Ranft is a 69 y.o. (year old), male patient, seen today for interventional treatment. He  has a past surgical history that includes Hand surgery (1980s); Nose surgery (1979); and Eye surgery (Left, 1990s). Mr. Stach has a current medication list which includes the following prescription(s): alcohol swabs, atorvastatin, onetouch verio, diclofenac sodium, jardiance, gabapentin, onetouch verio, hydrochlorothiazide, hydrocodone-acetaminophen, [START ON 05/05/2020]  hydrocodone-acetaminophen, basaglar kwikpen, one touch delica lancing dev, losartan, meloxicam, metformin, onetouch delica lancets fine, [DISCONTINUED] atorvastatin, [DISCONTINUED] gabapentin, and [DISCONTINUED] meloxicam. His primarily concern today is the Shoulder Pain (left)  Initial Vital Signs:  Pulse/HCG Rate: 60  Temp: 98.2 F (36.8 C) Resp: 18 BP: (!) 185/81 SpO2: 100 %  BMI: Estimated body mass index is 23.62 kg/m as calculated from the following:   Height as of this encounter: 6\' 2"  (1.88 m).   Weight as of this encounter: 184 lb (83.5 kg).  Risk Assessment: Allergies: Reviewed. He has No Known Allergies.  Allergy Precautions: None required Coagulopathies: Reviewed. None identified.  Blood-thinner therapy: None at this time Active Infection(s): Reviewed. None identified. Mr. Hatton is afebrile  Site Confirmation: Mr. Barberi was asked to confirm the procedure and laterality before marking the site Procedure checklist: Completed Consent: Before the procedure and under the influence of no sedative(s), amnesic(s), or anxiolytics, the patient was informed of the treatment options, risks and possible complications. To fulfill our ethical and legal obligations, as recommended by the American Medical Association's Code of Ethics, I have informed the patient of my clinical impression; the nature and purpose of the treatment or procedure; the risks, benefits, and possible complications of the intervention; the alternatives, including doing nothing; the risk(s) and benefit(s) of the alternative treatment(s) or procedure(s); and the risk(s) and benefit(s) of doing nothing. The patient was provided information about the general risks and possible complications associated with the procedure. These may include, but are not limited to: failure to achieve desired goals, infection, bleeding, organ or nerve damage, allergic reactions, paralysis, and death. In addition, the patient was informed of  those risks and complications associated to the procedure, such as failure to decrease pain; infection; bleeding; organ or nerve damage with subsequent damage to sensory, motor, and/or autonomic systems, resulting in permanent pain,  numbness, and/or weakness of one or several areas of the body; allergic reactions; (i.e.: anaphylactic reaction); and/or death. Furthermore, the patient was informed of those risks and complications associated with the medications. These include, but are not limited to: allergic reactions (i.e.: anaphylactic or anaphylactoid reaction(s)); adrenal axis suppression; blood sugar elevation that in diabetics may result in ketoacidosis or comma; water retention that in patients with history of congestive heart failure may result in shortness of breath, pulmonary edema, and decompensation with resultant heart failure; weight gain; swelling or edema; medication-induced neural toxicity; particulate matter embolism and blood vessel occlusion with resultant organ, and/or nervous system infarction; and/or aseptic necrosis of one or more joints. Finally, the patient was informed that Medicine is not an exact science; therefore, there is also the possibility of unforeseen or unpredictable risks and/or possible complications that may result in a catastrophic outcome. The patient indicated having understood very clearly. We have given the patient no guarantees and we have made no promises. Enough time was given to the patient to ask questions, all of which were answered to the patient's satisfaction. Mr. Mizera has indicated that he wanted to continue with the procedure. Attestation: I, the ordering provider, attest that I have discussed with the patient the benefits, risks, side-effects, alternatives, likelihood of achieving goals, and potential problems during recovery for the procedure that I have provided informed consent. Date  Time: 05/01/2020  9:30 AM  Pre-Procedure Preparation:    Monitoring: As per clinic protocol. Respiration, ETCO2, SpO2, BP, heart rate and rhythm monitor placed and checked for adequate function Safety Precautions: Patient was assessed for positional comfort and pressure points before starting the procedure. Time-out: I initiated and conducted the "Time-out" before starting the procedure, as per protocol. The patient was asked to participate by confirming the accuracy of the "Time Out" information. Verification of the correct person, site, and procedure were performed and confirmed by me, the nursing staff, and the patient. "Time-out" conducted as per Joint Commission's Universal Protocol (UP.01.01.01). Time: 1024  Description of Procedure:          Area Prepped: Entire shoulder Area DuraPrep (Iodine Povacrylex [0.7% available iodine] and Isopropyl Alcohol, 74% w/w) Safety Precautions: Aspiration looking for blood return was conducted prior to all injections. At no point did we inject any substances, as a needle was being advanced. No attempts were made at seeking any paresthesias. Safe injection practices and needle disposal techniques used. Medications properly checked for expiration dates. SDV (single dose vial) medications used. Description of the Procedure: Protocol guidelines were followed. The patient was placed in position over the procedure table. The target area was identified and the area prepped in the usual manner. Skin & deeper tissues infiltrated with local anesthetic. Appropriate amount of time allowed to pass for local anesthetics to take effect. The procedure needles were then advanced to the target area. Proper needle placement secured. Negative aspiration confirmed. Solution injected in intermittent fashion, asking for systemic symptoms every 0.5cc of injectate. The needles were then removed and the area cleansed, making sure to leave some of the prepping solution back to take advantage of its long term bactericidal properties.          Vitals:   05/01/20 0932 05/01/20 1015 05/01/20 1025 05/01/20 1032  BP: (!) 185/81 (!) 192/88 (!) 190/86 (!) 177/79  Pulse: 60 67 (!) 58 60  Resp: 18 19 17 16   Temp: 98.2 F (36.8 C)     TempSrc: Temporal     SpO2: 100% 98%  99% 98%  Weight: 184 lb (83.5 kg)     Height: 6\' 2"  (1.88 m)       Start Time: 1024 hrs. End Time: 1027 hrs. Materials:  Needle(s) Type: Spinal Needle Gauge: 25G Length: 3.5-in Medication(s): Please see orders for medications and dosing details.  5 cc solution made of 4 cc of 0.2% ropivacaine, 1 cc of methylprednisolone 40 mg/cc. Imaging Guidance (Non-Spinal):          Type of Imaging Technique: Fluoroscopy Guidance (Non-Spinal) Indication(s): Assistance in needle guidance and placement for procedures requiring needle placement in or near specific anatomical locations not easily accessible without such assistance. Exposure Time: Please see nurses notes. Contrast: Before injecting any contrast, we confirmed that the patient did not have an allergy to iodine, shellfish, or radiological contrast. Once satisfactory needle placement was completed at the desired level, radiological contrast was injected. Contrast injected under live fluoroscopy. No contrast complications. See chart for type and volume of contrast used. Fluoroscopic Guidance: I was personally present during the use of fluoroscopy. "Tunnel Vision Technique" used to obtain the best possible view of the target area. Parallax error corrected before commencing the procedure. "Direction-depth-direction" technique used to introduce the needle under continuous pulsed fluoroscopy. Once target was reached, antero-posterior, oblique, and lateral fluoroscopic projection used confirm needle placement in all planes. Images permanently stored in EMR. Interpretation: I personally interpreted the imaging intraoperatively. Adequate needle placement confirmed in multiple planes. Appropriate spread of contrast into desired  area was observed. No evidence of afferent or efferent intravascular uptake. Permanent images saved into the patient's record.  Antibiotic Prophylaxis:   Anti-infectives (From admission, onward)   None     Indication(s): None identified  Post-operative Assessment:  Post-procedure Vital Signs:  Pulse/HCG Rate: 60  Temp: 98.2 F (36.8 C) Resp: 16 BP: (!) 177/79 SpO2: 98 %  EBL: None  Complications: No immediate post-treatment complications observed by team, or reported by patient.  Note: The patient tolerated the entire procedure well. A repeat set of vitals were taken after the procedure and the patient was kept under observation following institutional policy, for this type of procedure. Post-procedural neurological assessment was performed, showing return to baseline, prior to discharge. The patient was provided with post-procedure discharge instructions, including a section on how to identify potential problems. Should any problems arise concerning this procedure, the patient was given instructions to immediately contact us, at any time, without hesitation. In any case, we plan to contact the patient by telephone for a follow-up status report regarding this interventional procedure.  Comments:  No additional relevant information.  Plan of Care   Medications ordered for procedure: Meds ordered this encounter  Medications  . ropivacaine (PF) 2 mg/mL (0.2%) (NAROPIN) injection 4 mL  . methylPREDNISolone acetate (DEPO-MEDROL) injection 40 mg   Medications administered: We administered ropivacaine (PF) 2 mg/mL (0.2%) and methylPREDNISolone acetate.  See the medical record for exact dosing, route, and time of administration.  Follow-up plan:   Return for Keep sch. appt.     Recent Visits Date Type Provider Dept  04/05/20 Telemedicine Gillis Santa, MD [facility]  02/22/20 Office Visit Gillis Santa, MD [facility]  Showing recent visits within past 90  days and meeting all other requirements   Today's Visits Date Type Provider Dept  05/01/20 Procedure visit Gillis Santa, MD [facility]  Showing today's visits and meeting all other requirements   Future Appointments Date Type Provider Dept  05/25/20 Appointment Gillis Santa, MD [facility]  Showing future appointments within next 90 days and meeting all other requirements   Disposition: Discharge home  Discharge (Date  Time): 05/01/2020; 1039 hrs.   Primary Care Physician: [name], MD Location: [facility] Note by: Gillis Santa, MD Date: 05/01/2020; Time: 1:11 PM  Disclaimer:  Medicine is not an exact science. The only guarantee in medicine is that nothing is guaranteed. It is important to note that the decision to proceed with this intervention was based on the information collected from the patient. The Data and conclusions were drawn from the patient's questionnaire, the interview, and the physical examination. Because the information was provided in large part by the patient, it cannot be guaranteed that it has not been purposely or unconsciously manipulated. Every effort has been made to obtain as much relevant data as possible for this evaluation. It is important to note that the conclusions that lead to this procedure are derived in large part from the available data. Always take into account that the treatment will also be dependent on availability of resources and existing treatment guidelines, considered by other Pain Management Practitioners as being common knowledge and practice, at the time of the intervention. For Medico-Legal purposes, it is also important to point out that variation in procedural techniques and pharmacological choices are the acceptable norm. The indications, contraindications, technique, and results of the above procedure should only be interpreted and judged by a Board-Certified Interventional Pain  Specialist with extensive familiarity and expertise in the same exact procedure and technique.

## 2020-05-01 NOTE — Patient Instructions (Signed)

## 2020-05-02 ENCOUNTER — Telehealth: Payer: Self-pay

## 2020-05-02 NOTE — Telephone Encounter (Signed)
Denies any needs at this time. Instructed to call us if needed.

## 2020-05-05 ENCOUNTER — Other Ambulatory Visit: Payer: Self-pay | Admitting: Family Medicine

## 2020-05-05 DIAGNOSIS — M65311 Trigger thumb, right thumb: Secondary | ICD-10-CM

## 2020-05-25 ENCOUNTER — Other Ambulatory Visit: Payer: Self-pay | Admitting: Family Medicine

## 2020-05-25 ENCOUNTER — Encounter: Payer: Self-pay | Admitting: Student in an Organized Health Care Education/Training Program

## 2020-05-25 ENCOUNTER — Ambulatory Visit
Payer: PPO | Attending: Student in an Organized Health Care Education/Training Program | Admitting: Student in an Organized Health Care Education/Training Program

## 2020-05-25 ENCOUNTER — Other Ambulatory Visit: Payer: Self-pay

## 2020-05-25 VITALS — BP 180/69 | HR 64 | Temp 98.7°F | Resp 16 | Ht 74.0 in | Wt 184.0 lb

## 2020-05-25 DIAGNOSIS — M65311 Trigger thumb, right thumb: Secondary | ICD-10-CM

## 2020-05-25 DIAGNOSIS — G8929 Other chronic pain: Secondary | ICD-10-CM | POA: Diagnosis not present

## 2020-05-25 DIAGNOSIS — G894 Chronic pain syndrome: Secondary | ICD-10-CM

## 2020-05-25 DIAGNOSIS — G5601 Carpal tunnel syndrome, right upper limb: Secondary | ICD-10-CM | POA: Diagnosis not present

## 2020-05-25 DIAGNOSIS — M19012 Primary osteoarthritis, left shoulder: Secondary | ICD-10-CM | POA: Diagnosis not present

## 2020-05-25 DIAGNOSIS — E1142 Type 2 diabetes mellitus with diabetic polyneuropathy: Secondary | ICD-10-CM | POA: Diagnosis not present

## 2020-05-25 DIAGNOSIS — M25512 Pain in left shoulder: Secondary | ICD-10-CM

## 2020-05-25 DIAGNOSIS — M47816 Spondylosis without myelopathy or radiculopathy, lumbar region: Secondary | ICD-10-CM | POA: Diagnosis not present

## 2020-05-25 MED ORDER — HYDROCODONE-ACETAMINOPHEN 5-325 MG PO TABS: 1.0000 | ORAL_TABLET | Freq: Every day | ORAL | 0 refills | Status: DC | PRN

## 2020-05-25 NOTE — Progress Notes (Signed)
PROVIDER NOTE: Information contained herein reflects review and annotations entered in association with encounter. Interpretation of such information and data should be left to medically-trained personnel. Information provided to patient can be located elsewhere in the medical record under "Patient Instructions". Document created using STT-dictation technology, any transcriptional errors that may result from process are unintentional.    Patient: Gregory Harrison  Service Category: E/M  Provider: Gillis Santa, MD  DOB: [birth year]  DOS: 05/25/2020  Specialty: Interventional Pain Management  MRN: [id number]  Setting: Ambulatory outpatient  PCP: [name], MD  Type: Established Patient    Referring Provider: [name], MD  Location: Office  Delivery: Face-to-face     HPI  Reason for encounter: Mr. DIOR STEPTER, a 2 y.o. year old male, is here today for evaluation and management of his Chronic left shoulder pain [M25.512, G89.29]. Mr. Peckham primary complain today is Knee Pain (bilateral), Hand Pain (bilateral, arthritis ), and Generalized Body Aches (arthritis ) Last encounter: Practice (05/02/2020). My last encounter with him was on 05/01/2020. Pertinent problems: Mr. Gamino has Diabetes [facility]); Chronic left shoulder pain; Carpal tunnel syndrome of right wrist; Trigger finger of right thumb; Lumbar facet arthropathy; Lumbar degenerative disc disease; Bilateral primary osteoarthritis of knee; Primary osteoarthritis of left shoulder; and Trigger finger, right little finger on their pertinent problem list. Pain Assessment: Severity of Chronic pain is reported as a 9 /10. Location: Knee (see visit info for additional pain sites.) Left, Right/denies. Onset: More than a month ago. Quality: Discomfort, Constant, Other (Comment) (feet and hands feel like they are being shocked.). Timing: Constant. Modifying factor(s): pain medications. Vitals:  height is '6\' 2"'  (1.88 m) and weight is 184 lb (83.5 kg). His temporal  temperature is 98.7 F (37.1 C). His blood pressure is 180/69 (abnormal) and his pulse is 64. His respiration is 16 and oxygen saturation is 99%.    Patient follows up today for medication management and postprocedural evaluation.  He is status post left shoulder glenohumeral joint injection on 05/01/2020.  Patient states that the injection was helpful for his left shoulder pain and range of motion.  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  Patient does find benefit with hydrocodone.  He takes very seldomly at 5 mg daily as needed.  He is requesting an increase so they can take 1 to 2 tablets when he is having a severe pain flare.  60 tablets for 90 days.  This is reasonable.  We will refill as below.  Of note patient is also endorsing pain along the base of his thenar eminence.  There is hypertrophy at this level.  This was ultrasounded and showed mild tendinopathy.  Recommend ice to this region to help with swelling and recommend patient utilize support gloves when he is painting.  Pharmacotherapy Assessment   04/05/2020  2   04/05/2020  Hydrocodone-Acetamin 5-325 MG  30.00  30 Bi Lat   [id number]   Nor (2372)   0  5.00 MME  Medicare   [place]      Monitoring: [place] PMP: PDMP reviewed during this encounter.       Pharmacotherapy: No side-effects or adverse reactions reported. Compliance: No problems identified. Effectiveness: Clinically acceptable.  UDS:  Summary  Date Value Ref Range Status  10/25/2019 Note  Final    Comment:    ==================================================================== Compliance Drug Analysis, Ur ==================================================================== Test  Result       Flag       Units Drug Present and Declared for Prescription Verification   Gabapentin                     PRESENT      EXPECTED   Acetaminophen                  PRESENT      EXPECTED Drug Absent but Declared for Prescription Verification    Hydrocodone                    Not Detected UNEXPECTED ng/mg creat   Tramadol                       Not Detected UNEXPECTED ng/mg creat   Diclofenac                     Not Detected UNEXPECTED    Diclofenac, as indicated in the declared medication list, is not    always detected even when used as directed. ==================================================================== Test                      Result    Flag   Units      Ref Range   Creatinine              33               mg/dL      >=20 ==================================================================== Declared Medications:  The flagging and interpretation on this report are based on the  following declared medications.  Unexpected results may arise from  inaccuracies in the declared medications.  **Note: The testing scope of this panel includes these medications:  Gabapentin (Neurontin)  Hydrocodone (Norco)  Tramadol  **Note: The testing scope of this panel does not include small to  moderate amounts of these reported medications:  Acetaminophen (Norco)  Diclofenac (Voltaren)  **Note: The testing scope of this panel does not include the  following reported medications:  Atorvastatin (Lipitor)  Hydrochlorothiazide (Hydrodiuril)  Losartan (Cozaar)  Meloxicam (Mobic)  Metformin  Polyethylene Glycol  Topical ==================================================================== For clinical consultation, please call [phone]. ====================================================================       ROS  Constitutional: Denies any fever or chills Gastrointestinal: No reported hemesis, hematochezia, vomiting, or acute GI distress Musculoskeletal: Denies any acute onset joint swelling, redness, loss of ROM, or weakness Neurological: No reported episodes of acute onset apraxia, aphasia, dysarthria, agnosia, amnesia, paralysis, loss of coordination, or loss of consciousness  Medication Review  Alcohol Swabs,  Basaglar KwikPen, HYDROcodone-acetaminophen, ONE TOUCH DELICA LANCING DEV, OneTouch Delica Lancets Fine, OneTouch Verio, atorvastatin, diclofenac sodium, empagliflozin, gabapentin, glucose blood, hydrochlorothiazide, losartan, meloxicam, and metFORMIN  History Review  Allergy: Mr. Heart has No Known Allergies. Drug: Mr. Heinze  reports previous drug use. Alcohol:  reports no history of alcohol use. Tobacco:  reports that he has quit smoking. He has a 40.00 pack-year smoking history. He has never used smokeless tobacco. Social: Mr. Dubose  reports that he has quit smoking. He has a 40.00 pack-year smoking history. He has never used smokeless tobacco. He reports previous drug use. He reports that he does not drink alcohol. Medical:  has a past medical history of Arthritis, Diabetes ([place]) (2000), Diabetic neuropathy ([place]), GERD (gastroesophageal reflux disease), HTN (hypertension) (2010), Hypercholesterolemia, and Vision problem. Surgical: Mr. Riegler  has a past surgical history that includes  Hand surgery (1980s); Nose surgery (1979); and Eye surgery (Left, 1990s). Family: family history includes ALS in his brother; Diabetes in his paternal grandfather; Heart attack in his father; Heart murmur in his father; Other in his mother.  Laboratory Chemistry Profile   Renal Lab Results  Component Value Date   BUN 14 07/29/2019   CREATININE 0.87 07/29/2019   BCR 16 07/29/2019   GFRAA 101 07/29/2019   GFRNONAA 87 07/29/2019     Hepatic Lab Results  Component Value Date   AST 16 07/15/2019   ALT 20 07/15/2019   ALBUMIN 4.9 (H) 07/15/2019   ALKPHOS 64 07/15/2019     Electrolytes Lab Results  Component Value Date   NA 141 07/29/2019   K 4.5 07/29/2019   CL 103 07/29/2019   CALCIUM 9.8 07/29/2019     Bone No results found for: VD25OH, VD125OH2TOT, NU2725DG6, YQ0347QQ5, 25OHVITD1, 25OHVITD2, 25OHVITD3, TESTOFREE, TESTOSTERONE   Inflammation (CRP: Acute Phase) (ESR: Chronic Phase) No  results found for: CRP, ESRSEDRATE, LATICACIDVEN     Note: Above Lab results reviewed.  Recent Imaging Review  DG PAIN CLINIC C-ARM 1-60 MIN NO REPORT Fluoro was used, but no Radiologist interpretation will be provided.  Please refer to "NOTES" tab for provider progress note. Note: Reviewed        Physical Exam  General appearance: Well nourished, well developed, and well hydrated. In no apparent acute distress Mental status: Alert, oriented x 3 (person, place, & time)       Respiratory: No evidence of acute respiratory distress Eyes: PERLA Vitals: BP (!) 180/69 (BP Location: Left Arm, Patient Position: Sitting, Cuff Size: Normal)    Pulse 64    Temp 98.7 F (37.1 C) (Temporal)    Resp 16    Ht '6\' 2"'  (1.88 m)    Wt 184 lb (83.5 kg)    SpO2 99%    BMI 23.62 kg/m  BMI: Estimated body mass index is 23.62 kg/m as calculated from the following:   Height as of this encounter: '6\' 2"'  (1.88 m).   Weight as of this encounter: 184 lb (83.5 kg). Ideal: Ideal body weight: 82.2 kg (181 lb 3.5 oz) Adjusted ideal body weight: 82.7 kg (182 lb 5.3 oz)  Cervical Spine Area Exam  Skin & Axial Inspection: No masses, redness, edema, swelling, or associated skin lesions Alignment: Symmetrical Functional ROM: Pain restricted ROM      Stability: No instability detected Muscle Tone/Strength: Functionally intact. No obvious neuro-muscular anomalies detected. Sensory (Neurological): Musculoskeletal pain pattern Palpation: No palpable anomalies             Upper Extremity (UE) Exam    Side: Right upper extremity  Side: Left upper extremity  Skin & Extremity Inspection: Skin color, temperature, and hair growth are WNL. No peripheral edema or cyanosis. No masses, redness, swelling, asymmetry, or associated skin lesions. No contractures.  Skin & Extremity Inspection: Skin color, temperature, and hair growth are WNL. No peripheral edema or cyanosis. No masses, redness, swelling, asymmetry, or associated skin  lesions. No contractures.  Functional ROM: Pain restricted ROM thumb          Functional ROM: Unrestricted ROM          Muscle Tone/Strength: Functionally intact. No obvious neuro-muscular anomalies detected.  Muscle Tone/Strength: Functionally intact. No obvious neuro-muscular anomalies detected.  Sensory (Neurological): Arthropathic arthralgia          Sensory (Neurological): Unimpaired          Palpation: No palpable  anomalies              Palpation: No palpable anomalies              Provocative Test(s):  Phalen's test: deferred Tinel's test: deferred Apley's scratch test (touch opposite shoulder):  Action 1 (Across chest): deferred Action 2 (Overhead): deferred Action 3 (LB reach): deferred   Provocative Test(s):  Phalen's test: deferred Tinel's test: deferred Apley's scratch test (touch opposite shoulder):  Action 1 (Across chest): deferred Action 2 (Overhead): deferred Action 3 (LB reach): deferred     Assessment   Status Diagnosis  Controlled Controlled Controlled 1. Chronic left shoulder pain   2. Primary osteoarthritis of left shoulder   3. Diabetic polyneuropathy associated with type 2 diabetes mellitus (HCC)   4. Carpal tunnel syndrome of right wrist   5. Trigger finger of right thumb   6. Pain in joint of left shoulder   7. Lumbar facet arthropathy   8. Chronic pain syndrome      Updated Problems: Problem  Trigger Finger, Right Little Finger  Lumbar Facet Arthropathy  Lumbar Degenerative Disc Disease  Bilateral Primary Osteoarthritis of Knee  Primary Osteoarthritis of Left Shoulder  Carpal Tunnel Syndrome of Right Wrist  Trigger Finger of Right Thumb  Chronic Left Shoulder Pain  Diabetes (Hcc)    Plan of Care   Mr. DOC MANDALA has a current medication list which includes the following long-term medication(s): atorvastatin, gabapentin, hydrochlorothiazide, basaglar kwikpen, losartan, metformin, [DISCONTINUED] atorvastatin, and [DISCONTINUED]  gabapentin.  Pharmacotherapy (Medications Ordered): Meds ordered this encounter  Medications   HYDROcodone-acetaminophen (NORCO/VICODIN) 5-325 MG tablet    Sig: Take 1-2 tablets by mouth daily as needed for severe pain. Must last 30 days.    Dispense:  60 tablet    Refill:  0    Chronic Pain. (STOP Act - Not applicable). Fill one day early if closed on scheduled refill date.    I also performed an ultrasound at his thenar eminence base given pain and discomfort that the patient was experiencing this region.  There is mild swelling and tendinopathy.  No abscess or fluid collection noted.  Follow-up plan:   Return in about 3 months (around 08/25/2020) for Medication Management, in person.   Recent Visits Date Type Provider Dept  05/01/20 Procedure visit Gillis Santa, MD [facility]  04/05/20 Telemedicine Gillis Santa, MD [facility]  Showing recent visits within past 90 days and meeting all other requirements Today's Visits Date Type Provider Dept  05/25/20 Office Visit Gillis Santa, MD [facility]  Showing today's visits and meeting all other requirements Future Appointments No visits were found meeting these conditions. Showing future appointments within next 90 days and meeting all other requirements  I discussed the assessment and treatment plan with the patient. The patient was provided an opportunity to ask questions and all were answered. The patient agreed with the plan and demonstrated an understanding of the instructions.  Patient advised to call back or seek an in-person evaluation if the symptoms or condition worsens.  Duration of encounter: 52mnutes.  Note by: BGillis Santa MD Date: 05/25/2020; Time: 10:08 AM

## 2020-05-25 NOTE — Progress Notes (Signed)
Nursing Pain Medication Assessment:  Safety precautions to be maintained throughout the outpatient stay will include: orient to surroundings, keep bed in low position, maintain call bell within reach at all times, provide assistance with transfer out of bed and ambulation.  Medication Inspection Compliance: Mr. Londo did not comply with our request to bring his pills to be counted. He was reminded that bringing the medication bottles, even when empty, is a requirement.  Medication: None brought in. Pill/Patch Count: None available to be counted. Bottle Appearance: No container available. Did not bring bottle(s) to appointment. Filled Date: N/A Last Medication intake:  Day before yesterday

## 2020-06-01 DIAGNOSIS — H3582 Retinal ischemia: Secondary | ICD-10-CM | POA: Diagnosis not present

## 2020-06-01 DIAGNOSIS — E113512 Type 2 diabetes mellitus with proliferative diabetic retinopathy with macular edema, left eye: Secondary | ICD-10-CM | POA: Diagnosis not present

## 2020-06-01 DIAGNOSIS — E113511 Type 2 diabetes mellitus with proliferative diabetic retinopathy with macular edema, right eye: Secondary | ICD-10-CM | POA: Diagnosis not present

## 2020-06-01 DIAGNOSIS — H2513 Age-related nuclear cataract, bilateral: Secondary | ICD-10-CM | POA: Diagnosis not present

## 2020-06-22 ENCOUNTER — Encounter: Payer: Self-pay | Admitting: Family Medicine

## 2020-06-22 DIAGNOSIS — H2513 Age-related nuclear cataract, bilateral: Secondary | ICD-10-CM | POA: Diagnosis not present

## 2020-06-22 DIAGNOSIS — E113393 Type 2 diabetes mellitus with moderate nonproliferative diabetic retinopathy without macular edema, bilateral: Secondary | ICD-10-CM | POA: Diagnosis not present

## 2020-06-22 DIAGNOSIS — H53021 Refractive amblyopia, right eye: Secondary | ICD-10-CM | POA: Diagnosis not present

## 2020-06-22 LAB — HM DIABETES EYE EXAM

## 2020-06-27 DIAGNOSIS — E113512 Type 2 diabetes mellitus with proliferative diabetic retinopathy with macular edema, left eye: Secondary | ICD-10-CM | POA: Diagnosis not present

## 2020-06-27 DIAGNOSIS — H31003 Unspecified chorioretinal scars, bilateral: Secondary | ICD-10-CM | POA: Diagnosis not present

## 2020-06-27 DIAGNOSIS — E113511 Type 2 diabetes mellitus with proliferative diabetic retinopathy with macular edema, right eye: Secondary | ICD-10-CM | POA: Diagnosis not present

## 2020-06-27 DIAGNOSIS — H2513 Age-related nuclear cataract, bilateral: Secondary | ICD-10-CM | POA: Diagnosis not present

## 2020-06-27 DIAGNOSIS — H3582 Retinal ischemia: Secondary | ICD-10-CM | POA: Diagnosis not present

## 2020-07-03 ENCOUNTER — Telehealth: Payer: Self-pay | Admitting: *Deleted

## 2020-07-04 ENCOUNTER — Other Ambulatory Visit: Payer: Self-pay

## 2020-07-04 ENCOUNTER — Encounter: Payer: Self-pay | Admitting: Student in an Organized Health Care Education/Training Program

## 2020-07-04 ENCOUNTER — Ambulatory Visit
Payer: PPO | Attending: Student in an Organized Health Care Education/Training Program | Admitting: Student in an Organized Health Care Education/Training Program

## 2020-07-04 VITALS — BP 166/85 | HR 64 | Temp 97.2°F | Resp 17 | Ht 74.5 in | Wt 184.0 lb

## 2020-07-04 DIAGNOSIS — G894 Chronic pain syndrome: Secondary | ICD-10-CM | POA: Diagnosis not present

## 2020-07-04 DIAGNOSIS — M19012 Primary osteoarthritis, left shoulder: Secondary | ICD-10-CM | POA: Diagnosis not present

## 2020-07-04 DIAGNOSIS — E1142 Type 2 diabetes mellitus with diabetic polyneuropathy: Secondary | ICD-10-CM | POA: Insufficient documentation

## 2020-07-04 DIAGNOSIS — M47816 Spondylosis without myelopathy or radiculopathy, lumbar region: Secondary | ICD-10-CM

## 2020-07-04 DIAGNOSIS — M65311 Trigger thumb, right thumb: Secondary | ICD-10-CM | POA: Insufficient documentation

## 2020-07-04 DIAGNOSIS — G5601 Carpal tunnel syndrome, right upper limb: Secondary | ICD-10-CM | POA: Diagnosis not present

## 2020-07-04 DIAGNOSIS — G8929 Other chronic pain: Secondary | ICD-10-CM | POA: Insufficient documentation

## 2020-07-04 DIAGNOSIS — M25512 Pain in left shoulder: Secondary | ICD-10-CM | POA: Insufficient documentation

## 2020-07-04 MED ORDER — HYDROCODONE-ACETAMINOPHEN 5-325 MG PO TABS: 1.0000 | ORAL_TABLET | Freq: Two times a day (BID) | ORAL | 0 refills | Status: DC | PRN

## 2020-07-04 NOTE — Progress Notes (Signed)
Nursing Pain Medication Assessment:  Safety precautions to be maintained throughout the outpatient stay will include: orient to surroundings, keep bed in low position, maintain call bell within reach at all times, provide assistance with transfer out of bed and ambulation.  Medication Inspection Compliance: Pill count conducted under aseptic conditions, in front of the patient. Neither the pills nor the bottle was removed from the patient's sight at any time. Once count was completed pills were immediately returned to the patient in their original bottle.  Medication: Hydrocodone/APAP Pill/Patch Count: 2 of 60 pills remain Pill/Patch Appearance: Markings consistent with prescribed medication Bottle Appearance: Standard pharmacy container. Clearly labeled. Filled Date:05/25/2020 Last Medication intake:  Today

## 2020-07-04 NOTE — Progress Notes (Signed)
PROVIDER NOTE: Information contained herein reflects review and annotations entered in association with encounter. Interpretation of such information and data should be left to medically-trained personnel. Information provided to patient can be located elsewhere in the medical record under "Patient Instructions". Document created using STT-dictation technology, any transcriptional errors that may result from process are unintentional.    Patient: [name]  Service Category: E/M  Provider: Gillis Santa, MD  DOB: 1951/05/27  DOS: 07/04/2020  Specialty: Interventional Pain Management  MRN: [id number]  Setting: Ambulatory outpatient  PCP: [name], MD  Type: Established Patient    Referring Provider: [name], MD  Location: Office  Delivery: Face-to-face     HPI  Reason for encounter: Mr. AJEET CASASOLA, a 69 y.o. year old male, is here today for evaluation and management of his Chronic left shoulder pain [M25.512, G89.29]. Mr. Molyneux primary complain today is Back Pain, Leg Pain, and Hand Pain Last encounter: Practice (07/03/2020). My last encounter with him was on 05/25/2020. Pertinent problems: Mr. Lindahl has Diabetes [facility]); Chronic left shoulder pain; Carpal tunnel syndrome of right wrist; Trigger finger of right thumb; Lumbar facet arthropathy; Lumbar degenerative disc disease; Bilateral primary osteoarthritis of knee; Primary osteoarthritis of left shoulder; and Trigger finger, right little finger on their pertinent problem list. Pain Assessment: Severity of Chronic pain is reported as a 9 /10. Location: Back  /denies. Onset: More than a month ago. Quality: Sharp, Burning. Timing: Constant. Modifying factor(s): pain medication. Vitals:  height is 6' 2.5" (1.892 m) and weight is 184 lb (83.5 kg). His temporal temperature is 97.2 F (36.2 C) (abnormal). His blood pressure is 166/85 (abnormal) and his pulse is 64. His respiration is 17 and oxygen saturation is 100%.   No change in medical  history since last visit.  Patient's pain is at baseline.  Patient continues multimodal pain regimen as prescribed.  States that it provides pain relief and improvement in functional status.   Pharmacotherapy Assessment   05/25/2020  2   05/25/2020  Hydrocodone-Acetamin 5-325 MG  60.00  30 Bi Lat   [id number]   Nor (2372)   0/0  10.00 MME  Medicare   [place]    Analgesic:Norco 5 mg BID prn  Monitoring: [place] PMP: PDMP reviewed during this encounter.       Pharmacotherapy: No side-effects or adverse reactions reported. Compliance: No problems identified. Effectiveness: Clinically acceptable.  Landis Martins, RN  07/04/2020 10:35 AM  Sign when Signing Visit Nursing Pain Medication Assessment:  Safety precautions to be maintained throughout the outpatient stay will include: orient to surroundings, keep bed in low position, maintain call bell within reach at all times, provide assistance with transfer out of bed and ambulation.  Medication Inspection Compliance: Pill count conducted under aseptic conditions, in front of the patient. Neither the pills nor the bottle was removed from the patient's sight at any time. Once count was completed pills were immediately returned to the patient in their original bottle.  Medication: Hydrocodone/APAP Pill/Patch Count: 2 of 60 pills remain Pill/Patch Appearance: Markings consistent with prescribed medication Bottle Appearance: Standard pharmacy container. Clearly labeled. Filled Date:05/25/2020 Last Medication intake:  Today    UDS:  Summary  Date Value Ref Range Status  10/25/2019 Note  Final    Comment:    ==================================================================== Compliance Drug Analysis, Ur ==================================================================== Test  Result       Flag       Units Drug Present and Declared for Prescription Verification   Gabapentin                     PRESENT      EXPECTED    Acetaminophen                  PRESENT      EXPECTED Drug Absent but Declared for Prescription Verification   Hydrocodone                    Not Detected UNEXPECTED ng/mg creat   Tramadol                       Not Detected UNEXPECTED ng/mg creat   Diclofenac                     Not Detected UNEXPECTED    Diclofenac, as indicated in the declared medication list, is not    always detected even when used as directed. ==================================================================== Test                      Result    Flag   Units      Ref Range   Creatinine              33               mg/dL      >=20 ==================================================================== Declared Medications:  The flagging and interpretation on this report are based on the  following declared medications.  Unexpected results may arise from  inaccuracies in the declared medications.  **Note: The testing scope of this panel includes these medications:  Gabapentin (Neurontin)  Hydrocodone (Norco)  Tramadol  **Note: The testing scope of this panel does not include small to  moderate amounts of these reported medications:  Acetaminophen (Norco)  Diclofenac (Voltaren)  **Note: The testing scope of this panel does not include the  following reported medications:  Atorvastatin (Lipitor)  Hydrochlorothiazide (Hydrodiuril)  Losartan (Cozaar)  Meloxicam (Mobic)  Metformin  Polyethylene Glycol  Topical ==================================================================== For clinical consultation, please call [phone]. ====================================================================      ROS  Constitutional: Denies any fever or chills Gastrointestinal: No reported hemesis, hematochezia, vomiting, or acute GI distress Musculoskeletal: Denies any acute onset joint swelling, redness, loss of ROM, or weakness Neurological: No reported episodes of acute onset apraxia, aphasia, dysarthria, agnosia,  amnesia, paralysis, loss of coordination, or loss of consciousness  Medication Review  Alcohol Swabs, Basaglar KwikPen, HYDROcodone-acetaminophen, ONE TOUCH DELICA LANCING DEV, OneTouch Delica Lancets Fine, OneTouch Verio, atorvastatin, empagliflozin, gabapentin, glucose blood, hydrochlorothiazide, losartan, meloxicam, and metFORMIN  History Review  Allergy: Mr. Forti has No Known Allergies. Drug: Mr. Palladino  reports previous drug use. Alcohol:  reports no history of alcohol use. Tobacco:  reports that he has quit smoking. He has a 40.00 pack-year smoking history. He has never used smokeless tobacco. Social: Mr. Kotowski  reports that he has quit smoking. He has a 40.00 pack-year smoking history. He has never used smokeless tobacco. He reports previous drug use. He reports that he does not drink alcohol. Medical:  has a past medical history of Arthritis, Diabetes ([place]) (2000), Diabetic neuropathy ([place]), GERD (gastroesophageal reflux disease), HTN (hypertension) (2010), Hypercholesterolemia, and Vision problem. Surgical: Mr. Helt  has a past surgical history that includes Hand surgery (1980s);  Nose surgery (1979); and Eye surgery (Left, 1990s). Family: family history includes ALS in his brother; Diabetes in his paternal grandfather; Heart attack in his father; Heart murmur in his father; Other in his mother.  Laboratory Chemistry Profile   Renal Lab Results  Component Value Date   BUN 14 07/29/2019   CREATININE 0.87 07/29/2019   BCR 16 07/29/2019   GFRAA 101 07/29/2019   GFRNONAA 87 07/29/2019     Hepatic Lab Results  Component Value Date   AST 16 07/15/2019   ALT 20 07/15/2019   ALBUMIN 4.9 (H) 07/15/2019   ALKPHOS 64 07/15/2019     Electrolytes Lab Results  Component Value Date   NA 141 07/29/2019   K 4.5 07/29/2019   CL 103 07/29/2019   CALCIUM 9.8 07/29/2019     Bone No results found for: VD25OH, VD125OH2TOT, UU7253GU4, QI3474QV9, 25OHVITD1, 25OHVITD2, 25OHVITD3,  TESTOFREE, TESTOSTERONE   Inflammation (CRP: Acute Phase) (ESR: Chronic Phase) No results found for: CRP, ESRSEDRATE, LATICACIDVEN     Note: Above Lab results reviewed.    Physical Exam  General appearance: Well nourished, well developed, and well hydrated. In no apparent acute distress Mental status: Alert, oriented x 3 (person, place, & time)       Respiratory: No evidence of acute respiratory distress Eyes: PERLA Vitals: BP (!) 166/85 (BP Location: Right Arm, Patient Position: Sitting, Cuff Size: Normal)   Pulse 64   Temp (!) 97.2 F (36.2 C) (Temporal)   Resp 17   Ht 6' 2.5" (1.892 m)   Wt 184 lb (83.5 kg)   SpO2 100%   BMI 23.31 kg/m  BMI: Estimated body mass index is 23.31 kg/m as calculated from the following:   Height as of this encounter: 6' 2.5" (1.892 m).   Weight as of this encounter: 184 lb (83.5 kg). Ideal: Ideal body weight: 83.4 kg (183 lb 12.1 oz) Adjusted ideal body weight: 83.4 kg (183 lb 13.6 oz)  Cervical Spine Area Exam  Skin & Axial Inspection: No masses, redness, edema, swelling, or associated skin lesions Alignment: Symmetrical Functional ROM: Decreased ROM      Stability: No instability detected Muscle Tone/Strength: Functionally intact. No obvious neuro-muscular anomalies detected. Sensory (Neurological): Musculoskeletal pain pattern Palpation: No palpable anomalies               Upper Extremity (UE) Exam    Side: Right upper extremity  Side: Left upper extremity  Skin & Extremity Inspection: Skin color, temperature, and hair growth are WNL. No peripheral edema or cyanosis. No masses, redness, swelling, asymmetry, or associated skin lesions. No contractures.  Skin & Extremity Inspection: Skin color, temperature, and hair growth are WNL. No peripheral edema or cyanosis. No masses, redness, swelling, asymmetry, or associated skin lesions. No contractures.  Functional ROM: Unrestricted ROM          Functional ROM: Pain restricted ROM for shoulder   Muscle Tone/Strength: Functionally intact. No obvious neuro-muscular anomalies detected.  Muscle Tone/Strength: Functionally intact. No obvious neuro-muscular anomalies detected.  Sensory (Neurological): Unimpaired          Sensory (Neurological): Musculoskeletal pain pattern          Palpation: No palpable anomalies              Palpation: No palpable anomalies              Provocative Test(s):  Phalen's test: deferred Tinel's test: deferred Apley's scratch test (touch opposite shoulder):  Action 1 (Across chest): deferred Action  2 (Overhead): deferred Action 3 (LB reach): deferred   Provocative Test(s):  Phalen's test: deferred Tinel's test: deferred Apley's scratch test (touch opposite shoulder):  Action 1 (Across chest): Decreased ROM Action 2 (Overhead): Decreased ROM Action 3 (LB reach): Decreased ROM     Lumbar Spine Area Exam  Skin & Axial Inspection: No masses, redness, or swelling Alignment: Symmetrical Functional ROM: Pain restricted ROM       Stability: No instability detected Muscle Tone/Strength: Functionally intact. No obvious neuro-muscular anomalies detected. Sensory (Neurological): Musculoskeletal pain pattern  Lumbar Spine Area Exam  Skin & Axial Inspection: No masses, redness, or swelling Alignment: Symmetrical Functional ROM: Unrestricted ROM       Stability: No instability detected Muscle Tone/Strength: Functionally intact. No obvious neuro-muscular anomalies detected. Sensory (Neurological): Musculoskeletal pain pattern   Gait & Posture Assessment  Ambulation: Unassisted Gait: Relatively normal for age and body habitus Posture: WNL     Assessment   Status Diagnosis  Controlled Controlled Controlled 1. Chronic left shoulder pain   2. Primary osteoarthritis of left shoulder   3. Diabetic polyneuropathy associated with type 2 diabetes mellitus (HCC)   4. Carpal tunnel syndrome of right wrist   5. Trigger finger of right thumb   6. Pain in  joint of left shoulder   7. Lumbar facet arthropathy   8. Chronic pain syndrome       Plan of Care   Mr. RUDIE SERMONS has a current medication list which includes the following long-term medication(s): atorvastatin, gabapentin, hydrochlorothiazide, basaglar kwikpen, losartan, metformin, [DISCONTINUED] atorvastatin, and [DISCONTINUED] gabapentin.  Pharmacotherapy (Medications Ordered): Meds ordered this encounter  Medications  . HYDROcodone-acetaminophen (NORCO/VICODIN) 5-325 MG tablet    Sig: Take 1 tablet by mouth 2 (two) times daily as needed for moderate pain. For chronic pain syndrome. To last 30 days    Dispense:  60 tablet    Refill:  0  . HYDROcodone-acetaminophen (NORCO/VICODIN) 5-325 MG tablet    Sig: Take 1 tablet by mouth 2 (two) times daily as needed for moderate pain. For chronic pain syndrome. To last 30 days    Dispense:  60 tablet    Refill:  0   Continue gabapentin as prescribed.  Follow-up plan:   Return in about 9 weeks (around 09/05/2020) for Medication Management, in person.          Recent Visits Date Type Provider Dept  05/25/20 Office Visit Gillis Santa, MD [facility]  05/01/20 Procedure visit Gillis Santa, MD [facility]  04/05/20 Telemedicine Gillis Santa, MD [facility]  Showing recent visits within past 90 days and meeting all other requirements Today's Visits Date Type Provider Dept  07/04/20 Office Visit Gillis Santa, MD [facility]  Showing today's visits and meeting all other requirements Future Appointments Date Type Provider Dept  09/05/20 Appointment Gillis Santa, MD [facility]  Showing future appointments within next 90 days and meeting all other requirements  I discussed the assessment and treatment plan with the patient. The patient was provided an opportunity to ask questions and all were answered. The patient agreed with the plan and demonstrated an understanding of the  instructions.  Patient advised to call back or seek an in-person evaluation if the symptoms or condition worsens.  Duration of encounter: 30 minutes.  Note by: Gillis Santa, MD Date: 07/04/2020; Time: 11:18 AM

## 2020-07-04 NOTE — Patient Instructions (Signed)
Two prescriptions for Hydrocodone have been sent to your pharmacy.

## 2020-07-25 DIAGNOSIS — E113511 Type 2 diabetes mellitus with proliferative diabetic retinopathy with macular edema, right eye: Secondary | ICD-10-CM | POA: Diagnosis not present

## 2020-07-27 ENCOUNTER — Other Ambulatory Visit: Payer: Self-pay | Admitting: Family Medicine

## 2020-07-27 DIAGNOSIS — E7800 Pure hypercholesterolemia, unspecified: Secondary | ICD-10-CM

## 2020-08-07 DIAGNOSIS — E113512 Type 2 diabetes mellitus with proliferative diabetic retinopathy with macular edema, left eye: Secondary | ICD-10-CM | POA: Diagnosis not present

## 2020-08-11 ENCOUNTER — Other Ambulatory Visit: Payer: Self-pay | Admitting: Family Medicine

## 2020-08-11 DIAGNOSIS — I10 Essential (primary) hypertension: Secondary | ICD-10-CM

## 2020-08-21 DIAGNOSIS — E113512 Type 2 diabetes mellitus with proliferative diabetic retinopathy with macular edema, left eye: Secondary | ICD-10-CM | POA: Diagnosis not present

## 2020-08-24 ENCOUNTER — Encounter: Payer: PPO | Admitting: Student in an Organized Health Care Education/Training Program

## 2020-09-05 ENCOUNTER — Other Ambulatory Visit: Payer: Self-pay

## 2020-09-05 ENCOUNTER — Encounter: Payer: Self-pay | Admitting: Student in an Organized Health Care Education/Training Program

## 2020-09-05 ENCOUNTER — Ambulatory Visit
Payer: PPO | Attending: Student in an Organized Health Care Education/Training Program | Admitting: Student in an Organized Health Care Education/Training Program

## 2020-09-05 VITALS — BP 158/88 | HR 84 | Temp 98.1°F | Resp 18 | Ht 74.0 in | Wt 182.0 lb

## 2020-09-05 DIAGNOSIS — G8929 Other chronic pain: Secondary | ICD-10-CM | POA: Diagnosis not present

## 2020-09-05 DIAGNOSIS — M170 Bilateral primary osteoarthritis of knee: Secondary | ICD-10-CM | POA: Insufficient documentation

## 2020-09-05 DIAGNOSIS — E1142 Type 2 diabetes mellitus with diabetic polyneuropathy: Secondary | ICD-10-CM | POA: Diagnosis not present

## 2020-09-05 DIAGNOSIS — M47816 Spondylosis without myelopathy or radiculopathy, lumbar region: Secondary | ICD-10-CM | POA: Diagnosis not present

## 2020-09-05 DIAGNOSIS — M25512 Pain in left shoulder: Secondary | ICD-10-CM | POA: Insufficient documentation

## 2020-09-05 DIAGNOSIS — M19012 Primary osteoarthritis, left shoulder: Secondary | ICD-10-CM | POA: Diagnosis not present

## 2020-09-05 DIAGNOSIS — G894 Chronic pain syndrome: Secondary | ICD-10-CM | POA: Diagnosis not present

## 2020-09-05 DIAGNOSIS — M65311 Trigger thumb, right thumb: Secondary | ICD-10-CM | POA: Diagnosis not present

## 2020-09-05 DIAGNOSIS — G5601 Carpal tunnel syndrome, right upper limb: Secondary | ICD-10-CM | POA: Insufficient documentation

## 2020-09-05 MED ORDER — HYDROCODONE-ACETAMINOPHEN 5-325 MG PO TABS: 1.0000 | ORAL_TABLET | Freq: Two times a day (BID) | ORAL | 0 refills | Status: DC | PRN

## 2020-09-05 MED ORDER — GABAPENTIN 400 MG PO CAPS: 1200.0000 mg | ORAL_CAPSULE | Freq: Every day | ORAL | 2 refills | Status: DC

## 2020-09-05 NOTE — Progress Notes (Signed)
Nursing Pain Medication Assessment:  Safety precautions to be maintained throughout the outpatient stay will include: orient to surroundings, keep bed in low position, maintain call bell within reach at all times, provide assistance with transfer out of bed and ambulation.  Medication Inspection Compliance: Gregory Harrison did not comply with our request to bring his pills to be counted. He was reminded that bringing the medication bottles, even when empty, is a requirement.  Medication: None brought in. Pill/Patch Count: None available to be counted. Bottle Appearance: No container available. Did not bring bottle(s) to appointment. Filled Date: N/A Last Medication intake:  Yesterday  

## 2020-09-05 NOTE — Progress Notes (Signed)
PROVIDER NOTE: Information contained herein reflects review and annotations entered in association with encounter. Interpretation of such information and data should be left to medically-trained personnel. Information provided to patient can be located elsewhere in the medical record under "Patient Instructions". Document created using STT-dictation technology, any transcriptional errors that may result from process are unintentional.    Patient: Gregory Harrison  Service Category: E/M  Provider: Gillis Santa, MD  DOB: [birth year]  DOS: 09/05/2020  Specialty: Interventional Pain Management  MRN: [id number]  Setting: Ambulatory outpatient  PCP: [name], MD  Type: Established Patient    Referring Provider: [name], MD  Location: Office  Delivery: Face-to-face     HPI  Reason for encounter: [name], a 69 y.o. year old male, is here today for evaluation and management of his Chronic pain syndrome [G89.4]. [name] primary complain today is Hand Pain and Knee Pain Last encounter: Practice (07/04/2020). My last encounter with him was on 07/04/2020. Pertinent problems: [name] has Diabetes [facility]); Chronic left shoulder pain; Carpal tunnel syndrome of right wrist; Trigger finger of right thumb; Lumbar facet arthropathy; Lumbar degenerative disc disease; Bilateral primary osteoarthritis of knee; Primary osteoarthritis of left shoulder; and Trigger finger, right little finger on their pertinent problem list. Pain Assessment: Severity of Chronic pain is reported as a 8 /10. Location: Hand Right, Left/denies. Onset: More than a month ago. Quality: Aching. Timing: Constant. Modifying factor(s): pain medication. Vitals:  height is _0  (1.88 m) and weight is 182 lb (82.6 kg). His temperature is 98.1 F (36.7 C). His blood pressure is 158/88 (abnormal) and his pulse is 84. His respiration is 18 and oxygen saturation is 99%.   Patient follows up today for medication management.  No significant  change in his medical history.  He is endorsing chronic cough that has been persistent for the last 2 weeks.  Denies any fevers, body aches, chills, nausea.  I encouraged the patient to follow-up with his primary care provider to discuss his symptoms and get Covid tested.  Otherwise patient is here to refill his hydrocodone which he takes 5 mg twice a day as needed depending upon his activity level.  He also takes gabapentin 1200 mg nightly for diabetic neuropathy.  We will refill that as below.  Patient will follow up in 3 months.  Pharmacotherapy Assessment   08/16/2020  1   07/04/2020  Hydrocodone-Acetamin 5-325 MG  60.00  30 Bi Lat   [id number]   Nor (2372)   0/0  10.00 MME  Medicare   [place]     Analgesic: Hydrocodone 5 mg twice daily as needed, quantity 60/month MME equals 10    Monitoring:  PMP: PDMP reviewed during this encounter.       Pharmacotherapy: No side-effects or adverse reactions reported. Compliance: No problems identified. Effectiveness: Clinically acceptable.  Dewayne Shorter, RN  09/05/2020  8:11 AM  Signed Nursing Pain Medication Assessment:  Safety precautions to be maintained throughout the outpatient stay will include: orient to surroundings, keep bed in low position, maintain call bell within reach at all times, provide assistance with transfer out of bed and ambulation.  Medication Inspection Compliance: Mr. Ellena did not comply with our request to bring his pills to be counted. He was reminded that bringing the medication bottles, even when empty, is a requirement.  Medication: None brought in. Pill/Patch Count: None available to be counted. Bottle Appearance: No container available. Did not bring bottle(s) to appointment. Filled Date: N/A  Last Medication intake:  Yesterday    UDS:  Summary  Date Value Ref Range Status  10/25/2019 Note  Final    Comment:    ==================================================================== Compliance Drug Analysis,  Ur ==================================================================== Test                             Result       Flag       Units Drug Present and Declared for Prescription Verification   Gabapentin                     PRESENT      EXPECTED   Acetaminophen                  PRESENT      EXPECTED Drug Absent but Declared for Prescription Verification   Hydrocodone                    Not Detected UNEXPECTED ng/mg creat   Tramadol                       Not Detected UNEXPECTED ng/mg creat   Diclofenac                     Not Detected UNEXPECTED    Diclofenac, as indicated in the declared medication list, is not    always detected even when used as directed. ==================================================================== Test                      Result    Flag   Units      Ref Range   Creatinine              33               mg/dL      >=20 ==================================================================== Declared Medications:  The flagging and interpretation on this report are based on the  following declared medications.  Unexpected results may arise from  inaccuracies in the declared medications.  **Note: The testing scope of this panel includes these medications:  Gabapentin (Neurontin)  Hydrocodone (Norco)  Tramadol  **Note: The testing scope of this panel does not include small to  moderate amounts of these reported medications:  Acetaminophen (Norco)  Diclofenac (Voltaren)  **Note: The testing scope of this panel does not include the  following reported medications:  Atorvastatin (Lipitor)  Hydrochlorothiazide (Hydrodiuril)  Losartan (Cozaar)  Meloxicam (Mobic)  Metformin  Polyethylene Glycol  Topical ==================================================================== For clinical consultation, please call [phone]. ====================================================================      ROS  Constitutional: Denies any fever or chills cough for 2  weeks Gastrointestinal: No reported hemesis, hematochezia, vomiting, or acute GI distress Musculoskeletal: Denies any acute onset joint swelling, redness, loss of ROM, or weakness Neurological: No reported episodes of acute onset apraxia, aphasia, dysarthria, agnosia, amnesia, paralysis, loss of coordination, or loss of consciousness  Medication Review  Alcohol Swabs, Basaglar KwikPen, HYDROcodone-acetaminophen, ONE TOUCH DELICA LANCING DEV, OneTouch Delica Lancets Fine, OneTouch Verio, atorvastatin, empagliflozin, gabapentin, glucose blood, hydrochlorothiazide, losartan, meloxicam, and metFORMIN  History Review  Allergy: Mr. [place] has No Known Allergies. Drug: Mr. Takeshita  reports previous drug use. Alcohol:  reports no history of alcohol use. Tobacco:  reports that he has quit smoking. He has a 40.00 pack-year smoking history. He has never used smokeless tobacco. Social: Mr. Christoffel  reports that he has  quit smoking. He has a 40.00 pack-year smoking history. He has never used smokeless tobacco. He reports previous drug use. He reports that he does not drink alcohol. Medical:  has a past medical history of Arthritis, Diabetes ([place]) (2000), Diabetic neuropathy ([place]), GERD (gastroesophageal reflux disease), HTN (hypertension) (2010), Hypercholesterolemia, and Vision problem. Surgical: Mr. Bond  has a past surgical history that includes Hand surgery (1980s); Nose surgery (1979); and Eye surgery (Left, 1990s). Family: family history includes ALS in his brother; Diabetes in his paternal grandfather; Heart attack in his father; Heart murmur in his father; Other in his mother.  Laboratory Chemistry Profile   Renal Lab Results  Component Value Date   BUN 14 07/29/2019   CREATININE 0.87 07/29/2019   BCR 16 07/29/2019   GFRAA 101 07/29/2019   GFRNONAA 87 07/29/2019     Hepatic Lab Results  Component Value Date   AST 16 07/15/2019   ALT 20 07/15/2019   ALBUMIN 4.9 (H) 07/15/2019   ALKPHOS  64 07/15/2019     Electrolytes Lab Results  Component Value Date   NA 141 07/29/2019   K 4.5 07/29/2019   CL 103 07/29/2019   CALCIUM 9.8 07/29/2019     Bone No results found for: VD25OH, VD125OH2TOT, FM7340ZJ0, DU4383KF8, 25OHVITD1, 25OHVITD2, 25OHVITD3, TESTOFREE, TESTOSTERONE   Inflammation (CRP: Acute Phase) (ESR: Chronic Phase) No results found for: CRP, ESRSEDRATE, LATICACIDVEN     Note: Above Lab results reviewed.   Physical Exam  General appearance: Well nourished, well developed, and well hydrated. In no apparent acute distress Mental status: Alert, oriented x 3 (person, place, & time)       Respiratory: No evidence of acute respiratory distress Eyes: PERLA Vitals: BP (!) 158/88    Pulse 84    Temp 98.1 F (36.7 C)    Resp 18    Ht _0  (1.88 m)    Wt 182 lb (82.6 kg)    SpO2 99%    BMI 23.37 kg/m  BMI: Estimated body mass index is 23.37 kg/m as calculated from the following:   Height as of this encounter: _1  (1.88 m).   Weight as of this encounter: 182 lb (82.6 kg). Ideal: Ideal body weight: 82.2 kg (181 lb 3.5 oz) Adjusted ideal body weight: 82.3 kg (181 lb 8.5 oz)    Cervical Spine Area Exam  Skin & Axial Inspection: No masses, redness, edema, swelling, or associated skin lesions Alignment: Symmetrical Functional ROM: Decreased ROM      Stability: No instability detected Muscle Tone/Strength: Functionally intact. No obvious neuro-muscular anomalies detected. Sensory (Neurological): Musculoskeletal pain pattern Palpation: No palpable anomalies               Upper Extremity (UE) Exam    Side: Right upper extremity  Side: Left upper extremity  Skin & Extremity Inspection: Skin color, temperature, and hair growth are WNL. No peripheral edema or cyanosis. No masses, redness, swelling, asymmetry, or associated skin lesions. No contractures.  Skin & Extremity Inspection: Skin color, temperature, and hair growth are WNL. No peripheral edema or cyanosis. No  masses, redness, swelling, asymmetry, or associated skin lesions. No contractures.  Functional ROM: Unrestricted ROM          Functional ROM: Pain restricted ROM for shoulder  Muscle Tone/Strength: Functionally intact. No obvious neuro-muscular anomalies detected.  Muscle Tone/Strength: Functionally intact. No obvious neuro-muscular anomalies detected.  Sensory (Neurological): Unimpaired          Sensory (Neurological): Musculoskeletal pain pattern  Palpation: No palpable anomalies              Palpation: No palpable anomalies              Provocative Test(s):  Phalen's test: deferred Tinel's test: deferred Apley's scratch test (touch opposite shoulder):  Action 1 (Across chest): deferred Action 2 (Overhead): deferred Action 3 (LB reach): deferred   Provocative Test(s):  Phalen's test: deferred Tinel's test: deferred Apley's scratch test (touch opposite shoulder):  Action 1 (Across chest): Decreased ROM Action 2 (Overhead): Decreased ROM Action 3 (LB reach): Decreased ROM     Lumbar Spine Area Exam  Skin & Axial Inspection: No masses, redness, or swelling Alignment: Symmetrical Functional ROM: Pain restricted ROM       Stability: No instability detected Muscle Tone/Strength: Functionally intact. No obvious neuro-muscular anomalies detected. Sensory (Neurological): Musculoskeletal pain pattern  Lumbar Spine Area Exam  Skin & Axial Inspection: No masses, redness, or swelling Alignment: Symmetrical Functional ROM: Unrestricted ROM       Stability: No instability detected Muscle Tone/Strength: Functionally intact. No obvious neuro-muscular anomalies detected. Sensory (Neurological): Musculoskeletal pain pattern   Gait & Posture Assessment  Ambulation: Unassisted Gait: Relatively normal for age and body habitus Posture: WNL     Assessment   Status Diagnosis  Controlled Controlled Controlled 1. Chronic pain syndrome   2. Type 2 diabetes mellitus with  diabetic polyneuropathy, without long-term current use of insulin ([place])   3. Chronic left shoulder pain   4. Primary osteoarthritis of left shoulder   5. Diabetic polyneuropathy associated with type 2 diabetes mellitus (HCC)   6. Carpal tunnel syndrome of right wrist   7. Trigger finger of right thumb   8. Pain in joint of left shoulder   9. Lumbar facet arthropathy   10. Bilateral primary osteoarthritis of knee      Plan of Care   Mr. DALLAS TOROK has a current medication list which includes the following long-term medication(s): atorvastatin, gabapentin, hydrochlorothiazide, [START ON 09/14/2020] hydrocodone-acetaminophen, [START ON 10/14/2020] hydrocodone-acetaminophen, [START ON 11/13/2020] hydrocodone-acetaminophen, basaglar kwikpen, losartan, metformin, [DISCONTINUED] atorvastatin, and [DISCONTINUED] gabapentin.  Pharmacotherapy (Medications Ordered): Meds ordered this encounter  Medications   HYDROcodone-acetaminophen (NORCO/VICODIN) 5-325 MG tablet    Sig: Take 1 tablet by mouth 2 (two) times daily as needed for moderate pain. For chronic pain syndrome    Dispense:  60 tablet    Refill:  0   HYDROcodone-acetaminophen (NORCO/VICODIN) 5-325 MG tablet    Sig: Take 1 tablet by mouth 2 (two) times daily as needed for moderate pain. For chronic pain syndrome    Dispense:  60 tablet    Refill:  0   HYDROcodone-acetaminophen (NORCO/VICODIN) 5-325 MG tablet    Sig: Take 1 tablet by mouth 2 (two) times daily as needed for moderate pain. For chronic pain syndrome    Dispense:  60 tablet    Refill:  0   gabapentin (NEURONTIN) 400 MG capsule    Sig: Take 3 capsules (1,200 mg total) by mouth at bedtime.    Dispense:  270 capsule    Refill:  2   Follow-up plan:   Return in about 3 months (around 12/05/2020) for Medication Management, in person.   Recent Visits Date Type Provider Dept  07/04/20 Office Visit Gillis Santa, MD [facility]  Showing recent visits within past  90 days and meeting all other requirements Today's Visits Date Type Provider Dept  09/05/20 Office Visit Gillis Santa, MD [facility]  Showing today's visits and meeting all other requirements Future Appointments Date Type Provider Dept  11/30/20 Appointment Gillis Santa, MD [facility]  Showing future appointments within next 90 days and meeting all other requirements  I discussed the assessment and treatment plan with the patient. The patient was provided an opportunity to ask questions and all were answered. The patient agreed with the plan and demonstrated an understanding of the instructions.  Patient advised to call back or seek an in-person evaluation if the symptoms or condition worsens.  Duration of encounter: 30 minutes.  Note by: Gillis Santa, MD Date: 09/05/2020; Time: 8:28 AM

## 2020-09-12 ENCOUNTER — Ambulatory Visit: Payer: PPO

## 2020-09-13 ENCOUNTER — Other Ambulatory Visit: Payer: Self-pay

## 2020-09-13 ENCOUNTER — Ambulatory Visit (INDEPENDENT_AMBULATORY_CARE_PROVIDER_SITE_OTHER): Payer: PPO | Admitting: Family Medicine

## 2020-09-13 ENCOUNTER — Encounter: Payer: Self-pay | Admitting: Family Medicine

## 2020-09-13 DIAGNOSIS — J208 Acute bronchitis due to other specified organisms: Secondary | ICD-10-CM | POA: Insufficient documentation

## 2020-09-13 MED ORDER — GUAIFENESIN-DM 100-10 MG/5ML PO SYRP: 10.0000 mL | ORAL_SOLUTION | ORAL | 0 refills | Status: DC | PRN

## 2020-09-13 NOTE — Progress Notes (Signed)
    SUBJECTIVE:   CHIEF COMPLAINT / HPI: persistent cough   Patient reports that he has had a persistent cough for going on three weeks now. He reports that a few members in his family had cold symptoms including cough and sore throat. Not sure of any COVID positive members as they did not get tested. Patient states that he has tried lozenges, NyQuil and Dayquil with no changes in symptoms. He states that he adds honey to his coffee daily. His sore throat does not bother him much any more. He states that he smoked for 20 years and quit smoking in the 90s.  Patient denies SOB,reports some chest tightness, no HA, no sneezing, intermittent watery eyes (chronic), no hemoptysis, some white sputum occasionally.    PERTINENT  PMH / PSH:  HTN  DM  OBJECTIVE:   BP 124/74   Pulse 74   SpO2 98%    General: Male appearing stated age in no acute distress, not ill-appearing HEENT: MMM, no oral lesions noted,Neck non-tender without lymphadenopathy, no oropharyngeal erythema or exudate Cardio: Bilateral basilar coarse crackles, faint rhonchi, normal respiratory effort, intermittent dry cough, stable on room air, no evidence of digital clubbing  ASSESSMENT/PLAN:   Acute viral bronchitis Symtpoms and persistance of couhg consistent with acute bronchitis. Low suscpicion for covid given patient's normal pulse ox. Patient's history of sick contacts also more likely for viral origin. Cough is premdominant symptom. Ptaient has distant smoking history but did smoke for 20 years in the past.  - RX robitussin dm  - recommended to continue throat lozenges, hot tea, honey - Xrays, will follow up with patient once available        Eulis Foster, MD [place]

## 2020-09-13 NOTE — Patient Instructions (Signed)
It was a pleasure to see you today!  Thank you for choosing [facility] for your primary care.  Gregory Harrison was seen for cough and sore throat.   Our plans for today were:  Please go to [place] for a chest xray. You do not need an appointment, you can just walk in before 5PM Mon-Friday.   In the meantime, I have prescribed a cough syrup for you to take to help with your cough.   Please drink 6-8 8 oz glasses of water per day as well as drinking warm tea with a small amount of honey to help with cough. I also recommend using throat lozenges and drinking warm soups.   I will call you if the chest xray results show anything that we need to change management for your cough.   To keep you healthy, please keep in mind the following health maintenance items that you are due for:   1. Hemoglobin A1c  2. Tetanus Vaccine   You should return to our clinic in 2-3 weeks for follow up.   Best Wishes,   Dr. Alba Cory   '  Acute Bronchitis, Adult  Acute bronchitis is when air tubes in the lungs (bronchi) suddenly get swollen. The condition can make it hard for you to breathe. In adults, acute bronchitis usually goes away within 2 weeks. A cough caused by bronchitis may last up to 3 weeks. Smoking, allergies, and asthma can make the condition worse. What are the causes? This condition is caused by:  Cold and flu viruses. The most common cause of this condition is the virus that causes the common cold.  Bacteria.  Substances that irritate the lungs, including: ? Smoke from cigarettes and other types of tobacco. ? Dust and pollen. ? Fumes from chemicals, gases, or burned fuel. ? Other materials that pollute indoor or outdoor air.  Close contact with someone who has acute bronchitis. What increases the risk? The following factors may make you more likely to develop this condition:  A weak body's defense system. This is also called the immune system.  Any  condition that affects your lungs and breathing, such as asthma. What are the signs or symptoms? Symptoms of this condition include:  A cough.  Coughing up clear, yellow, or green mucus.  Wheezing.  Chest congestion.  Shortness of breath.  A fever.  Body aches.  Chills.  A sore throat. How is this treated? Acute bronchitis may go away over time without treatment. Your doctor may recommend:  Drinking more fluids.  Taking a medicine for a fever or cough.  Using a device that gets medicine into your lungs (inhaler).  Using a vaporizer or a humidifier. These are machines that add water or moisture in the air to help with coughing and poor breathing. Follow these instructions at home:  Activity  Get a lot of rest.  Avoid places where there are fumes from chemicals.  Return to your normal activities as told by your doctor. Ask your doctor what activities are safe for you. Lifestyle  Drink enough fluids to keep your pee (urine) pale yellow.  Do not drink alcohol.  Do not use any products that contain nicotine or tobacco, such as cigarettes, e-cigarettes, and chewing tobacco. If you need help quitting, ask your doctor. Be aware that: ? Your bronchitis will get worse if you smoke or breathe in other people's smoke (secondhand smoke). ? Your lungs will heal faster if you quit smoking. General instructions  Take over-the-counter and prescription medicines only as told by your doctor.  Use an inhaler, cool mist vaporizer, or humidifier as told by your doctor.  Rinse your mouth often with salt water. To make salt water, dissolve -1 tsp (3-6 g) of salt in 1 cup (237 mL) of warm water.  Keep all follow-up visits as told by your doctor. This is important. How is this prevented? To lower your risk of getting this condition again:  Wash your hands often with soap and water. If soap and water are not available, use hand sanitizer.  Avoid contact with people who have cold  symptoms.  Try not to touch your mouth, nose, or eyes with your hands.  Make sure to get the flu shot every year. Contact a doctor if:  Your symptoms do not get better in 2 weeks.  You vomit more than once or twice.  You have symptoms of loss of fluid from your body (dehydration). These include: ? Dark urine. ? Dry skin or eyes. ? Increased thirst. ? Headaches. ? Confusion. ? Muscle cramps. Get help right away if:  You cough up blood.  You have chest pain.  You have very bad shortness of breath.  You become dehydrated.  You faint or keep feeling like you are going to faint.  You keep vomiting.  You have a very bad headache.  Your fever or chills get worse. These symptoms may be an emergency. Do not wait to see if the symptoms will go away. Get medical help right away. Call your local emergency services (911 in the [place] Do not drive yourself to the hospital. Summary  Acute bronchitis is when air tubes in the lungs (bronchi) suddenly get swollen. In adults, acute bronchitis usually goes away within 2 weeks.  Take over-the-counter and prescription medicines only as told by your doctor.  Drink enough fluid to keep your pee (urine) pale yellow.  Contact a doctor if your symptoms do not improve after 2 weeks of treatment.  Get help right away if you cough up blood, faint, or have chest pain or shortness of breath. This information is not intended to replace advice given to you by your health care provider. Make sure you discuss any questions you have with your health care provider. Document Revised: 06/18/2019 Document Reviewed: 06/18/2019 Elsevier Patient Education  [place].

## 2020-09-13 NOTE — Assessment & Plan Note (Addendum)
Symtpoms and persistance of couhg consistent with acute bronchitis. Low suscpicion for covid given patient's normal pulse ox. Patient's history of sick contacts also more likely for viral origin. Cough is premdominant symptom. Ptaient has distant smoking history but did smoke for 20 years in the past.  - RX robitussin dm  - recommended to continue throat lozenges, hot tea, honey - Xrays, will follow up with patient once available

## 2020-09-19 ENCOUNTER — Other Ambulatory Visit: Payer: Self-pay

## 2020-09-19 ENCOUNTER — Ambulatory Visit
Admission: RE | Admit: 2020-09-19 | Discharge: 2020-09-19 | Disposition: A | Discharge status: Home / Self Care | Payer: PPO | Source: Ambulatory Visit | Attending: Family Medicine | Admitting: Family Medicine

## 2020-09-19 ENCOUNTER — Other Ambulatory Visit: Payer: Self-pay | Admitting: Family Medicine

## 2020-09-19 DIAGNOSIS — E7800 Pure hypercholesterolemia, unspecified: Secondary | ICD-10-CM

## 2020-09-19 DIAGNOSIS — E1142 Type 2 diabetes mellitus with diabetic polyneuropathy: Secondary | ICD-10-CM

## 2020-09-19 DIAGNOSIS — M65311 Trigger thumb, right thumb: Secondary | ICD-10-CM

## 2020-09-19 DIAGNOSIS — J208 Acute bronchitis due to other specified organisms: Secondary | ICD-10-CM

## 2020-09-20 ENCOUNTER — Encounter: Payer: Self-pay | Admitting: Family Medicine

## 2020-09-20 NOTE — Progress Notes (Signed)
Chest xray results normal. Will send letter to patient for his records.

## 2020-09-27 ENCOUNTER — Telehealth: Payer: Self-pay | Admitting: Family Medicine

## 2020-09-27 NOTE — Telephone Encounter (Signed)
Called patient to discuss results of MRI.  LVM for patient to return call.  Chart review  I do not see any MRI that was ordered.  Last imaging was a chest xray 09/19/20 that was negative for any active cardiopulmonary disease but did show some mild to moderate scoliosis.  I will try to reconnect with him tomorrow during clinic.  [name], MD [facility]

## 2020-09-27 NOTE — Telephone Encounter (Signed)
Patient returns call to nurse line and informed of below.   Nare Gaspari C Datrell Dunton, RN  

## 2020-09-27 NOTE — Telephone Encounter (Signed)
Patient is calling and would like Dr. Volanda Napoleon to call him with the results from his last MRI.   The best call back number is [phone].

## 2020-09-27 NOTE — Telephone Encounter (Signed)
Returned patient call and LVM.  Plan to call patient between 730-8am tomorrow to discuss concerns.  If no answer in am patient can call clinic.  [name], MD [facility]

## 2020-10-03 ENCOUNTER — Ambulatory Visit (INDEPENDENT_AMBULATORY_CARE_PROVIDER_SITE_OTHER): Payer: PPO

## 2020-10-03 ENCOUNTER — Other Ambulatory Visit: Payer: Self-pay

## 2020-10-03 DIAGNOSIS — Z23 Encounter for immunization: Secondary | ICD-10-CM | POA: Diagnosis not present

## 2020-10-03 NOTE — Progress Notes (Signed)
° °  Covid-19 Vaccination Clinic  Name:  Gregory Harrison    MRN: [id number] DOB: 03-09-1951  10/03/2020  [name] was observed post Covid-19 immunization for 15 minutes without incident. He was provided with Vaccine Information Sheet and instruction to access the V-Safe system.   [name] was instructed to call 911 with any severe reactions post vaccine:  Difficulty breathing   Swelling of face and throat   A fast heartbeat   A bad rash all over body   Dizziness and weakness   Covid Booster administered RD without complication.   Flu Vaccine administered LD without complication.

## 2020-10-11 ENCOUNTER — Other Ambulatory Visit: Payer: Self-pay | Admitting: Family Medicine

## 2020-10-11 DIAGNOSIS — E7800 Pure hypercholesterolemia, unspecified: Secondary | ICD-10-CM

## 2020-10-11 DIAGNOSIS — M65311 Trigger thumb, right thumb: Secondary | ICD-10-CM

## 2020-10-15 NOTE — Patient Instructions (Signed)
Thank you for coming to see me today. It was a pleasure.   You should pay attention to your hemoglobin A1C.  It is a three month test about your average blood sugar. If the A1C is - <7.0 is great.  That is our goal for treating you. - Between 7.0 and 9.0 is not so good.  We would need to work to do better. - Above 9.0 is terrible.  You would really need to work with [place] to get it under control.    Today's A1C = 9.9  Take insulin 30 units in the morning daily.  Please follow-up me in 4 weeks  If you have any questions or concerns, please do not hesitate to call the office at [phone].  Best,   [name], MD [facility]

## 2020-10-15 NOTE — Progress Notes (Signed)
    SUBJECTIVE:   CHIEF COMPLAINT / HPI:  DM check up and 2 month cough  Fasting checks: 200-300's Diet: well balanced Exercise: age appropriate Eye exam: 06/22/20- Retinopathy Foot exam: patient to schedule for next visit A1C: 9.9 Symptoms: No symptoms of hypoglycemia.  Denies symptoms of  polyuria, polydipsia. No worsening numbness in extremities, and no foot ulcers/trauma. Meds: Lantus 20u daily (chart review patient should be taking 30n), Jardiance 25mg , Metformin XR 1000mg  BID Pneumonia vaccine: 11/11   Increase sputum production Productive cough for at least 2 months.  Seen in clinic 10/06 for viral bronchitis.  Chest xray at that time negative for pneumonia.  Was treated with Robitussin which had some mild relief with cough.  Patient reports worsening yellow sputum production.  Denies any fevers, shortness of breath, chest pain or pain on inspiration.  Does endorse increasing runny nose.    Monitoring Labs and Parameters Last A1C:  Lab Results  Component Value Date   HGBA1C 9.9 (A) 10/19/2020   Last Lipid:     Component Value Date/Time   CHOL 178 11/02/2018 0919   HDL 41 11/02/2018 0919   LDLDIRECT 134 (H) 02/24/2018 1613   Last Bmet  Potassium  Date Value Ref Range Status  10/19/2020 4.3 3.5 - 5.2 mmol/L Final   Sodium  Date Value Ref Range Status  10/19/2020 140 134 - 144 mmol/L Final   Creatinine, Ser  Date Value Ref Range Status  10/19/2020 0.99 0.76 - 1.27 mg/dL Final      PERTINENT  PMH / PSH:  HTN DM Type 2  OBJECTIVE:   BP (!) 142/66   Pulse 73   Ht 6\' 2"  (1.88 m)   Wt 181 lb 6.4 oz (82.3 kg)   SpO2 96%   BMI 23.29 kg/m    General: Alert and oriented, no apparent distress  Eyes: PEERLAMetform ENTM: No pharyengeal erythema Neck: nontender Cardiovascular: RRR with no murmurs noted Respiratory: CTA bilaterally  Gastrointestinal: Bowel sounds present. No abdominal pain MSK: Upper extremity strength 5/5 bilaterally, Lower extremity  strength 5/5 bilaterally  ASSESSMENT/PLAN:   Diabetes (HCC) No hypoglycemic events.  CBG's 200-300's at home. Compliant with medications.  Taking Lantus 20 u daily and not the prescribed 30u -Increase Lantus to 30 u daily -Continue Jardiance and Metformin -Repeat HbA1c in 3 months -Bmet today -Foot exam at next visit -New glucose meter ordered   Post-viral cough syndrome Continued cough and worsening sputum production with increasing nasal drainage. No fevers, SOB or chest pain.  Chest xray from 10/06 negative for pneumonia.  Likely cough secondary from post viral bronchitis -Trial cetirizine 10 mg daily as patient not wanting to start nasal spray. -Strict return precautions provided      [name], MD [place]

## 2020-10-19 ENCOUNTER — Other Ambulatory Visit: Payer: Self-pay

## 2020-10-19 ENCOUNTER — Ambulatory Visit (INDEPENDENT_AMBULATORY_CARE_PROVIDER_SITE_OTHER): Payer: PPO | Admitting: Family Medicine

## 2020-10-19 ENCOUNTER — Encounter: Payer: Self-pay | Admitting: Family Medicine

## 2020-10-19 VITALS — BP 142/66 | HR 73 | Ht 74.0 in | Wt 181.4 lb

## 2020-10-19 DIAGNOSIS — E1142 Type 2 diabetes mellitus with diabetic polyneuropathy: Secondary | ICD-10-CM | POA: Diagnosis not present

## 2020-10-19 DIAGNOSIS — R058 Other specified cough: Secondary | ICD-10-CM | POA: Diagnosis not present

## 2020-10-19 DIAGNOSIS — Z23 Encounter for immunization: Secondary | ICD-10-CM | POA: Diagnosis not present

## 2020-10-19 LAB — POCT GLYCOSYLATED HEMOGLOBIN (HGB A1C): HbA1c, POC (controlled diabetic range): 9.9 % — AB (ref 0.0–7.0)

## 2020-10-19 MED ORDER — ONETOUCH VERIO FLEX SYSTEM W/DEVICE KIT: PACK | 0 refills | Status: DC

## 2020-10-19 MED ORDER — CETIRIZINE HCL 10 MG PO TABS: 10.0000 mg | ORAL_TABLET | Freq: Every day | ORAL | 11 refills | Status: DC

## 2020-10-19 MED ORDER — BLOOD GLUCOSE METER KIT: PACK | 0 refills | Status: AC

## 2020-10-20 ENCOUNTER — Encounter: Payer: Self-pay | Admitting: Family Medicine

## 2020-10-20 DIAGNOSIS — R058 Other specified cough: Secondary | ICD-10-CM | POA: Insufficient documentation

## 2020-10-20 HISTORY — DX: Other specified cough: R05.8

## 2020-10-20 LAB — BASIC METABOLIC PANEL
BUN/Creatinine Ratio: 17 (ref 10–24)
BUN: 17 mg/dL (ref 8–27)
CO2: 20 mmol/L (ref 20–29)
Calcium: 9.4 mg/dL (ref 8.6–10.2)
Chloride: 101 mmol/L (ref 96–106)
Creatinine, Ser: 0.99 mg/dL (ref 0.76–1.27)
GFR calc Af Amer: 88 mL/min/{1.73_m2} (ref >59)
GFR calc non Af Amer: 76 mL/min/{1.73_m2} (ref >59)
Glucose: 288 mg/dL — ABNORMAL HIGH (ref 65–99)
Potassium: 4.3 mmol/L (ref 3.5–5.2)
Sodium: 140 mmol/L (ref 134–144)

## 2020-10-20 NOTE — Assessment & Plan Note (Addendum)
No hypoglycemic events.  CBG's 200-300's at home. Compliant with medications.  Taking Lantus 20 u daily and not the prescribed 30u -Increase Lantus to 30 u daily -Continue Jardiance and Metformin -Repeat HbA1c in 3 months -Bmet today -Foot exam at next visit -New glucose meter ordered

## 2020-10-20 NOTE — Assessment & Plan Note (Signed)
Continued cough and worsening sputum production with increasing nasal drainage. No fevers, SOB or chest pain.  Chest xray from 10/06 negative for pneumonia.  Likely cough secondary from post viral bronchitis -Trial cetirizine 10 mg daily as patient not wanting to start nasal spray. -Strict return precautions provided

## 2020-10-26 ENCOUNTER — Telehealth: Payer: Self-pay | Admitting: *Deleted

## 2020-10-26 NOTE — Telephone Encounter (Signed)
Received fax from [facility]. that says to Send new rx for BD pen needles 36mm short needles.Nikoli Nasser Zimmerman Rumple, CMA

## 2020-10-27 ENCOUNTER — Other Ambulatory Visit: Payer: Self-pay | Admitting: Family Medicine

## 2020-10-27 MED ORDER — INSULIN PEN NEEDLE 31G X 8 MM MISC: 0 refills | Status: AC

## 2020-10-27 NOTE — Telephone Encounter (Signed)
Script sent  

## 2020-10-27 NOTE — Progress Notes (Signed)
Prescription for Fine needled BD 8 mm sent to requested pharmacy.  [name], MD [facility]

## 2020-11-05 ENCOUNTER — Other Ambulatory Visit: Payer: Self-pay | Admitting: Family Medicine

## 2020-11-05 DIAGNOSIS — M65311 Trigger thumb, right thumb: Secondary | ICD-10-CM

## 2020-11-20 NOTE — Progress Notes (Deleted)
    SUBJECTIVE:   CHIEF COMPLAINT / HPI: f/u cough  Cough  Foot Exam  PERTINENT  PMH / PSH: ***  OBJECTIVE:   There were no vitals taken for this visit.  ***  ASSESSMENT/PLAN:   No problem-specific Assessment & Plan notes found for this encounter.     [name], MD [place]

## 2020-11-21 ENCOUNTER — Ambulatory Visit: Payer: PPO | Admitting: Family Medicine

## 2020-11-25 ENCOUNTER — Other Ambulatory Visit: Payer: Self-pay | Admitting: Family Medicine

## 2020-11-29 ENCOUNTER — Other Ambulatory Visit: Payer: Self-pay | Admitting: Family Medicine

## 2020-11-29 DIAGNOSIS — E118 Type 2 diabetes mellitus with unspecified complications: Secondary | ICD-10-CM

## 2020-11-30 ENCOUNTER — Telehealth: Payer: Self-pay | Admitting: *Deleted

## 2020-11-30 ENCOUNTER — Encounter: Payer: Self-pay | Admitting: Student in an Organized Health Care Education/Training Program

## 2020-11-30 ENCOUNTER — Ambulatory Visit
Payer: PPO | Attending: Student in an Organized Health Care Education/Training Program | Admitting: Student in an Organized Health Care Education/Training Program

## 2020-11-30 ENCOUNTER — Other Ambulatory Visit: Payer: Self-pay

## 2020-11-30 VITALS — BP 186/94 | HR 58 | Temp 97.2°F | Resp 16 | Ht 74.5 in | Wt 176.0 lb

## 2020-11-30 DIAGNOSIS — M65311 Trigger thumb, right thumb: Secondary | ICD-10-CM | POA: Diagnosis not present

## 2020-11-30 DIAGNOSIS — M25512 Pain in left shoulder: Secondary | ICD-10-CM | POA: Diagnosis not present

## 2020-11-30 DIAGNOSIS — G5601 Carpal tunnel syndrome, right upper limb: Secondary | ICD-10-CM | POA: Insufficient documentation

## 2020-11-30 DIAGNOSIS — G894 Chronic pain syndrome: Secondary | ICD-10-CM | POA: Insufficient documentation

## 2020-11-30 DIAGNOSIS — E1142 Type 2 diabetes mellitus with diabetic polyneuropathy: Secondary | ICD-10-CM | POA: Insufficient documentation

## 2020-11-30 DIAGNOSIS — M19012 Primary osteoarthritis, left shoulder: Secondary | ICD-10-CM

## 2020-11-30 MED ORDER — GABAPENTIN 400 MG PO CAPS
1200.0000 mg | ORAL_CAPSULE | Freq: Every day | ORAL | 2 refills | Status: DC
Start: 2020-11-30 — End: 2021-02-08

## 2020-11-30 MED ORDER — HYDROCODONE-ACETAMINOPHEN 5-325 MG PO TABS: 1.0000 | ORAL_TABLET | Freq: Two times a day (BID) | ORAL | 0 refills | Status: DC | PRN

## 2020-11-30 NOTE — Telephone Encounter (Signed)
Fax received for pts One Touch Verio Strip stating that the Frequency needed to [facility].Brysan Mcevoy Zimmerman Rumple, CMA

## 2020-11-30 NOTE — Progress Notes (Signed)
PROVIDER NOTE: Information contained herein reflects review and annotations entered in association with encounter. Interpretation of such information and data should be left to medically-trained personnel. Information provided to patient can be located elsewhere in the medical record under "Patient Instructions". Document created using STT-dictation technology, any transcriptional errors that may result from process are unintentional.    Patient: Gregory Harrison  Service Category: E/M  Provider: Gillis Santa, MD  DOB: 1951-04-01  DOS: 11/30/2020  Specialty: Interventional Pain Management  MRN: [id number]  Setting: Ambulatory outpatient  PCP: [name], MD  Type: Established Patient    Referring Provider: [name], MD  Location: Office  Delivery: Face-to-face     HPI  [name], a 69 y.o. year old male, is here today because of his Type 2 diabetes mellitus with diabetic polyneuropathy, without long-term current use of insulin ([place]) [E11.42]. Mr. Pratte primary complain today is Back Pain and Knee Pain (left) Last encounter: My last encounter with him was on 09/05/2020. Pertinent problems: Gregory Harrison has Diabetes [facility]); Chronic left shoulder pain; Carpal tunnel syndrome of right wrist; Trigger finger of right thumb; Lumbar facet arthropathy; Lumbar degenerative disc disease; Bilateral primary osteoarthritis of knee; Primary osteoarthritis of left shoulder; and Trigger finger, right little finger on their pertinent problem list. Pain Assessment: Severity of Chronic pain is reported as a 9 /10. Location: Back Lower/denies. Onset: More than a month ago. Quality: Aching,Burning. Timing: Intermittent. Modifying factor(s): meds. Vitals:  height is 6' 2.5" (1.892 m) and weight is 176 lb (79.8 kg). His temporal temperature is 97.2 F (36.2 C) (abnormal). His blood pressure is 186/94 (abnormal) and his pulse is 58 (abnormal). His respiration is 16 and oxygen saturation is 99%.   Reason for encounter:  medication management.   No change in medical history since last visit.  Patient's pain is at baseline.  Patient continues multimodal pain regimen as prescribed.  States that it provides pain relief and improvement in functional status.   Pharmacotherapy Assessment   Analgesic: Hydrocodone 5 mg twice daily as needed, quantity 60/month MME equals 10    Monitoring: [place] PMP: PDMP reviewed during this encounter.       Pharmacotherapy: No side-effects or adverse reactions reported. Compliance: No problems identified. Effectiveness: Clinically acceptable.  Rise Patience, RN  11/30/2020  8:20 AM  Sign when Signing Visit Nursing Pain Medication Assessment:  Safety precautions to be maintained throughout the outpatient stay will include: orient to surroundings, keep bed in low position, maintain call bell within reach at all times, provide assistance with transfer out of bed and ambulation.  Medication Inspection Compliance: Pill count conducted under aseptic conditions, in front of the patient. Neither the pills nor the bottle was removed from the patient's sight at any time. Once count was completed pills were immediately returned to the patient in their original bottle.  Medication: Hydrocodone/APAP Pill/Patch Count: 14 of 60 pills remain Pill/Patch Appearance: Markings consistent with prescribed medication Bottle Appearance: Standard pharmacy container. Clearly labeled. Filled Date: 2 / 28 / 2021 Last Medication intake:  Today    UDS:  Summary  Date Value Ref Range Status  10/25/2019 Note  Final    Comment:    ==================================================================== Compliance Drug Analysis, Ur ==================================================================== Test                             Result       Flag       Units Drug  Present and Declared for Prescription Verification   Gabapentin                     PRESENT      EXPECTED   Acetaminophen                   PRESENT      EXPECTED Drug Absent but Declared for Prescription Verification   Hydrocodone                    Not Detected UNEXPECTED ng/mg creat   Tramadol                       Not Detected UNEXPECTED ng/mg creat   Diclofenac                     Not Detected UNEXPECTED    Diclofenac, as indicated in the declared medication list, is not    always detected even when used as directed. ==================================================================== Test                      Result    Flag   Units      Ref Range   Creatinine              33               mg/dL      >=20 ==================================================================== Declared Medications:  The flagging and interpretation on this report are based on the  following declared medications.  Unexpected results may arise from  inaccuracies in the declared medications.  **Note: The testing scope of this panel includes these medications:  Gabapentin (Neurontin)  Hydrocodone (Norco)  Tramadol  **Note: The testing scope of this panel does not include small to  moderate amounts of these reported medications:  Acetaminophen (Norco)  Diclofenac (Voltaren)  **Note: The testing scope of this panel does not include the  following reported medications:  Atorvastatin (Lipitor)  Hydrochlorothiazide (Hydrodiuril)  Losartan (Cozaar)  Meloxicam (Mobic)  Metformin  Polyethylene Glycol  Topical ==================================================================== For clinical consultation, please call [phone]. ====================================================================      ROS  Constitutional: Denies any fever or chills Gastrointestinal: No reported hemesis, hematochezia, vomiting, or acute GI distress Musculoskeletal: Denies any acute onset joint swelling, redness, loss of ROM, or weakness Neurological: No reported episodes of acute onset apraxia, aphasia, dysarthria, agnosia, amnesia, paralysis, loss of  coordination, or loss of consciousness  Medication Review  Alcohol Swabs, Basaglar KwikPen, HYDROcodone-acetaminophen, Insulin Pen Needle, ONE TOUCH DELICA LANCING DEV, OneTouch Delica Lancets Fine, OneTouch Verio, OneTouch Verio Reflect, atorvastatin, blood glucose meter kit and supplies, cetirizine, empagliflozin, gabapentin, glucose blood, guaiFENesin-dextromethorphan, hydrochlorothiazide, losartan, meloxicam, and metFORMIN  History Review  Allergy: Mr. Murakami has No Known Allergies. Drug: Mr. Hemenway  reports previous drug use. Alcohol:  reports no history of alcohol use. Tobacco:  reports that he has quit smoking. He has a 40.00 pack-year smoking history. He has never used smokeless tobacco. Social: Mr. Owczarzak  reports that he has quit smoking. He has a 40.00 pack-year smoking history. He has never used smokeless tobacco. He reports previous drug use. He reports that he does not drink alcohol. Medical:  has a past medical history of Arthritis, Diabetes ([place]) (2000), Diabetic neuropathy ([place]), GERD (gastroesophageal reflux disease), HTN (hypertension) (2010), Hypercholesterolemia, and Vision problem. Surgical: Mr. Likins  has a past surgical history that includes Hand surgery (1980s); Nose surgery (  1979); and Eye surgery (Left, 1990s). Family: family history includes ALS in his brother; Diabetes in his paternal grandfather; Heart attack in his father; Heart murmur in his father; Other in his mother.  Laboratory Chemistry Profile   Renal Lab Results  Component Value Date   BUN 17 10/19/2020   CREATININE 0.99 10/19/2020   BCR 17 10/19/2020   GFRAA 88 10/19/2020   GFRNONAA 76 10/19/2020     Hepatic Lab Results  Component Value Date   AST 16 07/15/2019   ALT 20 07/15/2019   ALBUMIN 4.9 (H) 07/15/2019   ALKPHOS 64 07/15/2019     Electrolytes Lab Results  Component Value Date   NA 140 10/19/2020   K 4.3 10/19/2020   CL 101 10/19/2020   CALCIUM 9.4 10/19/2020     Bone No  results found for: VD25OH, VD125OH2TOT, FB5102HE5, ID7824MP5, 25OHVITD1, 25OHVITD2, 25OHVITD3, TESTOFREE, TESTOSTERONE   Inflammation (CRP: Acute Phase) (ESR: Chronic Phase) No results found for: CRP, ESRSEDRATE, LATICACIDVEN     Note: Above Lab results reviewed.  Recent Imaging Review  DG Chest 2 View CLINICAL DATA:  Productive cough for 3 weeks.  EXAM: CHEST - 2 VIEW  COMPARISON:  Chest CTA 08/12/2007  FINDINGS: The cardiac silhouette is normal in size. Mild right hilar prominence is similar to the prior CTA and attributed to vasculature. Aortic atherosclerosis is noted. There is anterior eventration of the right hemidiaphragm. The lungs are well inflated and clear. No pleural effusion or pneumothorax is identified. There is mild to moderate S-shaped thoracic scoliosis.  IMPRESSION: No active cardiopulmonary disease.  Electronically Signed   By: Logan Bores M.D.   On: 09/20/2020 16:11 Note: Reviewed        Physical Exam  General appearance: Well nourished, well developed, and well hydrated. In no apparent acute distress Mental status: Alert, oriented x 3 (person, place, & time)       Respiratory: No evidence of acute respiratory distress Eyes: PERLA Vitals: BP (!) 186/94   Pulse (!) 58   Temp (!) 97.2 F (36.2 C) (Temporal)   Resp 16   Ht 6' 2.5" (1.892 m)   Wt 176 lb (79.8 kg)   SpO2 99%   BMI 22.29 kg/m  BMI: Estimated body mass index is 22.29 kg/m as calculated from the following:   Height as of this encounter: 6' 2.5" (1.892 m).   Weight as of this encounter: 176 lb (79.8 kg). Ideal: Ideal body weight: 83.4 kg (183 lb 12.1 oz)  Lumbar Spine Area Exam  Skin & Axial Inspection:No masses, redness, or swelling Alignment:Symmetrical Functional TIR:[id number] ROM Stability:No instability detected Muscle Tone/Strength:Functionally intact. No obvious neuro-muscular anomalies detected. Sensory (Neurological):Musculoskeletal pain pattern  Gait  & Posture Assessment  Ambulation:Unassisted Gait:Relatively normal for age and body habitus Posture:WNL     Assessment   Status Diagnosis  Controlled Controlled Controlled 1. Type 2 diabetes mellitus with diabetic polyneuropathy, without long-term current use of insulin ([place])   2. Primary osteoarthritis of left shoulder   3. Diabetic polyneuropathy associated with type 2 diabetes mellitus (HCC)   4. Carpal tunnel syndrome of right wrist   5. Trigger finger of right thumb   6. Pain in joint of left shoulder   7. Chronic pain syndrome       Plan of Care  Mr. CONRADO NANCE has a current medication list which includes the following long-term medication(s): atorvastatin, cetirizine, hydrochlorothiazide, basaglar kwikpen, losartan, metformin, gabapentin, [START ON 01/09/2021] hydrocodone-acetaminophen, [DISCONTINUED] atorvastatin, and [DISCONTINUED] gabapentin.  Pharmacotherapy (  Medications Ordered): Meds ordered this encounter  Medications  . HYDROcodone-acetaminophen (NORCO/VICODIN) 5-325 MG tablet    Sig: Take 1 tablet by mouth 2 (two) times daily as needed for moderate pain. For chronic pain syndrome    Dispense:  60 tablet    Refill:  0  . gabapentin (NEURONTIN) 400 MG capsule    Sig: Take 3 capsules (1,200 mg total) by mouth at bedtime.    Dispense:  270 capsule    Refill:  2   Follow-up plan:   Return in about 2 months (around 02/13/2021) for Medication Management, in person.   Recent Visits Date Type Provider Dept  09/05/20 Office Visit Gillis Santa, MD [facility]  Showing recent visits within past 90 days and meeting all other requirements Today's Visits Date Type Provider Dept  11/30/20 Office Visit Gillis Santa, MD [facility]  Showing today's visits and meeting all other requirements Future Appointments Date Type Provider Dept  02/08/21 Appointment Gillis Santa, MD [facility]  Showing future appointments within next 90 days  and meeting all other requirements  I discussed the assessment and treatment plan with the patient. The patient was provided an opportunity to ask questions and all were answered. The patient agreed with the plan and demonstrated an understanding of the instructions.  Patient advised to call back or seek an in-person evaluation if the symptoms or condition worsens.  Duration of encounter:30 minutes.  Note by: Gillis Santa, MD Date: 11/30/2020; Time: 8:49 AM

## 2020-11-30 NOTE — Progress Notes (Signed)
Nursing Pain Medication Assessment:  Safety precautions to be maintained throughout the outpatient stay will include: orient to surroundings, keep bed in low position, maintain call bell within reach at all times, provide assistance with transfer out of bed and ambulation.  Medication Inspection Compliance: Pill count conducted under aseptic conditions, in front of the patient. Neither the pills nor the bottle was removed from the patient's sight at any time. Once count was completed pills were immediately returned to the patient in their original bottle.  Medication: Hydrocodone/APAP Pill/Patch Count: 14 of 60 pills remain Pill/Patch Appearance: Markings consistent with prescribed medication Bottle Appearance: Standard pharmacy container. Clearly labeled. Filled Date: 31 / 28 / 2021 Last Medication intake:  Today

## 2020-12-05 ENCOUNTER — Other Ambulatory Visit: Payer: Self-pay | Admitting: Family Medicine

## 2020-12-05 NOTE — Telephone Encounter (Signed)
Check blood glucose twice a day.  Thanks  Kenney Houseman

## 2020-12-06 NOTE — Telephone Encounter (Signed)
Contacted pharmacy and spoke to [place] and gave him a verbal of the instructions listed below.Gregory Harrison, CMA

## 2020-12-15 ENCOUNTER — Ambulatory Visit: Payer: PPO | Admitting: Family Medicine

## 2020-12-15 NOTE — Progress Notes (Deleted)
    SUBJECTIVE:   CHIEF COMPLAINT / HPI:   Presents for follow up for **. Seen in clinic on 11/11 and treated with Cetirizine.  Since then patient reports improvement in symptoms **. Associated symptoms include **.   PERTINENT  PMH / PSH:  DM Type 2 HTN HLD  OBJECTIVE:   There were no vitals taken for this visit.   General: Alert, no acute distress Cardio: Normal S1 and S2, RRR, no r/m/g Pulm: CTAB, normal work of breathing Abdomen: Bowel sounds normal. Abdomen soft and non-tender.  Extremities: No peripheral edema.  Neuro: Cranial nerves grossly intact Diabetic foot exam: No deformities, ulcerations, or other skin breakdown on feet bilaterally.  Sensation intact to monofilament and light touch.  PT and DP pulses intact BL.    ASSESSMENT/PLAN:   No problem-specific Assessment & Plan notes found for this encounter.     [name], MD [place]

## 2020-12-15 NOTE — Patient Instructions (Incomplete)
Thank you for coming to see me today. It was a pleasure.   I have placed an order for CT chest.  Please go to [place] at [facility] or at [facility] to have this completed.  You do not need an appointment, but if you would like to call them beforehand, their number is [phone].  We will contact you with your results afterwards.   Please follow-up with 1 month for A1c check and labs  If you have any questions or concerns, please do not hesitate to call the office at [phone].  Best,   [name], MD  Family Medicine Residency

## 2020-12-25 ENCOUNTER — Other Ambulatory Visit: Payer: Self-pay | Admitting: Family Medicine

## 2021-01-21 ENCOUNTER — Encounter: Payer: Self-pay | Admitting: Gastroenterology

## 2021-02-08 ENCOUNTER — Encounter: Payer: Self-pay | Admitting: Student in an Organized Health Care Education/Training Program

## 2021-02-08 ENCOUNTER — Other Ambulatory Visit: Payer: Self-pay

## 2021-02-08 ENCOUNTER — Ambulatory Visit
Payer: PPO | Attending: Student in an Organized Health Care Education/Training Program | Admitting: Student in an Organized Health Care Education/Training Program

## 2021-02-08 VITALS — BP 194/82 | HR 53 | Temp 97.2°F | Ht 75.0 in | Wt 175.0 lb

## 2021-02-08 DIAGNOSIS — M65311 Trigger thumb, right thumb: Secondary | ICD-10-CM | POA: Diagnosis not present

## 2021-02-08 DIAGNOSIS — M25512 Pain in left shoulder: Secondary | ICD-10-CM

## 2021-02-08 DIAGNOSIS — M47816 Spondylosis without myelopathy or radiculopathy, lumbar region: Secondary | ICD-10-CM | POA: Diagnosis not present

## 2021-02-08 DIAGNOSIS — G8929 Other chronic pain: Secondary | ICD-10-CM

## 2021-02-08 DIAGNOSIS — E1142 Type 2 diabetes mellitus with diabetic polyneuropathy: Secondary | ICD-10-CM

## 2021-02-08 DIAGNOSIS — G894 Chronic pain syndrome: Secondary | ICD-10-CM

## 2021-02-08 DIAGNOSIS — M19012 Primary osteoarthritis, left shoulder: Secondary | ICD-10-CM

## 2021-02-08 DIAGNOSIS — G5601 Carpal tunnel syndrome, right upper limb: Secondary | ICD-10-CM

## 2021-02-08 MED ORDER — HYDROCODONE-ACETAMINOPHEN 5-325 MG PO TABS: 1.0000 | ORAL_TABLET | Freq: Two times a day (BID) | ORAL | 0 refills | Status: DC | PRN

## 2021-02-08 MED ORDER — GABAPENTIN 400 MG PO CAPS: 1200.0000 mg | ORAL_CAPSULE | Freq: Every day | ORAL | 2 refills | Status: DC

## 2021-02-08 NOTE — Progress Notes (Signed)
PROVIDER NOTE: Information contained herein reflects review and annotations entered in association with encounter. Interpretation of such information and data should be left to medically-trained personnel. Information provided to patient can be located elsewhere in the medical record under "Patient Instructions". Document created using STT-dictation technology, any transcriptional errors that may result from process are unintentional.    Patient: Gregory Harrison  Service Category: E/M  Provider: Gillis Santa, MD  DOB: 05-10-1951  DOS: 02/08/2021  Specialty: Interventional Pain Management  MRN: [id number]  Setting: Ambulatory outpatient  PCP: [name], MD  Type: Established Patient    Referring Provider: [name], MD  Location: Office  Delivery: Face-to-face     HPI  Gregory Harrison, a 70 y.o. year old male, is here today because of his Primary osteoarthritis of left shoulder [M19.012]. Gregory Harrison primary complain today is Back Pain Last encounter: My last encounter with him was on 11/30/2020. Pertinent problems: Gregory Harrison has Type 2 diabetes mellitus with diabetic polyneuropathy, without long-term current use of insulin ([place]); Chronic left shoulder pain; Carpal tunnel syndrome of right wrist; Trigger finger of right thumb; Lumbar facet arthropathy; Lumbar degenerative disc disease; Bilateral primary osteoarthritis of knee; Primary osteoarthritis of left shoulder; and Trigger finger, right little finger on their pertinent problem list. Pain Assessment: Severity of Chronic pain is reported as a 9 /10. Location: Back (both knee, shoulder) Right,Left/pain radiaties down both leg. Onset: More than a month ago. Quality: Burning,Aching,Constant. Timing: Constant. Modifying factor(s): meds and rest. Vitals:  height is '6\' 3"'  (1.905 m) and weight is 175 lb (79.4 kg). His temperature is 97.2 F (36.2 C) (abnormal). His blood pressure is 194/82 (abnormal) and his pulse is 53 (abnormal). His oxygen saturation  is 100%.   Reason for encounter: medication management.    Patient presents today for medication management.  He states that overall he is doing well.  He is somewhat frustrated and confused at the hospital bill that he received after his shoulder steroid injection in his left shoulder.  He states that he would have never went forward with the injection if he knew it was going to cause this much.  He states that he is pleased with the care that he has received here but wishes it was more information given regarding cost of injection.  He states that he had a shoulder injection done at an orthopedic clinic about 15 years ago that was less than $40.  I told him that I would look into this.  Otherwise no side effects with his medication, will refill as below for the next 3 months.  We will repeat urine toxicology screen for annual medication compliance and monitoring.  Pharmacotherapy Assessment   Analgesic: Hydrocodone 5 mg twice daily as needed, quantity 60/month MME equals 10    Monitoring: [place] PMP: PDMP not reviewed this encounter.       Pharmacotherapy: No side-effects or adverse reactions reported. Compliance: No problems identified. Effectiveness: Clinically acceptable.  Chauncey Fischer, RN  02/08/2021  8:53 AM  Sign when Signing Visit Nursing Pain Medication Assessment:  Safety precautions to be maintained throughout the outpatient stay will include: orient to surroundings, keep bed in low position, maintain call bell within reach at all times, provide assistance with transfer out of bed and ambulation.  Medication Inspection Compliance: Pill count conducted under aseptic conditions, in front of the patient. Neither the pills nor the bottle was removed from the patient's sight at any time. Once count was completed pills were  immediately returned to the patient in their original bottle.  Medication: Hydrocodone/APAP Pill/Patch Count: 19 of 60 pills remain Pill/Patch Appearance: Markings  consistent with prescribed medication Bottle Appearance: Standard pharmacy container. Clearly labeled. Filled Date: 2 / 5 / 22 Last Medication intake:  YesterdaySafety precautions to be maintained throughout the outpatient stay will include: orient to surroundings, keep bed in low position, maintain call bell within reach at all times, provide assistance with transfer out of bed and ambulation.     UDS:  Summary  Date Value Ref Range Status  10/25/2019 Note  Final    Comment:    ==================================================================== Compliance Drug Analysis, Ur ==================================================================== Test                             Result       Flag       Units Drug Present and Declared for Prescription Verification   Gabapentin                     PRESENT      EXPECTED   Acetaminophen                  PRESENT      EXPECTED Drug Absent but Declared for Prescription Verification   Hydrocodone                    Not Detected UNEXPECTED ng/mg creat   Tramadol                       Not Detected UNEXPECTED ng/mg creat   Diclofenac                     Not Detected UNEXPECTED    Diclofenac, as indicated in the declared medication list, is not    always detected even when used as directed. ==================================================================== Test                      Result    Flag   Units      Ref Range   Creatinine              33               mg/dL      >=20 ==================================================================== Declared Medications:  The flagging and interpretation on this report are based on the  following declared medications.  Unexpected results may arise from  inaccuracies in the declared medications.  **Note: The testing scope of this panel includes these medications:  Gabapentin (Neurontin)  Hydrocodone (Norco)  Tramadol  **Note: The testing scope of this panel does not include small to  moderate amounts  of these reported medications:  Acetaminophen (Norco)  Diclofenac (Voltaren)  **Note: The testing scope of this panel does not include the  following reported medications:  Atorvastatin (Lipitor)  Hydrochlorothiazide (Hydrodiuril)  Losartan (Cozaar)  Meloxicam (Mobic)  Metformin  Polyethylene Glycol  Topical ==================================================================== For clinical consultation, please call [phone]. ====================================================================      ROS  Constitutional: Denies any fever or chills Gastrointestinal: No reported hemesis, hematochezia, vomiting, or acute GI distress Musculoskeletal: Left shoulder pain, low back pain Neurological: No reported episodes of acute onset apraxia, aphasia, dysarthria, agnosia, amnesia, paralysis, loss of coordination, or loss of consciousness  Medication Review  Alcohol Swabs, Basaglar KwikPen, HYDROcodone-acetaminophen, Insulin Pen Needle, ONE TOUCH DELICA LANCING DEV, OneTouch Delica Lancets  Fine, OneTouch Verio, OneTouch Verio Reflect, atorvastatin, blood glucose meter kit and supplies, cetirizine, empagliflozin, gabapentin, glucose blood, guaiFENesin-dextromethorphan, hydrochlorothiazide, losartan, meloxicam, and metFORMIN  History Review  Allergy: Gregory Harrison has No Known Allergies. Drug: Gregory Harrison  reports previous drug use. Alcohol:  reports no history of alcohol use. Tobacco:  reports that he has quit smoking. He has a 40.00 pack-year smoking history. He has never used smokeless tobacco. Social: Gregory Harrison  reports that he has quit smoking. He has a 40.00 pack-year smoking history. He has never used smokeless tobacco. He reports previous drug use. He reports that he does not drink alcohol. Medical:  has a past medical history of Arthritis, Diabetes ([place]) (2000), Diabetic neuropathy ([place]), GERD (gastroesophageal reflux disease), HTN (hypertension) (2010), Hypercholesterolemia, and  Vision problem. Surgical: Gregory Harrison  has a past surgical history that includes Hand surgery (1980s); Nose surgery (1979); and Eye surgery (Left, 1990s). Family: family history includes ALS in his brother; Diabetes in his paternal grandfather; Heart attack in his father; Heart murmur in his father; Other in his mother.  Laboratory Chemistry Profile   Renal Lab Results  Component Value Date   BUN 17 10/19/2020   CREATININE 0.99 10/19/2020   BCR 17 10/19/2020   GFRAA 88 10/19/2020   GFRNONAA 76 10/19/2020     Hepatic Lab Results  Component Value Date   AST 16 07/15/2019   ALT 20 07/15/2019   ALBUMIN 4.9 (H) 07/15/2019   ALKPHOS 64 07/15/2019     Electrolytes Lab Results  Component Value Date   NA 140 10/19/2020   K 4.3 10/19/2020   CL 101 10/19/2020   CALCIUM 9.4 10/19/2020     Bone No results found for: VD25OH, VD125OH2TOT, EH2094BS9, GG8366QH4, 25OHVITD1, 25OHVITD2, 25OHVITD3, TESTOFREE, TESTOSTERONE   Inflammation (CRP: Acute Phase) (ESR: Chronic Phase) No results found for: CRP, ESRSEDRATE, LATICACIDVEN     Note: Above Lab results reviewed.  Recent Imaging Review  DG Chest 2 View CLINICAL DATA:  Productive cough for 3 weeks.  EXAM: CHEST - 2 VIEW  COMPARISON:  Chest CTA 08/12/2007  FINDINGS: The cardiac silhouette is normal in size. Mild right hilar prominence is similar to the prior CTA and attributed to vasculature. Aortic atherosclerosis is noted. There is anterior eventration of the right hemidiaphragm. The lungs are well inflated and clear. No pleural effusion or pneumothorax is identified. There is mild to moderate S-shaped thoracic scoliosis.  IMPRESSION: No active cardiopulmonary disease.  Electronically Signed   By: Logan Bores M.D.   On: 09/20/2020 16:11 Note: Reviewed        Physical Exam  General appearance: Well nourished, well developed, and well hydrated. In no apparent acute distress Mental status: Alert, oriented x 3 (person,  place, & time)       Respiratory: No evidence of acute respiratory distress Eyes: PERLA Vitals: BP (!) 194/82   Pulse (!) 53   Temp (!) 97.2 F (36.2 C)   Ht '6\' 3"'  (1.905 m)   Wt 175 lb (79.4 kg)   SpO2 100%   BMI 21.87 kg/m  BMI: Estimated body mass index is 21.87 kg/m as calculated from the following:   Height as of this encounter: '6\' 3"'  (1.905 m).   Weight as of this encounter: 175 lb (79.4 kg). Ideal: Ideal body weight: 84.5 kg (186 lb 4.6 oz)  Left shoulder pain, reduced range of motion.  Pain with shoulder abduction. Positive low back pain, pain with facet loading Neuropathic pain of lower extremity 5  out of 5 strength bilateral lower extremity: Plantar flexion, dorsiflexion, knee flexion, knee extension.   Assessment   Status Diagnosis  Controlled Controlled Controlled 1. Primary osteoarthritis of left shoulder   2. Type 2 diabetes mellitus with diabetic polyneuropathy, without long-term current use of insulin ([place])   3. Diabetic polyneuropathy associated with type 2 diabetes mellitus ([facility])   4. Carpal tunnel syndrome of right wrist   5. Trigger finger of right thumb   6. Pain in joint of left shoulder   7. Chronic left shoulder pain   8. Lumbar facet arthropathy   9. Chronic pain syndrome      Updated Problems: Problem  Type 2 Diabetes Mellitus With Diabetic Polyneuropathy, Without Long-Term Current Use of Insulin ([facility])    Plan of Care   Gregory Harrison has a current medication list which includes the following long-term medication(s): atorvastatin, cetirizine, hydrochlorothiazide, basaglar kwikpen, losartan, metformin, gabapentin, [START ON 02/12/2021] hydrocodone-acetaminophen, [START ON 03/14/2021] hydrocodone-acetaminophen, and [START ON 04/13/2021] hydrocodone-acetaminophen.  Pharmacotherapy (Medications Ordered): Meds ordered this encounter  Medications  . HYDROcodone-acetaminophen (NORCO/VICODIN) 5-325 MG tablet    Sig: Take 1 tablet by mouth 2 (two)  times daily as needed for moderate pain. For chronic pain syndrome    Dispense:  60 tablet    Refill:  0  . HYDROcodone-acetaminophen (NORCO/VICODIN) 5-325 MG tablet    Sig: Take 1 tablet by mouth 2 (two) times daily as needed for moderate pain. For chronic pain syndrome    Dispense:  60 tablet    Refill:  0  . HYDROcodone-acetaminophen (NORCO/VICODIN) 5-325 MG tablet    Sig: Take 1 tablet by mouth 2 (two) times daily as needed for moderate pain. For chronic pain syndrome    Dispense:  60 tablet    Refill:  0  . gabapentin (NEURONTIN) 400 MG capsule    Sig: Take 3 capsules (1,200 mg total) by mouth at bedtime.    Dispense:  270 capsule    Refill:  2   Orders:  Orders Placed This Encounter  Procedures  . ToxASSURE Select 13 (MW), Urine    Volume: 30 ml(s). Minimum 3 ml of urine is needed. Document temperature of fresh sample. Indications: Long term (current) use of opiate analgesic [id number])    Order Specific Question:   Release to patient    Answer:   Immediate   Follow-up plan:   Return in about 3 months (around 05/11/2021) for Medication Management, in person.   Recent Visits Date Type Provider Dept  11/30/20 Office Visit Gillis Santa, MD [facility]  Showing recent visits within past 90 days and meeting all other requirements Today's Visits Date Type Provider Dept  02/08/21 Office Visit Gillis Santa, MD [facility]  Showing today's visits and meeting all other requirements Future Appointments No visits were found meeting these conditions. Showing future appointments within next 90 days and meeting all other requirements  I discussed the assessment and treatment plan with the patient. The patient was provided an opportunity to ask questions and all were answered. The patient agreed with the plan and demonstrated an understanding of the instructions.  Patient advised to call back or seek an in-person evaluation if the symptoms or condition  worsens.  Duration of encounter: 30 minutes.  Note by: Gillis Santa, MD Date: 02/08/2021; Time: 9:24 AM

## 2021-02-08 NOTE — Progress Notes (Signed)
Nursing Pain Medication Assessment:  Safety precautions to be maintained throughout the outpatient stay will include: orient to surroundings, keep bed in low position, maintain call bell within reach at all times, provide assistance with transfer out of bed and ambulation.  Medication Inspection Compliance: Pill count conducted under aseptic conditions, in front of the patient. Neither the pills nor the bottle was removed from the patient's sight at any time. Once count was completed pills were immediately returned to the patient in their original bottle.  Medication: Hydrocodone/APAP Pill/Patch Count: 19 of 60 pills remain Pill/Patch Appearance: Markings consistent with prescribed medication Bottle Appearance: Standard pharmacy container. Clearly labeled. Filled Date: 2 / 5 / 22 Last Medication intake:  YesterdaySafety precautions to be maintained throughout the outpatient stay will include: orient to surroundings, keep bed in low position, maintain call bell within reach at all times, provide assistance with transfer out of bed and ambulation.

## 2021-02-10 NOTE — Progress Notes (Signed)
Patient ID: Gregory Harrison  DOB: 1951-09-02 MRN: [id number]  Gregory Harrison is a 70 y.o. male with a PMH of Type 2 DM, HTN, Chronic Pain, HLD, here today for f/u diabetes.   HPI: Complains of feeling fatigued and weight loss.  Reports he has lost weight since diagnosed with DM.  CBG at home 180's-200's.  No hypoglycemic episodes.  Doesn't watch diet.  Reports that he was told that at his age if his sugars are in 200's that it is good.  Diabetes: Disease Monitoring: - Blood Sugar ranges- 189-201 - Medications: Metformin XR 1000mg  BID, Jardiance 25 mg daily, Lantus 30 u daily. Compliant: yes  - On  Atorvastatin 40 mg daily - Last eye exam: recently 2021 - Last foot exam: due today ROS: denies hypoglycemic sx, dizziness, diaphoresis, LOC, polyuria, polydipsia  Monitoring Labs and Parameters - Last A1C: 9.9 Lab Results  Component Value Date   HGBA1C 9.2 (A) 02/13/2021    Hypertension: - Medications: HCTZ 12.5 mg daily, Losartan 25 mg daily - Compliance: yes - Checking BP at home: sometimes.  SBP 140-'s-160's - Denies any SOB, CP, vision changes, LE edema, medication SEs, or symptoms of hypotension - Diet: regular diet, some unhealthy choices - Exercise: just working  ROS: All other systems otherwise negative, except as mentioned in HPI  Social hx: Denies use of illicit drugs none, alcohol use occasional Smoking status reviewed quit in 1989.  40 pack year history  Patient Active Problem List   Diagnosis Date Noted  . Post-viral cough syndrome 10/20/2020  . Acute viral bronchitis 09/13/2020  . Trigger finger, right little finger 01/15/2020  . Lumbar facet arthropathy 10/07/2019  . Lumbar degenerative disc disease 10/07/2019  . Bilateral primary osteoarthritis of knee 10/07/2019  . Primary osteoarthritis of left shoulder 10/07/2019  . Pain in joint, shoulder region 03/24/2019  . Carpal tunnel syndrome of right wrist 06/04/2018  . Trigger finger of right thumb 06/04/2018  .  Chronic left shoulder pain 02/26/2018  . Hypercholesterolemia   . Diabetic neuropathy ([place])   . HTN (hypertension) 12/09/2008  . Type 2 diabetes mellitus with diabetic polyneuropathy, without long-term current use of insulin ([facility]) 12/09/1998     Objective:  BP (!) 150/66   Pulse 64   Ht 6\' 3"  (1.905 m)   Wt 179 lb 8 oz (81.4 kg)   SpO2 99%   BMI 22.44 kg/m   General: NAD, pleasant Cardiac: RRR, normal heart sounds, no m/r/g Pulm: normal effort, CTAB GI: soft, nontender, nondistended Extremities: no edema or cyanosis. WWP. Skin: warm and dry, no rashes noted  Diabetic foot exam: No deformities, ulcerations, or other skin breakdown on feet bilaterally.  Sensation intact to monofilament and light touch.  PT and DP pulses intact BL.   Assessment & Plan:   Type 2 diabetes mellitus with diabetic polyneuropathy, without long-term current use of insulin ([facility]) A1c remains elevated at 9.2 today.  CBG at home 180-200.  Continues to have feelings of fatigue and reports weight loss since diagnosed with DM.  Discussed that uncontrolled DM can cause these symptoms.   -Increase Lantus to 32 units daily -Continue Jardiance 25 mg daily and Metformin XR 1000 mg BID.  -Forms for Lily care left in Pharm box for completion at patients request -Foot exam completed -Follow up appointment scheduled for April 4  HTN (hypertension) BP not at goal.  Discussed with patient adding low dose CCB.  Patient not wanting to add additional medication.  Offered to  increase Losartan but patient reported having some dizziness.  Discussed having 24hr blood pressure monitoring for better viewing of BP and patient in agreement -Scheduled with Dr Valentina Lucks for BP monitoring  -Follow up after results and will discuss if can increase or add medication to maximize BP control  Post-viral cough syndrome Cough now resolved.  Does have history of 40 pack year smoking and quit 1989. Discussed with patient CT for lung cancer  screening and patient agreeable. -Low dose CT chest for Ca screening -Follow up as needed  Hypercholesterolemia Compliant with  Atorvastatin 40 mg daily. LDL 101 (2019) -Repeat Lipid panel today, if remains elevated can increase dose    [facility] Resident

## 2021-02-10 NOTE — Patient Instructions (Signed)
Thank you for coming to see me today. It was a pleasure.   We will get some labs today.  If they are abnormal or we need to do something about them, I will call you.  If they are normal, I will send you a message on MyChart (if it is active) or a letter in the mail.  If you don't hear from [place] in 2 weeks, please call the office at the number below.  Increase Lantus to 32 units daily  You are scheduled to see Dr Valentina Lucks for Blood pressure monitoring on Thurs at 830.   Please make an appointment to seen me in 2 weeks.  I have placed an order for CT of your chest.  Please go to [place] at [employer] or at [facility] to have this completed.  You do not need an appointment, but if you would like to call them beforehand, their number is [phone].  We will contact you with your results afterwards.    If you have any questions or concerns, please do not hesitate to call the office at [phone].  Best,   [name], MD

## 2021-02-13 ENCOUNTER — Other Ambulatory Visit: Payer: Self-pay

## 2021-02-13 ENCOUNTER — Encounter: Payer: Self-pay | Admitting: Family Medicine

## 2021-02-13 ENCOUNTER — Telehealth: Payer: Self-pay

## 2021-02-13 ENCOUNTER — Ambulatory Visit (INDEPENDENT_AMBULATORY_CARE_PROVIDER_SITE_OTHER): Payer: PPO | Admitting: Family Medicine

## 2021-02-13 VITALS — BP 150/66 | HR 64 | Ht 75.0 in | Wt 179.5 lb

## 2021-02-13 DIAGNOSIS — Z122 Encounter for screening for malignant neoplasm of respiratory organs: Secondary | ICD-10-CM | POA: Diagnosis not present

## 2021-02-13 DIAGNOSIS — E7800 Pure hypercholesterolemia, unspecified: Secondary | ICD-10-CM

## 2021-02-13 DIAGNOSIS — E1142 Type 2 diabetes mellitus with diabetic polyneuropathy: Secondary | ICD-10-CM

## 2021-02-13 DIAGNOSIS — I10 Essential (primary) hypertension: Secondary | ICD-10-CM | POA: Diagnosis not present

## 2021-02-13 DIAGNOSIS — R058 Other specified cough: Secondary | ICD-10-CM | POA: Diagnosis not present

## 2021-02-13 LAB — POCT GLYCOSYLATED HEMOGLOBIN (HGB A1C): HbA1c, POC (controlled diabetic range): 9.2 % — AB (ref 0.0–7.0)

## 2021-02-13 NOTE — Telephone Encounter (Signed)
Spoke to patient about patient assistance apps he left today after his appointment. Let him know there are pages for him to sign and he needs to provide proof of income.  Pt said he can come in on 02/15/21 to sign and bring in financials, being that he has an 830am appointment.

## 2021-02-14 ENCOUNTER — Encounter: Payer: Self-pay | Admitting: Family Medicine

## 2021-02-14 LAB — BASIC METABOLIC PANEL
BUN/Creatinine Ratio: 28 — ABNORMAL HIGH (ref 10–24)
BUN: 27 mg/dL (ref 8–27)
CO2: 20 mmol/L (ref 20–29)
Calcium: 9.3 mg/dL (ref 8.6–10.2)
Chloride: 104 mmol/L (ref 96–106)
Creatinine, Ser: 0.97 mg/dL (ref 0.76–1.27)
Glucose: 149 mg/dL — ABNORMAL HIGH (ref 65–99)
Potassium: 4.5 mmol/L (ref 3.5–5.2)
Sodium: 139 mmol/L (ref 134–144)
eGFR: 83 mL/min/{1.73_m2} (ref >59)

## 2021-02-14 LAB — LIPID PANEL
Chol/HDL Ratio: 3.1 ratio (ref 0.0–5.0)
Cholesterol, Total: 101 mg/dL (ref 100–199)
HDL: 33 mg/dL — ABNORMAL LOW (ref >39)
LDL Chol Calc (NIH): 47 mg/dL (ref 0–99)
Triglycerides: 116 mg/dL (ref 0–149)
VLDL Cholesterol Cal: 21 mg/dL (ref 5–40)

## 2021-02-14 NOTE — Assessment & Plan Note (Signed)
Cough now resolved.  Does have history of 40 pack year smoking and quit 1989. Discussed with patient CT for lung cancer screening and patient agreeable. -Low dose CT chest for Ca screening -Follow up as needed

## 2021-02-14 NOTE — Assessment & Plan Note (Addendum)
A1c remains elevated at 9.2 today.  CBG at home 180-200.  Continues to have feelings of fatigue and reports weight loss since diagnosed with DM.  Discussed that uncontrolled DM can cause these symptoms.   -Increase Lantus to 32 units daily -Continue Jardiance 25 mg daily and Metformin XR 1000 mg BID.  -Forms for Lily care left in Pharm box for completion at patients request -Foot exam completed -Follow up appointment scheduled for April 4

## 2021-02-14 NOTE — Assessment & Plan Note (Signed)
BP not at goal.  Discussed with patient adding low dose CCB.  Patient not wanting to add additional medication.  Offered to increase Losartan but patient reported having some dizziness.  Discussed having 24hr blood pressure monitoring for better viewing of BP and patient in agreement -Scheduled with Dr Valentina Lucks for BP monitoring  -Follow up after results and will discuss if can increase or add medication to maximize BP control

## 2021-02-14 NOTE — Assessment & Plan Note (Signed)
Compliant with  Atorvastatin 40 mg daily. LDL 101 (2019) -Repeat Lipid panel today, if remains elevated can increase dose

## 2021-02-15 ENCOUNTER — Ambulatory Visit (INDEPENDENT_AMBULATORY_CARE_PROVIDER_SITE_OTHER): Payer: PPO | Admitting: Pharmacist

## 2021-02-15 ENCOUNTER — Other Ambulatory Visit: Payer: Self-pay

## 2021-02-15 DIAGNOSIS — I10 Essential (primary) hypertension: Secondary | ICD-10-CM | POA: Diagnosis not present

## 2021-02-15 NOTE — Progress Notes (Signed)
S:    Patient arrives in good spirits. Presents to the clinic for ambulatory blood pressure evaluation.   Patient was referred and last seen by Primary Care Provider on 02/13/21.    Medication compliance is reported to be good. Reports ocasional dizziness upon standing. Denies falls. Denies headaches  Discussed procedure for wearing the monitor and gave patient written instructions. Monitor was placed on non-dominant arm with instructions to return in the morning.   Current BP Medications include:  HCTZ 12.5 mg daily (AM), losartan 25 mg daily (AM)  Antihypertensives tried in the past include: none  Dietary habits include: -Breakfast: non healthy choices -Lunch/dinner: sandwich, canned soup, fish,  -Snacks: nutrigrain bar -Drinks: water, cut back on diet pepsi    Patient returns to clinic - 3/11 and reports no issues with BP monitor.  He stated that he slept well last night.    O:   Physical Exam Vitals reviewed.  Constitutional:      Appearance: Normal appearance.  Neurological:     General: No focal deficit present.     Mental Status: He is alert.  Psychiatric:        Mood and Affect: Mood normal.        Behavior: Behavior normal.        Thought Content: Thought content normal.    Review of Systems  Neurological: Positive for dizziness (intermittent).  All other systems reviewed and are negative.  Last 3 Office BP readings: BP Readings from Last 3 Encounters:  02/13/21 (!) 150/66  02/08/21 (!) 194/82  11/30/20 (!) 186/94   Clinical Atherosclerotic Cardiovascular Disease (ASCVD): No  The ASCVD Risk score Mikey Bussing DC Jr., et al., 2013) failed to calculate for the following reasons:   The valid total cholesterol range is 130 to 320 mg/dL  Basic Metabolic Panel    Component Value Date/Time   NA 139 02/13/2021 0952   K 4.5 02/13/2021 0952   CL 104 02/13/2021 0952   CO2 20 02/13/2021 0952   GLUCOSE 149 (H) 02/13/2021 0952   BUN 27 02/13/2021 0952   CREATININE  0.97 02/13/2021 0952   CALCIUM 9.3 02/13/2021 0952   GFRNONAA 76 10/19/2020 1537   GFRAA 88 10/19/2020 1537    Renal function: Estimated Creatinine Clearance: 80.4 mL/min (by C-G formula based on SCr of 0.97 mg/dL).   ABPM Study Data: Arm Placement left arm   Overall Mean 24hr BP:   145/72 mmHg HR: 69   Daytime Mean BP:  150/77 mmHg HR: 68   Nighttime Mean BP:  133/61 mmHg HR: 71   Dipping Pattern: Yes.    Sys:   11%   Dia: 21.4%   [normal dipping ~10-20%]  Non-hypertensive ABPM thresholds: daytime BP <125/75 mmHg, sleeptime BP <120/70 mmHg    A/P: History of hypertension since 2010 found to have isolated systolic hypertension given 24-hour ambulatory blood pressure assessment.  Elevated blood pressure, average 150/77, during awake hours however, patient is a dipper and has readings of 133/61 while asleep.  History of dizziness and falls multiple times over the past year are concerning.  He has a nocturnal dipping pattern that is normal.  Changes to medications none - today.  Consideration and discussion with PCP at next visit was explained as a difficult decision which should be made thoughtfully and with the PCP involvement.    Results reviewed and written information provided.  Total time in face-to-face counseling 25 minutes.   F/U Clinic Visit with Dr. Volanda Napoleon in early April.  Patient seen with Inis Sizer, PharmD Candidate, and Shauna Hugh, PharmD - PGY-1 Resident.

## 2021-02-15 NOTE — Progress Notes (Signed)
Submitted application for [facility] to [place] for patient assistance.   Phone: [phone]

## 2021-02-15 NOTE — Patient Instructions (Addendum)
Wearing the Blood Pressure Monitor  The cuff will inflate every 20 minutes during the day and every 30 minutes while you sleep.  Your blood pressure readings will NOT display after cuff inflation  Fill out the blood pressure-activity diary during the day, especially during activities that may affect your reading -- such as exercise, stress, walking, taking your blood pressure medications  Important things to know:  Avoid taking the monitor off for the next 24 hours, unless it causes you discomfort or pain.  Do NOT get the monitor wet and do NOT dry to clean the monitor with any cleaning products.  Do NOT put the monitor on anyone else's arm.  When the cuff inflates, avoid excess movement. Let the cuffed arm hang loosely, slightly away from the body. Avoid flexing the muscles or moving the hand/fingers.  When you go to sleep, make sure that the hose is not kinked.  Remember to fill out the blood pressure activity diary.  If you experience severe pain or unusual pain (not associated with getting your blood pressure checked), remove the monitor.  Troubleshooting:  Code  Troubleshooting   1  Check cuff position, tighten cuff   2, 3  Remain still during reading   4, 87  Check air hose connections and make sure cuff is tight   85, 89  Check hose connections and make tubing is not crimped   86  Push START/STOP to restart reading   88, 91  Retry by pushing START/STOP   90  Replace batteries. If problem persists, remove monitor and bring back to   clinic at follow up   97, 98, 99  Service required - Remove monitor and bring back to clinic at follow up   Blood Pressure Activity Diary Time Lying down/ Sleeping Walking/ Exercise Stressed/ Angry Headache/ Pain Dizzy  9 AM       10 AM       11 AM       12 PM       1 PM       2 PM       Time Lying down/ Sleeping Walking/ Exercise Stressed/ Angry Headache/ Pain Dizzy  3 PM       4 PM        5 PM       6 PM       7 PM       8 PM        Time Lying down/ Sleeping Walking/ Exercise Stressed/ Angry Headache/ Pain Dizzy  9 PM       10 PM       11 PM       12 AM       1 AM       2 AM       3 AM       Time Lying down/ Sleeping Walking/ Exercise Stressed/ Angry Headache/ Pain Dizzy  4 AM       5 AM       6 AM       7 AM       8 AM       9 AM       10 AM        Time you woke up: _________                  Time you went to sleep:__________    Come back tomorrow at 8:30AM to have the  monitor removed  Call the [facility] if you have any questions before then [phone])

## 2021-02-16 ENCOUNTER — Encounter: Payer: Self-pay | Admitting: Pharmacist

## 2021-02-16 LAB — TOXASSURE SELECT 13 (MW), URINE

## 2021-02-16 NOTE — Assessment & Plan Note (Signed)
History of hypertension since 2010 found to have isolated systolic hypertension given 24-hour ambulatory blood pressure assessment.  Elevated blood pressure, average 150/77, during awake hours however, patient is a dipper and has readings of 133/61 while asleep.  History of dizziness and falls multiple times over the past year are concerning.  He has a nocturnal dipping pattern that is normal.  Changes to medications none - today.  Consideration and discussion with PCP at next visit was explained as a difficult decision which should be made thoughtfully and with the PCP involvement.

## 2021-02-19 ENCOUNTER — Telehealth: Payer: Self-pay | Admitting: Student in an Organized Health Care Education/Training Program

## 2021-02-19 NOTE — Progress Notes (Signed)
Reviewed: I agree with Dr. Koval's documentation and management. 

## 2021-02-19 NOTE — Telephone Encounter (Signed)
[facility], he wants to know if it can be called in to [facility] off of [address]. Please call patient when he can pick up medication

## 2021-02-19 NOTE — Telephone Encounter (Signed)
Will you send his scripts for Hydrocodone to the [facility]?  I have changed the pharmacy in his profile.

## 2021-02-20 MED ORDER — HYDROCODONE-ACETAMINOPHEN 5-325 MG PO TABS: 1.0000 | ORAL_TABLET | Freq: Two times a day (BID) | ORAL | 0 refills | Status: DC | PRN

## 2021-02-20 NOTE — Telephone Encounter (Signed)
Scripts for Hydrocodone for 3/22, 4/22, and 5/22 [facility] at [facility]. Patient notified scripts have been sent to [facility].

## 2021-02-20 NOTE — Telephone Encounter (Signed)
[facility] and [facility].  New prescriptions have been sent to [facility].  Please document.

## 2021-02-22 NOTE — Progress Notes (Signed)
Submitted application for JARDIANCE to [employer] ([employer]) for patient assistance.   Phone: [phone]  

## 2021-02-28 ENCOUNTER — Telehealth: Payer: Self-pay

## 2021-02-28 ENCOUNTER — Ambulatory Visit
Admission: RE | Admit: 2021-02-28 | Discharge: 2021-02-28 | Disposition: A | Discharge status: Home / Self Care | Payer: PPO | Source: Ambulatory Visit | Attending: Family Medicine | Admitting: Family Medicine

## 2021-02-28 DIAGNOSIS — Z122 Encounter for screening for malignant neoplasm of respiratory organs: Secondary | ICD-10-CM

## 2021-02-28 NOTE — Telephone Encounter (Signed)
Received phone call from [place] regarding order for CT Lung Cancer screening. Per technician, patient does not qualify for this test since his quit date was greater than 15 years ago.   If imaging is still needed, new order for CT Lung w/o contrast would need to be placed.   Please advise.   Talbot Grumbling, RN

## 2021-03-01 NOTE — Progress Notes (Signed)
Received notification from [place] regarding approval for [facility]. Patient assistance approved from 02/17/21 to 12/08/21.  Meds on auto refill & ship to the patients home. He rec'd his first shipment 02/23/21.  Phone: [phone]

## 2021-03-01 NOTE — Progress Notes (Signed)
Noted and agree. 

## 2021-03-08 ENCOUNTER — Telehealth: Payer: Self-pay

## 2021-03-08 NOTE — Telephone Encounter (Signed)
Noted and agree. 

## 2021-03-08 NOTE — Telephone Encounter (Signed)
Received notification from [place] [employer]) regarding approval for JARDIANCE 25mg . Patient assistance approved from 02/24/21 to 12/08/21.  Medication will ship to patients home in 7-10 business days. Patient aware he can request his own refills, or he can call me.  Phone: [phone] ([place])

## 2021-03-09 ENCOUNTER — Ambulatory Visit: Payer: PPO | Admitting: Family Medicine

## 2021-03-13 ENCOUNTER — Encounter: Payer: Self-pay | Admitting: Family Medicine

## 2021-03-13 ENCOUNTER — Telehealth (INDEPENDENT_AMBULATORY_CARE_PROVIDER_SITE_OTHER): Payer: PPO | Admitting: Family Medicine

## 2021-03-13 DIAGNOSIS — I10 Essential (primary) hypertension: Secondary | ICD-10-CM

## 2021-03-13 DIAGNOSIS — E1142 Type 2 diabetes mellitus with diabetic polyneuropathy: Secondary | ICD-10-CM

## 2021-03-13 NOTE — Progress Notes (Signed)
[facility] Telemedicine Visit  Patient consented to have virtual visit and was identified by name and date of birth. Method of visit: Telephone  Encounter participants: Patient: Gregory Harrison - located at home Provider: [name] - located at home Others (if applicable):   Chief Complaint: follow up high blood pressure and diabetes  HPI:  DM Type 2 CBG 119-300 since increase in Lantus from last visit. Has had one episode of low sugars at night,CBG of 66 which patient attributes to not eating much that day.  No episodes since.  He reports that he is not compliant with diet.  He will eat whatever he wants when he feels he is hungry.  Denies any chest pain,SOB,nausea, vomiting abdominal pain, polydipsia,polyuria,or diarrhea.    HTN Recent BP monitoring with Dr Valentina Lucks showed low readings at night.  He reports feeling good and no recent dizziness.  SBP at home today 119.   ROS: per HPI  Pertinent PMHx: DM Type 2 HTN  Exam:  There were no vitals taken for this visit.  Respiratory: Speaking in full sentences, no SOB or IWOB during call  Assessment/Plan:  Type 2 diabetes mellitus with diabetic polyneuropathy, without long-term current use of insulin (HCC) Reports one episode of low sugar since Lantus increased.  CBG's have been ranging mostly 110-300's. Not compliant with diet.  Last A1c 9.2. -Continue with current diabetic regimen -Will look into Ozempic and Libre sensor.  If this is covered by insurance patient would be agreeable to starting. -Discussed limiting sweets from diet and increase protein intake to include snack at night. -Follow up 3/12 or sooner if needed  HTN (hypertension) Reports SBP 119 today.  Denies any recent episodes of dizziness.  -Continue current therapy with SBP goal 150 -Follow up as needed     Time spent during visit with patient: 79minutes

## 2021-03-13 NOTE — Assessment & Plan Note (Signed)
Reports SBP 119 today.  Denies any recent episodes of dizziness.  -Continue current therapy with SBP goal 150 -Follow up as needed

## 2021-03-13 NOTE — Assessment & Plan Note (Signed)
Reports one episode of low sugar since Lantus increased.  CBG's have been ranging mostly 110-300's. Not compliant with diet.  Last A1c 9.2. -Continue with current diabetic regimen -Will look into Ozempic and Libre sensor.  If this is covered by insurance patient would be agreeable to starting. -Discussed limiting sweets from diet and increase protein intake to include snack at night. -Follow up 3/12 or sooner if needed

## 2021-05-03 ENCOUNTER — Other Ambulatory Visit: Payer: Self-pay

## 2021-05-03 ENCOUNTER — Ambulatory Visit
Payer: PPO | Attending: Student in an Organized Health Care Education/Training Program | Admitting: Student in an Organized Health Care Education/Training Program

## 2021-05-03 ENCOUNTER — Encounter: Payer: Self-pay | Admitting: Student in an Organized Health Care Education/Training Program

## 2021-05-03 VITALS — BP 188/90 | HR 60 | Temp 97.6°F | Resp 16 | Ht 74.5 in | Wt 175.0 lb

## 2021-05-03 DIAGNOSIS — G8929 Other chronic pain: Secondary | ICD-10-CM

## 2021-05-03 DIAGNOSIS — G894 Chronic pain syndrome: Secondary | ICD-10-CM | POA: Diagnosis not present

## 2021-05-03 DIAGNOSIS — M5136 Other intervertebral disc degeneration, lumbar region: Secondary | ICD-10-CM | POA: Diagnosis not present

## 2021-05-03 DIAGNOSIS — G5601 Carpal tunnel syndrome, right upper limb: Secondary | ICD-10-CM

## 2021-05-03 DIAGNOSIS — M19012 Primary osteoarthritis, left shoulder: Secondary | ICD-10-CM

## 2021-05-03 DIAGNOSIS — M25512 Pain in left shoulder: Secondary | ICD-10-CM | POA: Diagnosis not present

## 2021-05-03 DIAGNOSIS — M47816 Spondylosis without myelopathy or radiculopathy, lumbar region: Secondary | ICD-10-CM | POA: Diagnosis not present

## 2021-05-03 DIAGNOSIS — E1142 Type 2 diabetes mellitus with diabetic polyneuropathy: Secondary | ICD-10-CM

## 2021-05-03 DIAGNOSIS — M51369 Other intervertebral disc degeneration, lumbar region without mention of lumbar back pain or lower extremity pain: Secondary | ICD-10-CM

## 2021-05-03 DIAGNOSIS — M170 Bilateral primary osteoarthritis of knee: Secondary | ICD-10-CM | POA: Diagnosis not present

## 2021-05-03 DIAGNOSIS — M65311 Trigger thumb, right thumb: Secondary | ICD-10-CM

## 2021-05-03 MED ORDER — HYDROCODONE-ACETAMINOPHEN 5-325 MG PO TABS: 1.0000 | ORAL_TABLET | Freq: Two times a day (BID) | ORAL | 0 refills | Status: DC | PRN

## 2021-05-03 MED ORDER — HYDROCODONE-ACETAMINOPHEN 5-325 MG PO TABS
1.0000 | ORAL_TABLET | Freq: Two times a day (BID) | ORAL | 0 refills | Status: DC | PRN
Start: 2021-06-29 — End: 2021-09-20

## 2021-05-03 MED ORDER — GABAPENTIN 400 MG PO CAPS: 1200.0000 mg | ORAL_CAPSULE | Freq: Every day | ORAL | 3 refills | Status: DC

## 2021-05-03 NOTE — Progress Notes (Signed)
PROVIDER NOTE: Information contained herein reflects review and annotations entered in association with encounter. Interpretation of such information and data should be left to medically-trained personnel. Information provided to patient can be located elsewhere in the medical record under "Patient Instructions". Document created using STT-dictation technology, any transcriptional errors that may result from process are unintentional.    Patient: Gregory Harrison  Service Category: E/M  Provider: Gillis Santa, MD  DOB: 10-01-1951  DOS: 05/03/2021  Specialty: Interventional Pain Management  MRN: [id number]  Setting: Ambulatory outpatient  PCP: [name], MD  Type: Established Patient    Referring Provider: [name], MD  Location: Office  Delivery: Face-to-face     HPI  [name], a 70 y.o. year old male, is here today because of his Primary osteoarthritis of left shoulder [M19.012]. Patient [name] primary complain today is Back Pain Last encounter: My last encounter with him was on 02/19/2021. Pertinent problems: Mr. [name] has Type 2 diabetes mellitus with diabetic polyneuropathy, without long-term current use of insulin ([place]); Chronic left shoulder pain; Carpal tunnel syndrome of right wrist; Trigger finger of right thumb; Lumbar facet arthropathy; Lumbar degenerative disc disease; Bilateral primary osteoarthritis of knee; Primary osteoarthritis of left shoulder; and Trigger finger, right little finger on their pertinent problem list. Pain Assessment: Severity of Chronic pain is reported as a 0-No pain/10. Location: Back Lower/down back of legs to calves. Onset: More than a month ago. Quality: Aching,Discomfort. Timing: Intermittent. Modifying factor(s): States medication is helping him a lot. Vitals:  height is 6' 2.5" (1.892 m) and weight is 175 lb (79.4 kg). His temperature is 97.6 F (36.4 C). His blood pressure is 188/90 (abnormal) and his pulse is 60. His respiration is 16 and oxygen  saturation is 100%.   Reason for encounter: medication management.   No change in medical history since last visit.  Patient's pain is at baseline.  Patient continues multimodal pain regimen as prescribed.  States that it provides pain relief and improvement in functional status.   Pharmacotherapy Assessment   Analgesic: Hydrocodone 5 mg twice daily as needed, quantity 60/month MME equals 10    Monitoring: [place] PMP: PDMP reviewed during this encounter.       Pharmacotherapy: No side-effects or adverse reactions reported. Compliance: No problems identified. Effectiveness: Clinically acceptable.  Ignatius Specking, RN  05/03/2021  8:54 AM  Sign when Signing Visit Nursing Pain Medication Assessment:  Safety precautions to be maintained throughout the outpatient stay will include: orient to surroundings, keep bed in low position, maintain call bell within reach at all times, provide assistance with transfer out of bed and ambulation.  Medication Inspection Compliance: Pill count conducted under aseptic conditions, in front of the patient. Neither the pills nor the bottle was removed from the patient's sight at any time. Once count was completed pills were immediately returned to the patient in their original bottle.  Medication: Hydrocodone/APAP Pill/Patch Count: 55 of 60 pills remain Pill/Patch Appearance: Markings consistent with prescribed medication Bottle Appearance: Standard pharmacy container. Clearly labeled. Filled Date: 05 / 03 / 2022 Last Medication intake:  Today    UDS:  Summary  Date Value Ref Range Status  02/08/2021 Note  Final    Comment:    ==================================================================== [employer] Select 13 (MW) ==================================================================== Test                             Result       Flag  Units  Drug Present and Declared for Prescription Verification   Hydrocodone                    1369          EXPECTED   ng/mg creat   Hydromorphone                  190          EXPECTED   ng/mg creat   Norhydrocodone                 984          EXPECTED   ng/mg creat    Sources of hydrocodone include scheduled prescription medications.    Hydromorphone and norhydrocodone are expected metabolites of    hydrocodone. Hydromorphone is also available as a scheduled    prescription medication.  ==================================================================== Test                      Result    Flag   Units      Ref Range   Creatinine              61               mg/dL      >=20 ==================================================================== Declared Medications:  The flagging and interpretation on this report are based on the  following declared medications.  Unexpected results may arise from  inaccuracies in the declared medications.   **Note: The testing scope of this panel includes these medications:   Hydrocodone (Norco)   **Note: The testing scope of this panel does not include the  following reported medications:   Acetaminophen (Norco)  Atorvastatin (Lipitor)  Cetirizine (Zyrtec)  Empagliflozin (Jardiance)  Gabapentin (Neurontin)  Guaifenesin (Robitussin)  Hydrochlorothiazide (Hydrodiuril)  Insulin (Basaglar)  Losartan (Cozaar)  Meloxicam (Mobic)  Metformin (Glucophage) ==================================================================== For clinical consultation, please call [phone]. ====================================================================      ROS  Constitutional: Denies any fever or chills Gastrointestinal: No reported hemesis, hematochezia, vomiting, or acute GI distress Musculoskeletal: Denies any acute onset joint swelling, redness, loss of ROM, or weakness Neurological: No reported episodes of acute onset apraxia, aphasia, dysarthria, agnosia, amnesia, paralysis, loss of coordination, or loss of consciousness  Medication Review  Alcohol  Swabs, Basaglar KwikPen, HYDROcodone-acetaminophen, Insulin Pen Needle, ONE TOUCH DELICA LANCING DEV, OneTouch Delica Lancets Fine, OneTouch Verio, OneTouch Verio Reflect, atorvastatin, blood glucose meter kit and supplies, empagliflozin, gabapentin, glucose blood, hydrochlorothiazide, losartan, and metFORMIN  History Review  Allergy: Mr. Mcgaugh has No Known Allergies. Drug: Mr. Maden  reports previous drug use. Alcohol:  reports no history of alcohol use. Tobacco:  reports that he has quit smoking. He has a 40.00 pack-year smoking history. He has never used smokeless tobacco. Social: Mr. Mahl  reports that he has quit smoking. He has a 40.00 pack-year smoking history. He has never used smokeless tobacco. He reports previous drug use. He reports that he does not drink alcohol. Medical:  has a past medical history of Arthritis, Diabetes ([place]) (2000), Diabetic neuropathy ([place]), GERD (gastroesophageal reflux disease), HTN (hypertension) (2010), Hypercholesterolemia, Vision problem, and Weight loss (07/16/2019). Surgical: Mr. Gilliam  has a past surgical history that includes Hand surgery (1980s); Nose surgery (1979); and Eye surgery (Left, 1990s). Family: family history includes ALS in his brother; Diabetes in his paternal grandfather; Heart attack in his father; Heart murmur in his father; Other in his mother.  Laboratory Chemistry Profile  Renal Lab Results  Component Value Date   BUN 27 02/13/2021   CREATININE 0.97 02/13/2021   BCR 28 (H) 02/13/2021   GFRAA 88 10/19/2020   GFRNONAA 76 10/19/2020     Hepatic Lab Results  Component Value Date   AST 16 07/15/2019   ALT 20 07/15/2019   ALBUMIN 4.9 (H) 07/15/2019   ALKPHOS 64 07/15/2019     Electrolytes Lab Results  Component Value Date   NA 139 02/13/2021   K 4.5 02/13/2021   CL 104 02/13/2021   CALCIUM 9.3 02/13/2021     Bone No results found for: VD25OH, VD125OH2TOT, IY6415AX0, NM0768GS8, 25OHVITD1, 25OHVITD2, 25OHVITD3,  TESTOFREE, TESTOSTERONE   Inflammation (CRP: Acute Phase) (ESR: Chronic Phase) No results found for: CRP, ESRSEDRATE, LATICACIDVEN     Note: Above Lab results reviewed.  Recent Imaging Review  DG Chest 2 View CLINICAL DATA:  Productive cough for 3 weeks.  EXAM: CHEST - 2 VIEW  COMPARISON:  Chest CTA 08/12/2007  FINDINGS: The cardiac silhouette is normal in size. Mild right hilar prominence is similar to the prior CTA and attributed to vasculature. Aortic atherosclerosis is noted. There is anterior eventration of the right hemidiaphragm. The lungs are well inflated and clear. No pleural effusion or pneumothorax is identified. There is mild to moderate S-shaped thoracic scoliosis.  IMPRESSION: No active cardiopulmonary disease.  Electronically Signed   By: Logan Bores M.D.   On: 09/20/2020 16:11 Note: Reviewed        Physical Exam  General appearance: Well nourished, well developed, and well hydrated. In no apparent acute distress Mental status: Alert, oriented x 3 (person, place, & time)       Respiratory: No evidence of acute respiratory distress Eyes: PERLA Vitals: BP (!) 188/90   Pulse 60   Temp 97.6 F (36.4 C)   Resp 16   Ht 6' 2.5" (1.892 m)   Wt 175 lb (79.4 kg)   SpO2 100%   BMI 22.17 kg/m  BMI: Estimated body mass index is 22.17 kg/m as calculated from the following:   Height as of this encounter: 6' 2.5" (1.892 m).   Weight as of this encounter: 175 lb (79.4 kg). Ideal: Ideal body weight: 83.4 kg (183 lb 12.1 oz)  Bilateral shoulder pain, positive arthralgias myalgias Low back pain, pain with facet loading Neuropathy of lower extremity, paresthesias of bilateral feet  Assessment   Status Diagnosis  Controlled Controlled Controlled 1. Primary osteoarthritis of left shoulder   2. Type 2 diabetes mellitus with diabetic polyneuropathy, without long-term current use of insulin ([place])   3. Diabetic polyneuropathy associated with type 2 diabetes  mellitus ([place])   4. Chronic left shoulder pain   5. Carpal tunnel syndrome of right wrist   6. Trigger finger of right thumb   7. Pain in joint of left shoulder   8. Lumbar facet arthropathy   9. Bilateral primary osteoarthritis of knee   10. Lumbar degenerative disc disease   11. Chronic pain syndrome        Plan of Care  Mr. HOBY KAWAI has a current medication list which includes the following long-term medication(s): atorvastatin, hydrochlorothiazide, basaglar kwikpen, losartan, metformin, gabapentin, [START ON 05/30/2021] hydrocodone-acetaminophen, [START ON 06/29/2021] hydrocodone-acetaminophen, and [START ON 07/29/2021] hydrocodone-acetaminophen.  Pharmacotherapy (Medications Ordered): Meds ordered this encounter  Medications  . HYDROcodone-acetaminophen (NORCO/VICODIN) 5-325 MG tablet    Sig: Take 1 tablet by mouth 2 (two) times daily as needed for moderate pain. For chronic pain syndrome  Dispense:  60 tablet    Refill:  0  . HYDROcodone-acetaminophen (NORCO/VICODIN) 5-325 MG tablet    Sig: Take 1 tablet by mouth 2 (two) times daily as needed for moderate pain. For chronic pain syndrome    Dispense:  60 tablet    Refill:  0  . HYDROcodone-acetaminophen (NORCO/VICODIN) 5-325 MG tablet    Sig: Take 1 tablet by mouth 2 (two) times daily as needed for moderate pain. For chronic pain syndrome    Dispense:  60 tablet    Refill:  0  . gabapentin (NEURONTIN) 400 MG capsule    Sig: Take 3 capsules (1,200 mg total) by mouth at bedtime.    Dispense:  270 capsule    Refill:  3   Follow-up plan:   Return in about 16 weeks (around 08/23/2021) for Medication Management, in person.   Recent Visits Date Type Provider Dept  02/08/21 Office Visit Gillis Santa, MD [facility]  Showing recent visits within past 90 days and meeting all other requirements Today's Visits Date Type Provider Dept  05/03/21 Office Visit Gillis Santa, MD [facility]  Showing today's  visits and meeting all other requirements Future Appointments No visits were found meeting these conditions. Showing future appointments within next 90 days and meeting all other requirements  I discussed the assessment and treatment plan with the patient. The patient was provided an opportunity to ask questions and all were answered. The patient agreed with the plan and demonstrated an understanding of the instructions.  Patient advised to call back or seek an in-person evaluation if the symptoms or condition worsens.  Duration of encounter: 60mnutes.  Note by: BGillis Santa MD Date: 05/03/2021; Time: 9:13 AM

## 2021-05-03 NOTE — Progress Notes (Signed)
Nursing Pain Medication Assessment:  Safety precautions to be maintained throughout the outpatient stay will include: orient to surroundings, keep bed in low position, maintain call bell within reach at all times, provide assistance with transfer out of bed and ambulation.  Medication Inspection Compliance: Pill count conducted under aseptic conditions, in front of the patient. Neither the pills nor the bottle was removed from the patient's sight at any time. Once count was completed pills were immediately returned to the patient in their original bottle.  Medication: Hydrocodone/APAP Pill/Patch Count: 55 of 60 pills remain Pill/Patch Appearance: Markings consistent with prescribed medication Bottle Appearance: Standard pharmacy container. Clearly labeled. Filled Date: 05 / 03 / 2022 Last Medication intake:  Today

## 2021-06-28 DIAGNOSIS — H53021 Refractive amblyopia, right eye: Secondary | ICD-10-CM | POA: Diagnosis not present

## 2021-06-28 DIAGNOSIS — H5000 Unspecified esotropia: Secondary | ICD-10-CM | POA: Diagnosis not present

## 2021-06-28 DIAGNOSIS — E113393 Type 2 diabetes mellitus with moderate nonproliferative diabetic retinopathy without macular edema, bilateral: Secondary | ICD-10-CM | POA: Diagnosis not present

## 2021-06-28 DIAGNOSIS — H2513 Age-related nuclear cataract, bilateral: Secondary | ICD-10-CM | POA: Diagnosis not present

## 2021-06-28 LAB — HM DIABETES EYE EXAM

## 2021-07-23 ENCOUNTER — Telehealth: Payer: Self-pay

## 2021-07-23 NOTE — Telephone Encounter (Signed)
LVM to have pt call back to schedule AWV.   RE: confirm insurance and schedule AWV on my schedule if times are convenient for patient or other AWV schedule as template permits.   

## 2021-08-07 ENCOUNTER — Other Ambulatory Visit: Payer: Self-pay | Admitting: Family Medicine

## 2021-08-07 DIAGNOSIS — I10 Essential (primary) hypertension: Secondary | ICD-10-CM

## 2021-08-09 ENCOUNTER — Other Ambulatory Visit: Payer: Self-pay | Admitting: Family Medicine

## 2021-08-09 DIAGNOSIS — E7800 Pure hypercholesterolemia, unspecified: Secondary | ICD-10-CM

## 2021-08-21 ENCOUNTER — Encounter: Payer: PPO | Admitting: Student in an Organized Health Care Education/Training Program

## 2021-09-12 ENCOUNTER — Ambulatory Visit (INDEPENDENT_AMBULATORY_CARE_PROVIDER_SITE_OTHER): Payer: PPO

## 2021-09-12 ENCOUNTER — Other Ambulatory Visit: Payer: Self-pay

## 2021-09-12 DIAGNOSIS — Z23 Encounter for immunization: Secondary | ICD-10-CM

## 2021-09-12 NOTE — Progress Notes (Signed)
Patient presents to nurse clinic for flu and COVID bivalent booster vaccine.   See immunization flow sheet. Patient tolerated injections well and was observed for 15 minutes post injection. No adverse reaction noted.   Talbot Grumbling, RN

## 2021-09-20 ENCOUNTER — Encounter: Payer: Self-pay | Admitting: Student in an Organized Health Care Education/Training Program

## 2021-09-20 ENCOUNTER — Other Ambulatory Visit: Payer: Self-pay

## 2021-09-20 ENCOUNTER — Ambulatory Visit
Payer: PPO | Attending: Student in an Organized Health Care Education/Training Program | Admitting: Student in an Organized Health Care Education/Training Program

## 2021-09-20 VITALS — BP 191/83 | HR 59 | Temp 96.8°F | Resp 1 | Ht 74.0 in | Wt 178.0 lb

## 2021-09-20 DIAGNOSIS — M47816 Spondylosis without myelopathy or radiculopathy, lumbar region: Secondary | ICD-10-CM | POA: Insufficient documentation

## 2021-09-20 DIAGNOSIS — M25512 Pain in left shoulder: Secondary | ICD-10-CM | POA: Insufficient documentation

## 2021-09-20 DIAGNOSIS — M170 Bilateral primary osteoarthritis of knee: Secondary | ICD-10-CM | POA: Diagnosis not present

## 2021-09-20 DIAGNOSIS — G894 Chronic pain syndrome: Secondary | ICD-10-CM | POA: Insufficient documentation

## 2021-09-20 DIAGNOSIS — E1142 Type 2 diabetes mellitus with diabetic polyneuropathy: Secondary | ICD-10-CM | POA: Diagnosis not present

## 2021-09-20 DIAGNOSIS — M65311 Trigger thumb, right thumb: Secondary | ICD-10-CM | POA: Insufficient documentation

## 2021-09-20 DIAGNOSIS — G8929 Other chronic pain: Secondary | ICD-10-CM | POA: Diagnosis not present

## 2021-09-20 DIAGNOSIS — M19012 Primary osteoarthritis, left shoulder: Secondary | ICD-10-CM | POA: Diagnosis not present

## 2021-09-20 DIAGNOSIS — G5601 Carpal tunnel syndrome, right upper limb: Secondary | ICD-10-CM | POA: Diagnosis not present

## 2021-09-20 MED ORDER — HYDROCODONE-ACETAMINOPHEN 5-325 MG PO TABS: 1.0000 | ORAL_TABLET | Freq: Two times a day (BID) | ORAL | 0 refills | Status: DC | PRN

## 2021-09-20 MED ORDER — METHYLPREDNISOLONE 4 MG PO TBPK: ORAL_TABLET | ORAL | 0 refills | Status: AC

## 2021-09-20 MED ORDER — GABAPENTIN 400 MG PO CAPS: 1200.0000 mg | ORAL_CAPSULE | Freq: Every day | ORAL | 3 refills | Status: DC

## 2021-09-20 NOTE — Progress Notes (Signed)
PROVIDER NOTE: Information contained herein reflects review and annotations entered in association with encounter. Interpretation of such information and data should be left to medically-trained personnel. Information provided to patient can be located elsewhere in the medical record under "Patient Instructions". Document created using STT-dictation technology, any transcriptional errors that may result from process are unintentional.    Patient: Gregory Harrison  Service Category: E/M  Provider: Gillis Santa, MD  DOB: April 03, 1951  DOS: 09/20/2021  Specialty: Interventional Pain Management  MRN: [id number]  Setting: Ambulatory outpatient  PCP: [name], MD  Type: Established Patient    Referring Provider: [name], MD  Location: Office  Delivery: Face-to-face     HPI  Gregory Harrison, a 69 y.o. year old male, is here today because of his Primary osteoarthritis of left shoulder [M19.012]. Gregory Harrison primary complain today is Leg Pain (left) and Pain (Left shoulder, back, hands bilat) Last encounter: My last encounter with him was on 05/03/21 Pertinent problems: Gregory Harrison has Type 2 diabetes mellitus with diabetic polyneuropathy, without long-term current use of insulin ([place]); Chronic left shoulder pain; Carpal tunnel syndrome of right wrist; Trigger finger of right thumb; Lumbar facet arthropathy; Lumbar degenerative disc disease; Bilateral primary osteoarthritis of knee; Primary osteoarthritis of left shoulder; and Trigger finger, right little finger on their pertinent problem list. Pain Assessment: Severity of Chronic pain is reported as a 10-Worst pain ever/10. Location: Leg Left/left thigh to left knee. Onset: More than a month ago. Quality: Burning. Timing: Constant. Modifying factor(s):  Marland Kitchen Vitals:  height is '6\' 2"'  (1.88 m) and weight is 178 lb (80.7 kg). His temporal temperature is 96.8 F (36 C) (abnormal). His blood pressure is 191/83 (abnormal) and his pulse is 59 (abnormal). His  respiration is 1 (abnormal) and oxygen saturation is 100%.   Reason for encounter:   Patient presents today for medication management.  He is also having increased buttock pain that radiates down bilateral extremities, left greater than right in a posterior lateral distribution.  He states that he fell working couple of weeks ago on his right buttock.  He likely has piriformis strain dysfunction.  We discussed piriformis release exercises that he can practice at home.  He states that pressing into his deep gluteus muscle does help alleviate some of the pain.  We discussed a piriformis trigger point injection under fluoroscopy if his pain persists or worsens.  He continues to have persistent left shoulder pain.  We discussed a steroid taper.  Risk of increasing blood sugars were discussed with patient however he states that he would like to give this a try.  He has an upcoming appointment with his primary care provider.  I informed him to monitor his blood sugars closely as steroids can increase his blood glucose.  Patient endorsed understanding as well as the risks of short steroid taper.  Denies any p.o. steroids in the last 12 months.  I will also complete a handicap placard for him.  Unfortunately Gregory Harrison mother passed away this past 05-Mar-2023.  She was in [place].  He states that there are a lot of arrangements that need to be made and he has a lot of work ahead of him.  Pharmacotherapy Assessment  Analgesic: Hydrocodone 5 mg twice daily as needed, quantity 60/month MME equals 10    Monitoring: [place] PMP: PDMP reviewed during this encounter.       Pharmacotherapy: No side-effects or adverse reactions reported. Compliance: No problems identified. Effectiveness: Clinically acceptable.  Vanessa [place],  Manuela Schwartz, RN  09/20/2021  8:37 AM  Sign when Signing Visit Nursing Pain Medication Assessment:  Safety precautions to be maintained throughout the outpatient stay will include: orient to surroundings,  keep bed in low position, maintain call bell within reach at all times, provide assistance with transfer out of bed and ambulation.  Medication Inspection Compliance: Gregory Harrison did not comply with our request to bring his pills to be counted. He was reminded that bringing the medication bottles, even when empty, is a requirement.  Medication: None brought in. Pill/Patch Count: None available to be counted. Bottle Appearance: No container available. Did not bring bottle(s) to appointment. Filled Date: N/A Last Medication intake:  Yesterday      UDS:  Summary  Date Value Ref Range Status  02/08/2021 Note  Final    Comment:    ==================================================================== [facility] Select 13 (MW) ==================================================================== Test                             Result       Flag       Units  Drug Present and Declared for Prescription Verification   Hydrocodone                    1369         EXPECTED   ng/mg creat   Hydromorphone                  190          EXPECTED   ng/mg creat   Norhydrocodone                 984          EXPECTED   ng/mg creat    Sources of hydrocodone include scheduled prescription medications.    Hydromorphone and norhydrocodone are expected metabolites of    hydrocodone. Hydromorphone is also available as a scheduled    prescription medication.  ==================================================================== Test                      Result    Flag   Units      Ref Range   Creatinine              61               mg/dL      >=20 ==================================================================== Declared Medications:  The flagging and interpretation on this report are based on the  following declared medications.  Unexpected results may arise from  inaccuracies in the declared medications.   **Note: The testing scope of this panel includes these medications:   Hydrocodone (Norco)    **Note: The testing scope of this panel does not include the  following reported medications:   Acetaminophen (Norco)  Atorvastatin (Lipitor)  Cetirizine (Zyrtec)  Empagliflozin (Jardiance)  Gabapentin (Neurontin)  Guaifenesin (Robitussin)  Hydrochlorothiazide (Hydrodiuril)  Insulin (Basaglar)  Losartan (Cozaar)  Meloxicam (Mobic)  Metformin (Glucophage) ==================================================================== For clinical consultation, please call [phone]. ====================================================================      ROS  Constitutional: Denies any fever or chills Gastrointestinal: No reported hemesis, hematochezia, vomiting, or acute GI distress Musculoskeletal:  Bilateral piriformis pain, SI joint pain, left shoulder pain Neurological: No reported episodes of acute onset apraxia, aphasia, dysarthria, agnosia, amnesia, paralysis, loss of coordination, or loss of consciousness  Medication Review  Alcohol Swabs, Basaglar KwikPen, HYDROcodone-acetaminophen, Insulin Pen Needle, ONE TOUCH DELICA LANCING DEV, OneTouch  Delica Lancets Fine, OneTouch Verio, OneTouch Verio Reflect, atorvastatin, blood glucose meter kit and supplies, empagliflozin, gabapentin, glucose blood, hydrochlorothiazide, losartan, metFORMIN, and methylPREDNISolone  History Review  Allergy: Gregory Harrison has No Known Allergies. Drug: Gregory Harrison  reports that he does not currently use drugs. Alcohol:  reports no history of alcohol use. Tobacco:  reports that he has quit smoking. He has a 40.00 pack-year smoking history. He has never used smokeless tobacco. Social: Gregory Harrison  reports that he has quit smoking. He has a 40.00 pack-year smoking history. He has never used smokeless tobacco. He reports that he does not currently use drugs. He reports that he does not drink alcohol. Medical:  has a past medical history of Arthritis, Diabetes ([place]) (2000), Diabetic neuropathy ([place]), GERD  (gastroesophageal reflux disease), HTN (hypertension) (2010), Hypercholesterolemia, Vision problem, and Weight loss (07/16/2019). Surgical: Gregory Harrison  has a past surgical history that includes Hand surgery (1980s); Nose surgery (1979); and Eye surgery (Left, 1990s). Family: family history includes ALS in his brother; Diabetes in his paternal grandfather; Heart attack in his father; Heart murmur in his father; Other in his mother.  Laboratory Chemistry Profile   Renal Lab Results  Component Value Date   BUN 27 02/13/2021   CREATININE 0.97 02/13/2021   BCR 28 (H) 02/13/2021   GFRAA 88 10/19/2020   GFRNONAA 76 10/19/2020    Hepatic Lab Results  Component Value Date   AST 16 07/15/2019   ALT 20 07/15/2019   ALBUMIN 4.9 (H) 07/15/2019   ALKPHOS 64 07/15/2019    Electrolytes Lab Results  Component Value Date   NA 139 02/13/2021   K 4.5 02/13/2021   CL 104 02/13/2021   CALCIUM 9.3 02/13/2021    Bone No results found for: VD25OH, VD125OH2TOT, KV4259DG3, OV5643PI9, 25OHVITD1, 25OHVITD2, 25OHVITD3, TESTOFREE, TESTOSTERONE  Inflammation (CRP: Acute Phase) (ESR: Chronic Phase) No results found for: CRP, ESRSEDRATE, LATICACIDVEN       Note: Above Lab results reviewed.  Recent Imaging Review  DG Chest 2 View CLINICAL DATA:  Productive cough for 3 weeks.  EXAM: CHEST - 2 VIEW  COMPARISON:  Chest CTA 08/12/2007  FINDINGS: The cardiac silhouette is normal in size. Mild right hilar prominence is similar to the prior CTA and attributed to vasculature. Aortic atherosclerosis is noted. There is anterior eventration of the right hemidiaphragm. The lungs are well inflated and clear. No pleural effusion or pneumothorax is identified. There is mild to moderate S-shaped thoracic scoliosis.  IMPRESSION: No active cardiopulmonary disease.  Electronically Signed   By: Logan Bores M.D.   On: 09/20/2020 16:11 Note: Reviewed        Physical Exam  General appearance: Well nourished,  well developed, and well hydrated. In no apparent acute distress Mental status: Alert, oriented x 3 (person, place, & time)       Respiratory: No evidence of acute respiratory distress Eyes: PERLA Vitals: BP (!) 191/83   Pulse (!) 59   Temp (!) 96.8 F (36 C) (Temporal)   Resp (!) 1   Ht '6\' 2"'  (1.88 m)   Wt 178 lb (80.7 kg)   SpO2 100%   BMI 22.85 kg/m  BMI: Estimated body mass index is 22.85 kg/m as calculated from the following:   Height as of this encounter: '6\' 2"'  (1.88 m).   Weight as of this encounter: 178 lb (80.7 kg). Ideal: Ideal body weight: 82.2 kg (181 lb 3.5 oz)  Left shoulder pain, limited range of motion, limited shoulder abduction  and Low back pain Bilateral buttock pain consistent with piriformis strain Positive Patrick's, positive straight leg raise test  Assessment   Status Diagnosis  Having a Flare-up Having a Flare-up Having a Flare-up 1. Primary osteoarthritis of left shoulder   2. Chronic left shoulder pain   3. Carpal tunnel syndrome of right wrist   4. Trigger finger of right thumb   5. Diabetic polyneuropathy associated with type 2 diabetes mellitus (HCC)   6. Lumbar facet arthropathy   7. Bilateral primary osteoarthritis of knee   8. Type 2 diabetes mellitus with diabetic polyneuropathy, without long-term current use of insulin ([place])   9. Chronic pain syndrome      Plan of Care  Problem-specific:  No problem-specific Assessment & Plan notes found for this encounter.  Gregory Harrison has a current medication list which includes the following long-term medication(s): atorvastatin, basaglar kwikpen, losartan, metformin, gabapentin, hydrochlorothiazide, [START ON 10/23/2021] hydrocodone-acetaminophen, and [START ON 11/22/2021] hydrocodone-acetaminophen.  Pharmacotherapy (Medications Ordered): Meds ordered this encounter  Medications   methylPREDNISolone (MEDROL) 4 MG TBPK tablet    Sig: Follow package instructions.    Dispense:  21 tablet     Refill:  0    Do not add to the "Automatic Refill" notification system.   gabapentin (NEURONTIN) 400 MG capsule    Sig: Take 3 capsules (1,200 mg total) by mouth at bedtime.    Dispense:  270 capsule    Refill:  3   HYDROcodone-acetaminophen (NORCO/VICODIN) 5-325 MG tablet    Sig: Take 1 tablet by mouth 2 (two) times daily as needed for moderate pain. For chronic pain syndrome    Dispense:  60 tablet    Refill:  0   HYDROcodone-acetaminophen (NORCO/VICODIN) 5-325 MG tablet    Sig: Take 1 tablet by mouth 2 (two) times daily as needed for moderate pain. For chronic pain syndrome    Dispense:  60 tablet    Refill:  0   Consider piriformis trigger point injection under fluoroscopy if piriformis pain consistent   Follow-up plan:   Return in about 13 weeks (around 12/20/2021) for Medication Management, in person.        Recent Visits No visits were found meeting these conditions. Showing recent visits within past 90 days and meeting all other requirements Today's Visits Date Type Provider Dept  09/20/21 Office Visit Gillis Santa, MD [facility]  Showing today's visits and meeting all other requirements Future Appointments Date Type Provider Dept  12/13/21 Appointment Gillis Santa, MD [facility]  Showing future appointments within next 90 days and meeting all other requirements I discussed the assessment and treatment plan with the patient. The patient was provided an opportunity to ask questions and all were answered. The patient agreed with the plan and demonstrated an understanding of the instructions.  Patient advised to call back or seek an in-person evaluation if the symptoms or condition worsens.  Duration of encounter: 2mnutes.  Note by: BGillis Santa MD Date: 09/20/2021; Time: 10:01 AM

## 2021-09-20 NOTE — Progress Notes (Signed)
Nursing Pain Medication Assessment:  Safety precautions to be maintained throughout the outpatient stay will include: orient to surroundings, keep bed in low position, maintain call bell within reach at all times, provide assistance with transfer out of bed and ambulation.  Medication Inspection Compliance: Mr. Xia did not comply with our request to bring his pills to be counted. He was reminded that bringing the medication bottles, even when empty, is a requirement.  Medication: None brought in. Pill/Patch Count: None available to be counted. Bottle Appearance: No container available. Did not bring bottle(s) to appointment. Filled Date: N/A Last Medication intake:  Yesterday

## 2021-09-30 NOTE — Progress Notes (Deleted)
    SUBJECTIVE:   CHIEF COMPLAINT / HPI:   DM Type 2 HTN HLD   Shingrix Colonoscopy- reschedule.  Positive cologuard 2020 Eye Exam   PERTINENT  PMH / PSH: ***  OBJECTIVE:   There were no vitals taken for this visit.   General: Alert, no acute distress Cardio: Normal S1 and S2, RRR, no r/m/g Pulm: CTAB, normal work of breathing Abdomen: Bowel sounds normal. Abdomen soft and non-tender.  Extremities: No peripheral edema.  Neuro: Cranial nerves grossly intact   ASSESSMENT/PLAN:   No problem-specific Assessment & Plan notes found for this encounter.     [name], MD [place]

## 2021-10-02 ENCOUNTER — Ambulatory Visit: Payer: PPO | Admitting: Family Medicine

## 2021-10-25 ENCOUNTER — Telehealth: Payer: Self-pay | Admitting: Family Medicine

## 2021-10-25 NOTE — Telephone Encounter (Signed)
Patient dropped off [place] form completed. I have placed in Camille's box.

## 2021-10-31 NOTE — Progress Notes (Signed)
Submitted 2023 RE ENROLLMENT application for [employer] to [employer] [employer]) for patient assistance.   Phone: [phone]

## 2021-11-11 ENCOUNTER — Other Ambulatory Visit: Payer: Self-pay | Admitting: Family Medicine

## 2021-11-11 DIAGNOSIS — E7800 Pure hypercholesterolemia, unspecified: Secondary | ICD-10-CM

## 2021-11-29 ENCOUNTER — Other Ambulatory Visit: Payer: Self-pay | Admitting: Family Medicine

## 2021-11-29 DIAGNOSIS — E1142 Type 2 diabetes mellitus with diabetic polyneuropathy: Secondary | ICD-10-CM

## 2021-12-04 NOTE — Progress Notes (Signed)
Received notification from [employer]) regarding approval for [employer]. Patient assistance approved from 12/09/21 to 12/08/22.  MEDICATION WILL SHIP TO PT'S HOME  Phone: [phone]

## 2021-12-05 NOTE — Progress Notes (Signed)
Received notification from [place] regarding RE-ENROLLMENT approval for [facility]. Patient assistance approved from 12/09/21 to 12/08/22.  Phone: [phone]

## 2021-12-09 DIAGNOSIS — C801 Malignant (primary) neoplasm, unspecified: Secondary | ICD-10-CM

## 2021-12-09 HISTORY — DX: Malignant (primary) neoplasm, unspecified: C80.1

## 2021-12-13 ENCOUNTER — Encounter: Payer: Self-pay | Admitting: Student in an Organized Health Care Education/Training Program

## 2021-12-13 ENCOUNTER — Ambulatory Visit
Admission: RE | Admit: 2021-12-13 | Discharge: 2021-12-13 | Disposition: A | Discharge status: Home / Self Care | Payer: PPO | Source: Ambulatory Visit | Attending: Student in an Organized Health Care Education/Training Program | Admitting: Student in an Organized Health Care Education/Training Program

## 2021-12-13 ENCOUNTER — Ambulatory Visit: Payer: PPO | Admitting: Student in an Organized Health Care Education/Training Program

## 2021-12-13 ENCOUNTER — Ambulatory Visit
Admission: RE | Admit: 2021-12-13 | Discharge: 2021-12-13 | Disposition: A | Discharge status: Home / Self Care | Payer: PPO | Attending: Student in an Organized Health Care Education/Training Program | Admitting: Student in an Organized Health Care Education/Training Program

## 2021-12-13 ENCOUNTER — Other Ambulatory Visit: Payer: Self-pay

## 2021-12-13 VITALS — BP 156/95 | HR 67 | Temp 97.1°F | Resp 16 | Ht 74.5 in | Wt 178.0 lb

## 2021-12-13 DIAGNOSIS — M25552 Pain in left hip: Secondary | ICD-10-CM | POA: Insufficient documentation

## 2021-12-13 DIAGNOSIS — M533 Sacrococcygeal disorders, not elsewhere classified: Secondary | ICD-10-CM | POA: Insufficient documentation

## 2021-12-13 DIAGNOSIS — G5601 Carpal tunnel syndrome, right upper limb: Secondary | ICD-10-CM | POA: Insufficient documentation

## 2021-12-13 DIAGNOSIS — G8929 Other chronic pain: Secondary | ICD-10-CM | POA: Insufficient documentation

## 2021-12-13 DIAGNOSIS — E1142 Type 2 diabetes mellitus with diabetic polyneuropathy: Secondary | ICD-10-CM

## 2021-12-13 DIAGNOSIS — G894 Chronic pain syndrome: Secondary | ICD-10-CM | POA: Insufficient documentation

## 2021-12-13 DIAGNOSIS — M19012 Primary osteoarthritis, left shoulder: Secondary | ICD-10-CM | POA: Insufficient documentation

## 2021-12-13 DIAGNOSIS — M25512 Pain in left shoulder: Secondary | ICD-10-CM | POA: Insufficient documentation

## 2021-12-13 MED ORDER — HYDROCODONE-ACETAMINOPHEN 5-325 MG PO TABS: 1.0000 | ORAL_TABLET | Freq: Two times a day (BID) | ORAL | 0 refills | Status: DC | PRN

## 2021-12-13 MED ORDER — GABAPENTIN 400 MG PO CAPS: 1200.0000 mg | ORAL_CAPSULE | Freq: Every day | ORAL | 3 refills | Status: DC

## 2021-12-13 NOTE — Progress Notes (Signed)
Nursing Pain Medication Assessment:  Safety precautions to be maintained throughout the outpatient stay will include: orient to surroundings, keep bed in low position, maintain call bell within reach at all times, provide assistance with transfer out of bed and ambulation.  Medication Inspection Compliance: Pill count conducted under aseptic conditions, in front of the patient. Neither the pills nor the bottle was removed from the patient's sight at any time. Once count was completed pills were immediately returned to the patient in their original bottle.  Medication: Hydrocodone/APAP Pill/Patch Count:  13 of 60 pills remain Pill/Patch Appearance: Markings consistent with prescribed medication Bottle Appearance: Standard pharmacy container. Clearly labeled. Filled Date: 23 / 19 / 2022 Last Medication intake:  Today

## 2021-12-13 NOTE — Progress Notes (Signed)
PROVIDER NOTE: Information contained herein reflects review and annotations entered in association with encounter. Interpretation of such information and data should be left to medically-trained personnel. Information provided to patient can be located elsewhere in the medical record under "Patient Instructions". Document created using STT-dictation technology, any transcriptional errors that may result from process are unintentional.    Patient: Gregory Harrison  Service Category: E/M  Provider: Gillis Santa, MD  DOB: 1951-09-05  DOS: 12/13/2021  Specialty: Interventional Pain Management  MRN: [id number]  Setting: Ambulatory outpatient  PCP: [name], MD  Type: Established Patient    Referring Provider: [name], MD  Location: Office  Delivery: Face-to-face     HPI  [name], a 71 y.o. year old male, is here today because of his Chronic left hip pain [M25.552, G89.29]. Mr. Seidman primary complain today is Hip Pain (Bilateral ), Knee Pain (Bilateral ), Arm Pain (Bilateral ), and Back Pain (Lumbar bilateral ) Last encounter: My last encounter with him was on 09/20/2021. Pertinent problems: Mr. Boettcher has Type 2 diabetes mellitus with diabetic polyneuropathy, without long-term current use of insulin ([place]); Chronic left shoulder pain; Carpal tunnel syndrome of right wrist; Trigger finger of right thumb; Lumbar facet arthropathy; Lumbar degenerative disc disease; Bilateral primary osteoarthritis of knee; Primary osteoarthritis of left shoulder; and Trigger finger, right little finger on their pertinent problem list. Pain Assessment: Severity of Chronic pain is reported as a 10-Worst pain ever/10. Location: Back (see visit info) Lower, Left, Right/into hips and down both legs left is worse.  increased hip pain.. Onset: More than a month ago. Quality: Discomfort, Constant, Aching. Timing: Constant. Modifying factor(s): medication, rest. Vitals:  height is 6' 2.5" (1.892 m) and weight is 178 lb  (80.7 kg). His temporal temperature is 97.1 F (36.2 C) (abnormal). His blood pressure is 156/95 (abnormal) and his pulse is 67. His respiration is 16 and oxygen saturation is 99%.   Reason for encounter: medication management and worsening left hip pain that radiates into the left groin and worse with weightbearing  Patient presents today endorsing worsening left hip pain that is worse with weightbearing.  History of left hip osteoarthritis.  We will update left hip x-ray we will also obtain SI joint x-ray.  Referral to Dr. Zigmund Daniel with sports medicine to consider ultrasound-guided hip intervention. Otherwise refill of hydrocodone as below, patient takes very sparingly, 5 mg twice daily as needed. Continue gabapentin 1200 mg nightly.  Stable on current dose.  Pharmacotherapy Assessment  Analgesic: Hydrocodone 5 mg twice daily as needed, quantity 60/month MME equals 10    Monitoring: [place] PMP: PDMP reviewed during this encounter.       Pharmacotherapy: No side-effects or adverse reactions reported. Compliance: No problems identified. Effectiveness: Clinically acceptable.  Janett Billow, RN  12/13/2021  8:35 AM  Sign when Signing Visit Nursing Pain Medication Assessment:  Safety precautions to be maintained throughout the outpatient stay will include: orient to surroundings, keep bed in low position, maintain call bell within reach at all times, provide assistance with transfer out of bed and ambulation.  Medication Inspection Compliance: Pill count conducted under aseptic conditions, in front of the patient. Neither the pills nor the bottle was removed from the patient's sight at any time. Once count was completed pills were immediately returned to the patient in their original bottle.  Medication: Hydrocodone/APAP Pill/Patch Count:  13 of 60 pills remain Pill/Patch Appearance: Markings consistent with prescribed medication Bottle Appearance: Standard pharmacy container. Clearly  labeled. Filled Date: 29 / 19 / 2022 Last Medication intake:  Today   UDS:  Summary  Date Value Ref Range Status  02/08/2021 Note  Final    Comment:    ==================================================================== [facility] Select 13 (MW) ==================================================================== Test                             Result       Flag       Units  Drug Present and Declared for Prescription Verification   Hydrocodone                    1369         EXPECTED   ng/mg creat   Hydromorphone                  190          EXPECTED   ng/mg creat   Norhydrocodone                 984          EXPECTED   ng/mg creat    Sources of hydrocodone include scheduled prescription medications.    Hydromorphone and norhydrocodone are expected metabolites of    hydrocodone. Hydromorphone is also available as a scheduled    prescription medication.  ==================================================================== Test                      Result    Flag   Units      Ref Range   Creatinine              61               mg/dL      >=20 ==================================================================== Declared Medications:  The flagging and interpretation on this report are based on the  following declared medications.  Unexpected results may arise from  inaccuracies in the declared medications.   **Note: The testing scope of this panel includes these medications:   Hydrocodone (Norco)   **Note: The testing scope of this panel does not include the  following reported medications:   Acetaminophen (Norco)  Atorvastatin (Lipitor)  Cetirizine (Zyrtec)  Empagliflozin (Jardiance)  Gabapentin (Neurontin)  Guaifenesin (Robitussin)  Hydrochlorothiazide (Hydrodiuril)  Insulin (Basaglar)  Losartan (Cozaar)  Meloxicam (Mobic)  Metformin (Glucophage) ==================================================================== For clinical consultation, please call [phone]. ====================================================================      ROS  Constitutional: Denies any fever or chills Gastrointestinal: No reported hemesis, hematochezia, vomiting, or acute GI distress Musculoskeletal:  Left shoulder, left hip, left buttock pain Neurological: No reported episodes of acute onset apraxia, aphasia, dysarthria, agnosia, amnesia, paralysis, loss of coordination, or loss of consciousness  Medication Review  Alcohol Swabs, Basaglar KwikPen, HYDROcodone-acetaminophen, Insulin Pen Needle, ONE TOUCH DELICA LANCING DEV, OneTouch Delica Lancets Fine, OneTouch Verio, OneTouch Verio Reflect, atorvastatin, blood glucose meter kit and supplies, empagliflozin, gabapentin, glucose blood, hydrochlorothiazide, losartan, and metFORMIN  History Review  Allergy: Mr. Donoghue has No Known Allergies. Drug: Mr. Arey  reports that he does not currently use drugs. Alcohol:  reports no history of alcohol use. Tobacco:  reports that he has quit smoking. His smoking use included cigarettes. He has a 40.00 pack-year smoking history. He has never used smokeless tobacco. Social: Mr. Kelm  reports that he has quit smoking. His smoking use included cigarettes. He has a 40.00 pack-year smoking history. He has never used smokeless tobacco. He reports  that he does not currently use drugs. He reports that he does not drink alcohol. Medical:  has a past medical history of Arthritis, Diabetes ([place]) (2000), Diabetic neuropathy ([place]), GERD (gastroesophageal reflux disease), HTN (hypertension) (2010), Hypercholesterolemia, Vision problem, and Weight loss (07/16/2019). Surgical: Mr. Manetta  has a past surgical history that includes Hand surgery (1980s); Nose surgery (1979); and Eye surgery (Left, 1990s). Family: family history includes ALS in his brother; Diabetes in his paternal grandfather; Heart attack in his father; Heart murmur in his father; Other in his mother.  Laboratory Chemistry  Profile   Renal Lab Results  Component Value Date   BUN 27 02/13/2021   CREATININE 0.97 02/13/2021   BCR 28 (H) 02/13/2021   GFRAA 88 10/19/2020   GFRNONAA 76 10/19/2020    Hepatic Lab Results  Component Value Date   AST 16 07/15/2019   ALT 20 07/15/2019   ALBUMIN 4.9 (H) 07/15/2019   ALKPHOS 64 07/15/2019    Electrolytes Lab Results  Component Value Date   NA 139 02/13/2021   K 4.5 02/13/2021   CL 104 02/13/2021   CALCIUM 9.3 02/13/2021    Bone No results found for: VD25OH, VD125OH2TOT, XI5038UE2, CM0349ZP9, 25OHVITD1, 25OHVITD2, 25OHVITD3, TESTOFREE, TESTOSTERONE  Inflammation (CRP: Acute Phase) (ESR: Chronic Phase) No results found for: CRP, ESRSEDRATE, LATICACIDVEN       Note: Above Lab results reviewed.  Recent Imaging Review  DG Chest 2 View CLINICAL DATA:  Productive cough for 3 weeks.  EXAM: CHEST - 2 VIEW  COMPARISON:  Chest CTA 08/12/2007  FINDINGS: The cardiac silhouette is normal in size. Mild right hilar prominence is similar to the prior CTA and attributed to vasculature. Aortic atherosclerosis is noted. There is anterior eventration of the right hemidiaphragm. The lungs are well inflated and clear. No pleural effusion or pneumothorax is identified. There is mild to moderate S-shaped thoracic scoliosis.  IMPRESSION: No active cardiopulmonary disease.  Electronically Signed   By: Logan Bores M.D.   On: 09/20/2020 16:11 Note: Reviewed        Physical Exam  General appearance: Well nourished, well developed, and well hydrated. In no apparent acute distress Mental status: Alert, oriented x 3 (person, place, & time)       Respiratory: No evidence of acute respiratory distress Eyes: PERLA Vitals: BP (!) 156/95 (BP Location: Left Arm, Patient Position: Sitting, Cuff Size: Normal)    Pulse 67    Temp (!) 97.1 F (36.2 C) (Temporal)    Resp 16    Ht 6' 2.5" (1.892 m)    Wt 178 lb (80.7 kg)    SpO2 99%    BMI 22.55 kg/m  BMI: Estimated body mass  index is 22.55 kg/m as calculated from the following:   Height as of this encounter: 6' 2.5" (1.892 m).   Weight as of this encounter: 178 lb (80.7 kg). Ideal: Ideal body weight: 83.4 kg (183 lb 12.1 oz)  Lumbar Spine Area Exam  Skin & Axial Inspection: No masses, redness, or swelling Alignment: Symmetrical Functional ROM: Pain restricted ROM       Stability: No instability detected Muscle Tone/Strength: Functionally intact. No obvious neuro-muscular anomalies detected. Sensory (Neurological): Unimpaired  Gait & Posture Assessment  Ambulation: Unassisted Gait: Relatively normal for age and body habitus Posture: WNL  Lower Extremity Exam    Side: Right lower extremity  Side: Left lower extremity  Stability: No instability observed          Stability: No instability observed  Skin & Extremity Inspection: Skin color, temperature, and hair growth are WNL. No peripheral edema or cyanosis. No masses, redness, swelling, asymmetry, or associated skin lesions. No contractures.  Skin & Extremity Inspection: Skin color, temperature, and hair growth are WNL. No peripheral edema or cyanosis. No masses, redness, swelling, asymmetry, or associated skin lesions. No contractures.  Functional ROM: Unrestricted ROM                  Functional ROM: Pain restricted ROM for hip joint          Muscle Tone/Strength: Functionally intact. No obvious neuro-muscular anomalies detected.  Muscle Tone/Strength: Functionally intact. No obvious neuro-muscular anomalies detected.  Sensory (Neurological): Unimpaired        Sensory (Neurological): Musculoskeletal pain pattern        DTR: Patellar: deferred today Achilles: deferred today Plantar: deferred today  DTR: Patellar: deferred today Achilles: deferred today Plantar: deferred today  Palpation: No palpable anomalies  Palpation: No palpable anomalies    Assessment   Status Diagnosis  Having a Flare-up Having a Flare-up Persistent 1. Chronic left  hip pain   2. Chronic left SI joint pain   3. Primary osteoarthritis of left shoulder   4. Chronic left shoulder pain   5. Diabetic polyneuropathy associated with type 2 diabetes mellitus ([place])   6. Chronic pain syndrome   7. Type 2 diabetes mellitus with diabetic polyneuropathy, without long-term current use of insulin ([place])   8. Carpal tunnel syndrome of right wrist      Updated Problems: Problem  Chronic Left Si Joint Pain  Chronic Left Hip Pain  Chronic Pain Syndrome    Plan of Care   1. Chronic left hip pain - DG HIP UNILAT W OR W/O PELVIS 2-3 VIEWS LEFT; Future  2. Chronic left SI joint pain - DG Si Joints; Future  3. Primary osteoarthritis of left shoulder - HYDROcodone-acetaminophen (NORCO/VICODIN) 5-325 MG tablet; Take 1 tablet by mouth 2 (two) times daily as needed for moderate pain. For chronic pain syndrome  Dispense: 60 tablet; Refill: 0 - AMB referral to sports medicine  4. Chronic left shoulder pain - HYDROcodone-acetaminophen (NORCO/VICODIN) 5-325 MG tablet; Take 1 tablet by mouth 2 (two) times daily as needed for moderate pain. For chronic pain syndrome  Dispense: 60 tablet; Refill: 0 - AMB referral to sports medicine  5. Diabetic polyneuropathy associated with type 2 diabetes mellitus ([facility]) - HYDROcodone-acetaminophen (NORCO/VICODIN) 5-325 MG tablet; Take 1 tablet by mouth 2 (two) times daily as needed for moderate pain. For chronic pain syndrome  Dispense: 60 tablet; Refill: 0  6. Chronic pain syndrome - DG HIP UNILAT W OR W/O PELVIS 2-3 VIEWS LEFT; Future - DG Si Joints; Future - HYDROcodone-acetaminophen (NORCO/VICODIN) 5-325 MG tablet; Take 1 tablet by mouth 2 (two) times daily as needed for moderate pain. For chronic pain syndrome  Dispense: 60 tablet; Refill: 0  7. Type 2 diabetes mellitus with diabetic polyneuropathy, without long-term current use of insulin ([facility]) - gabapentin (NEURONTIN) 400 MG capsule; Take 3 capsules (1,200 mg total) by mouth at  bedtime.  Dispense: 270 capsule; Refill: 3  8. Carpal tunnel syndrome of right wrist - HYDROcodone-acetaminophen (NORCO/VICODIN) 5-325 MG tablet; Take 1 tablet by mouth 2 (two) times daily as needed for moderate pain. For chronic pain syndrome  Dispense: 60 tablet; Refill: 0     Pharmacotherapy (Medications Ordered): Meds ordered this encounter  Medications   gabapentin (NEURONTIN) 400 MG capsule    Sig: Take 3 capsules (  1,200 mg total) by mouth at bedtime.    Dispense:  270 capsule    Refill:  3   HYDROcodone-acetaminophen (NORCO/VICODIN) 5-325 MG tablet    Sig: Take 1 tablet by mouth 2 (two) times daily as needed for moderate pain. For chronic pain syndrome    Dispense:  60 tablet    Refill:  0   Orders:  Orders Placed This Encounter  Procedures   DG HIP UNILAT W OR W/O PELVIS 2-3 VIEWS LEFT    Please describe any evidence of DJD, such as joint narrowing, asymmetry, cysts, or any anomalies in bone density, production, or erosion.    Standing Status:   Future    Number of Occurrences:   1    Standing Expiration Date:   01/13/2022    Scheduling Instructions:     Imaging must be done as soon as possible. Inform patient that order will expire within 30 days and I will not renew it.    Order Specific Question:   Reason for Exam (SYMPTOM  OR DIAGNOSIS REQUIRED)    Answer:   Left hip pain/arthralgia    Order Specific Question:   Preferred imaging location?    Answer:   [facility]    Order Specific Question:   Call Results- Best Contact Number?    Answer:   [phone] ([facility])   DG Si Joints    Standing Status:   Future    Number of Occurrences:   1    Standing Expiration Date:   01/13/2022    Scheduling Instructions:     Imaging must be done as soon as possible. Inform patient that order will expire within 30 days and I will not renew it.    Order Specific Question:   Reason for Exam (SYMPTOM  OR DIAGNOSIS REQUIRED)    Answer:   Left hip pain/arthralgia     Order Specific Question:   Preferred imaging location?    Answer:   [facility]    Order Specific Question:   Call Results- Best Contact Number?    Answer:   [phone]) [phone] ([facility])    Order Specific Question:   Release to patient    Answer:   Immediate   AMB referral to sports medicine    Referral Priority:   Routine    Referral Type:   Consultation    Referred to Provider:   Montel Culver, MD    Number of Visits Requested:   1   Follow-up plan:   Return in about 3 months (around 03/19/2022) for Medication Management, in person.    Recent Visits Date Type Provider Dept  09/20/21 Office Visit Gillis Santa, MD [facility]  Showing recent visits within past 90 days and meeting all other requirements Today's Visits Date Type Provider Dept  12/13/21 Office Visit Gillis Santa, MD [facility]  Showing today's visits and meeting all other requirements Future Appointments No visits were found meeting these conditions. Showing future appointments within next 90 days and meeting all other requirements  I discussed the assessment and treatment plan with the patient. The patient was provided an opportunity to ask questions and all were answered. The patient agreed with the plan and demonstrated an understanding of the instructions.  Patient advised to call back or seek an in-person evaluation if the symptoms or condition worsens.  Duration of encounter: 8mnutes.  Note by: BGillis Santa MD Date: 12/13/2021; Time: 9:30 AM

## 2021-12-25 ENCOUNTER — Encounter: Payer: Self-pay | Admitting: Family Medicine

## 2021-12-25 ENCOUNTER — Ambulatory Visit (INDEPENDENT_AMBULATORY_CARE_PROVIDER_SITE_OTHER): Payer: PPO | Admitting: Family Medicine

## 2021-12-25 ENCOUNTER — Other Ambulatory Visit: Payer: Self-pay

## 2021-12-25 VITALS — BP 140/70 | HR 80 | Ht 74.5 in | Wt 171.0 lb

## 2021-12-25 DIAGNOSIS — M1612 Unilateral primary osteoarthritis, left hip: Secondary | ICD-10-CM | POA: Diagnosis not present

## 2021-12-25 DIAGNOSIS — M1611 Unilateral primary osteoarthritis, right hip: Secondary | ICD-10-CM | POA: Diagnosis not present

## 2021-12-25 DIAGNOSIS — M12812 Other specific arthropathies, not elsewhere classified, left shoulder: Secondary | ICD-10-CM | POA: Diagnosis not present

## 2021-12-25 DIAGNOSIS — M12819 Other specific arthropathies, not elsewhere classified, unspecified shoulder: Secondary | ICD-10-CM | POA: Insufficient documentation

## 2021-12-25 MED ORDER — MELOXICAM 7.5 MG PO TABS: 7.5000 mg | ORAL_TABLET | Freq: Every day | ORAL | 0 refills | Status: DC

## 2021-12-25 NOTE — Assessment & Plan Note (Signed)
Chronic condition with exacerbation. See additional assessment(s) for plan details.

## 2021-12-25 NOTE — Assessment & Plan Note (Addendum)
Patient with longstanding left anterolateral shoulder pain ongoing for years, denies any trauma in onset.  Pain with ADLs and activities (fishing).  Denies any radiation of symptoms to the neck or distally, no paresthesias.  Has had similar symptoms in the contralateral shoulder respond to a cortisone shot years prior.  Examination reveals full painful range of motion overhead, isolated rotator cuff testing elicits 5/5 strength with hip flexion rotation, there is pain, isolated supraspinatus testing reveals 4/5 strength, maximal pain, glenohumeral loading recreates symptomatology.  Equivocal Neer's, positive Hawkins, remainder of provocative testing benign.  Discussed treatment strategies, given his comorbid bilateral hip symptoms, he will be placed on short course of low-dose meloxicam and follow-up in 2 weeks for reevaluation.  If still symptomatic despite medications, can discuss escalation to ultrasound-guided intra-articular and/or subacromial corticosteroid injection.

## 2021-12-25 NOTE — Progress Notes (Signed)
°  ° °  Primary Care / Sports Medicine Office Visit  Patient Information:  Patient ID: Gregory Harrison, male DOB: Apr 21, 1951 Age: 71 y.o. MRN: [id number]   Gregory Harrison is a pleasant 71 y.o. male presenting with the following:  Chief Complaint  Patient presents with   New Patient (Initial Visit)   Shoulder Pain    Left   Hip Pain    Bilateral    Vitals:   12/25/21 1415  BP: 140/70  Pulse: 80  SpO2: 98%   Vitals:   12/25/21 1415  Weight: 171 lb (77.6 kg)  Height: 6' 2.5" (1.892 m)   Body mass index is 21.66 kg/m.     Independent interpretation of notes and tests performed by another provider:   Independent interpretation of AP pelvis and left hip x-rays dated 12/13/2021 reveal bilateral moderate to severe hip osteoarthritis, femoral acetabular osteophytes, no acute osseous processes noted  Procedures performed:   None  Pertinent History, Exam, Impression, and Recommendations:   Primary osteoarthritis of right hip Patient with several week history of left greater than right groin pain, denies any trauma or overuse but did experience back-to-back long car rides.  Pain does not radiate, aggravated by weightbearing, position change.  Examination shows acutely positive FADIR test symmetrically, FABER equivocal, nontender at the greater trochanteric regions bilaterally, equivocal seated straight leg raise.  Given his clinical history, examination findings, and radiographs, patient is experiencing left greater than right osteoarthritis related arthralgia.  Treatment strategies were reviewed, plan for very limited course of low-dose meloxicam 7.5 mg daily x2 weeks.  At his return if symptoms fail to adequately respond, plan for ultrasound-guided intra-articular cortisone injections.  If symptoms do show adequate response, home-based versus formal physical therapy to commence.  Chronic condition, exacerbation, radiographic review, medication management.  Primary osteoarthritis of  left hip Chronic condition with exacerbation. See additional assessment(s) for plan details.  Rotator cuff arthropathy, left Patient with longstanding left anterolateral shoulder pain ongoing for years, denies any trauma in onset.  Pain with ADLs and activities (fishing).  Denies any radiation of symptoms to the neck or distally, no paresthesias.  Has had similar symptoms in the contralateral shoulder respond to a cortisone shot years prior.  Examination reveals full painful range of motion overhead, isolated rotator cuff testing elicits 5/5 strength with hip flexion rotation, there is pain, isolated supraspinatus testing reveals 4/5 strength, maximal pain, glenohumeral loading recreates symptomatology.  Equivocal Neer's, positive Hawkins, remainder of provocative testing benign.  Discussed treatment strategies, given his comorbid bilateral hip symptoms, he will be placed on short course of low-dose meloxicam and follow-up in 2 weeks for reevaluation.  If still symptomatic despite medications, can discuss escalation to ultrasound-guided intra-articular and/or subacromial corticosteroid injection.   Orders & Medications Meds ordered this encounter  Medications   meloxicam (MOBIC) 7.5 MG tablet    Sig: Take 1 tablet (7.5 mg total) by mouth daily.    Dispense:  14 tablet    Refill:  0   No orders of the defined types were placed in this encounter.    Return in about 2 weeks (around 01/08/2022) for 40 minute visit.     Montel Culver, MD   Primary Care Sports Medicine [place]

## 2021-12-25 NOTE — Patient Instructions (Signed)
-   Take meloxicam once daily with food x2 weeks - Return for follow-up in 2 weeks

## 2021-12-25 NOTE — Assessment & Plan Note (Addendum)
Patient with several week history of left greater than right groin pain, denies any trauma or overuse but did experience back-to-back long car rides.  Pain does not radiate, aggravated by weightbearing, position change.  Examination shows acutely positive FADIR test symmetrically, FABER equivocal, nontender at the greater trochanteric regions bilaterally, equivocal seated straight leg raise.  Given his clinical history, examination findings, and radiographs, patient is experiencing left greater than right osteoarthritis related arthralgia.  Treatment strategies were reviewed, plan for very limited course of low-dose meloxicam 7.5 mg daily x2 weeks.  At his return if symptoms fail to adequately respond, plan for ultrasound-guided intra-articular cortisone injections.  If symptoms do show adequate response, home-based versus formal physical therapy to commence.  Chronic condition, exacerbation, radiographic review, medication management.

## 2022-01-08 ENCOUNTER — Encounter: Payer: Self-pay | Admitting: Family Medicine

## 2022-01-08 ENCOUNTER — Other Ambulatory Visit: Payer: Self-pay

## 2022-01-08 ENCOUNTER — Inpatient Hospital Stay: Payer: Self-pay | Admitting: Radiology

## 2022-01-08 ENCOUNTER — Ambulatory Visit (INDEPENDENT_AMBULATORY_CARE_PROVIDER_SITE_OTHER): Payer: PPO | Admitting: Family Medicine

## 2022-01-08 VITALS — BP 150/74 | HR 56 | Ht 74.5 in | Wt 179.0 lb

## 2022-01-08 DIAGNOSIS — M12812 Other specific arthropathies, not elsewhere classified, left shoulder: Secondary | ICD-10-CM

## 2022-01-08 DIAGNOSIS — M1612 Unilateral primary osteoarthritis, left hip: Secondary | ICD-10-CM | POA: Diagnosis not present

## 2022-01-08 DIAGNOSIS — M1611 Unilateral primary osteoarthritis, right hip: Secondary | ICD-10-CM

## 2022-01-08 MED ORDER — TRIAMCINOLONE ACETONIDE 40 MG/ML IJ SUSP
40.0000 mg | Freq: Once | INTRAMUSCULAR | Status: AC
  Administered 2022-01-08: 40 mg

## 2022-01-08 MED ORDER — MELOXICAM 7.5 MG PO TABS: 7.5000 mg | ORAL_TABLET | Freq: Every day | ORAL | 0 refills | Status: DC | PRN

## 2022-01-08 NOTE — Progress Notes (Signed)
Primary Care / Sports Medicine Office Visit  Patient Information:  Patient ID: Gregory Harrison, male DOB: [birth year] Age: 71 y.o. MRN: [id number]   Gregory Harrison is a pleasant 71 y.o. male presenting with the following:  Chief Complaint  Patient presents with   Hip Pain    Bilateral; denies pain in office   Rotator cuff arthropathy, left    Recent fall 01/03/2022 and landed on left side; 7/10 pain    Vitals:   01/08/22 1039  BP: (!) 150/74  Pulse: (!) 56  SpO2: 98%   Vitals:   01/08/22 1039  Weight: 179 lb (81.2 kg)  Height: 6' 2.5" (1.892 m)   Body mass index is 22.67 kg/m.     Independent interpretation of notes and tests performed by another provider:   None  Procedures performed:   Procedure:  Injection of left subacromial space under ultrasound guidance. Ultrasound guidance utilized for visualization of the left subacromial space, hypoechoic region throughout the supraspinatus most consistent with tendinosis/degenerative changes, hypoechoic response from injectate noted Samsung HS60 device utilized with permanent recording / reporting. Verbal informed consent obtained and verified. Skin prepped in a sterile fashion. Ethyl chloride for topical local analgesia.  Completed without difficulty and tolerated well. Medication: triamcinolone acetonide 40 mg/mL suspension for injection 1 mL total and 2 mL lidocaine 1% without epinephrine utilized for needle placement anesthetic Advised to contact for fevers/chills, erythema, induration, drainage, or persistent bleeding.   Pertinent History, Exam, Impression, and Recommendations:   Primary osteoarthritis of right hip Patient presents for follow-up to bilateral hip osteoarthritis related arthralgia.  At his last visit with [place] on 12/25/2021, he was noted to have left greater than right osteoarthritis related arthralgia, plan for initial scheduled low-dose meloxicam 7.5 mg x 2 weeks.  Patient states that he has tolerated  the medication well, has noted immediate and essential total resolution of hip symptoms, this is in the setting of high daily physical activity.  His findings today in clinic are consistent with the same.  Given his excellent response I have advised both a home based rehabilitation program to focus on the hips, [facility] hip conditioning program materials provided today, and a medication change from scheduled meloxicam 7.5 to as needed dosing.  We will plan for a 42-month follow-up to assess his need of meloxicam, if consistent, from a risk stratification standpoint, can consider intra-articular hip injection to negate the need for as needed meloxicam.  Chronic condition, stable, medication management.  Primary osteoarthritis of left hip Patient presents for follow-up to left hip osteoarthritis related arthralgia, this was noted during his initial visit with [place] on 12/25/2021.  Over the interim he has tolerated of meloxicam 7.5 mg daily and noted immediate total resolution of hip pain.  His findings today are consistent with the same.  From a risk stratification standpoint, I have advised a transition with medication management from scheduled to as needed dosing, #30 tablets written for, and plan for start of home-based rehab. See additional assessment(s) for plan details.  Rotator cuff arthropathy, left Patient presents for follow-up to longstanding, years, atraumatic left anterolateral shoulder pain that was interfering with his ADLs.  During his last visit with [place] on 12/25/2021, his findings are most consistent to rotator cuff related arthropathy with focality to the supraspinatus and glenohumeral articulation.  Plan for scheduled meloxicam 7.5 mg x 2 weeks and close follow-up.  Patient states that he has noted mild improvement in his symptoms though his shoulder continues  to interfere with ADLs.  Examination today reveals now negative glenohumeral loading, continued pain localizing to the supraspinatus with  positive impingement.  His clinical features are consistent with improvement over the osteoarthritis component of his left shoulder pain with persistent focality to the rotator cuff related impingement.  Given that he has been dosing meloxicam, we discussed additional treatment strategies, and he did elect to proceed with ultrasound-guided subacromial corticosteroid injection.  Post care reviewed and he is to start a home-based rehab program with materials from the [facility].  Additionally, he can continue meloxicam on an as-needed basis and I like him to return in 2 months for reevaluation.  Chronic condition, uncontrolled, Rx management   Orders & Medications Meds ordered this encounter  Medications   meloxicam (MOBIC) 7.5 MG tablet    Sig: Take 1 tablet (7.5 mg total) by mouth daily as needed for pain.    Dispense:  30 tablet    Refill:  0   triamcinolone acetonide (KENALOG-40) injection 40 mg   Orders Placed This Encounter  Procedures   [place] LIMITED JOINT SPACE STRUCTURES UP LEFT     Return in about 2 months (around 03/08/2022).     Montel Culver, MD   Primary Care Sports Medicine [place]

## 2022-01-08 NOTE — Assessment & Plan Note (Signed)
Patient presents for follow-up to bilateral hip osteoarthritis related arthralgia.  At his last visit with [place] on 12/25/2021, he was noted to have left greater than right osteoarthritis related arthralgia, plan for initial scheduled low-dose meloxicam 7.5 mg x 2 weeks.  Patient states that he has tolerated the medication well, has noted immediate and essential total resolution of hip symptoms, this is in the setting of high daily physical activity.  His findings today in clinic are consistent with the same.  Given his excellent response I have advised both a home based rehabilitation program to focus on the hips, [facility] hip conditioning program materials provided today, and a medication change from scheduled meloxicam 7.5 to as needed dosing.  We will plan for a 26-month follow-up to assess his need of meloxicam, if consistent, from a risk stratification standpoint, can consider intra-articular hip injection to negate the need for as needed meloxicam.  Chronic condition, stable, medication management.

## 2022-01-08 NOTE — Patient Instructions (Signed)
You have just been given a cortisone injection to reduce pain and inflammation. After the injection you may notice immediate relief of pain as a result of the Lidocaine. It is important to rest the area of the injection for 24 to 48 hours after the injection. There is a possibility of some temporary increased discomfort and swelling for up to 72 hours until the cortisone begins to work. If you do have pain, simply rest the joint and use ice. If you can tolerate over the counter medications, you can try Tylenol, Aleve, or Advil for added relief per package instructions. - As above, relative rest for the next 2 days, then gradual return to full activity - Transition to daily as needed dosing of meloxicam, use sparingly - Start home exercises with information provided for both the shoulder and hips - Return for follow-up in 2 months, contact us for any questions between now and then

## 2022-01-08 NOTE — Assessment & Plan Note (Signed)
Patient presents for follow-up to left hip osteoarthritis related arthralgia, this was noted during his initial visit with [place] on 12/25/2021.  Over the interim he has tolerated of meloxicam 7.5 mg daily and noted immediate total resolution of hip pain.  His findings today are consistent with the same.  From a risk stratification standpoint, I have advised a transition with medication management from scheduled to as needed dosing, #30 tablets written for, and plan for start of home-based rehab. See additional assessment(s) for plan details.

## 2022-01-08 NOTE — Assessment & Plan Note (Addendum)
Patient presents for follow-up to longstanding, years, atraumatic left anterolateral shoulder pain that was interfering with his ADLs.  During his last visit with [place] on 12/25/2021, his findings are most consistent to rotator cuff related arthropathy with focality to the supraspinatus and glenohumeral articulation.  Plan for scheduled meloxicam 7.5 mg x 2 weeks and close follow-up.  Patient states that he has noted mild improvement in his symptoms though his shoulder continues to interfere with ADLs.  Examination today reveals now negative glenohumeral loading, continued pain localizing to the supraspinatus with positive impingement.  His clinical features are consistent with improvement over the osteoarthritis component of his left shoulder pain with persistent focality to the rotator cuff related impingement.  Given that he has been dosing meloxicam, we discussed additional treatment strategies, and he did elect to proceed with ultrasound-guided subacromial corticosteroid injection.  Post care reviewed and he is to start a home-based rehab program with materials from the [facility].  Additionally, he can continue meloxicam on an as-needed basis and I like him to return in 2 months for reevaluation.  Chronic condition, uncontrolled, Rx management

## 2022-02-01 ENCOUNTER — Telehealth: Payer: Self-pay | Admitting: *Deleted

## 2022-02-01 NOTE — Telephone Encounter (Signed)
Patient called back, asking for Hydrocodone script to be sent to [facility]. Note sent to Dr. Holley Raring.

## 2022-02-01 NOTE — Telephone Encounter (Signed)
Patient called because [facility] does not have Hydrocodone on stock. I called to verify, and cancelled the current prescription. Called patient back to ask what pharmacy to send new script to. Message left.

## 2022-02-04 ENCOUNTER — Other Ambulatory Visit: Payer: Self-pay | Admitting: Student in an Organized Health Care Education/Training Program

## 2022-02-04 ENCOUNTER — Telehealth: Payer: Self-pay | Admitting: Student in an Organized Health Care Education/Training Program

## 2022-02-04 DIAGNOSIS — G5601 Carpal tunnel syndrome, right upper limb: Secondary | ICD-10-CM

## 2022-02-04 DIAGNOSIS — E1142 Type 2 diabetes mellitus with diabetic polyneuropathy: Secondary | ICD-10-CM

## 2022-02-04 DIAGNOSIS — M19012 Primary osteoarthritis, left shoulder: Secondary | ICD-10-CM

## 2022-02-04 DIAGNOSIS — G8929 Other chronic pain: Secondary | ICD-10-CM

## 2022-02-04 DIAGNOSIS — G894 Chronic pain syndrome: Secondary | ICD-10-CM

## 2022-02-04 DIAGNOSIS — M25512 Pain in left shoulder: Secondary | ICD-10-CM

## 2022-02-04 MED ORDER — HYDROCODONE-ACETAMINOPHEN 5-325 MG PO TABS: 1.0000 | ORAL_TABLET | Freq: Two times a day (BID) | ORAL | 0 refills | Status: DC | PRN

## 2022-02-04 NOTE — Telephone Encounter (Signed)
Patient notified per voicemail that prescription has been sent to the [facility] requested.

## 2022-02-04 NOTE — Telephone Encounter (Addendum)
Patient says [facility] does not have his pain meds Needs sent to [facility] [address], [place]

## 2022-02-05 ENCOUNTER — Other Ambulatory Visit: Payer: Self-pay | Admitting: Family Medicine

## 2022-02-05 DIAGNOSIS — M12812 Other specific arthropathies, not elsewhere classified, left shoulder: Secondary | ICD-10-CM

## 2022-02-05 DIAGNOSIS — M1612 Unilateral primary osteoarthritis, left hip: Secondary | ICD-10-CM

## 2022-02-05 DIAGNOSIS — M1611 Unilateral primary osteoarthritis, right hip: Secondary | ICD-10-CM

## 2022-02-05 NOTE — Telephone Encounter (Signed)
Requested medication (s) are due for refill today: yes  Requested medication (s) are on the active medication list: yes  Last refill:  01/08/21 #30 with 0 RF  Future visit scheduled: 03/07/22  Notes to clinic: patient seen for visits on 01/08/22, med previously prescribed by Dr. Zigmund Daniel, labs out of date, please assess.   Requested Prescriptions  Pending Prescriptions Disp Refills   meloxicam (MOBIC) 7.5 MG tablet [Pharmacy Med Name: MELOXICAM 7.5 MG TABLET] 30 tablet 0    Sig: TAKE 1 TABLET BY MOUTH DAILY AS NEEDED FOR PAIN     Analgesics:  COX2 Inhibitors Failed - 02/05/2022  1:50 AM      Failed - Manual Review: Labs are only required if the patient has taken medication for more than 8 weeks.      Failed - HGB in normal range and within 360 days    Hemoglobin  Date Value Ref Range Status  07/15/2019 14.4 13.0 - 17.7 g/dL Final          Failed - HCT in normal range and within 360 days    Hematocrit  Date Value Ref Range Status  07/15/2019 44.1 37.5 - 51.0 % Final          Failed - AST in normal range and within 360 days    AST  Date Value Ref Range Status  07/15/2019 16 0 - 40 IU/L Final          Failed - ALT in normal range and within 360 days    ALT  Date Value Ref Range Status  07/15/2019 20 0 - 44 IU/L Final          Passed - Cr in normal range and within 360 days    Creatinine, Ser  Date Value Ref Range Status  02/13/2021 0.97 0.76 - 1.27 mg/dL Final   Creatinine, POC  Date Value Ref Range Status  03/09/2018 50 mg/dL Final          Passed - eGFR is 30 or above and within 360 days    GFR calc Af Amer  Date Value Ref Range Status  10/19/2020 88 >59 mL/min/1.73 Final    Comment:    **In accordance with recommendations from the NKF-ASN Task force,**   [facility] is in the process of updating its eGFR calculation to the   2021 CKD-EPI creatinine equation that estimates kidney function   without a race variable.    GFR calc non Af Amer  Date Value Ref  Range Status  10/19/2020 76 >59 mL/min/1.73 Final   eGFR  Date Value Ref Range Status  02/13/2021 83 >59 mL/min/1.73 Final          Passed - Patient is not pregnant      Passed - Valid encounter within last 12 months    Recent Outpatient Visits           4 weeks ago Rotator cuff arthropathy, left   [facility] Montel Culver, MD   1 month ago Primary osteoarthritis of right hip   [facility] Montel Culver, MD       Future Appointments             In 1 month Zigmund Daniel, Earley Abide, MD [facility], [facility]

## 2022-02-24 ENCOUNTER — Other Ambulatory Visit: Payer: Self-pay | Admitting: Family Medicine

## 2022-02-24 DIAGNOSIS — E7800 Pure hypercholesterolemia, unspecified: Secondary | ICD-10-CM

## 2022-02-25 ENCOUNTER — Other Ambulatory Visit: Payer: Self-pay | Admitting: Family Medicine

## 2022-02-25 DIAGNOSIS — M1611 Unilateral primary osteoarthritis, right hip: Secondary | ICD-10-CM

## 2022-02-25 DIAGNOSIS — M12812 Other specific arthropathies, not elsewhere classified, left shoulder: Secondary | ICD-10-CM

## 2022-02-25 DIAGNOSIS — M1612 Unilateral primary osteoarthritis, left hip: Secondary | ICD-10-CM

## 2022-02-27 NOTE — Telephone Encounter (Addendum)
Requested medications are due for refill today.  yes ? ?Requested medications are on the active medications list.  yes ? ?Last refill. 02/06/2022 #30 0 ? ?Future visit scheduled.   yes ? ?Notes to clinic.  Labs are expired. Pcp listed as Dr. Volanda Napoleon.  ? ? ? ?Requested Prescriptions  ?Pending Prescriptions Disp Refills  ? meloxicam (MOBIC) 7.5 MG tablet [Pharmacy Med Name: MELOXICAM 7.5 MG TABLET] 30 tablet 0  ?  Sig: TAKE 1 TABLET BY MOUTH EVERY DAY AS NEEDED FOR PAIN  ?  ? Analgesics:  COX2 Inhibitors Failed - 02/25/2022  2:23 PM  ?  ?  Failed - Manual Review: Labs are only required if the patient has taken medication for more than 8 weeks.  ?  ?  Failed - HGB in normal range and within 360 days  ?  Hemoglobin  ?Date Value Ref Range Status  ?07/15/2019 14.4 13.0 - 17.7 g/dL Final  ?  ?  ?  ?  Failed - Cr in normal range and within 360 days  ?  Creatinine, Ser  ?Date Value Ref Range Status  ?02/13/2021 0.97 0.76 - 1.27 mg/dL Final  ? ?Creatinine, POC  ?Date Value Ref Range Status  ?03/09/2018 50 mg/dL Final  ?  ?  ?  ?  Failed - HCT in normal range and within 360 days  ?  Hematocrit  ?Date Value Ref Range Status  ?07/15/2019 44.1 37.5 - 51.0 % Final  ?  ?  ?  ?  Failed - AST in normal range and within 360 days  ?  AST  ?Date Value Ref Range Status  ?07/15/2019 16 0 - 40 IU/L Final  ?  ?  ?  ?  Failed - ALT in normal range and within 360 days  ?  ALT  ?Date Value Ref Range Status  ?07/15/2019 20 0 - 44 IU/L Final  ?  ?  ?  ?  Failed - eGFR is 30 or above and within 360 days  ?  GFR calc Af Amer  ?Date Value Ref Range Status  ?10/19/2020 88 >59 mL/min/1.73 Final  ?  Comment:  ?  **In accordance with recommendations from the NKF-ASN Task force,** ?  [facility] is in the process of updating its eGFR calculation to the ?  2021 CKD-EPI creatinine equation that estimates kidney function ?  without a race variable. ?  ? ?GFR calc non Af Amer  ?Date Value Ref Range Status  ?10/19/2020 76 >59 mL/min/1.73 Final  ? ?eGFR  ?Date Value  Ref Range Status  ?02/13/2021 83 >59 mL/min/1.73 Final  ?  ?  ?  ?  Passed - Patient is not pregnant  ?  ?  Passed - Valid encounter within last 12 months  ?  Recent Outpatient Visits   ? ?      ? 1 month ago Rotator cuff arthropathy, left  ? [facility] Montel Culver, MD  ? 2 months ago Primary osteoarthritis of right hip  ? [facility] Montel Culver, MD  ? ?  ?  ?Future Appointments   ? ?        ? In 1 week Zigmund Daniel Earley Abide, MD [facility], [place]  ? ?  ? ?  ?  ?  ?  ?

## 2022-03-04 ENCOUNTER — Telehealth: Payer: Self-pay | Admitting: Student in an Organized Health Care Education/Training Program

## 2022-03-04 ENCOUNTER — Other Ambulatory Visit: Payer: Self-pay | Admitting: *Deleted

## 2022-03-04 DIAGNOSIS — E1142 Type 2 diabetes mellitus with diabetic polyneuropathy: Secondary | ICD-10-CM

## 2022-03-04 DIAGNOSIS — M19012 Primary osteoarthritis, left shoulder: Secondary | ICD-10-CM

## 2022-03-04 DIAGNOSIS — G5601 Carpal tunnel syndrome, right upper limb: Secondary | ICD-10-CM

## 2022-03-04 DIAGNOSIS — G8929 Other chronic pain: Secondary | ICD-10-CM

## 2022-03-04 DIAGNOSIS — G894 Chronic pain syndrome: Secondary | ICD-10-CM

## 2022-03-04 MED ORDER — HYDROCODONE-ACETAMINOPHEN 5-325 MG PO TABS: 1.0000 | ORAL_TABLET | Freq: Two times a day (BID) | ORAL | 0 refills | Status: DC | PRN

## 2022-03-04 NOTE — Telephone Encounter (Signed)
Called to let patient know that hydrocodone - apap 5 - 325 mg has been sent in.  ?

## 2022-03-07 ENCOUNTER — Encounter: Payer: Self-pay | Admitting: Family Medicine

## 2022-03-07 ENCOUNTER — Ambulatory Visit (INDEPENDENT_AMBULATORY_CARE_PROVIDER_SITE_OTHER): Payer: PPO | Admitting: Family Medicine

## 2022-03-07 DIAGNOSIS — M12812 Other specific arthropathies, not elsewhere classified, left shoulder: Secondary | ICD-10-CM

## 2022-03-07 DIAGNOSIS — M1612 Unilateral primary osteoarthritis, left hip: Secondary | ICD-10-CM | POA: Diagnosis not present

## 2022-03-07 DIAGNOSIS — M1611 Unilateral primary osteoarthritis, right hip: Secondary | ICD-10-CM

## 2022-03-07 MED ORDER — MELOXICAM 7.5 MG PO TABS: 7.5000 mg | ORAL_TABLET | Freq: Every day | ORAL | 2 refills | Status: DC | PRN

## 2022-03-07 NOTE — Progress Notes (Signed)
?  ? ?  Primary Care / Sports Medicine Office Visit ? ?Patient Information:  ?Patient ID: Gregory Harrison, male DOB: 1951-10-17 Age: 71 y.o. MRN: [id number]  ? ?Gregory Harrison is a pleasant 71 y.o. male presenting with the following: ? ?Chief Complaint  ?Patient presents with  ? Hip Pain  ?  Bilateral Pt states hips are better since being on medication  ? Shoulder Pain  ?  Left,states that it is feeling better with medication.   ? ? ?Vitals:  ? 03/07/22 1040  ?BP: (!) 142/88  ?Pulse: 78  ?SpO2: 98%  ? ?Vitals:  ? 03/07/22 1040  ?Weight: 178 lb (80.7 kg)  ?Height: 6' 2.5" (1.892 m)  ? ?Body mass index is 22.55 kg/m?. ? ?No results found.  ? ?Independent interpretation of notes and tests performed by another provider:  ? ?None ? ?Procedures performed:  ? ?None ? ?Pertinent History, Exam, Impression, and Recommendations:  ? ?Rotator cuff arthropathy, left ?Patient returns for follow-up to left chronic shoulder pain in the setting of rotator cuff arthropathy. At last visit on 01/08/2022, given his persistent symptoms, he did proceed with subacromial corticosteroid injection to the left shoulder. ? ?He presents today stating that pain has completely resolved, has been able to perform his usual pleasurable physical activities such as fishing without any recurrence of shoulder pain.  He does dose meloxicam rather infrequently, specifically having utilized roughly 12 tablets over the past 2 months.  His examination today reveals painless rotator cuff testing with preserved strength, glenohumeral loading benign. ? ?Given his excellent progress, I have advised patient to continue to maintain active lifestyle, can utilize meloxicam sparingly as he has been with medication management reviewed and ultimate goal of weaning/discontinuing this medication.  He can follow-up on an as-needed basis. ? ?Chronic condition, stable, Rx management ? ?Primary osteoarthritis of right hip ?Patient also returns for follow-up to bilateral hip pain in  the setting of underlying osteoarthritis.  At his last visit with [place] on 01/08/2022 he had been demonstrating excellent response to meloxicam 7.5 mg, was provided with home-based exercises, and transition to as needed dosing of meloxicam. ? ?Since that time he has been able to maintain an active physical lifestyle, fishing without any issues and performing his ADLs without any recurrence of hip pain.  From an examination standpoint he does have limited range of motion though FADIR and FABER testing are benign, nontender throughout the hip, strength preserved. ? ?Given his excellent interval progress and symptom control, I have advised continued activity from a physical standpoint, home exercises, continued meloxicam 7.5 mg dosed sparingly with ultimate goal to wean and discontinue this medication. ? ?Chronic condition, stable, medication management ? ?Primary osteoarthritis of left hip ?See additional assessment(s) for plan details.  ? ?Orders & Medications ?Meds ordered this encounter  ?Medications  ? meloxicam (MOBIC) 7.5 MG tablet  ?  Sig: Take 1 tablet (7.5 mg total) by mouth daily as needed. for pain  ?  Dispense:  30 tablet  ?  Refill:  2  ? ?No orders of the defined types were placed in this encounter. ?  ? ?Return if symptoms worsen or fail to improve.  ?  ? ?Montel Culver, MD ? ? Primary Care Sports Medicine ?[facility] ?[place]  ? ?

## 2022-03-07 NOTE — Assessment & Plan Note (Signed)
Patient returns for follow-up to left chronic shoulder pain in the setting of rotator cuff arthropathy. At last visit on 01/08/2022, given his persistent symptoms, he did proceed with subacromial corticosteroid injection to the left shoulder. ? ?He presents today stating that pain has completely resolved, has been able to perform his usual pleasurable physical activities such as fishing without any recurrence of shoulder pain.  He does dose meloxicam rather infrequently, specifically having utilized roughly 12 tablets over the past 2 months.  His examination today reveals painless rotator cuff testing with preserved strength, glenohumeral loading benign. ? ?Given his excellent progress, I have advised patient to continue to maintain active lifestyle, can utilize meloxicam sparingly as he has been with medication management reviewed and ultimate goal of weaning/discontinuing this medication.  He can follow-up on an as-needed basis. ? ?Chronic condition, stable, Rx management ?

## 2022-03-07 NOTE — Assessment & Plan Note (Signed)
See additional assessment(s) for plan details. 

## 2022-03-07 NOTE — Assessment & Plan Note (Signed)
Patient also returns for follow-up to bilateral hip pain in the setting of underlying osteoarthritis.  At his last visit with [place] on 01/08/2022 he had been demonstrating excellent response to meloxicam 7.5 mg, was provided with home-based exercises, and transition to as needed dosing of meloxicam. ? ?Since that time he has been able to maintain an active physical lifestyle, fishing without any issues and performing his ADLs without any recurrence of hip pain.  From an examination standpoint he does have limited range of motion though FADIR and FABER testing are benign, nontender throughout the hip, strength preserved. ? ?Given his excellent interval progress and symptom control, I have advised continued activity from a physical standpoint, home exercises, continued meloxicam 7.5 mg dosed sparingly with ultimate goal to wean and discontinue this medication. ? ?Chronic condition, stable, medication management ?

## 2022-03-07 NOTE — Patient Instructions (Signed)
-   Continue with meloxicam on an as needed basis, use sparingly for shoulder and/or hip pain (take with food) ?- Continue with regular physical activity as you have been ?- Contact us for any possible return of symptoms at the shoulder and/or hips ?- Follow-up as needed ?

## 2022-03-11 ENCOUNTER — Ambulatory Visit (INDEPENDENT_AMBULATORY_CARE_PROVIDER_SITE_OTHER): Payer: PPO | Admitting: Family Medicine

## 2022-03-11 ENCOUNTER — Encounter: Payer: Self-pay | Admitting: Family Medicine

## 2022-03-11 VITALS — BP 147/84 | HR 62 | Ht 74.5 in | Wt 180.0 lb

## 2022-03-11 DIAGNOSIS — E1142 Type 2 diabetes mellitus with diabetic polyneuropathy: Secondary | ICD-10-CM | POA: Diagnosis not present

## 2022-03-11 DIAGNOSIS — E7800 Pure hypercholesterolemia, unspecified: Secondary | ICD-10-CM

## 2022-03-11 DIAGNOSIS — I10 Essential (primary) hypertension: Secondary | ICD-10-CM | POA: Diagnosis not present

## 2022-03-11 DIAGNOSIS — L989 Disorder of the skin and subcutaneous tissue, unspecified: Secondary | ICD-10-CM

## 2022-03-11 LAB — POCT GLYCOSYLATED HEMOGLOBIN (HGB A1C): HbA1c, POC (controlled diabetic range): 10.5 % — AB (ref 0.0–7.0)

## 2022-03-11 NOTE — Patient Instructions (Addendum)
Thank you for coming to see me today. It was a pleasure. ? ?We will get some labs today.  If they are abnormal or we need to do something about them, I will call you.  If they are normal, I will send you a message on MyChart (if it is active) or a letter in the mail.  If you don't hear from [place] in 2 weeks, please call the office at the number below.  ? ?Referral sent to Dermatology for evaluation of the lesion on your left hand.  Do no pick at the area. ? ?Your hemoglobin A1c was 10.5.  Increase your Lantus to 34 units daily.  Follow-up with PCP in 2 to 3 weeks.  If your blood glucose is less than 70 eat sugar candy, if less than 60 go to the emergency room.  If your blood sugar is greater than 400 go to the emergency department. ? ?Your blood pressure remains elevated at 147/84.  We will start Amlodipine 2.5 mg daily.  Continue losartan 25 mg daily and hydrochlorothiazide 12.5 mg daily.  If you have any dizziness, weakness, chest pain or shortness of breath please contact your MD or go to the emergency department. ? ?Please follow-up with PCP in 2 to 3 weeks ? ?If you have any questions or concerns, please do not hesitate to call the office at [phone]. ? ?Best,  ? ?[name], MD   ?

## 2022-03-11 NOTE — Progress Notes (Signed)
? ? ?  SUBJECTIVE:  ? ?CHIEF COMPLAINT / HPI: diabetes, blood pressure check up ? ?DM Type 2 ?Compliant with medications. Takes Lantus 32 u daily, Metformin 500 mg BID, Jardiance 25 mg daily.  Tolerating meds well.  Reports blood glucose at home 160s-180s, sometimes in 200's but not as much as previously.  Denies any visual disturbances, headaches, shortness of breath, chest pain, weight loss or gain, polyuria, abdominal pain, nausea or vomiting.  Doesn't watch diet and still enjoys sweets when in the mood.   ? ?HTN ?Does not check BP at home.  Compliant with medications Losartan 50 mg daily and HCTZ 12.5 mg daily.  Denies any dizziness, weakness, headaches, shortness or breath, chest pain or lower extremity edema. ? ?Lesion ?Reports lesion on back of hand for about 1 year.  Has gotten bigger.  Not painful but endorses that it is uncomfortable when putting hand in pocket and snags it.  Has been picking at lesion and noted to have intermittent bleeding.  Tried hydrogen peroxide, OTC wart medication that did not help.  Reports has just gotten bigger.  Denies any recent weight loss, decrease in appetite and is not aware of any similar lesions elsewhere.  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 ? ?PERTINENT  PMH / PSH:  ?DM Type 2 ?HTN ?HLD ?Chronic Pain ? ?OBJECTIVE:  ? ?BP (!) 147/84   Pulse 62   Ht 6' 2.5" (1.892 m)   Wt 180 lb (81.6 kg)   SpO2 98%   BMI 22.80 kg/m?   ? ?General: Alert, no acute distress ?Left Hand: 2x1 cm firm mobile nontender raised lesion with scaling and scabbing noted between 1st and 2nd digit on dorsum of left hand.  No erythema, edema, drainage appreciated.  FROM ?NVS intact ?sensation, motor intact ?strength 5/5 in all digits  ? ? ?Cardio: Normal S1 and S2, RRR, no r/m/g ?Pulm: CTAB, normal work of breathing ?Abdomen: Bowel sounds normal. Abdomen soft and non-tender.  ?Extremities: No peripheral edema.  ? ?ASSESSMENT/PLAN:  ? ?Type 2 diabetes mellitus with diabetic  polyneuropathy, without long-term current use of insulin ([place]) ?Asymptomatic and no hypoglycemic events. ?A1c 10.5 today, has been rising in the past 3 visits ?Increase Lantus 34 u daily ?Continue Jardiance 25 mg daily ?Continue Metformin 500 mg BID ?Continue Gabapentin  ?Follow up in 2 weeks  ?Strict return precautions provided ? ?HTN (hypertension) ?Goal 130/80.  Continues to be above goal.  Compliant with medications.  Denies any dizziness, weakness. ?-Continue Losartan 25 mg daily ?-Continue HCTZ 12.5 mg daily ?-Start Amlodipine 2.5 mg daily ?-Bmet today ?-Follow up in 2 weeks ?-Strict return precautions provided ? ?Diabetic neuropathy ([place]) ?Continue Gabapentin ? ?Hypercholesterolemia ?Lipid panel today ?Continue Lipitor 40 mg daily ? ? ?Skin lesion of hand ?Firm nontender mobile raised lesion that has grown in size in the past year per patient.  Concern for malignancy and will need biopsy.   ?Urgent referral to dermatology for evaluation ?  ? ? ?[name], MD ?[place]  ?

## 2022-03-12 ENCOUNTER — Encounter: Payer: Self-pay | Admitting: Family Medicine

## 2022-03-12 DIAGNOSIS — L989 Disorder of the skin and subcutaneous tissue, unspecified: Secondary | ICD-10-CM | POA: Insufficient documentation

## 2022-03-12 HISTORY — DX: Disorder of the skin and subcutaneous tissue, unspecified: L98.9

## 2022-03-12 LAB — BASIC METABOLIC PANEL
BUN/Creatinine Ratio: 11 (ref 10–24)
BUN: 12 mg/dL (ref 8–27)
CO2: 23 mmol/L (ref 20–29)
Calcium: 9.3 mg/dL (ref 8.6–10.2)
Chloride: 106 mmol/L (ref 96–106)
Creatinine, Ser: 1.09 mg/dL (ref 0.76–1.27)
Glucose: 141 mg/dL — ABNORMAL HIGH (ref 70–99)
Potassium: 4.5 mmol/L (ref 3.5–5.2)
Sodium: 144 mmol/L (ref 134–144)
eGFR: 72 mL/min/{1.73_m2} (ref >59)

## 2022-03-12 LAB — LIPID PANEL
Chol/HDL Ratio: 2.5 ratio (ref 0.0–5.0)
Cholesterol, Total: 102 mg/dL (ref 100–199)
HDL: 41 mg/dL (ref >39)
LDL Chol Calc (NIH): 41 mg/dL (ref 0–99)
Triglycerides: 107 mg/dL (ref 0–149)
VLDL Cholesterol Cal: 20 mg/dL (ref 5–40)

## 2022-03-12 MED ORDER — BASAGLAR KWIKPEN 100 UNIT/ML ~~LOC~~ SOPN: 34.0000 [IU] | PEN_INJECTOR | Freq: Every day | SUBCUTANEOUS | Status: DC

## 2022-03-12 MED ORDER — AMLODIPINE BESYLATE 2.5 MG PO TABS: 2.5000 mg | ORAL_TABLET | Freq: Every day | ORAL | 3 refills | Status: DC

## 2022-03-12 NOTE — Assessment & Plan Note (Signed)
Firm nontender mobile raised lesion that has grown in size in the past year per patient.  Concern for malignancy and will need biopsy.   ?Urgent referral to dermatology for evaluation ?

## 2022-03-12 NOTE — Assessment & Plan Note (Signed)
Asymptomatic and no hypoglycemic events. ?A1c 10.5 today, has been rising in the past 3 visits ?Increase Lantus 34 u daily ?Continue Jardiance 25 mg daily ?Continue Metformin 500 mg BID ?Continue Gabapentin  ?Follow up in 2 weeks  ?Strict return precautions provided ?

## 2022-03-12 NOTE — Assessment & Plan Note (Signed)
Lipid panel today -Continue Lipitor 40 mg daily 

## 2022-03-12 NOTE — Assessment & Plan Note (Addendum)
Goal 130/80.  Continues to be above goal.  Compliant with medications.  Denies any dizziness, weakness. ?-Continue Losartan 25 mg daily ?-Continue HCTZ 12.5 mg daily ?-Start Amlodipine 2.5 mg daily ?-Bmet today ?-Follow up in 2 weeks ?-Strict return precautions provided ?

## 2022-03-12 NOTE — Assessment & Plan Note (Signed)
Continue Gabapentin

## 2022-03-14 ENCOUNTER — Other Ambulatory Visit: Payer: Self-pay

## 2022-03-14 ENCOUNTER — Encounter: Payer: Self-pay | Admitting: Student in an Organized Health Care Education/Training Program

## 2022-03-14 ENCOUNTER — Ambulatory Visit
Payer: PPO | Attending: Student in an Organized Health Care Education/Training Program | Admitting: Student in an Organized Health Care Education/Training Program

## 2022-03-14 VITALS — BP 185/102 | HR 60 | Temp 97.2°F | Resp 16 | Ht 74.0 in | Wt 173.0 lb

## 2022-03-14 DIAGNOSIS — G5601 Carpal tunnel syndrome, right upper limb: Secondary | ICD-10-CM | POA: Diagnosis not present

## 2022-03-14 DIAGNOSIS — M25512 Pain in left shoulder: Secondary | ICD-10-CM | POA: Diagnosis not present

## 2022-03-14 DIAGNOSIS — G8929 Other chronic pain: Secondary | ICD-10-CM | POA: Diagnosis present

## 2022-03-14 DIAGNOSIS — G894 Chronic pain syndrome: Secondary | ICD-10-CM

## 2022-03-14 DIAGNOSIS — M19012 Primary osteoarthritis, left shoulder: Secondary | ICD-10-CM | POA: Insufficient documentation

## 2022-03-14 DIAGNOSIS — E1142 Type 2 diabetes mellitus with diabetic polyneuropathy: Secondary | ICD-10-CM | POA: Diagnosis not present

## 2022-03-14 MED ORDER — HYDROCODONE-ACETAMINOPHEN 5-325 MG PO TABS: 1.0000 | ORAL_TABLET | Freq: Two times a day (BID) | ORAL | 0 refills | Status: DC | PRN

## 2022-03-14 MED ORDER — GABAPENTIN 400 MG PO CAPS: 1200.0000 mg | ORAL_CAPSULE | Freq: Every day | ORAL | 3 refills | Status: DC

## 2022-03-14 NOTE — Progress Notes (Signed)
PROVIDER NOTE: Information contained herein reflects review and annotations entered in association with encounter. Interpretation of such information and data should be left to medically-trained personnel. Information provided to patient can be located elsewhere in the medical record under "Patient Instructions". Document created using STT-dictation technology, any transcriptional errors that may result from process are unintentional.  ?  ?Patient: Gregory Harrison  Service Category: E/M  Provider: Gillis Santa, MD  ?DOB: [birth year]  DOS: 03/14/2022  Specialty: Interventional Pain Management  ?MRN: [id number]  Setting: Ambulatory outpatient  PCP: [name], MD  ?Type: Established Patient    Referring Provider: [name], MD  ?Location: Office  Delivery: Face-to-face    ? ?HPI  ?[name], a 71 y.o. year old male, is here today because of his Primary osteoarthritis of left shoulder [M19.012]. Mr. Dault primary complain today is Back Pain (Lumbar bilateral ) ?Last encounter: My last encounter with him was on 09/20/2021. ?Pertinent problems: Mr. Matty has Type 2 diabetes mellitus with diabetic polyneuropathy, without long-term current use of insulin ([place]); Chronic left shoulder pain; Carpal tunnel syndrome of right wrist; Trigger finger of right thumb; Lumbar facet arthropathy; Lumbar degenerative disc disease; Bilateral primary osteoarthritis of knee; Primary osteoarthritis of left shoulder; and Trigger finger, right little finger on their pertinent problem list. ?Pain Assessment: Severity of Chronic pain is reported as a 8 /10. Location: Back Lower, Left, Right/? into knees. Onset: More than a month ago. Quality: Discomfort, Constant, Sharp, Pins and needles. Timing: Constant. Modifying factor(s): medications. ?Vitals:  height is _0  (1.88 m) and weight is 173 lb (78.5 kg). His temporal temperature is 97.2 ?F (36.2 ?C) (abnormal). His blood pressure is 185/102 (abnormal) and his pulse is 60. His  respiration is 16 and oxygen saturation is 98%.  ? ?Reason for encounter: medication management  ? ?Patient presents today endorsing worsening left hip pain that is worse with weightbearing.  History of left hip osteoarthritis.  We will update left hip x-ray we will also obtain SI joint x-ray.  Referral to Dr. Zigmund Daniel with sports medicine to consider ultrasound-guided hip intervention. ?Otherwise refill of hydrocodone as below, patient takes very sparingly, 5 mg twice daily as needed. ?Continue gabapentin 1200 mg nightly.  Stable on current dose. ? ?Pharmacotherapy Assessment  ?Analgesic: Hydrocodone 5 mg twice daily as needed, quantity 60/month MME equals 10   ? ?Monitoring: ? PMP: PDMP not reviewed this encounter.       ?Pharmacotherapy: No side-effects or adverse reactions reported. ?Compliance: No problems identified. ?Effectiveness: Clinically acceptable. ? ?Janett Billow, RN  03/14/2022  8:43 AM  Sign when Signing Visit ?Nursing Pain Medication Assessment:  ?Safety precautions to be maintained throughout the outpatient stay will include: orient to surroundings, keep bed in low position, maintain call bell within reach at all times, provide assistance with transfer out of bed and ambulation.  ?Medication Inspection Compliance: Pill count conducted under aseptic conditions, in front of the patient. Neither the pills nor the bottle was removed from the patient's sight at any time. Once count was completed pills were immediately returned to the patient in their original bottle. ? ?Medication: Hydrocodone/APAP ?Pill/Patch Count:  57 of 60 pills remain ?Pill/Patch Appearance: Markings consistent with prescribed medication ?Bottle Appearance: Standard pharmacy container. Clearly labeled. ?Filled Date: 03 / 27 / 2023 ?Last Medication intake:  Today ?  UDS:  ?Summary  ?Date Value Ref Range Status  ?02/08/2021 Note  Final  ?  Comment:  ?   ==================================================================== ?ToxASSURE Select  13 (MW) ?==================================================================== ?Test                             Result       Flag       Units ? ?Drug Present and Declared for Prescription Verification ?  Hydrocodone                    1369         EXPECTED   ng/mg creat ?  Hydromorphone                  190          EXPECTED   ng/mg creat ?  Norhydrocodone                 984          EXPECTED   ng/mg creat ?   Sources of hydrocodone include scheduled prescription medications. ?   Hydromorphone and norhydrocodone are expected metabolites of ?   hydrocodone. Hydromorphone is also available as a scheduled ?   prescription medication. ? ?==================================================================== ?Test                      Result    Flag   Units      Ref Range ?  Creatinine              61               mg/dL      >=20 ?==================================================================== ?Declared Medications: ? The flagging and interpretation on this report are based on the ? following declared medications.  Unexpected results may arise from ? inaccuracies in the declared medications. ? ? **Note: The testing scope of this panel includes these medications: ? ? Hydrocodone ([place]) ? ? **Note: The testing scope of this panel does not include the ? following reported medications: ? ? Acetaminophen (Norco) ? Atorvastatin (Lipitor) ? Cetirizine (Zyrtec) ? Empagliflozin (Jardiance) ? Gabapentin (Neurontin) ? Guaifenesin (Robitussin) ? Hydrochlorothiazide (Hydrodiuril) ? Insulin (Basaglar) ? Losartan (Cozaar) ? Meloxicam (Mobic) ? Metformin (Glucophage) ?==================================================================== ?For clinical consultation, please call [phone]. ?==================================================================== ?  ?  ? ?ROS  ?Constitutional: Denies any fever or chills ?Gastrointestinal: No  reported hemesis, hematochezia, vomiting, or acute GI distress ?Musculoskeletal:  Left shoulder, left hip, left buttock pain ?Neurological: No reported episodes of acute onset apraxia, aphasia, dysarthria, agnosia, amnesia, paralysis, loss of coordination, or loss of consciousness ? ?Medication Review  ?Alcohol Swabs, Basaglar KwikPen, HYDROcodone-acetaminophen, Insulin Pen Needle, ONE TOUCH DELICA LANCING DEV, OneTouch Delica Lancets Fine, OneTouch Verio, OneTouch Verio Reflect, amLODipine, atorvastatin, blood glucose meter kit and supplies, empagliflozin, gabapentin, glucose blood, hydrochlorothiazide, losartan, meloxicam, and metFORMIN ? ?History Review  ?Allergy: Mr. Saxton has No Known Allergies. ?Drug: Mr. Morgenstern  reports that he does not currently use drugs after having used the following drugs: Marijuana. ?Alcohol:  reports that he does not currently use alcohol. ?Tobacco:  reports that he has quit smoking. His smoking use included cigarettes. He has a 40.00 pack-year smoking history. He has never used smokeless tobacco. ?Social: Mr. Melroy  reports that he has quit smoking. His smoking use included cigarettes. He has a 40.00 pack-year smoking history. He has never used smokeless tobacco. He reports that he does not currently use alcohol. He reports that he does not currently use drugs after having used the following drugs: Marijuana. ?Medical:  has a past  medical history of Arthritis, Diabetes ([place]) (2000), Diabetic neuropathy ([place]), GERD (gastroesophageal reflux disease), HTN (hypertension) (2010), Hypercholesterolemia, Vision problem, and Weight loss (07/16/2019). ?Surgical: Mr. Luecke  has a past surgical history that includes Hand surgery (1980s); Nose surgery (1979); and Eye surgery (Left, 1990s). ?Family: family history includes ALS in his brother; Diabetes in his paternal grandfather; Heart attack in his father; Heart murmur in his father; Other in his mother. ? ?Laboratory Chemistry Profile   ? ?Renal ?Lab Results  ?Component Value Date  ? BUN 12 03/11/2022  ? CREATININE 1.09 03/11/2022  ? BCR 11 03/11/2022  ? GFRAA 88 10/19/2020  ? GFRNONAA 76 10/19/2020  ?  Hepatic ?Lab Results  ?Component Value Date  ? AST 16 07/15/2019  ? ALT 20 07/15/2019  ? ALBUMIN 4.9 (H) 07/15/2019  ? ALKPHOS 64

## 2022-03-14 NOTE — Progress Notes (Signed)
PROVIDER NOTE: Information contained herein reflects review and annotations entered in association with encounter. Interpretation of such information and data should be left to medically-trained personnel. Information provided to patient can be located elsewhere in the medical record under "Patient Instructions". Document created using STT-dictation technology, any transcriptional errors that may result from process are unintentional.  ?  ?Patient: Gregory Harrison  Service Category: E/M  Provider: Gillis Santa, MD  ?DOB: Mar 24, 1951  DOS: 03/14/2022  Specialty: Interventional Pain Management  ?MRN: [id number]  Setting: Ambulatory outpatient  PCP: [name], MD  ?Type: Established Patient    Referring Provider: [name], MD  ?Location: Office  Delivery: Face-to-face    ? ?HPI  ?Gregory Harrison, a 71 y.o. year old male, is here today because of his Primary osteoarthritis of left shoulder [M19.012]. Gregory Harrison primary complain today is Back Pain (Lumbar bilateral ) ?Last encounter: My last encounter with him was on 12/13/21 ?Pertinent problems: Gregory Harrison has Type 2 diabetes mellitus with diabetic polyneuropathy, without long-term current use of insulin ([place]); Chronic left shoulder pain; Carpal tunnel syndrome of right wrist; Trigger finger of right thumb; Lumbar facet arthropathy; Lumbar degenerative disc disease; Bilateral primary osteoarthritis of knee; Primary osteoarthritis of left shoulder; and Trigger finger, right little finger on their pertinent problem list. ?Pain Assessment: Severity of Chronic pain is reported as a 8 /10. Location: Back Lower, Left, Right/? into knees. Onset: More than a month ago. Quality: Discomfort, Constant, Sharp, Pins and needles. Timing: Constant. Modifying factor(s): medications. ?Vitals:  height is _0  (1.88 m) and weight is 173 lb (78.5 kg). His temporal temperature is 97.2 ?F (36.2 ?C) (abnormal). His blood pressure is 185/102 (abnormal) and his pulse is 60. His respiration is  16 and oxygen saturation is 98%.  ? ?Reason for encounter: medication management  ? ?Syair presents today for medication management.  He saw my colleague, Dr. Zigmund Daniel with sports medicine.  He had a left subacromial steroid injection done for his left shoulder pain which she found helpful.  He was placed on low-dose meloxicam 7.5 mg for 2 weeks which she states was really helpful.  He is presenting today for refill of his hydrocodone which she takes 5 mg twice daily as needed for breakthrough pain.  He will be heading down to [place] for the next 2 weeks to help manage his mother's estate.  I will also refill his gabapentin as below.  No change in dose.  We will renew at his annual urine toxicology screen for medication compliance monitoring. ? ? ?Pharmacotherapy Assessment  ?Analgesic: Hydrocodone 5 mg twice daily as needed, quantity 60/month MME equals 10   ? ?Monitoring: ?[place] PMP: PDMP reviewed during this encounter.       ?Pharmacotherapy: No side-effects or adverse reactions reported. ?Compliance: No problems identified. ?Effectiveness: Clinically acceptable. ? ?Janett Billow, RN  03/14/2022  8:43 AM  Sign when Signing Visit ?Nursing Pain Medication Assessment:  ?Safety precautions to be maintained throughout the outpatient stay will include: orient to surroundings, keep bed in low position, maintain call bell within reach at all times, provide assistance with transfer out of bed and ambulation.  ?Medication Inspection Compliance: Pill count conducted under aseptic conditions, in front of the patient. Neither the pills nor the bottle was removed from the patient's sight at any time. Once count was completed pills were immediately returned to the patient in their original bottle. ? ?Medication: Hydrocodone/APAP ?Pill/Patch Count:  57 of 60 pills remain ?Pill/Patch Appearance: Markings  consistent with prescribed medication ?Bottle Appearance: Standard pharmacy container. Clearly labeled. ?Filled Date: 03 / 27  / 2023 ?Last Medication intake:  Today ?  ?  UDS:  ?Summary  ?Date Value Ref Range Status  ?02/08/2021 Note  Final  ?  Comment:  ?  ==================================================================== ?ToxASSURE Select 13 (MW) ?==================================================================== ?Test                             Result       Flag       Units ? ?Drug Present and Declared for Prescription Verification ?  Hydrocodone                    1369         EXPECTED   ng/mg creat ?  Hydromorphone                  190          EXPECTED   ng/mg creat ?  Norhydrocodone                 984          EXPECTED   ng/mg creat ?   Sources of hydrocodone include scheduled prescription medications. ?   Hydromorphone and norhydrocodone are expected metabolites of ?   hydrocodone. Hydromorphone is also available as a scheduled ?   prescription medication. ? ?==================================================================== ?Test                      Result    Flag   Units      Ref Range ?  Creatinine              61               mg/dL      >=20 ?==================================================================== ?Declared Medications: ? The flagging and interpretation on this report are based on the ? following declared medications.  Unexpected results may arise from ? inaccuracies in the declared medications. ? ? **Note: The testing scope of this panel includes these medications: ? ? Hydrocodone (Norco) ? ? **Note: The testing scope of this panel does not include the ? following reported medications: ? ? Acetaminophen (Norco) ? Atorvastatin (Lipitor) ? Cetirizine (Zyrtec) ? Empagliflozin (Jardiance) ? Gabapentin (Neurontin) ? Guaifenesin (Robitussin) ? Hydrochlorothiazide (Hydrodiuril) ? Insulin (Basaglar) ? Losartan (Cozaar) ? Meloxicam (Mobic) ? Metformin (Glucophage) ?==================================================================== ?For clinical consultation, please call [phone]. ?==================================================================== ?  ?  ? ?ROS  ?Constitutional: Denies any fever or chills ?Gastrointestinal: No reported hemesis, hematochezia, vomiting, or acute GI distress ?Musculoskeletal:  Left shoulder, left hip, left buttock pain, improved from before ?Neurological: No reported episodes of acute onset apraxia, aphasia, dysarthria, agnosia, amnesia, paralysis, loss of coordination, or loss of consciousness ? ?Medication Review  ?Alcohol Swabs, Basaglar KwikPen, HYDROcodone-acetaminophen, Insulin Pen Needle, ONE TOUCH DELICA LANCING DEV, OneTouch Delica Lancets Fine, OneTouch Verio, OneTouch Verio Reflect, amLODipine, atorvastatin, blood glucose meter kit and supplies, empagliflozin, gabapentin, glucose blood, hydrochlorothiazide, losartan, meloxicam, and metFORMIN ? ?History Review  ?Allergy: Gregory Harrison has No Known Allergies. ?Drug: Gregory Harrison  reports that he does not currently use drugs after having used the following drugs: Marijuana. ?Alcohol:  reports that he does not currently use alcohol. ?Tobacco:  reports that he has quit smoking. His smoking use included cigarettes. He has a 40.00 pack-year smoking history. He has never used smokeless tobacco. ?Social: Gregory Harrison  reports that he has quit smoking. His smoking use included cigarettes. He has a 40.00 pack-year smoking history. He has never used smokeless tobacco. He reports that he does not currently use alcohol. He reports that he does not currently use drugs after having used the following drugs: Marijuana. ?Medical:  has a past medical history of Arthritis, Diabetes ([place]) (2000), Diabetic neuropathy ([place]), GERD (gastroesophageal reflux disease), HTN (hypertension) (2010), Hypercholesterolemia, Vision problem, and Weight loss (07/16/2019). ?Surgical: Gregory Harrison  has a past surgical history that includes Hand surgery (1980s); Nose surgery (1979); and Eye surgery (Left, 1990s). ?Family: family history  includes ALS in his brother; Diabetes in his paternal grandfather; Heart attack in his father; Heart murmur in his father; Other in his mother. ? ?Laboratory Chemistry Profile  ? ?Renal ?Lab Results  ?Component Value Date  ? BUN 12 04/03

## 2022-03-14 NOTE — Progress Notes (Signed)
Nursing Pain Medication Assessment:  ?Safety precautions to be maintained throughout the outpatient stay will include: orient to surroundings, keep bed in low position, maintain call bell within reach at all times, provide assistance with transfer out of bed and ambulation.  ?Medication Inspection Compliance: Pill count conducted under aseptic conditions, in front of the patient. Neither the pills nor the bottle was removed from the patient's sight at any time. Once count was completed pills were immediately returned to the patient in their original bottle. ? ?Medication: Hydrocodone/APAP ?Pill/Patch Count:  57 of 60 pills remain ?Pill/Patch Appearance: Markings consistent with prescribed medication ?Bottle Appearance: Standard pharmacy container. Clearly labeled. ?Filled Date: 03 / 27 / 2023 ?Last Medication intake:  Today ?

## 2022-03-16 ENCOUNTER — Other Ambulatory Visit: Payer: Self-pay | Admitting: Family Medicine

## 2022-03-16 DIAGNOSIS — E7800 Pure hypercholesterolemia, unspecified: Secondary | ICD-10-CM

## 2022-03-16 DIAGNOSIS — M1611 Unilateral primary osteoarthritis, right hip: Secondary | ICD-10-CM

## 2022-03-16 DIAGNOSIS — M12812 Other specific arthropathies, not elsewhere classified, left shoulder: Secondary | ICD-10-CM

## 2022-03-16 DIAGNOSIS — M1612 Unilateral primary osteoarthritis, left hip: Secondary | ICD-10-CM

## 2022-03-18 ENCOUNTER — Encounter: Payer: Self-pay | Admitting: Family Medicine

## 2022-03-18 NOTE — Telephone Encounter (Signed)
Prescription sent to [facility]. ?Requested Prescriptions  ?Refused Prescriptions Disp Refills  ?? meloxicam (MOBIC) 7.5 MG tablet [Pharmacy Med Name: MELOXICAM 7.5 MG TABLET] 30 tablet 2  ?  Sig: TAKE 1 TABLET BY MOUTH EVERY DAY AS NEEDED FOR PAIN  ?  ? Analgesics:  COX2 Inhibitors Failed - 03/16/2022  4:23 PM  ?  ?  Failed - Manual Review: Labs are only required if the patient has taken medication for more than 8 weeks.  ?  ?  Failed - HGB in normal range and within 360 days  ?  Hemoglobin  ?Date Value Ref Range Status  ?07/15/2019 14.4 13.0 - 17.7 g/dL Final  ?   ?  ?  Failed - HCT in normal range and within 360 days  ?  Hematocrit  ?Date Value Ref Range Status  ?07/15/2019 44.1 37.5 - 51.0 % Final  ?   ?  ?  Failed - AST in normal range and within 360 days  ?  AST  ?Date Value Ref Range Status  ?07/15/2019 16 0 - 40 IU/L Final  ?   ?  ?  Failed - ALT in normal range and within 360 days  ?  ALT  ?Date Value Ref Range Status  ?07/15/2019 20 0 - 44 IU/L Final  ?   ?  ?  Passed - Cr in normal range and within 360 days  ?  Creatinine, Ser  ?Date Value Ref Range Status  ?03/11/2022 1.09 0.76 - 1.27 mg/dL Final  ? ?Creatinine, POC  ?Date Value Ref Range Status  ?03/09/2018 50 mg/dL Final  ?   ?  ?  Passed - eGFR is 30 or above and within 360 days  ?  GFR calc Af Amer  ?Date Value Ref Range Status  ?10/19/2020 88 >59 mL/min/1.73 Final  ?  Comment:  ?  **In accordance with recommendations from the NKF-ASN Task force,** ?  [facility] is in the process of updating its eGFR calculation to the ?  2021 CKD-EPI creatinine equation that estimates kidney function ?  without a race variable. ?  ? ?GFR calc non Af Amer  ?Date Value Ref Range Status  ?10/19/2020 76 >59 mL/min/1.73 Final  ? ?eGFR  ?Date Value Ref Range Status  ?03/11/2022 72 >59 mL/min/1.73 Final  ?   ?  ?  Passed - Patient is not pregnant  ?  ?  Passed - Valid encounter within last 12 months  ?  Recent Outpatient Visits   ?      ? 1 week ago Rotator cuff arthropathy, left   ? [facility] Montel Culver, MD  ? 2 months ago Rotator cuff arthropathy, left  ? [facility] Montel Culver, MD  ? 2 months ago Primary osteoarthritis of right hip  ? [facility] Montel Culver, MD  ?  ?  ? ?  ?  ?  ? ?

## 2022-03-18 NOTE — Progress Notes (Signed)
Letter sent with lab results.   ? ?[name], MD ?[facility]   ?

## 2022-03-21 LAB — TOXASSURE SELECT 13 (MW), URINE

## 2022-04-02 ENCOUNTER — Other Ambulatory Visit: Payer: Self-pay | Admitting: Family Medicine

## 2022-04-02 DIAGNOSIS — M12812 Other specific arthropathies, not elsewhere classified, left shoulder: Secondary | ICD-10-CM

## 2022-04-02 DIAGNOSIS — M1612 Unilateral primary osteoarthritis, left hip: Secondary | ICD-10-CM

## 2022-04-02 DIAGNOSIS — M1611 Unilateral primary osteoarthritis, right hip: Secondary | ICD-10-CM

## 2022-04-03 NOTE — Telephone Encounter (Signed)
Call to patient- Rx 03/07/22 #30 2RF- was sent to [facility]- patient states it does not matter which pharmacy he uses- he will call [facility] for RF. Patient advised please call office with any problems. ?Requested Prescriptions  ?Pending Prescriptions Disp Refills  ?? meloxicam (MOBIC) 7.5 MG tablet [Pharmacy Med Name: MELOXICAM 7.5 MG TABLET] 30 tablet 2  ?  Sig: TAKE 1 TABLET BY MOUTH EVERY DAY AS NEEDED FOR PAIN  ?  ? Analgesics:  COX2 Inhibitors Failed - 04/02/2022  8:12 AM  ?  ?  Failed - Manual Review: Labs are only required if the patient has taken medication for more than 8 weeks.  ?  ?  Failed - HGB in normal range and within 360 days  ?  Hemoglobin  ?Date Value Ref Range Status  ?07/15/2019 14.4 13.0 - 17.7 g/dL Final  ?   ?  ?  Failed - HCT in normal range and within 360 days  ?  Hematocrit  ?Date Value Ref Range Status  ?07/15/2019 44.1 37.5 - 51.0 % Final  ?   ?  ?  Failed - AST in normal range and within 360 days  ?  AST  ?Date Value Ref Range Status  ?07/15/2019 16 0 - 40 IU/L Final  ?   ?  ?  Failed - ALT in normal range and within 360 days  ?  ALT  ?Date Value Ref Range Status  ?07/15/2019 20 0 - 44 IU/L Final  ?   ?  ?  Passed - Cr in normal range and within 360 days  ?  Creatinine, Ser  ?Date Value Ref Range Status  ?03/11/2022 1.09 0.76 - 1.27 mg/dL Final  ? ?Creatinine, POC  ?Date Value Ref Range Status  ?03/09/2018 50 mg/dL Final  ?   ?  ?  Passed - eGFR is 30 or above and within 360 days  ?  GFR calc Af Amer  ?Date Value Ref Range Status  ?10/19/2020 88 >59 mL/min/1.73 Final  ?  Comment:  ?  **In accordance with recommendations from the NKF-ASN Task force,** ?  [facility] is in the process of updating its eGFR calculation to the ?  2021 CKD-EPI creatinine equation that estimates kidney function ?  without a race variable. ?  ? ?GFR calc non Af Amer  ?Date Value Ref Range Status  ?10/19/2020 76 >59 mL/min/1.73 Final  ? ?eGFR  ?Date Value Ref Range Status  ?03/11/2022 72 >59 mL/min/1.73 Final  ?   ?  ?   Passed - Patient is not pregnant  ?  ?  Passed - Valid encounter within last 12 months  ?  Recent Outpatient Visits   ?      ? 3 weeks ago Rotator cuff arthropathy, left  ? [facility] Montel Culver, MD  ? 2 months ago Rotator cuff arthropathy, left  ? [place], MD  ? 3 months ago Primary osteoarthritis of right hip  ? [facility] Montel Culver, MD  ?  ?  ? ?  ?  ?  ? ?

## 2022-04-21 ENCOUNTER — Other Ambulatory Visit: Payer: Self-pay | Admitting: Family Medicine

## 2022-04-21 DIAGNOSIS — M1612 Unilateral primary osteoarthritis, left hip: Secondary | ICD-10-CM

## 2022-04-21 DIAGNOSIS — M12812 Other specific arthropathies, not elsewhere classified, left shoulder: Secondary | ICD-10-CM

## 2022-04-21 DIAGNOSIS — M1611 Unilateral primary osteoarthritis, right hip: Secondary | ICD-10-CM

## 2022-04-23 NOTE — Telephone Encounter (Signed)
Requested medication (s) are due for refill today - no ? ?Requested medication (s) are on the active medication list -yes ? ?Future visit scheduled -no ? ?Last refill: 03/07/22 #30 2RF ? ?Notes to clinic: [place] lab protocol- over 1 year 2020 ? ?Requested Prescriptions  ?Pending Prescriptions Disp Refills  ? meloxicam (MOBIC) 7.5 MG tablet [Pharmacy Med Name: MELOXICAM 7.5 MG TABLET] 30 tablet 2  ?  Sig: TAKE 1 TABLET BY MOUTH EVERY DAY AS NEEDED FOR PAIN  ?  ? Analgesics:  COX2 Inhibitors Failed - 04/21/2022  7:46 AM  ?  ?  Failed - Manual Review: Labs are only required if the patient has taken medication for more than 8 weeks.  ?  ?  Failed - HGB in normal range and within 360 days  ?  Hemoglobin  ?Date Value Ref Range Status  ?07/15/2019 14.4 13.0 - 17.7 g/dL Final  ?   ?  ?  Failed - HCT in normal range and within 360 days  ?  Hematocrit  ?Date Value Ref Range Status  ?07/15/2019 44.1 37.5 - 51.0 % Final  ?   ?  ?  Failed - AST in normal range and within 360 days  ?  AST  ?Date Value Ref Range Status  ?07/15/2019 16 0 - 40 IU/L Final  ?   ?  ?  Failed - ALT in normal range and within 360 days  ?  ALT  ?Date Value Ref Range Status  ?07/15/2019 20 0 - 44 IU/L Final  ?   ?  ?  Passed - Cr in normal range and within 360 days  ?  Creatinine, Ser  ?Date Value Ref Range Status  ?03/11/2022 1.09 0.76 - 1.27 mg/dL Final  ? ?Creatinine, POC  ?Date Value Ref Range Status  ?03/09/2018 50 mg/dL Final  ?   ?  ?  Passed - eGFR is 30 or above and within 360 days  ?  GFR calc Af Amer  ?Date Value Ref Range Status  ?10/19/2020 88 >59 mL/min/1.73 Final  ?  Comment:  ?  **In accordance with recommendations from the NKF-ASN Task force,** ?  [facility] is in the process of updating its eGFR calculation to the ?  2021 CKD-EPI creatinine equation that estimates kidney function ?  without a race variable. ?  ? ?GFR calc non Af Amer  ?Date Value Ref Range Status  ?10/19/2020 76 >59 mL/min/1.73 Final  ? ?eGFR  ?Date Value Ref Range Status   ?03/11/2022 72 >59 mL/min/1.73 Final  ?   ?  ?  Passed - Patient is not pregnant  ?  ?  Passed - Valid encounter within last 12 months  ?  Recent Outpatient Visits   ? ?      ? 1 month ago Rotator cuff arthropathy, left  ? [place], MD  ? 3 months ago Rotator cuff arthropathy, left  ? [place], MD  ? 3 months ago Primary osteoarthritis of right hip  ? [facility] Montel Culver, MD  ? ?  ?  ? ? ?  ?  ?  ? ? ? ?Requested Prescriptions  ?Pending Prescriptions Disp Refills  ? meloxicam (MOBIC) 7.5 MG tablet [Pharmacy Med Name: MELOXICAM 7.5 MG TABLET] 30 tablet 2  ?  Sig: TAKE 1 TABLET BY MOUTH EVERY DAY AS NEEDED FOR PAIN  ?  ? Analgesics:  COX2 Inhibitors Failed - 04/21/2022  7:46 AM  ?  ?  Failed - Manual Review: Labs are only required if the patient has taken medication for more than 8 weeks.  ?  ?  Failed - HGB in normal range and within 360 days  ?  Hemoglobin  ?Date Value Ref Range Status  ?07/15/2019 14.4 13.0 - 17.7 g/dL Final  ?   ?  ?  Failed - HCT in normal range and within 360 days  ?  Hematocrit  ?Date Value Ref Range Status  ?07/15/2019 44.1 37.5 - 51.0 % Final  ?   ?  ?  Failed - AST in normal range and within 360 days  ?  AST  ?Date Value Ref Range Status  ?07/15/2019 16 0 - 40 IU/L Final  ?   ?  ?  Failed - ALT in normal range and within 360 days  ?  ALT  ?Date Value Ref Range Status  ?07/15/2019 20 0 - 44 IU/L Final  ?   ?  ?  Passed - Cr in normal range and within 360 days  ?  Creatinine, Ser  ?Date Value Ref Range Status  ?03/11/2022 1.09 0.76 - 1.27 mg/dL Final  ? ?Creatinine, POC  ?Date Value Ref Range Status  ?03/09/2018 50 mg/dL Final  ?   ?  ?  Passed - eGFR is 30 or above and within 360 days  ?  GFR calc Af Amer  ?Date Value Ref Range Status  ?10/19/2020 88 >59 mL/min/1.73 Final  ?  Comment:  ?  **In accordance with recommendations from the NKF-ASN Task force,** ?  [facility] is in the process of updating its eGFR  calculation to the ?  2021 CKD-EPI creatinine equation that estimates kidney function ?  without a race variable. ?  ? ?GFR calc non Af Amer  ?Date Value Ref Range Status  ?10/19/2020 76 >59 mL/min/1.73 Final  ? ?eGFR  ?Date Value Ref Range Status  ?03/11/2022 72 >59 mL/min/1.73 Final  ?   ?  ?  Passed - Patient is not pregnant  ?  ?  Passed - Valid encounter within last 12 months  ?  Recent Outpatient Visits   ? ?      ? 1 month ago Rotator cuff arthropathy, left  ? [place], MD  ? 3 months ago Rotator cuff arthropathy, left  ? [place], [place]  ? 3 months ago Primary osteoarthritis of right hip  ? [facility] Montel Culver, MD  ? ?  ?  ? ? ?  ?  ?  ? ? ? ?

## 2022-04-25 ENCOUNTER — Other Ambulatory Visit: Payer: Self-pay | Admitting: Family Medicine

## 2022-04-25 DIAGNOSIS — M1611 Unilateral primary osteoarthritis, right hip: Secondary | ICD-10-CM

## 2022-04-25 DIAGNOSIS — M12812 Other specific arthropathies, not elsewhere classified, left shoulder: Secondary | ICD-10-CM

## 2022-04-25 DIAGNOSIS — M1612 Unilateral primary osteoarthritis, left hip: Secondary | ICD-10-CM

## 2022-04-25 NOTE — Telephone Encounter (Signed)
Change of pharmacy- 1 RF left on original prescription sent to new pharmacy. Pt needs OV for lab work for further refills.  Requested Prescriptions  Pending Prescriptions Disp Refills  . meloxicam (MOBIC) 7.5 MG tablet [Pharmacy Med Name: MELOXICAM 7.5 MG TABLET] 30 tablet 0    Sig: TAKE 1 TABLET BY MOUTH EVERY DAY AS NEEDED FOR PAIN     Analgesics:  COX2 Inhibitors Failed - 04/25/2022 11:51 AM      Failed - Manual Review: Labs are only required if the patient has taken medication for more than 8 weeks.      Failed - HGB in normal range and within 360 days    Hemoglobin  Date Value Ref Range Status  07/15/2019 14.4 13.0 - 17.7 g/dL Final         Failed - HCT in normal range and within 360 days    Hematocrit  Date Value Ref Range Status  07/15/2019 44.1 37.5 - 51.0 % Final         Failed - AST in normal range and within 360 days    AST  Date Value Ref Range Status  07/15/2019 16 0 - 40 IU/L Final         Failed - ALT in normal range and within 360 days    ALT  Date Value Ref Range Status  07/15/2019 20 0 - 44 IU/L Final         Passed - Cr in normal range and within 360 days    Creatinine, Ser  Date Value Ref Range Status  03/11/2022 1.09 0.76 - 1.27 mg/dL Final   Creatinine, POC  Date Value Ref Range Status  03/09/2018 50 mg/dL Final         Passed - eGFR is 30 or above and within 360 days    GFR calc Af Amer  Date Value Ref Range Status  10/19/2020 88 >59 mL/min/1.73 Final    Comment:    **In accordance with recommendations from the NKF-ASN Task force,**   [facility] is in the process of updating its eGFR calculation to the   2021 CKD-EPI creatinine equation that estimates kidney function   without a race variable.    GFR calc non Af Amer  Date Value Ref Range Status  10/19/2020 76 >59 mL/min/1.73 Final   eGFR  Date Value Ref Range Status  03/11/2022 72 >59 mL/min/1.73 Final         Passed - Patient is not pregnant      Passed - Valid encounter within  last 12 months    Recent Outpatient Visits          1 month ago Rotator cuff arthropathy, left   [facility] Montel Culver, MD   3 months ago Rotator cuff arthropathy, left   [facility] Montel Culver, MD   4 months ago Primary osteoarthritis of right hip   [facility] Montel Culver, MD

## 2022-05-02 ENCOUNTER — Ambulatory Visit (INDEPENDENT_AMBULATORY_CARE_PROVIDER_SITE_OTHER): Payer: PPO | Admitting: Family Medicine

## 2022-05-02 VITALS — BP 135/70 | HR 67 | Ht 74.0 in | Wt 179.0 lb

## 2022-05-02 DIAGNOSIS — L989 Disorder of the skin and subcutaneous tissue, unspecified: Secondary | ICD-10-CM

## 2022-05-02 NOTE — Progress Notes (Unsigned)
    SUBJECTIVE:   CHIEF COMPLAINT / HPI:   Pruritis Back lesion has been itching for 2 weeks. He recently scratched something off. He suspects a tick but did not see the tick, he was scratching in the nighttime. He has a history of tick bites and endorses joint pain in knees and shoulder as well as headaches. He does endorse a history of arthritis and denies seeing a bullseye rash over the last 2 weeks. He did notice a tick and bullseye rash the last time he suspected a tick bite. He does spend a lot of time outside. Denies vision changes, chest pain, and shortness of breath.   PERTINENT  PMH / PSH: Chronic pain syndrome, Arthritis of multiple joint, Hand lesion suspicious for cancer.   OBJECTIVE:   BP 135/70   Pulse 67   Ht '6\' 2"'$  (1.88 m)   Wt 179 lb (81.2 kg)   SpO2 97%   BMI 22.98 kg/m   General: Appears well, no acute distress. Age appropriate. Respiratory: normal effort Skin: Circular red raised lesion with central darkening that is possibly blood. Noticeable AK next to it.  Neuro: alert and oriented, no focal deficits Psych: normal affect        ASSESSMENT/PLAN:   1. Skin lesion 2 weeks of pruritis of small circular raise lesion that is erythematous on his back. Low suspicion for tick bite and higher suspicion of skin cancer given hx of sun exposure and high suspicion of cancerous lesion on his had. Joint pain 2/2 to chronic arthritis and lesion does not appear to be from a tick bite, symptoms of tick exposure discussed. He has a referral for dermatology for the hand. In the meantime he will be scheduled with our dermatology clinic for a biopsy of the back lesion. Return precautions given.   Gerlene Fee, [place]

## 2022-05-02 NOTE — Patient Instructions (Signed)
We would like to make sure this lesion is not cancerous. I have scheduled you to come into our dermatology clinic for a biopsy. In the meantime you can continue to use your hydrocortisone cream.  Please call the Dermatology clinic to schedule appointment Address: [facility], [address], [place], [place] [address] Phone: [phone]

## 2022-05-09 ENCOUNTER — Other Ambulatory Visit: Payer: Self-pay | Admitting: Family Medicine

## 2022-05-09 ENCOUNTER — Ambulatory Visit (INDEPENDENT_AMBULATORY_CARE_PROVIDER_SITE_OTHER): Payer: PPO | Admitting: Family Medicine

## 2022-05-09 VITALS — BP 131/75 | HR 74 | Temp 97.6°F | Wt 176.8 lb

## 2022-05-09 DIAGNOSIS — L989 Disorder of the skin and subcutaneous tissue, unspecified: Secondary | ICD-10-CM

## 2022-05-09 DIAGNOSIS — D489 Neoplasm of uncertain behavior, unspecified: Secondary | ICD-10-CM | POA: Diagnosis not present

## 2022-05-09 HISTORY — DX: Disorder of the skin and subcutaneous tissue, unspecified: L98.9

## 2022-05-09 NOTE — Progress Notes (Signed)
    SUBJECTIVE:   CHIEF COMPLAINT / HPI:   Left back lesion: present the last couple of months. Itching; not able to visualize given location. Anti-itch cream has provided no benefit.   Left hand lesion: Hasn't changed in size. Painful to rub across. Messes with the area often. Skin gets tight, can have clear fluid drain from hand.   No history of skin cancers.   PERTINENT  PMH / PSH:  Patient Active Problem List   Diagnosis Date Noted   Hand lesion 05/09/2022   Back skin lesion 03/12/2022   Primary osteoarthritis of right hip 12/25/2021   Primary osteoarthritis of left hip 12/25/2021   Rotator cuff arthropathy, left 12/25/2021   Chronic left SI joint pain 12/13/2021   Chronic left hip pain 12/13/2021   Chronic pain syndrome 12/13/2021   Post-viral cough syndrome 10/20/2020   Acute viral bronchitis 09/13/2020   Trigger finger, right little finger 01/15/2020   Lumbar facet arthropathy 10/07/2019   Lumbar degenerative disc disease 10/07/2019   Bilateral primary osteoarthritis of knee 10/07/2019   Primary osteoarthritis of left shoulder 10/07/2019   Pain in joint, shoulder region 03/24/2019   Carpal tunnel syndrome of right wrist 06/04/2018   Trigger finger of right thumb 06/04/2018   Chronic left shoulder pain 02/26/2018   Hypercholesterolemia    Diabetic neuropathy (HCC)    HTN (hypertension) 12/09/2008   Type 2 diabetes mellitus with diabetic polyneuropathy, without long-term current use of insulin ([place]) 12/09/1998     OBJECTIVE:   BP 131/75   Pulse 74   Temp 97.6 F (36.4 C)   Wt 176 lb 12.8 oz (80.2 kg)   SpO2 98%   BMI 22.70 kg/m   General: Alert and oriented in no apparent distress, pleasant elderly man  Lungs: Normal WOB  Skin: Many non-raised hyperpigmented skin changes over trunk/chest/back, small non papular lesion with no drainage over left middle back, NTTP, 3 mm in diameter with irregular borders   Extremities: No lower extremity edema  Procedure:   Excisional Biopsy:  Prepped patient with alcohol and marked borders with 63m outward to create margins. Using sterile gloves, provided iodine and placed sterile drape. Blunt cut around margins with 10 blade scalpel. Use of absorbable 3-0 sutures for deep closure, interrupted. Placed 6 non-absorbable 3-0 vicryl sutures for superficial closure. Cleaned around area, and placed Neosporin with bandage.     ASSESSMENT/PLAN:   Hand lesion Will have excised with dermatology, scheduled in August 2023.   Back skin lesion Used appropriate margins for excision with sterile environment. Sent biopsy to pathology for further assessment and will have the patient return in 2 weeks for suture removal of 6 total external non-absorbable sutures. Return precautions provided.      AErskine Emery MD [place]

## 2022-05-09 NOTE — Patient Instructions (Signed)
It was nice seeing you today. We took off the lesion on your back and send it off for pathologic evaluation. We will call you as soon as we have a result.  Schedule nurse appointment in 14 days for wound check and possible suture removal.  Wound Care, Adult Taking care of your wound properly can help to prevent pain, infection, and scarring. It can also help your wound heal more quickly. Follow instructions from your health care provider about how to care for your wound. Supplies needed: Soap and water. Wound cleanser, saline, or germ-free (sterile) water. Gauze. If needed, a clean bandage (dressing) or other type of wound dressing material to cover or place in the wound. Follow your health care provider's instructions about what dressing supplies to use. Cream or topical ointment to apply to the wound, if told by your health care provider. How to care for your wound Cleaning the wound Ask your health care provider how to clean the wound. This may include: Using mild soap and water, a wound cleanser, saline, or sterile water. Using a clean gauze to pat the wound dry after cleaning it. Do not rub or scrub the wound. Dressing care Wash your hands with soap and water for at least 20 seconds before and after you change the dressing. If soap and water are not available, use hand sanitizer. Change your dressing as told by your health care provider. This may include: Cleaning or rinsing out (irrigating) the wound. Application of cream or topical ointment, if told by your health care provider. Placing a dressing over the wound or in the wound (packing). Covering the wound with an outer dressing. Leave stitches (sutures), staples, skin glue, or adhesive strips in place. These skin closures may need to stay in place for 2 weeks or longer. If adhesive strip edges start to loosen and curl up, you may trim the loose edges. Do not remove adhesive strips completely unless your health care provider tells you  to do that. Ask your health care provider when you can leave the wound uncovered. Checking for infection Check your wound area every day for signs of infection. Check for: More redness, swelling, or pain. Fluid or blood. Warmth. Pus or a bad smell.  Follow these instructions at home Medicines If you were prescribed an antibiotic medicine, cream, or ointment, take or apply it as told by your health care provider. Do not stop using the antibiotic even if your condition improves. If you were prescribed pain medicine, take it 30 minutes before you do any wound care or as told by your health care provider. Take over-the-counter and prescription medicines only as told by your health care provider. Eating and drinking Eat a diet that includes protein, vitamin A, vitamin C, and other nutrient-rich foods to help the wound heal. Foods rich in protein include meat, fish, eggs, dairy, beans, and nuts. Foods rich in vitamin A include carrots and dark green, leafy vegetables. Foods rich in vitamin C include citrus fruits, tomatoes, broccoli, and peppers. Drink enough fluid to keep your urine pale yellow. General instructions Do not take baths, swim, or use a hot tub until your health care provider approves. Ask your health care provider if you may take showers. You may only be allowed to take sponge baths. Do not scratch or pick at the wound. Keep it covered as told by your health care provider. Return to your normal activities as told by your health care provider. Ask your health care provider what activities are  safe for you. Protect your wound from the sun when you are outside for the first 6 months, or for as long as told by your health care provider. Cover up the scar area or apply sunscreen that has an SPF of at least 54. Do not use any products that contain nicotine or tobacco. These products include cigarettes, chewing tobacco, and vaping devices, such as e-cigarettes. If you need help quitting,  ask your health care provider. Keep all follow-up visits. This is important. Contact a health care provider if: You received a tetanus shot and you have swelling, severe pain, redness, or bleeding at the injection site. Your pain is not controlled with medicine. You have any of these signs of infection: More redness, swelling, or pain around the wound. Fluid or blood coming from the wound. Warmth coming from the wound. A fever or chills. You are nauseous or you vomit. You are dizzy. You have a new rash or hardness around the wound. Get help right away if: You have a red streak of skin near the area around your wound. Pus or a bad smell coming from the wound. Your wound has been closed with staples, sutures, skin glue, or adhesive strips and it begins to open up and separate. Your wound is bleeding, and the bleeding does not stop with gentle pressure. These symptoms may represent a serious problem that is an emergency. Do not wait to see if the symptoms will go away. Get medical help right away. Call your local emergency services (911 in the [place] Do not drive yourself to the hospital. Summary Always wash your hands with soap and water for at least 20 seconds before and after changing your dressing. Change your dressing as told by your health care provider. To help with healing, eat foods that are rich in protein, vitamin A, vitamin C, and other nutrients. Check your wound every day for signs of infection. Contact your health care provider if you think that your wound is infected. This information is not intended to replace advice given to you by your health care provider. Make sure you discuss any questions you have with your health care provider. Document Revised: 04/03/2021 Document Reviewed: 04/03/2021 Elsevier Patient Education  [employer].

## 2022-05-09 NOTE — Assessment & Plan Note (Signed)
Used appropriate margins for excision with sterile environment. Sent biopsy to pathology for further assessment and will have the patient return in 2 weeks for suture removal of 6 total external non-absorbable sutures. Return precautions provided.

## 2022-05-09 NOTE — Assessment & Plan Note (Deleted)
Will have excised with dermatology, scheduled in August 2023.

## 2022-05-09 NOTE — Assessment & Plan Note (Signed)
Will have excised with dermatology, scheduled in August 2023.

## 2022-05-14 ENCOUNTER — Telehealth: Payer: Self-pay | Admitting: Family Medicine

## 2022-05-14 NOTE — Telephone Encounter (Signed)
I was unable to reach the patient on all listed numbers.  HIPAA compliant callback message left.  Note: Skin biopsy is negative for malignancy. F/U for wound check and suture removal as planned.

## 2022-05-15 NOTE — Telephone Encounter (Signed)
Skin excisional biopsy report discussed with him. He stated that his incisional wound looks good. No concern. F/U for suture removal as planned.

## 2022-05-30 ENCOUNTER — Ambulatory Visit: Payer: PPO

## 2022-05-30 DIAGNOSIS — Z4802 Encounter for removal of sutures: Secondary | ICD-10-CM

## 2022-05-30 NOTE — Progress Notes (Signed)
Patient presents for suture removal. The wound is well healed without signs of infection.  The sutures are removed. Wound care and activity instructions given. Return prn.

## 2022-06-05 ENCOUNTER — Other Ambulatory Visit: Payer: Self-pay | Admitting: Family Medicine

## 2022-06-05 DIAGNOSIS — M12812 Other specific arthropathies, not elsewhere classified, left shoulder: Secondary | ICD-10-CM

## 2022-06-05 DIAGNOSIS — M1611 Unilateral primary osteoarthritis, right hip: Secondary | ICD-10-CM

## 2022-06-05 DIAGNOSIS — M1612 Unilateral primary osteoarthritis, left hip: Secondary | ICD-10-CM

## 2022-06-05 NOTE — Telephone Encounter (Signed)
Requested medication (s) are due for refill today:   Yes  Requested medication (s) are on the active medication list:   Yes  Future visit scheduled:   No  Seen 3 mo. Ago by Dr. Zigmund Daniel   Last ordered: 04/25/2022 #30, 0 refills  Returned because lab work is due per protocol.     Requested Prescriptions  Pending Prescriptions Disp Refills   meloxicam (MOBIC) 7.5 MG tablet [Pharmacy Med Name: MELOXICAM 7.5 MG TABLET] 30 tablet 0    Sig: TAKE 1 TABLET BY MOUTH EVERY DAY AS NEEDED FOR PAIN     Analgesics:  COX2 Inhibitors Failed - 06/05/2022  2:02 AM      Failed - Manual Review: Labs are only required if the patient has taken medication for more than 8 weeks.      Failed - HGB in normal range and within 360 days    Hemoglobin  Date Value Ref Range Status  07/15/2019 14.4 13.0 - 17.7 g/dL Final         Failed - HCT in normal range and within 360 days    Hematocrit  Date Value Ref Range Status  07/15/2019 44.1 37.5 - 51.0 % Final         Failed - AST in normal range and within 360 days    AST  Date Value Ref Range Status  07/15/2019 16 0 - 40 IU/L Final         Failed - ALT in normal range and within 360 days    ALT  Date Value Ref Range Status  07/15/2019 20 0 - 44 IU/L Final         Passed - Cr in normal range and within 360 days    Creatinine, Ser  Date Value Ref Range Status  03/11/2022 1.09 0.76 - 1.27 mg/dL Final   Creatinine, POC  Date Value Ref Range Status  03/09/2018 50 mg/dL Final         Passed - eGFR is 30 or above and within 360 days    GFR calc Af Amer  Date Value Ref Range Status  10/19/2020 88 >59 mL/min/1.73 Final    Comment:    **In accordance with recommendations from the NKF-ASN Task force,**   [facility] is in the process of updating its eGFR calculation to the   2021 CKD-EPI creatinine equation that estimates kidney function   without a race variable.    GFR calc non Af Amer  Date Value Ref Range Status  10/19/2020 76 >59 mL/min/1.73  Final   eGFR  Date Value Ref Range Status  03/11/2022 72 >59 mL/min/1.73 Final         Passed - Patient is not pregnant      Passed - Valid encounter within last 12 months    Recent Outpatient Visits           3 months ago Rotator cuff arthropathy, left   [facility] Montel Culver, MD   4 months ago Rotator cuff arthropathy, left   [facility] Montel Culver, MD   5 months ago Primary osteoarthritis of right hip   [facility] Montel Culver, MD

## 2022-06-20 ENCOUNTER — Ambulatory Visit
Payer: PPO | Attending: Student in an Organized Health Care Education/Training Program | Admitting: Student in an Organized Health Care Education/Training Program

## 2022-06-20 ENCOUNTER — Encounter: Payer: Self-pay | Admitting: Student in an Organized Health Care Education/Training Program

## 2022-06-20 DIAGNOSIS — G8929 Other chronic pain: Secondary | ICD-10-CM | POA: Diagnosis present

## 2022-06-20 DIAGNOSIS — M25512 Pain in left shoulder: Secondary | ICD-10-CM | POA: Insufficient documentation

## 2022-06-20 DIAGNOSIS — M19012 Primary osteoarthritis, left shoulder: Secondary | ICD-10-CM | POA: Insufficient documentation

## 2022-06-20 DIAGNOSIS — G5601 Carpal tunnel syndrome, right upper limb: Secondary | ICD-10-CM | POA: Insufficient documentation

## 2022-06-20 DIAGNOSIS — G894 Chronic pain syndrome: Secondary | ICD-10-CM | POA: Diagnosis present

## 2022-06-20 DIAGNOSIS — E1142 Type 2 diabetes mellitus with diabetic polyneuropathy: Secondary | ICD-10-CM | POA: Insufficient documentation

## 2022-06-20 MED ORDER — HYDROCODONE-ACETAMINOPHEN 5-325 MG PO TABS: 1.0000 | ORAL_TABLET | Freq: Two times a day (BID) | ORAL | 0 refills | Status: DC | PRN

## 2022-06-20 NOTE — Progress Notes (Signed)
PROVIDER NOTE: Information contained herein reflects review and annotations entered in association with encounter. Interpretation of such information and data should be left to medically-trained personnel. Information provided to patient can be located elsewhere in the medical record under "Patient Instructions". Document created using STT-dictation technology, any transcriptional errors that may result from process are unintentional.    Patient: Gregory Harrison  Service Category: E/M  Provider: Gillis Santa, MD  DOB: 1951/03/11  DOS: 06/20/2022  Specialty: Interventional Pain Management  MRN: [id number]  Setting: Ambulatory outpatient  PCP: Orvis Brill, DO  Type: Established Patient    Referring Provider: [name], MD  Location: Office  Delivery: Face-to-face     HPI  [name], a 71 y.o. year old male, is here today because of his No primary diagnosis found.. Mr. Sackrider's primary complain today is Back Pain (lower) Last encounter: My last encounter with him was on 03/14/22  Pertinent problems: Mr. Buchberger has Type 2 diabetes mellitus with diabetic polyneuropathy, without long-term current use of insulin ([place]); Chronic left shoulder pain; Carpal tunnel syndrome of right wrist; Trigger finger of right thumb; Lumbar facet arthropathy; Lumbar degenerative disc disease; Bilateral primary osteoarthritis of knee; Primary osteoarthritis of left shoulder; and Trigger finger, right little finger on their pertinent problem list. Pain Assessment: Severity of Chronic pain is reported as a 9 /10. Location: Back Lower/both legs. Onset: More than a month ago. Quality: Other (Comment) (stiff). Timing: Intermittent. Modifying factor(s): movement, Hydrocodone. Vitals:  height is _0  (1.88 m) and weight is 175 lb (79.4 kg). His temporal temperature is 97.3 F (36.3 C) (abnormal). His blood pressure is 159/81 (abnormal) and his pulse is 63. His respiration is 16 and oxygen saturation is 99%.   Reason for  encounter: medication management   [name] presents today for medication management.  To have increased pain in his left shoulder and is wondering if he should contact Dr. Zigmund Daniel for a shoulder injection.  Possible procedure.  States that he has gone back to working approximately 2 days a week. No side effects with his medication.  Denies any constipation or nausea.  Primarily.  Still has 1 prescription that he can fill at his pharmacy.    Pharmacotherapy Assessment  Analgesic: Hydrocodone 5 mg twice daily as needed, quantity 60/month MME equals 10    Monitoring: [place] PMP: PDMP reviewed during this encounter.       Pharmacotherapy: No side-effects or adverse reactions reported. Compliance: No problems identified. Effectiveness: Clinically acceptable.  Landis Martins, RN  06/20/2022  8:19 AM  Sign when Signing Visit Nursing Pain Medication Assessment:  Safety precautions to be maintained throughout the outpatient stay will include: orient to surroundings, keep bed in low position, maintain call bell within reach at all times, provide assistance with transfer out of bed and ambulation. Nursing Pain Medication Assessment:  Safety precautions to be maintained throughout the outpatient stay will include: orient to surroundings, keep bed in low position, maintain call bell within reach at all times, provide assistance with transfer out of bed and ambulation.  Medication Inspection Compliance: Pill count conducted under aseptic conditions, in front of the patient. Neither the pills nor the bottle was removed from the patient's sight at any time. Once count was completed pills were immediately returned to the patient in their original bottle.  Medication: Hydrocodone/APAP Pill/Patch Count:  51 of 61 pills remain Pill/Patch Appearance: Markings consistent with prescribed medication Bottle Appearance: Standard pharmacy container. Clearly labeled. Filled Date: 06 / 27 /  2023 Last Medication intake:   Yesterday   UDS:  Summary  Date Value Ref Range Status  03/14/2022 Note  Final    Comment:    ==================================================================== [employer] Select 13 (MW) ==================================================================== Test                             Result       Flag       Units  Drug Absent but Declared for Prescription Verification   Hydrocodone                    Not Detected UNEXPECTED ng/mg creat ==================================================================== Test                      Result    Flag   Units      Ref Range   Creatinine              45               mg/dL      >=20 ==================================================================== Declared Medications:  The flagging and interpretation on this report are based on the  following declared medications.  Unexpected results may arise from  inaccuracies in the declared medications.   **Note: The testing scope of this panel includes these medications:   Hydrocodone (Norco)   **Note: The testing scope of this panel does not include the  following reported medications:   Acetaminophen (Norco)  Amlodipine (Norvasc)  Atorvastatin (Lipitor)  Gabapentin (Neurontin)  Hydrochlorothiazide (Hydrodiuril)  Insulin (Basaglar)  Losartan (Cozaar)  Meloxicam (Mobic)  Metformin (Glucophage)  Sitagliptin (Januvia)  Topical ==================================================================== For clinical consultation, please call [phone]. ====================================================================      ROS  Constitutional: Denies any fever or chills Gastrointestinal: No reported hemesis, hematochezia, vomiting, or acute GI distress Musculoskeletal:  Left shoulder pain Neurological: No reported episodes of acute onset apraxia, aphasia, dysarthria, agnosia, amnesia, paralysis, loss of coordination, or loss of consciousness  Medication Review  Alcohol Swabs,  Basaglar KwikPen, HYDROcodone-acetaminophen, Insulin Pen Needle, ONE TOUCH DELICA LANCING DEV, OneTouch Delica Lancets Fine, OneTouch Verio, OneTouch Verio Reflect, amLODipine, atorvastatin, blood glucose meter kit and supplies, empagliflozin, gabapentin, glucose blood, hydrochlorothiazide, losartan, meloxicam, and metFORMIN  History Review  Allergy: Mr. Rowlette has No Known Allergies. Drug: Mr. Iwan  reports that he does not currently use drugs after having used the following drugs: Marijuana. Alcohol:  reports that he does not currently use alcohol. Tobacco:  reports that he has quit smoking. His smoking use included cigarettes. He has a 40.00 pack-year smoking history. He has never used smokeless tobacco. Social: Mr. Breau  reports that he has quit smoking. His smoking use included cigarettes. He has a 40.00 pack-year smoking history. He has never used smokeless tobacco. He reports that he does not currently use alcohol. He reports that he does not currently use drugs after having used the following drugs: Marijuana. Medical:  has a past medical history of Arthritis, Diabetes ([place]) (2000), Diabetic neuropathy ([place]), GERD (gastroesophageal reflux disease), HTN (hypertension) (2010), Hypercholesterolemia, Vision problem, and Weight loss (07/16/2019). Surgical: Mr. Caudillo  has a past surgical history that includes Hand surgery (1980s); Nose surgery (1979); and Eye surgery (Left, 1990s). Family: family history includes ALS in his brother; Diabetes in his paternal grandfather; Heart attack in his father; Heart murmur in his father; Other in his mother.  Laboratory Chemistry Profile   Renal Lab Results  Component Value Date  BUN 12 03/11/2022   CREATININE 1.09 03/11/2022   BCR 11 03/11/2022   GFRAA 88 10/19/2020   GFRNONAA 76 10/19/2020    Hepatic Lab Results  Component Value Date   AST 16 07/15/2019   ALT 20 07/15/2019   ALBUMIN 4.9 (H) 07/15/2019   ALKPHOS 64 07/15/2019     Electrolytes Lab Results  Component Value Date   NA 144 03/11/2022   K 4.5 03/11/2022   CL 106 03/11/2022   CALCIUM 9.3 03/11/2022    Bone No results found for: "VD25OH", "VD125OH2TOT", "LK4401UU7", "OZ3664QI3", "25OHVITD1", "25OHVITD2", "25OHVITD3", "TESTOFREE", "TESTOSTERONE"  Inflammation (CRP: Acute Phase) (ESR: Chronic Phase) No results found for: "CRP", "ESRSEDRATE", "LATICACIDVEN"       Note: Above Lab results reviewed.  Recent Imaging Review  [place] LIMITED JOINT SPACE STRUCTURES UP LEFT Procedure:  Injection of left subacromial space under ultrasound guidance. Ultrasound guidance utilized for visualization of the left subacromial  space, hypoechoic region throughout the supraspinatus most consistent with  tendinosis/degenerative changes, hypoechoic response from injectate noted Samsung HS60 device utilized with permanent recording / reporting. Verbal informed consent obtained and verified. Skin prepped in a sterile fashion. Ethyl chloride for topical local analgesia.  Completed without difficulty and tolerated well. Medication: triamcinolone acetonide 40 mg/mL suspension for injection 1 mL  total and 2 mL lidocaine 1% without epinephrine utilized for needle  placement anesthetic Advised to contact for fevers/chills, erythema, induration, drainage, or  persistent bleeding. Note: Reviewed        Physical Exam  General appearance: Well nourished, well developed, and well hydrated. In no apparent acute distress Mental status: Alert, oriented x 3 (person, place, & time)       Respiratory: No evidence of acute respiratory distress Eyes: PERLA Vitals: BP (!) 159/81   Pulse 63   Temp (!) 97.3 F (36.3 C) (Temporal)   Resp 16   Ht _0  (1.88 m)   Wt 175 lb (79.4 kg)   SpO2 99%   BMI 22.47 kg/m  BMI: Estimated body mass index is 22.47 kg/m as calculated from the following:   Height as of this encounter: _1  (1.88 m).   Weight as of this encounter: 175 lb (79.4  kg). Ideal: Ideal body weight: 82.2 kg (181 lb 3.5 oz)  Upper Extremity (UE) Exam    Side: Right upper extremity  Side: Left upper extremity  Skin & Extremity Inspection: Skin color, temperature, and hair growth are WNL. No peripheral edema or cyanosis. No masses, redness, swelling, asymmetry, or associated skin lesions. No contractures.  Skin & Extremity Inspection: Skin color, temperature, and hair growth are WNL. No peripheral edema or cyanosis. No masses, redness, swelling, asymmetry, or associated skin lesions. No contractures.  Functional ROM: Unrestricted ROM          Functional ROM: Pain restricted ROM          Muscle Tone/Strength: Functionally intact. No obvious neuro-muscular anomalies detected.  Muscle Tone/Strength: Functionally intact. No obvious neuro-muscular anomalies detected.  Sensory (Neurological): Unimpaired          Sensory (Neurological): Unimpaired          Palpation: No palpable anomalies              Palpation: No palpable anomalies              Provocative Test(s):  Phalen's test: deferred Tinel's test: deferred Apley's scratch test (touch opposite shoulder):  Action 1 (Across chest): deferred Action 2 (Overhead): deferred Action 3 (LB reach):  deferred   Provocative Test(s):  Phalen's test: deferred Tinel's test: deferred Apley's scratch test (touch opposite shoulder):  Action 1 (Across chest): Decreased ROM Action 2 (Overhead): Decreased ROM Action 3 (LB reach): Decreased ROM      Assessment   Diagnosis  1. Primary osteoarthritis of left shoulder   2. Diabetic polyneuropathy associated with type 2 diabetes mellitus (HCC)   3. Carpal tunnel syndrome of right wrist   4. Chronic left shoulder pain   5. Chronic pain syndrome        Plan of Care   1. Primary osteoarthritis of left shoulder - HYDROcodone-acetaminophen (NORCO/VICODIN) 5-325 MG tablet; Take 1 tablet by mouth 2 (two) times daily as needed for moderate pain. For chronic pain syndrome   Dispense: 60 tablet; Refill: 0 - HYDROcodone-acetaminophen (NORCO/VICODIN) 5-325 MG tablet; Take 1 tablet by mouth 2 (two) times daily as needed for moderate pain. For chronic pain syndrome  Dispense: 60 tablet; Refill: 0  2. Diabetic polyneuropathy associated with type 2 diabetes mellitus (HCC) - HYDROcodone-acetaminophen (NORCO/VICODIN) 5-325 MG tablet; Take 1 tablet by mouth 2 (two) times daily as needed for moderate pain. For chronic pain syndrome  Dispense: 60 tablet; Refill: 0 - HYDROcodone-acetaminophen (NORCO/VICODIN) 5-325 MG tablet; Take 1 tablet by mouth 2 (two) times daily as needed for moderate pain. For chronic pain syndrome  Dispense: 60 tablet; Refill: 0  3. Carpal tunnel syndrome of right wrist - HYDROcodone-acetaminophen (NORCO/VICODIN) 5-325 MG tablet; Take 1 tablet by mouth 2 (two) times daily as needed for moderate pain. For chronic pain syndrome  Dispense: 60 tablet; Refill: 0 - HYDROcodone-acetaminophen (NORCO/VICODIN) 5-325 MG tablet; Take 1 tablet by mouth 2 (two) times daily as needed for moderate pain. For chronic pain syndrome  Dispense: 60 tablet; Refill: 0  4. Chronic left shoulder pain - HYDROcodone-acetaminophen (NORCO/VICODIN) 5-325 MG tablet; Take 1 tablet by mouth 2 (two) times daily as needed for moderate pain. For chronic pain syndrome  Dispense: 60 tablet; Refill: 0 - HYDROcodone-acetaminophen (NORCO/VICODIN) 5-325 MG tablet; Take 1 tablet by mouth 2 (two) times daily as needed for moderate pain. For chronic pain syndrome  Dispense: 60 tablet; Refill: 0  5. Chronic pain syndrome - HYDROcodone-acetaminophen (NORCO/VICODIN) 5-325 MG tablet; Take 1 tablet by mouth 2 (two) times daily as needed for moderate pain. For chronic pain syndrome  Dispense: 60 tablet; Refill: 0 - HYDROcodone-acetaminophen (NORCO/VICODIN) 5-325 MG tablet; Take 1 tablet by mouth 2 (two) times daily as needed for moderate pain. For chronic pain syndrome  Dispense: 60 tablet; Refill:  0   Follow-up with Dr. Zigmund Daniel for left shoulder steroid injection.   Pharmacotherapy (Medications Ordered): Meds ordered this encounter  Medications   HYDROcodone-acetaminophen (NORCO/VICODIN) 5-325 MG tablet    Sig: Take 1 tablet by mouth 2 (two) times daily as needed for moderate pain. For chronic pain syndrome    Dispense:  60 tablet    Refill:  0   HYDROcodone-acetaminophen (NORCO/VICODIN) 5-325 MG tablet    Sig: Take 1 tablet by mouth 2 (two) times daily as needed for moderate pain. For chronic pain syndrome    Dispense:  60 tablet    Refill:  0   Orders:  No orders of the defined types were placed in this encounter.  Follow-up plan:   Return in about 3 months (around 09/20/2022) for Medication Management, in person.    Recent Visits No visits were found meeting these conditions. Showing recent visits within past 90 days and meeting all other requirements Today's Visits  Date Type Provider Dept  06/20/22 Office Visit Gillis Santa, MD [facility]  Showing today's visits and meeting all other requirements Future Appointments Date Type Provider Dept  09/12/22 Appointment Gillis Santa, MD [facility]  Showing future appointments within next 90 days and meeting all other requirements  I discussed the assessment and treatment plan with the patient. The patient was provided an opportunity to ask questions and all were answered. The patient agreed with the plan and demonstrated an understanding of the instructions.  Patient advised to call back or seek an in-person evaluation if the symptoms or condition worsens.  Duration of encounter: 66mnutes.  Note by: BGillis Santa MD Date: 06/20/2022; Time: 10:07 AM

## 2022-06-20 NOTE — Progress Notes (Signed)
Nursing Pain Medication Assessment:  Safety precautions to be maintained throughout the outpatient stay will include: orient to surroundings, keep bed in low position, maintain call bell within reach at all times, provide assistance with transfer out of bed and ambulation. Nursing Pain Medication Assessment:  Safety precautions to be maintained throughout the outpatient stay will include: orient to surroundings, keep bed in low position, maintain call bell within reach at all times, provide assistance with transfer out of bed and ambulation.  Medication Inspection Compliance: Pill count conducted under aseptic conditions, in front of the patient. Neither the pills nor the bottle was removed from the patient's sight at any time. Once count was completed pills were immediately returned to the patient in their original bottle.  Medication: Hydrocodone/APAP Pill/Patch Count:  51 of 61 pills remain Pill/Patch Appearance: Markings consistent with prescribed medication Bottle Appearance: Standard pharmacy container. Clearly labeled. Filled Date: 06 / 27 / 2023 Last Medication intake:  Yesterday

## 2022-06-20 NOTE — Patient Instructions (Signed)

## 2022-07-05 LAB — HM DIABETES EYE EXAM

## 2022-07-10 ENCOUNTER — Telehealth: Payer: Self-pay | Admitting: Family Medicine

## 2022-07-10 DIAGNOSIS — M1611 Unilateral primary osteoarthritis, right hip: Secondary | ICD-10-CM

## 2022-07-10 DIAGNOSIS — M1612 Unilateral primary osteoarthritis, left hip: Secondary | ICD-10-CM

## 2022-07-10 DIAGNOSIS — M12812 Other specific arthropathies, not elsewhere classified, left shoulder: Secondary | ICD-10-CM

## 2022-07-11 NOTE — Telephone Encounter (Signed)
Please advise 

## 2022-07-11 NOTE — Telephone Encounter (Signed)
Requested medication (s) are due for refill today:yes  Requested medication (s) are on the active medication list: yes  Last refill:  06/05/22  Future visit scheduled: no  Notes to clinic:  Unable to refill per protocol due to failed labs, no updated results.      Requested Prescriptions  Pending Prescriptions Disp Refills   meloxicam (MOBIC) 7.5 MG tablet [Pharmacy Med Name: MELOXICAM 7.5 MG TABLET] 30 tablet 0    Sig: TAKE 1 TABLET BY MOUTH EVERY DAY AS NEEDED FOR PAIN     Analgesics:  COX2 Inhibitors Failed - 07/10/2022  2:28 AM      Failed - Manual Review: Labs are only required if the patient has taken medication for more than 8 weeks.      Failed - HGB in normal range and within 360 days    Hemoglobin  Date Value Ref Range Status  07/15/2019 14.4 13.0 - 17.7 g/dL Final         Failed - HCT in normal range and within 360 days    Hematocrit  Date Value Ref Range Status  07/15/2019 44.1 37.5 - 51.0 % Final         Failed - AST in normal range and within 360 days    AST  Date Value Ref Range Status  07/15/2019 16 0 - 40 IU/L Final         Failed - ALT in normal range and within 360 days    ALT  Date Value Ref Range Status  07/15/2019 20 0 - 44 IU/L Final         Passed - Cr in normal range and within 360 days    Creatinine, Ser  Date Value Ref Range Status  03/11/2022 1.09 0.76 - 1.27 mg/dL Final   Creatinine, POC  Date Value Ref Range Status  03/09/2018 50 mg/dL Final         Passed - eGFR is 30 or above and within 360 days    GFR calc Af Amer  Date Value Ref Range Status  10/19/2020 88 >59 mL/min/1.73 Final    Comment:    **In accordance with recommendations from the NKF-ASN Task force,**   [facility] is in the process of updating its eGFR calculation to the   2021 CKD-EPI creatinine equation that estimates kidney function   without a race variable.    GFR calc non Af Amer  Date Value Ref Range Status  10/19/2020 76 >59 mL/min/1.73 Final   eGFR   Date Value Ref Range Status  03/11/2022 72 >59 mL/min/1.73 Final         Passed - Patient is not pregnant      Passed - Valid encounter within last 12 months    Recent Outpatient Visits           4 months ago Rotator cuff arthropathy, left   [facility] Montel Culver, MD   6 months ago Rotator cuff arthropathy, left   [facility] Montel Culver, MD   6 months ago Primary osteoarthritis of right hip   [facility] Montel Culver, MD

## 2022-07-11 NOTE — Telephone Encounter (Signed)
Called pt left VM to call back.  KP 

## 2022-07-15 NOTE — Telephone Encounter (Signed)
Pt returned call for scheduling, understands to hold on meloxicam until appt

## 2022-07-29 ENCOUNTER — Telehealth: Payer: Self-pay | Admitting: Student

## 2022-07-29 NOTE — Telephone Encounter (Signed)
Called pt left VM to call back.  KP 

## 2022-07-29 NOTE — Telephone Encounter (Signed)
Copied from [place] [id number]. Topic: General - Call Back - No Documentation >> Jul 29, 2022 10:21 AM Sabas Sous wrote: Reason for CRM: Pt called and wants to know if he needs to reschedule his injection appt. He has a procedure scheduled to have a knot removed from his hand.  Best contact: [phone]

## 2022-07-30 ENCOUNTER — Encounter: Payer: Self-pay | Admitting: Family Medicine

## 2022-07-30 ENCOUNTER — Other Ambulatory Visit: Payer: Self-pay | Admitting: Family Medicine

## 2022-07-30 ENCOUNTER — Inpatient Hospital Stay (INDEPENDENT_AMBULATORY_CARE_PROVIDER_SITE_OTHER): Payer: PPO | Admitting: Radiology

## 2022-07-30 ENCOUNTER — Ambulatory Visit (INDEPENDENT_AMBULATORY_CARE_PROVIDER_SITE_OTHER): Payer: PPO | Admitting: Family Medicine

## 2022-07-30 VITALS — BP 140/80 | HR 88 | Ht 74.0 in | Wt 169.4 lb

## 2022-07-30 DIAGNOSIS — M25811 Other specified joint disorders, right shoulder: Secondary | ICD-10-CM

## 2022-07-30 DIAGNOSIS — M7711 Lateral epicondylitis, right elbow: Secondary | ICD-10-CM | POA: Diagnosis not present

## 2022-07-30 DIAGNOSIS — E7800 Pure hypercholesterolemia, unspecified: Secondary | ICD-10-CM

## 2022-07-30 DIAGNOSIS — M1612 Unilateral primary osteoarthritis, left hip: Secondary | ICD-10-CM

## 2022-07-30 DIAGNOSIS — M1611 Unilateral primary osteoarthritis, right hip: Secondary | ICD-10-CM

## 2022-07-30 DIAGNOSIS — I10 Essential (primary) hypertension: Secondary | ICD-10-CM

## 2022-07-30 DIAGNOSIS — M12812 Other specific arthropathies, not elsewhere classified, left shoulder: Secondary | ICD-10-CM

## 2022-07-30 HISTORY — DX: Other specified joint disorders, right shoulder: M25.811

## 2022-07-30 NOTE — Assessment & Plan Note (Signed)
Chronic issue with recurrent symptomatology, had injection years ago and is requesting the same. Exam shows full ROM with pain during supraspinatus resisted testing, tenderness to the subacromial space laterally and bicipital groove. Positive impingement, equivocal Speed's and Yergason's. Will discontinue meloxicam, he chose to proceed with ultrasound-guided subacromial corticosteroid injection, Can follow-up PRN.

## 2022-07-30 NOTE — Assessment & Plan Note (Signed)
Chronic issue with more severe symptoms in the setting of worsened comorbid ipsilateral shoulder pain. Exam consistent with lateral epicondylitis, reviewed medication, rest, injection options and he elected to proceed with ultrasound-guided lateral epicondyle injection with cortisone. Post-care reviewed, discontinue meloxicam.

## 2022-07-30 NOTE — Patient Instructions (Addendum)
You have just been given a cortisone injection to reduce pain and inflammation. After the injection you may notice immediate relief of pain as a result of the Lidocaine. It is important to rest the area of the injection for 24 to 48 hours after the injection. There is a possibility of some temporary increased discomfort and swelling for up to 72 hours until the cortisone begins to work. If you do have pain, simply rest the joint and use ice. If you can tolerate over the counter medications, you can try Tylenol, Aleve, or Advil for added relief per package instructions. -Can discontinue meloxicam - Contact for questions and follow-up as needed

## 2022-07-30 NOTE — Progress Notes (Signed)
     Primary Care / Sports Medicine Office Visit  Patient Information:  Patient ID: Gregory Harrison, male DOB: [birth year] Age: 71 y.o. MRN: [id number]   Gregory Harrison is a pleasant 71 y.o. male presenting with the following:  Chief Complaint  Patient presents with   Rotator cuff arthropathy, left    Vitals:   07/30/22 1410  BP: (!) 140/80  Pulse: 88  SpO2: 98%   Vitals:   07/30/22 1410  Weight: 169 lb 6.4 oz (76.8 kg)  Height: '6\' 2"'$  (1.88 m)   Body mass index is 21.75 kg/m.     Independent interpretation of notes and tests performed by another provider:   None  Procedures performed:   Procedure:  Injection of right subacromial space under ultrasound guidance. Ultrasound guidance utilized for in-place approach, echogenicity of supraspinatus tendon consistent with tendinopathy Samsung HS60 device utilized with permanent recording / reporting. Verbal informed consent obtained and verified. Skin prepped in a sterile fashion. Ethyl chloride for topical local analgesia.  Completed without difficulty and tolerated well. Medication: triamcinolone acetonide 40 mg/mL suspension for injection 1 mL total and 2 mL lidocaine 1% without epinephrine utilized for needle placement anesthetic Advised to contact for fevers/chills, erythema, induration, drainage, or persistent bleeding.  Procedure:  Injection of right lateral epicondyle under ultrasound guidance. Ultrasound guidance utilized for out-of-plane approach, no effusion Samsung HS60 device utilized with permanent recording / reporting. Verbal informed consent obtained and verified. Skin prepped in a sterile fashion. Ethyl chloride for topical local analgesia.  Completed without difficulty and tolerated well. Medication: triamcinolone acetonide 40 mg/mL suspension for injection 1 mL total and 2 mL lidocaine 1% without epinephrine utilized for needle placement anesthetic Advised to contact for fevers/chills, erythema, induration,  drainage, or persistent bleeding.   Pertinent History, Exam, Impression, and Recommendations:   Problem List Items Addressed This Visit       Musculoskeletal and Integument   Lateral epicondylitis, right elbow    Chronic issue with more severe symptoms in the setting of worsened comorbid ipsilateral shoulder pain. Exam consistent with lateral epicondylitis, reviewed medication, rest, injection options and he elected to proceed with ultrasound-guided lateral epicondyle injection with cortisone. Post-care reviewed, discontinue meloxicam.      Relevant Orders   [place] LIMITED JOINT SPACE STRUCTURES UP RIGHT (Completed)     Other   Impingement of right shoulder - Primary    Chronic issue with recurrent symptomatology, had injection years ago and is requesting the same. Exam shows full ROM with pain during supraspinatus resisted testing, tenderness to the subacromial space laterally and bicipital groove. Positive impingement, equivocal Speed's and Yergason's. Will discontinue meloxicam, he chose to proceed with ultrasound-guided subacromial corticosteroid injection, Can follow-up PRN.      Relevant Orders   [place] LIMITED JOINT SPACE STRUCTURES UP RIGHT (Completed)     Orders & Medications No orders of the defined types were placed in this encounter.  Orders Placed This Encounter  Procedures   [place] LIMITED JOINT SPACE STRUCTURES UP RIGHT     Return if symptoms worsen or fail to improve.     Montel Culver, MD   Primary Care Sports Medicine [place]

## 2022-08-08 ENCOUNTER — Other Ambulatory Visit: Payer: Self-pay | Admitting: Family Medicine

## 2022-08-08 ENCOUNTER — Ambulatory Visit (INDEPENDENT_AMBULATORY_CARE_PROVIDER_SITE_OTHER): Payer: PPO | Admitting: Family Medicine

## 2022-08-08 VITALS — BP 174/77 | HR 88 | Temp 98.6°F | Ht 74.0 in | Wt 162.4 lb

## 2022-08-08 DIAGNOSIS — E1142 Type 2 diabetes mellitus with diabetic polyneuropathy: Secondary | ICD-10-CM | POA: Diagnosis not present

## 2022-08-08 DIAGNOSIS — J309 Allergic rhinitis, unspecified: Secondary | ICD-10-CM

## 2022-08-08 DIAGNOSIS — R053 Chronic cough: Secondary | ICD-10-CM

## 2022-08-08 DIAGNOSIS — R052 Subacute cough: Secondary | ICD-10-CM | POA: Diagnosis not present

## 2022-08-08 DIAGNOSIS — R634 Abnormal weight loss: Secondary | ICD-10-CM | POA: Diagnosis not present

## 2022-08-08 LAB — POCT GLYCOSYLATED HEMOGLOBIN (HGB A1C): HbA1c, POC (controlled diabetic range): 11.4 % — AB (ref 0.0–7.0)

## 2022-08-08 MED ORDER — AZELASTINE-FLUTICASONE 137-50 MCG/ACT NA SUSP: 1.0000 | Freq: Two times a day (BID) | NASAL | 2 refills | Status: DC

## 2022-08-08 MED ORDER — AZELASTINE-FLUTICASONE 137-50 MCG/ACT NA SUSP: NASAL | 2 refills | Status: DC

## 2022-08-08 NOTE — Progress Notes (Signed)
Ambulatory walk  96% 92BPM

## 2022-08-08 NOTE — Patient Instructions (Addendum)
I am concerned about your cough and weight loss of 18 pounds since April.  Plan on Chest CAT scan  We are checking some basic labs.    Start a nasal spray (Azelastine - fluticasone) 1 spray into each nostril twice a day.

## 2022-08-08 NOTE — Progress Notes (Signed)
Gregory Harrison is alone Sources of clinical information for visit is/are patient and past medical records. Nursing assessment for this office visit was reviewed with the patient for accuracy and revision.     Previous Report(s) Reviewed: lab reports and radiology reports, PDMP, last pain clinic notes    08/08/2022   10:24 AM  Depression screen PHQ 2/9  Decreased Interest 0  Down, Depressed, Hopeless 0  PHQ - 2 Score 0  Altered sleeping 1  Tired, decreased energy 1  Change in appetite 1  Feeling bad or failure about yourself  0  Trouble concentrating 0  Moving slowly or fidgety/restless 0  Suicidal thoughts 0  PHQ-9 Score 3  Difficult doing work/chores Somewhat difficult   [place] Visit from 08/08/2022 in [place] Office Visit from 03/11/2022 in [place] Office Visit from 03/07/2022 in [place] [facility] at [place] that you would be better off dead, or of hurting yourself in some way Not at all Not at all Not at all  PHQ-9 Total Score 3 0 0          08/08/2022   10:25 AM 06/20/2022    8:16 AM 03/14/2022    8:42 AM 03/07/2022   10:48 AM 01/08/2022   11:00 AM  [place] in the past year? 0 0 0 0 1  Number falls in past yr: 0  0  0  Injury with Fall? 0   0 1  Risk for fall due to :   No Fall Risks  History of fall(s);Orthopedic patient  Follow up   Falls evaluation completed  Falls evaluation completed       08/08/2022   10:24 AM 06/20/2022    8:16 AM 03/11/2022    2:19 PM  PHQ9 SCORE ONLY  PHQ-9 Total Score 3 0 0    Adult vaccines due  Topic Date Due   TETANUS/TDAP  Never done    Health Maintenance Due  Topic Date Due   TETANUS/TDAP  Never done   Zoster Vaccines- Shingrix (1 of 2) Never done   COLONOSCOPY (Pts 45-11yr Insurance coverage will need to be confirmed)  09/05/2021   FOOT EXAM  02/13/2022   INFLUENZA VACCINE  07/09/2022      History/P.E.  limitations: none  Adult vaccines due  Topic Date Due   TETANUS/TDAP  Never done    Diabetes Health Maintenance Due  Topic Date Due   FOOT EXAM  02/13/2022   HEMOGLOBIN A1C  02/06/2023   OPHTHALMOLOGY EXAM  07/06/2023    Health Maintenance Due  Topic Date Due   TETANUS/TDAP  Never done   Zoster Vaccines- Shingrix (1 of 2) Never done   COLONOSCOPY (Pts 45-420yrInsurance coverage will need to be confirmed)  09/05/2021   FOOT EXAM  02/13/2022   INFLUENZA VACCINE  07/09/2022     Chief Complaint  Patient presents with   Cough     --------------------------------------------------------------------------------------------------------------------------------------------- Visit Problem List with A/P  No problem-specific Assessment & Plan notes found for this encounter.    Unintentional Weight Loss in Older Adults  Diet Type: general;   Average meal intake, recent: good  Fraility:  No prior signs of frailty    Medication effects: Empagliflozin, Cefdinir, opiates, Meloxicam ( Level of physical activity:  independent in ADL's, less physical activity over last month since start of coughing-related illness ;  independent  Polypharmacy (> 4 medications):  Yes   Malignancy Hx*: No .  Patient had skin biopsy from hand dorsum by derm couple weeks ago.  Path pending.   Ongoing inflammatory or increased catabolic condition:  Yes, inadequate glycemic control  Recent acute Illness: Yes , Chest cold onset about month ago.  Others in family have had it.  Cough continues.  Emotional problems, especially depression*: No   Alcoholism / Substance Abuse:  distant alcohol use problem Swallowing disorders:  Feels like solid foods get stuck in neck intermittently Meds associated with dysphagia Meloxicam Dysgeusia:No  Oral factors (e.g., poorly fitting dentures, caries):  upper and lower plate dentures. Patient is edentulous.   Food insecurity: No   Hyperthyroidism, hypothyroidism,  hyperparathyroidism/hypercalcemia, hypoadrenalism:No    GI Disorders Hx* (Hiatal Hernia, Ischemic bowel, IBD, pancreatic insufficiency, PUD, GERD, celiac disease): No  He does had diabetic polyneuropathy  Nausea and/or Vomiting: No   GI/Biliary surgeries: No   Eating problems (e.g., inability to feed self): No   Dental or denture problems: No   Low-salt, low-cholesterol diet: No   Stones, social problems (e.g., isolation, inability to obtain preferred foods): No   Cognitive impairment*: No   Immunocompromised:  Uncontrolled diabetes mellitus  Diabetes:Yes ; Control: No  A1c 11.4%  Organ Failure (Cardiac, Respiratory, Renal, Liver): No    Autoimmune Disorders (RA, SLE, etc): No    Neurologic Conditions (Stroke, Parkinsons, Chronic Pain, Dementia): No   Symptoms:  General Thirst: No  Fever: No  Night Sweats:Yes   Rigors: No  Fatigue: Yes    HEENT Headache (Temporal Arteritis): No  Loss of sense of smell: No  Head cold symptoms:  Rhinorrhea and PND Oral sores / bad teeth: No  If dentures, well-fitting: No  Cardiovascular Abdominal pain with eating: No  Heart Failure Hx: No  Dyspnea on exertion: Yes  Exertional pulse ox with 150 ft walk 97% on room air Respiratory Pulmonary Disease Hx: No  Dyspnea:  Yes  Cough: Yes  Gastrointestinal History of peptic ulcers:  No  Hisotry of GERD: Yes   Indigestion/heartburn: No  Epigastric Pain: No  Hematemesis: No  Nausea and/or Vomiting: No  Melena: No  Hematochezia: No  Abdomina Swelling: No  Bloating: No  Diarrhea: No  Constipation: No  Genitourinary Dysuria: No  Frequency: No  Urinary hesitancy: No  New urinary incontinence: No  Hematologic Anemia History: No  Swollen lymph nodes: No  Musculoskeletal Shoulder stiffness:  No  Muscle strength decrease:  generalized weak Joint Swelling/pain: No  Neuropsychologic Prolonged sadness: No  Loss of pleasure: No    COUGH Onset: about a month ago after a chest  cold - other family members had same symptoms  Course stable   Severity: moderately severe  Worse with: laying down, more mucus production when laying on left side   Sinusitis symptoms clear rhinorrhea, congestion, itchy eyes, and post nasal drip Double sickening: no  Allergy symptoms: nasal congestion, rhinorrhea, watery eyes, and cough    Symptoms Sputum:Yes, filling about half-cup each day  Fever: no  Shortness of breath:no  Leg Swelling:no  Heart Burn or Reflux:no  Wheezing:no  Post Nasal Drip: yes    Red Flags Weight Loss:  yes Immunocompromised:  yes, uncontrolled diabetes Stiff neck: no  Dyspnea: yes  Rash: no  Swallowing difficulty: yes   PMH Asthma or COPD: no  PMH of Smoking: yes, quit 1989  Using ACEIs: no     SH:  Social History   Tobacco Use  Smoking Status Former   Packs/day: 2.00  Years: 20.00   Total pack years: 40.00   Types: Cigarettes  Smokeless Tobacco Never  Tobacco Comments   1989    ROS: See HPI  PHYSICAL EXAM Vitals:   08/08/22 1026  BP: (!) 174/77  Pulse: 88  Temp: 98.6 F (37 C)  SpO2: 98%    General: alert, cooperative, no distress HEENT: Head: Normocephalic, no lesions, without obvious abnormality. Eye: Normal external eye, conjunctiva, lids cornea, PERL. Ears: Normal TM's bilaterally. Normal auditory canals and external ears. Non-tender. Nose: Normal external nose, mucus membranes and septum. Pharynx: Dental Hygiene adequate. Normal buccal mucosa. Normal pharynx. Neck / Thyroid: Supple, no masses, nodes, nodules or enlargement. Cor: Heart sounds are normal.  Regular rate and rhythm without murmur, gallop or rub. Lungs: clear to auscultation bilaterally Abdomin: flat, ND, NT, (+) BS, no hepatomegaly, no splenomegaly, no inguinal LAN Ext: No clubbing and No edema  Labs/Imaging Comprehensive Metabolic Panel:    Component Value Date/Time   NA 136 08/08/2022 1127   K 4.8 08/08/2022 1127   CL 97 08/08/2022 1127    CO2 22 08/08/2022 1127   BUN 23 08/08/2022 1127   CREATININE 1.11 08/08/2022 1127   GLUCOSE 459 (H) 08/08/2022 1127   CALCIUM 9.9 08/08/2022 1127   AST 17 08/08/2022 1127   ALT 31 08/08/2022 1127   ALKPHOS 69 08/08/2022 1127   BILITOT 0.5 08/08/2022 1127   PROT 6.7 08/08/2022 1127   ALBUMIN 4.2 08/08/2022 1127   CBC:    Component Value Date/Time   WBC 10.9 (H) 08/08/2022 1127   HGB 14.4 08/08/2022 1127   HCT 45.2 08/08/2022 1127   PLT 405 08/08/2022 1127   MCV 87 08/08/2022 1127   NEUTROABS 8.4 (H) 08/08/2022 1127   LYMPHSABS 1.7 08/08/2022 1127   EOSABS 0.1 08/08/2022 1127   BASOSABS 0.1 08/08/2022 1127   Lab Results  Component Value Date   TSH 1.650 08/08/2022   Normal serum LDH  Erythrocyte Sedimentation Rate     Component Value Date/Time   ESRSEDRATE 20 08/08/2022 1127

## 2022-08-09 ENCOUNTER — Encounter: Payer: Self-pay | Admitting: Family Medicine

## 2022-08-09 DIAGNOSIS — R052 Subacute cough: Secondary | ICD-10-CM

## 2022-08-09 HISTORY — DX: Subacute cough: R05.2

## 2022-08-09 LAB — CMP14+EGFR
ALT: 31 [IU]/L (ref 0–44)
AST: 17 [IU]/L (ref 0–40)
Albumin/Globulin Ratio: 1.7 (ref 1.2–2.2)
Albumin: 4.2 g/dL (ref 3.8–4.8)
Alkaline Phosphatase: 69 [IU]/L (ref 44–121)
BUN/Creatinine Ratio: 21 (ref 10–24)
BUN: 23 mg/dL (ref 8–27)
Bilirubin Total: 0.5 mg/dL (ref 0.0–1.2)
CO2: 22 mmol/L (ref 20–29)
Calcium: 9.9 mg/dL (ref 8.6–10.2)
Chloride: 97 mmol/L (ref 96–106)
Creatinine, Ser: 1.11 mg/dL (ref 0.76–1.27)
Globulin, Total: 2.5 g/dL (ref 1.5–4.5)
Glucose: 459 mg/dL — ABNORMAL HIGH (ref 70–99)
Potassium: 4.8 mmol/L (ref 3.5–5.2)
Sodium: 136 mmol/L (ref 134–144)
Total Protein: 6.7 g/dL (ref 6.0–8.5)
eGFR: 70 mL/min/{1.73_m2}

## 2022-08-09 LAB — CBC WITH DIFFERENTIAL/PLATELET
Basophils Absolute: 0.1 10*3/uL (ref 0.0–0.2)
Basos: 1 %
EOS (ABSOLUTE): 0.1 10*3/uL (ref 0.0–0.4)
Eos: 1 %
Hematocrit: 45.2 % (ref 37.5–51.0)
Hemoglobin: 14.4 g/dL (ref 13.0–17.7)
Immature Grans (Abs): 0.1 10*3/uL (ref 0.0–0.1)
Immature Granulocytes: 1 %
Lymphocytes Absolute: 1.7 10*3/uL (ref 0.7–3.1)
Lymphs: 15 %
MCH: 27.6 pg (ref 26.6–33.0)
MCHC: 31.9 g/dL (ref 31.5–35.7)
MCV: 87 fL (ref 79–97)
Monocytes Absolute: 0.7 10*3/uL (ref 0.1–0.9)
Monocytes: 7 %
Neutrophils Absolute: 8.4 10*3/uL — ABNORMAL HIGH (ref 1.4–7.0)
Neutrophils: 75 %
Platelets: 405 10*3/uL (ref 150–450)
RBC: 5.22 x10E6/uL (ref 4.14–5.80)
RDW: 13.4 % (ref 11.6–15.4)
WBC: 10.9 10*3/uL — ABNORMAL HIGH (ref 3.4–10.8)

## 2022-08-09 LAB — LACTATE DEHYDROGENASE: LDH: 146 IU/L (ref 121–224)

## 2022-08-09 LAB — TSH: TSH: 1.65 u[IU]/mL (ref 0.450–4.500)

## 2022-08-09 LAB — SEDIMENTATION RATE: Sed Rate: 20 mm/h (ref 0–30)

## 2022-08-09 NOTE — Assessment & Plan Note (Signed)
Recurrent problem  Weight down 9% since mid-July this year, 175 pounds to 163 pounds.    A/ Onset of chest cold about a month ago with persistent productive cough since. 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 Concerning medications: Empagliflozin, opiates (takes about 2 per week), and meloxicam (takes once or twice a week)  Labs only significant for WBC slightly elevated 10.9 with mild neutrophilia and serum glucose 459  Differential diagnosis: Lung disorder cause Uncontrolled diabetes Dysphagia - esophageal cause Tubular adenoma history - needs follow up Medications (opiates, empagliflozin, meloxicam)  P/ Chest CT with contrast Needs HIV screen at next visit Improvement of glycemic control with insulin therapy changes GI consult follow up for tubular adenoma and esophageal dysphagia to solids  While medications may contribute, they would likely only be contributing rather than causal  RTC 2 weeks

## 2022-08-09 NOTE — Assessment & Plan Note (Addendum)
Established problem Uncontrolled.  Patient is not at goal of < 8.5%. RTC 2 week  Options Increase metformin Add short-acting prandial insulin Increase basal insulin Add GLP-1, but watch for weight loss.   Referral to pharmacy clinic, if patient willing

## 2022-08-09 NOTE — Telephone Encounter (Signed)
Patient left a message on referral line stating that original nasal spray that was ordered is not covered by his insurance and will $100 oop.  He is asking that a cheaper alternative be sent to the pharmacy.  Britaney Espaillat,CMA

## 2022-08-09 NOTE — Assessment & Plan Note (Signed)
New problem Differential diagnosis:  Lower respiratory inflammation post-viral cough syndrome Postnasal drip Bronchitis  GERD  P/ Chest CT with contrast Azelastine-fluticasone RTC 2 weeks

## 2022-08-09 NOTE — Telephone Encounter (Signed)
Azelastine - Fluticasone NS replaced by Azelastine 0.15% sln 2 spray each nostril bid

## 2022-08-10 ENCOUNTER — Other Ambulatory Visit: Payer: Self-pay | Admitting: Family Medicine

## 2022-08-10 DIAGNOSIS — R053 Chronic cough: Secondary | ICD-10-CM

## 2022-08-10 DIAGNOSIS — J309 Allergic rhinitis, unspecified: Secondary | ICD-10-CM

## 2022-08-14 ENCOUNTER — Ambulatory Visit (HOSPITAL_COMMUNITY)
Admission: RE | Admit: 2022-08-14 | Discharge: 2022-08-14 | Disposition: A | Discharge status: Home / Self Care | Payer: PPO | Source: Ambulatory Visit | Attending: Family Medicine | Admitting: Family Medicine

## 2022-08-14 DIAGNOSIS — R634 Abnormal weight loss: Secondary | ICD-10-CM | POA: Insufficient documentation

## 2022-08-14 DIAGNOSIS — R053 Chronic cough: Secondary | ICD-10-CM | POA: Diagnosis present

## 2022-08-14 MED ORDER — IOHEXOL 300 MG/ML  SOLN
75.0000 mL | Freq: Once | INTRAMUSCULAR | Status: AC | PRN
Start: 2022-08-14 — End: 2022-08-14
  Administered 2022-08-14: 75 mL via INTRAVENOUS

## 2022-08-16 ENCOUNTER — Telehealth: Payer: Self-pay | Admitting: Family Medicine

## 2022-08-16 DIAGNOSIS — K828 Other specified diseases of gallbladder: Secondary | ICD-10-CM

## 2022-08-16 NOTE — Telephone Encounter (Signed)
I spoke with Gregory Harrison about his concerning Chest CT findings of 9 mm spiculated nodule LUL and abnormality of gallbladder which could be stone or malignancy.  Plan Referral to [place] to evaluate lung nodule RUQ [place] to characterize the gallbladder better.

## 2022-08-16 NOTE — Telephone Encounter (Signed)
Patient calls nurse line stating he missed a call from our office.   I see Dr. McDiarmid has an open telephone note.   Will forward.

## 2022-08-19 ENCOUNTER — Other Ambulatory Visit: Payer: Self-pay | Admitting: Student

## 2022-08-19 ENCOUNTER — Telehealth: Payer: Self-pay | Admitting: Family Medicine

## 2022-08-19 ENCOUNTER — Encounter: Payer: Self-pay | Admitting: Family Medicine

## 2022-08-19 DIAGNOSIS — J309 Allergic rhinitis, unspecified: Secondary | ICD-10-CM

## 2022-08-19 DIAGNOSIS — J438 Other emphysema: Secondary | ICD-10-CM | POA: Insufficient documentation

## 2022-08-19 DIAGNOSIS — R911 Solitary pulmonary nodule: Secondary | ICD-10-CM

## 2022-08-19 DIAGNOSIS — R053 Chronic cough: Secondary | ICD-10-CM

## 2022-08-19 DIAGNOSIS — I700 Atherosclerosis of aorta: Secondary | ICD-10-CM | POA: Insufficient documentation

## 2022-08-19 HISTORY — DX: Solitary pulmonary nodule: R91.1

## 2022-08-19 NOTE — Telephone Encounter (Signed)
I left message that Nurse Navigator from Dr Worthy Flank office would be in contact with him about his CT finding.

## 2022-08-21 ENCOUNTER — Ambulatory Visit
Admission: RE | Admit: 2022-08-21 | Discharge: 2022-08-21 | Disposition: A | Discharge status: Home / Self Care | Payer: PPO | Source: Ambulatory Visit | Attending: Family Medicine | Admitting: Family Medicine

## 2022-08-21 DIAGNOSIS — K828 Other specified diseases of gallbladder: Secondary | ICD-10-CM

## 2022-08-22 ENCOUNTER — Telehealth: Payer: Self-pay

## 2022-08-22 NOTE — Telephone Encounter (Signed)
Received call from [facility] regarding results of ultrasound.   See below.   IMPRESSION: Soft tissue mass involving the gallbladder fundus. Possibility of gallbladder malignancy not excluded. Recommend surgical consultation.  Will forward to ordering provider (McDiarmid)  Talbot Grumbling, RN

## 2022-08-23 ENCOUNTER — Telehealth: Payer: Self-pay | Admitting: Physician Assistant

## 2022-08-23 NOTE — Telephone Encounter (Signed)
Scheduled appt per 9/15 staff msg. Pt is aware of appt date and time. Pt is aware to arrive 15 mins prior to appt time and to bring and updated insurance card. Pt is aware of appt location.

## 2022-08-26 ENCOUNTER — Telehealth: Payer: Self-pay | Admitting: Family Medicine

## 2022-08-26 DIAGNOSIS — K828 Other specified diseases of gallbladder: Secondary | ICD-10-CM

## 2022-08-26 NOTE — Telephone Encounter (Signed)
Patient returned call.  Henretter Piekarski,CMA

## 2022-08-26 NOTE — Telephone Encounter (Signed)
Phone call return.  See my telephone note from today for brief description.

## 2022-08-26 NOTE — Telephone Encounter (Signed)
I discussed with Gregory Harrison on 9/18 the concerning gallbladder finding.  He is agreeable to consultation to general surgery for evaluation. GS referral made.

## 2022-08-26 NOTE — Telephone Encounter (Signed)
I spoke with Gregory Harrison about the mass of uncertain nature seen in his gallbladder on RUQ [place].  He is willing to consult with general surgery for evaluation and recommendations about this [place] finding.  Referral order for general surgery consultation made.   Gregory Harrison also told me that he has an appointment in two days at [facility] for initial discussions about his solitary pulmonary nodule with concerning features.

## 2022-08-27 NOTE — Progress Notes (Signed)
[place] [phone]   [phone]  INITIAL CONSULTATION:  Patient Care Team: Orvis Brill, DO as PCP - General (Family Medicine)  CHIEF COMPLAINTS/PURPOSE OF CONSULTATION:  Gallbladder mass  HISTORY OF PRESENTING ILLNESS:  Gregory Harrison 71 y.o. male with medical history significant for arthritis, diabetes, GERD, hypertension and hyperlipidemia presents to the diagnostic clinic for evaluation for gallbladder mass.  He is accompanied by his wife for this visit.  On review of the previous records, Gregory Harrison presented with unintentional weight loss, fatigue and cough. He underwent CT chest on 08/14/2022 that showed 9 mm LUL nodule concerning for small bronchogenic carcinoma and thickened appearance of gallbladder fundus concerning for underlying malignancy. US abdomen was obtained on 08/21/2022 that showed soft tissue mass involving the gallbladder fundus.   On exam today, Gregory Harrison reports that initially he lost nearly 20 pounds over 2 months which prompted the diagnostic work-up.  However in the last 3 weeks, he has gained nearly 10 pounds.  His energy levels are back to baseline and he is able to complete all his daily activities on his own.  He denies any nausea, vomiting or abdominal pain.  His bowel habits are unchanged without any recurrent episodes of diarrhea or constipation.  He denies easy bruising or signs of active bleeding.  He denies fevers, chills, night sweats, shortness of breath, chest pain or cough.  He has no other complaints.  Rest of the 10 point ROS is below.  MEDICAL HISTORY:  Past Medical History:  Diagnosis Date   Arthritis    2010   Diabetes ([place]) 2000   Diabetic neuropathy ([place])    GERD (gastroesophageal reflux disease)    HTN (hypertension) 2010   Hypercholesterolemia    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 23, blurry vision   Weight loss 07/16/2019    SURGICAL HISTORY: Past Surgical  History:  Procedure Laterality Date   EYE SURGERY Left 1990s   " to peel off a growth"   HAND SURGERY  1980s   R hand pointer finger    NOSE SURGERY  1979   after broke his nose    SOCIAL HISTORY: Social History   Socioeconomic History   Marital status: Married    Spouse name: Jarret Torre   Number of children: 2   Years of education: 12   Highest education level: High school graduate  Occupational History   Occupation: retired  Tobacco Use   Smoking status: Former    Packs/day: 2.00    Years: 20.00    Total pack years: 40.00    Types: Cigarettes    Quit date: 1989    Years since quitting: 34.7   Smokeless tobacco: Never  Vaping Use   Vaping Use: Never used  Substance and Sexual Activity   Alcohol use: Not Currently   Drug use: Not Currently    Types: Marijuana   Sexual activity: Yes    Partners: Female  Other Topics Concern   Not on file  Social History Narrative   Patient lives with his wife, daughter and her 2 children.    Patient enjoys hunting and fishing and being outside.    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    Patient has 2 dogs and 1 cat.    Social Determinants of Health   Financial Resource Strain: Low Risk  (01/07/2020)   Overall Financial Resource Strain (CARDIA)  Difficulty of Paying Living Expenses: Not very hard  Food Insecurity: No Food Insecurity (01/07/2020)   Hunger Vital Sign    Worried About Running Out of Food in the Last Year: Never true    Ran Out of Food in the Last Year: Never true  Transportation Needs: No Transportation Needs (01/07/2020)   PRAPARE - Hydrologist (Medical): No    Lack of Transportation (Non-Medical): No  Physical Activity: Sufficiently Active (01/07/2020)   Exercise Vital Sign    Days of Exercise per Week: 7 days    Minutes of Exercise per Session: 60 min  Stress: No Stress Concern Present (01/07/2020)   [place]    Feeling of Stress : Not at all  Social Connections: Moderately Isolated (01/07/2020)   Social Connection and Isolation Panel [NHANES]    Frequency of Communication with Friends and Family: More than three times a week    Frequency of Social Gatherings with Friends and Family: More than three times a week    Attends Religious Services: Never    Marine scientist or Organizations: No    Attends Archivist Meetings: Never    Marital Status: Married  Human resources officer Violence: Not At Risk (01/07/2020)   Humiliation, Afraid, Rape, and Kick questionnaire    Fear of Current or Ex-Partner: No    Emotionally Abused: No    Physically Abused: No    Sexually Abused: No    FAMILY HISTORY: Family History  Problem Relation Age of Onset   Heart attack Father        died in 74, had open surgery   Heart murmur Father    Other Mother        bladder taken out for unknown reason. unspecified brain tumor   ALS Brother    Diabetes Paternal Grandfather    Colon cancer Neg Hx    Colon polyps Neg Hx    Esophageal cancer Neg Hx    Rectal cancer Neg Hx    Stomach cancer Neg Hx     ALLERGIES:  has No Known Allergies.  MEDICATIONS:  Current Outpatient Medications  Medication Sig Dispense Refill   Alcohol Swabs PADS Use with insulin 100 each 3   amLODipine (NORVASC) 2.5 MG tablet Take 1 tablet (2.5 mg total) by mouth at bedtime. 90 tablet 3   atorvastatin (LIPITOR) 40 MG tablet TAKE 1 TABLET BY MOUTH EVERY DAY 90 tablet 0   Azelastine HCl 0.15 % SOLN PLACE 2 SPRAYS INTO THE NOSE 2 (TWO) TIMES DAILY. 11 mL 1   blood glucose meter kit and supplies Dispense based on patient and insurance preference. Use up to four times daily as directed. (FOR ICD-10 E10.9, E11.9). 1 each 0   Blood Glucose Monitoring Suppl (ONETOUCH VERIO REFLECT) w/Device KIT CHECK BLOOD GLUCOSE TWICE DAILY 1 kit 0   Blood Glucose Monitoring Suppl (ONETOUCH VERIO)  w/Device KIT 1 each by Does not apply route as directed. 1 kit 0   empagliflozin (JARDIANCE) 25 MG TABS tablet Take 25 mg by mouth daily. 90 tablet 3   gabapentin (NEURONTIN) 400 MG capsule Take 3 capsules (1,200 mg total) by mouth at bedtime. 270 capsule 3   hydrochlorothiazide (HYDRODIURIL) 12.5 MG tablet TAKE 1 TABLET BY MOUTH EVERY DAY 90 tablet 3   HYDROcodone-acetaminophen (NORCO/VICODIN) 5-325 MG tablet Take 1 tablet by mouth 2 (two) times daily as needed for moderate pain. For chronic pain syndrome  60 tablet 0   [START ON 09/03/2022] HYDROcodone-acetaminophen (NORCO/VICODIN) 5-325 MG tablet Take 1 tablet by mouth 2 (two) times daily as needed for moderate pain. For chronic pain syndrome 60 tablet 0   Insulin Glargine (BASAGLAR KWIKPEN) 100 UNIT/ML Inject 34 Units into the skin daily.     Insulin Pen Needle 31G X 8 MM MISC Check blood sugars twice a day 1000 each 0   Lancet Devices (ONE TOUCH DELICA LANCING DEV) MISC 1 each by Does not apply route as directed. 1 each 0   losartan (COZAAR) 25 MG tablet TAKE 1 TABLET BY MOUTH EVERY DAY 90 tablet 3   meloxicam (MOBIC) 7.5 MG tablet TAKE 1 TABLET BY MOUTH EVERY DAY AS NEEDED FOR PAIN 30 tablet 0   metFORMIN (GLUCOPHAGE-XR) 500 MG 24 hr tablet TAKE 2 TABLETS BY MOUTH TWICE A DAY 360 tablet 3   ONETOUCH DELICA LANCETS FINE MISC 1 each by Does not apply route 3 (three) times daily. 100 each 3   ONETOUCH VERIO test strip USE AS INSTRUCTED 100 strip 12   No current facility-administered medications for this visit.    REVIEW OF SYSTEMS:   Constitutional: ( - ) fevers, ( - )  chills , ( - ) night sweats Eyes: ( - ) blurriness of vision, ( - ) double vision, ( - ) watery eyes Ears, nose, mouth, throat, and face: ( - ) mucositis, ( - ) sore throat Respiratory: ( - ) cough, ( - ) dyspnea, ( - ) wheezes Cardiovascular: ( - ) palpitation, ( - ) chest discomfort, ( - ) lower extremity swelling Gastrointestinal:  ( - ) nausea, ( - ) heartburn, ( - )  change in bowel habits Skin: ( - ) abnormal skin rashes Lymphatics: ( - ) new lymphadenopathy, ( - ) easy bruising Neurological: ( - ) numbness, ( - ) tingling, ( - ) new weaknesses Behavioral/Psych: ( - ) mood change, ( - ) new changes  All other systems were reviewed with the patient and are negative.  PHYSICAL EXAMINATION: ECOG PERFORMANCE STATUS: 1 - Symptomatic but completely ambulatory  Vitals:   08/28/22 0900 08/28/22 0910  BP: (!) 177/64 (!) 158/80  Pulse: (!) 57   Temp: 97.7 F (36.5 C)   SpO2: 99%    Filed Weights   08/28/22 0900  Weight: 172 lb 3.2 oz (78.1 kg)    GENERAL: well appearing male in NAD  SKIN: skin color, texture, turgor are normal, no rashes or significant lesions EYES: conjunctiva are pink and non-injected, sclera clear OROPHARYNX: no exudate, no erythema; lips, buccal mucosa, and tongue normal  NECK: supple, non-tender LYMPH:  no palpable lymphadenopathy in the cervical, axillary or supraclavicular lymph nodes.  LUNGS: clear to auscultation and percussion with normal breathing effort HEART: regular rate & rhythm and no murmurs and no lower extremity edema ABDOMEN: soft, non-tender, non-distended, normal bowel sounds Musculoskeletal: no cyanosis of digits and no clubbing  PSYCH: alert & oriented x 3, fluent speech NEURO: no focal motor/sensory deficits  LABORATORY DATA:  I have reviewed the data as listed    Latest Ref Rng & Units 08/28/2022   10:16 AM 08/08/2022   11:27 AM 07/15/2019   10:28 AM  CBC  WBC 4.0 - 10.5 K/uL 6.3  10.9  7.8   Hemoglobin 13.0 - 17.0 g/dL 13.0  14.4  14.4   Hematocrit 39.0 - 52.0 % 39.2  45.2  44.1   Platelets 150 - 400 K/uL 165  405  217        Latest Ref Rng & Units 08/28/2022   10:16 AM 08/08/2022   11:27 AM 03/11/2022    3:20 PM  CMP  Glucose 70 - 99 mg/dL 189  459  141   BUN 8 - 23 mg/dL _0 Creatinine 0.61 - 1.24 mg/dL 0.85  1.11  1.09   Sodium 135 - 145 mmol/L 139  136  144   Potassium 3.5 - 5.1  mmol/L 4.3  4.8  4.5   Chloride 98 - 111 mmol/L 106  97  106   CO2 22 - 32 mmol/L _1 Calcium 8.9 - 10.3 mg/dL 9.1  9.9  9.3   Total Protein 6.5 - 8.1 g/dL 6.5  6.7    Total Bilirubin 0.3 - 1.2 mg/dL 0.5  0.5    Alkaline Phos 38 - 126 U/L 47  69    AST 15 - 41 U/L 16  17    ALT 0 - 44 U/L 21  31       RADIOGRAPHIC STUDIES: I have personally reviewed the radiological images as listed and agreed with the findings in the report. US Abdomen Limited RUQ (LIVER/GB)  Result Date: 08/22/2022 CLINICAL DATA:  Fundal thickening on chest CT. EXAM: ULTRASOUND ABDOMEN LIMITED RIGHT UPPER QUADRANT COMPARISON:  Chest CT August 14, 2022 FINDINGS: Gallbladder: Within the fundus of the gallbladder there is a 3.8 x 3.5 x 4.2 cm soft tissue mass. Additionally there is a large stone within the gallbladder lumen. No pericholecystic fluid. Negative sonographic Murphy's sign. Common bile duct: Diameter: 5.8 mm Liver: No focal lesion identified. Within normal limits in parenchymal echogenicity. Portal vein is patent on color Doppler imaging with normal direction of blood flow towards the liver. Other: Too small to characterize 8 mm hypoechoic lesion partially exophytic superior pole right kidney. IMPRESSION: Soft tissue mass involving the gallbladder fundus. Possibility of gallbladder malignancy not excluded. Recommend surgical consultation. These results will be called to the ordering clinician or representative by the Radiologist Assistant, and communication documented in the PACS or [employer]. Electronically Signed   By: Lovey Newcomer M.D.   On: 08/22/2022 07:56   CT Chest W Contrast  Result Date: 08/15/2022 CLINICAL DATA:  Unintentional weight loss.  Cough. EXAM: CT CHEST WITH CONTRAST TECHNIQUE: Multidetector CT imaging of the chest was performed during intravenous contrast administration. RADIATION DOSE REDUCTION: This exam was performed according to the departmental dose-optimization program which  includes automated exposure control, adjustment of the mA and/or kV according to patient size and/or use of iterative reconstruction technique. CONTRAST:  29m OMNIPAQUE IOHEXOL 300 MG/ML  SOLN COMPARISON:  Chest CT dated 08/12/2007. FINDINGS: Cardiovascular: There is no cardiomegaly or pericardial effusion. There is coronary vascular calcification. Moderate atherosclerotic calcification of the thoracic aorta. No aneurysmal dilatation or dissection. The origins of the great vessels of the aortic arch are patent. No pulmonary artery embolus identified. Mediastinum/Nodes: No hilar or mediastinal adenopathy. The esophagus and the thyroid gland are grossly unremarkable. No mediastinal fluid collection. Lungs/Pleura: Mild paraseptal emphysema. There is a 9 mm left upper lobe nodule with somewhat spiculated margins concerning for a small bronchogenic carcinoma. Clinical correlation and multidisciplinary consult is advised. No consolidative changes. There is no pleural effusion pneumothorax. Bibasilar subpleural reticular coarsening and scarring. The central airways are patent. Upper Abdomen: Subcentimeter enhancing focus in the dome of the liver (143/8) is not characterized, likely a flash filling hemangioma or  a portal venous shunting. There is thickened appearance of the gallbladder fundus measuring up to 2 cm in thickness. There is probable a rim calcified stone within the gallbladder. Findings concerning for underlying gallbladder malignancy. Further evaluation with ultrasound or MRI is recommended. Musculoskeletal: There is degenerative changes of the spine and scoliosis. No acute osseous pathology. IMPRESSION: 1. No acute intrathoracic pathology. No CT evidence of pulmonary artery embolus. 2. A 9 mm left upper lobe nodule with somewhat spiculated margins concerning for a small bronchogenic carcinoma. Multidisciplinary consult is advised. 3. Thickened appearance of the gallbladder fundus concerning for underlying  gallbladder malignancy. Further evaluation with ultrasound or MRI is recommended. 4. Aortic Atherosclerosis (ICD10-I70.0) and Emphysema (ICD10-J43.9). Electronically Signed   By: Anner Crete M.D.   On: 08/15/2022 23:30   [place] LIMITED JOINT SPACE STRUCTURES UP RIGHT  Result Date: 07/30/2022 Procedure:  Injection of right subacromial space under ultrasound guidance. Ultrasound guidance utilized for in-place approach, echogenicity of supraspinatus tendon consistent with tendinopathy Samsung HS60 device utilized with permanent recording / reporting. Verbal informed consent obtained and verified. Skin prepped in a sterile fashion. Ethyl chloride for topical local analgesia. Completed without difficulty and tolerated well. Medication: triamcinolone acetonide 40 mg/mL suspension for injection 1 mL total and 2 mL lidocaine 1% without epinephrine utilized for needle placement anesthetic Advised to contact for fevers/chills, erythema, induration, drainage, or persistent bleeding. Procedure:  Injection of right lateral epicondyle under ultrasound guidance. Ultrasound guidance utilized for out-of-plane approach, no effusion Samsung HS60 device utilized with permanent recording / reporting. Verbal informed consent obtained and verified. Skin prepped in a sterile fashion. Ethyl chloride for topical local analgesia. Completed without difficulty and tolerated well. Medication: triamcinolone acetonide 40 mg/mL suspension for injection 1 mL total and 2 mL lidocaine 1% without epinephrine utilized for needle placement anesthetic Advised to contact for fevers/chills, erythema, induration, drainage, or persistent bleeding.    ASSESSMENT & PLAN Gregory Harrison is a 71 y.o. male who presents to the diagnostic clinic for recent CT chest and RUQ ultrasound that is concerning for a gallbladder mass.   We reviewed recommended diagnostic work-up which includes for evaluation today to check CBC, CMP, CEA and CA 19-9 tumor markers.  Additionally, CT chest identified 9 mm left upper lobe nodule with spiculated margins concerning for underlying malignancy.  We will obtain a PET/T scan to evaluate for metastatic disease.  A referral was made to [facility] surgery for surgical consultation.  We will plan to discuss patient's case at multidisciplinary tumor board once work-up is complete.  #Gallbladder mass/LUL nodule: --Labs today to check CBC, CMP, CEA and CA19-9 levels --Need PET scan to evaluate for metastatic disease, scheduled for 09/05/2022 --Referral to [facility] placed by PCP, awaiting consultation --RTC once workup is complete   Orders Placed This Encounter  Procedures   NM PET Image Initial (PI) Skull Base To Thigh    Standing Status:   Future    Standing Expiration Date:   08/28/2023    Order Specific Question:   If indicated for the ordered procedure, I authorize the administration of a radiopharmaceutical per Radiology protocol    Answer:   Yes    Order Specific Question:   Preferred imaging location?    Answer:   [place]   CBC with Differential (Cancer Center Only)    Standing Status:   Future    Number of Occurrences:   1    Standing Expiration Date:   08/29/2023   CMP (Cancer  Center only)    Standing Status:   Future    Number of Occurrences:   1    Standing Expiration Date:   08/29/2023   CEA (Access)-[facility] ONLY    Standing Status:   Future    Number of Occurrences:   1    Standing Expiration Date:   08/29/2023   CA 19.9    Standing Status:   Future    Number of Occurrences:   1    Standing Expiration Date:   08/28/2023    All questions were answered. The patient knows to call the clinic with any problems, questions or concerns.  I have spent a total of 60 minutes minutes of face-to-face and non-face-to-face time, preparing to see the patient, obtaining and/or reviewing separately obtained history, performing a medically appropriate examination, counseling and educating the  patient, ordering tests/procedures, referring and communicating with other health care professionals, documenting clinical information in the electronic health record,and care coordination.   Dede Query, PA-C Department of Hematology/Oncology [place] at [facility] Phone: [phone]  Patient was seen with Dr. Lorenso Courier  I have read the above note and personally examined the patient. I agree with the assessment and plan as noted above.  Briefly Mr. Matheney is a 71 year old male who presents for evaluation of gallbladder mass and a left upper lobe pulmonary nodule.  At this time would recommend PET CT scan in order to evaluate the FDG avidity of that left upper lobe pulmonary nodule.  Additionally would like the patient to connect with surgery service for consideration of a cholecystectomy in order to further evaluate this mass.  The patient and his daughter voiced understanding of the plan moving forward.   Ledell Peoples, MD Department of Hematology/Oncology [place] at [facility] Phone: [phone] Pager: [phone] Email: [email or web address]

## 2022-08-28 ENCOUNTER — Inpatient Hospital Stay: Payer: PPO | Attending: Physician Assistant | Admitting: Physician Assistant

## 2022-08-28 ENCOUNTER — Other Ambulatory Visit: Payer: Self-pay

## 2022-08-28 ENCOUNTER — Encounter: Payer: Self-pay | Admitting: Physician Assistant

## 2022-08-28 ENCOUNTER — Inpatient Hospital Stay: Payer: PPO

## 2022-08-28 VITALS — BP 158/80 | HR 57 | Temp 97.7°F | Wt 172.2 lb

## 2022-08-28 DIAGNOSIS — F1290 Cannabis use, unspecified, uncomplicated: Secondary | ICD-10-CM | POA: Diagnosis not present

## 2022-08-28 DIAGNOSIS — R933 Abnormal findings on diagnostic imaging of other parts of digestive tract: Secondary | ICD-10-CM | POA: Diagnosis not present

## 2022-08-28 DIAGNOSIS — Z794 Long term (current) use of insulin: Secondary | ICD-10-CM | POA: Insufficient documentation

## 2022-08-28 DIAGNOSIS — I10 Essential (primary) hypertension: Secondary | ICD-10-CM | POA: Insufficient documentation

## 2022-08-28 DIAGNOSIS — M1990 Unspecified osteoarthritis, unspecified site: Secondary | ICD-10-CM | POA: Insufficient documentation

## 2022-08-28 DIAGNOSIS — E1140 Type 2 diabetes mellitus with diabetic neuropathy, unspecified: Secondary | ICD-10-CM | POA: Diagnosis not present

## 2022-08-28 DIAGNOSIS — K828 Other specified diseases of gallbladder: Secondary | ICD-10-CM

## 2022-08-28 DIAGNOSIS — K219 Gastro-esophageal reflux disease without esophagitis: Secondary | ICD-10-CM | POA: Insufficient documentation

## 2022-08-28 DIAGNOSIS — Z79899 Other long term (current) drug therapy: Secondary | ICD-10-CM | POA: Insufficient documentation

## 2022-08-28 DIAGNOSIS — R911 Solitary pulmonary nodule: Secondary | ICD-10-CM | POA: Diagnosis not present

## 2022-08-28 DIAGNOSIS — Z7984 Long term (current) use of oral hypoglycemic drugs: Secondary | ICD-10-CM | POA: Diagnosis not present

## 2022-08-28 LAB — CEA (ACCESS): CEA (CHCC): 2.25 ng/mL (ref 0.00–5.00)

## 2022-08-28 LAB — CBC WITH DIFFERENTIAL (CANCER CENTER ONLY)
Abs Immature Granulocytes: 0.03 10*3/uL (ref 0.00–0.07)
Basophils Absolute: 0 10*3/uL (ref 0.0–0.1)
Basophils Relative: 0 %
Eosinophils Absolute: 0.2 10*3/uL (ref 0.0–0.5)
Eosinophils Relative: 3 %
HCT: 39.2 % (ref 39.0–52.0)
Hemoglobin: 13 g/dL (ref 13.0–17.0)
Immature Granulocytes: 1 %
Lymphocytes Relative: 29 %
Lymphs Abs: 1.8 10*3/uL (ref 0.7–4.0)
MCH: 28.6 pg (ref 26.0–34.0)
MCHC: 33.2 g/dL (ref 30.0–36.0)
MCV: 86.3 fL (ref 80.0–100.0)
Monocytes Absolute: 0.4 10*3/uL (ref 0.1–1.0)
Monocytes Relative: 7 %
Neutro Abs: 3.8 10*3/uL (ref 1.7–7.7)
Neutrophils Relative %: 60 %
Platelet Count: 165 10*3/uL (ref 150–400)
RBC: 4.54 MIL/uL (ref 4.22–5.81)
RDW: 14.8 % (ref 11.5–15.5)
WBC Count: 6.3 10*3/uL (ref 4.0–10.5)
nRBC: 0 % (ref 0.0–0.2)

## 2022-08-28 LAB — CMP (CANCER CENTER ONLY)
ALT: 21 U/L (ref 0–44)
AST: 16 U/L (ref 15–41)
Albumin: 4 g/dL (ref 3.5–5.0)
Alkaline Phosphatase: 47 U/L (ref 38–126)
Anion gap: 5 (ref 5–15)
BUN: 17 mg/dL (ref 8–23)
CO2: 28 mmol/L (ref 22–32)
Calcium: 9.1 mg/dL (ref 8.9–10.3)
Chloride: 106 mmol/L (ref 98–111)
Creatinine: 0.85 mg/dL (ref 0.61–1.24)
GFR, Estimated: >60 mL/min (ref >60)
Glucose, Bld: 189 mg/dL — ABNORMAL HIGH (ref 70–99)
Potassium: 4.3 mmol/L (ref 3.5–5.1)
Sodium: 139 mmol/L (ref 135–145)
Total Bilirubin: 0.5 mg/dL (ref 0.3–1.2)
Total Protein: 6.5 g/dL (ref 6.5–8.1)

## 2022-08-28 NOTE — Progress Notes (Signed)
I met with Gregory Harrison and his wife after  his consultation with Dede Query, Pa-C and Dr Loma Messing.  I explained my role as a nurse navigator and provided my contact information. I explained the services provided at [facility] and provided written information.  All questions were answered. He verbalized understanding.

## 2022-08-29 NOTE — Progress Notes (Signed)
I spoke with Baxter Flattery in PET scan.  The only sooner appt for PET is in the afternoon.  The patient is insulin dependent diabetic and Baxter Flattery recommends keeping the 0700 9/28 appt because of this.

## 2022-08-29 NOTE — Addendum Note (Signed)
Addended by: Ihor Gully A on: 08/29/2022 02:50 PM   Modules accepted: Orders

## 2022-08-30 LAB — CANCER ANTIGEN 19-9: CA 19-9: 23 U/mL (ref 0–35)

## 2022-09-02 ENCOUNTER — Ambulatory Visit: Payer: Self-pay | Admitting: Surgery

## 2022-09-02 ENCOUNTER — Other Ambulatory Visit: Payer: Self-pay | Admitting: Surgery

## 2022-09-02 DIAGNOSIS — K828 Other specified diseases of gallbladder: Secondary | ICD-10-CM

## 2022-09-02 DIAGNOSIS — E1169 Type 2 diabetes mellitus with other specified complication: Secondary | ICD-10-CM | POA: Insufficient documentation

## 2022-09-02 DIAGNOSIS — E1140 Type 2 diabetes mellitus with diabetic neuropathy, unspecified: Secondary | ICD-10-CM | POA: Insufficient documentation

## 2022-09-05 ENCOUNTER — Encounter (HOSPITAL_COMMUNITY)
Admission: RE | Admit: 2022-09-05 | Discharge: 2022-09-05 | Disposition: A | Discharge status: Home / Self Care | Payer: PPO | Source: Ambulatory Visit | Attending: Physician Assistant | Admitting: Physician Assistant

## 2022-09-05 DIAGNOSIS — J432 Centrilobular emphysema: Secondary | ICD-10-CM | POA: Insufficient documentation

## 2022-09-05 DIAGNOSIS — N200 Calculus of kidney: Secondary | ICD-10-CM | POA: Diagnosis not present

## 2022-09-05 DIAGNOSIS — R911 Solitary pulmonary nodule: Secondary | ICD-10-CM | POA: Insufficient documentation

## 2022-09-05 DIAGNOSIS — R918 Other nonspecific abnormal finding of lung field: Secondary | ICD-10-CM | POA: Insufficient documentation

## 2022-09-05 DIAGNOSIS — K828 Other specified diseases of gallbladder: Secondary | ICD-10-CM | POA: Diagnosis present

## 2022-09-05 DIAGNOSIS — R590 Localized enlarged lymph nodes: Secondary | ICD-10-CM | POA: Diagnosis not present

## 2022-09-05 DIAGNOSIS — I700 Atherosclerosis of aorta: Secondary | ICD-10-CM | POA: Insufficient documentation

## 2022-09-05 LAB — GLUCOSE, CAPILLARY: Glucose-Capillary: 111 mg/dL — ABNORMAL HIGH (ref 70–99)

## 2022-09-05 MED ORDER — FLUDEOXYGLUCOSE F - 18 (FDG) INJECTION
8.6000 | Freq: Once | INTRAVENOUS | Status: AC
  Administered 2022-09-05: 8.6 via INTRAVENOUS

## 2022-09-10 ENCOUNTER — Telehealth: Payer: Self-pay

## 2022-09-10 NOTE — Telephone Encounter (Signed)
T/C from pt stating he became very ill on Sunday with nausea, vomiting and chills. He did not go to the ED.  He called to ask about when he might have his gallbladder surgery.  Pt was referred to his surgeon at [facility] for questions and concerns

## 2022-09-11 ENCOUNTER — Other Ambulatory Visit: Payer: Self-pay | Admitting: Physician Assistant

## 2022-09-11 MED ORDER — ONDANSETRON 8 MG PO TBDP: 8.0000 mg | ORAL_TABLET | Freq: Three times a day (TID) | ORAL | 0 refills | Status: DC | PRN

## 2022-09-12 ENCOUNTER — Encounter: Payer: PPO | Admitting: Student in an Organized Health Care Education/Training Program

## 2022-09-13 ENCOUNTER — Telehealth: Payer: Self-pay

## 2022-09-13 ENCOUNTER — Other Ambulatory Visit: Payer: Self-pay | Admitting: Family Medicine

## 2022-09-13 ENCOUNTER — Other Ambulatory Visit: Payer: Self-pay

## 2022-09-13 DIAGNOSIS — E1142 Type 2 diabetes mellitus with diabetic polyneuropathy: Secondary | ICD-10-CM

## 2022-09-13 MED ORDER — ONDANSETRON 8 MG PO TBDP: 8.0000 mg | ORAL_TABLET | Freq: Three times a day (TID) | ORAL | 0 refills | Status: DC | PRN

## 2022-09-13 NOTE — Telephone Encounter (Signed)
T/C from pt stating his rx for Zofran ODT is going to cost him $300 dollars and he would need a rx for a generic.  I called [facility] to verify rx and was told they did not even have this in stock.  Pt advised and [facility].  [facility] and pt advised.

## 2022-09-14 ENCOUNTER — Ambulatory Visit
Admission: RE | Admit: 2022-09-14 | Discharge: 2022-09-14 | Disposition: A | Discharge status: Home / Self Care | Payer: PPO | Source: Ambulatory Visit | Attending: Surgery | Admitting: Surgery

## 2022-09-14 DIAGNOSIS — K828 Other specified diseases of gallbladder: Secondary | ICD-10-CM

## 2022-09-14 MED ORDER — GADOBENATE DIMEGLUMINE 529 MG/ML IV SOLN
15.0000 mL | Freq: Once | INTRAVENOUS | Status: AC | PRN
  Administered 2022-09-14: 15 mL via INTRAVENOUS

## 2022-09-15 ENCOUNTER — Other Ambulatory Visit: Payer: Self-pay | Admitting: Physician Assistant

## 2022-09-16 NOTE — Telephone Encounter (Signed)
Received call from [facility], a pharmacist @ [facility] asking about the order for Zofran. She is asking if there are plans for chemo/radiation for this pt at this time. Advised that there are no plans in place at this time. She states usually [facility] will approve Zofran when a pt is undergoing chemo or radiation.  She is asking if we want to cancel the prescription or ask for appeal with medical director. Advised to try the appeal for now. She voiced understanding.

## 2022-09-17 ENCOUNTER — Ambulatory Visit: Payer: Self-pay | Admitting: Surgery

## 2022-09-17 NOTE — H&P (Signed)
History of Present Illness: Gregory Harrison is a 71 y.o. male who is seen today for evaluation for possible gallbladder cancer. He was first seen by his PCP for evaluation of unexplained weight loss and productive cough. He underwent a CT of the chest for workup, which showed a 60m LUL nodule and incidentally thickening of the gallbladder fundus on the lower cuts of the CT. He then had an UKoreathat showed an apparent soft tissue mass in the fundus of the gallbladder. He was referred to Dr. TGeorgette Doverand then sent for an MRCP to further evaluate this. He also had a PET on 9/28 for workup of the lung nodule, which did not show any uptake in either the lung or the gallbladder. CA19-9 on 9/20 was normal at 23. LFTs were also normal.   Yesterday he had his MRCP, which showed a large gallstone and hyperintense material in the fundus of the gallbladder with was most consistent with impacted sludge. No masses were noted. Today, he reports that last weekend he had several hours of chills, nausea and vomiting, with some pain across his abdomen, but his primary symptom was vomiting. He otherwise denies postprandial epigastric or RUQ abdominal pain.   He has never had any previous abdominal surgeries.    Review of Systems: A complete review of systems was obtained from the patient.  I have reviewed this information and discussed as appropriate with the patient.  See HPI as well for other ROS.     Medical History: Past Medical History Past Medical History: Diagnosis Date  Diabetes mellitus without complication (CMS-HCC)    GERD (gastroesophageal reflux disease)    Hyperlipidemia    Hypertension        Patient Active Problem List Diagnosis  Atherosclerosis of aorta (CMS-HCC)  Bilateral primary osteoarthritis of knee  Chronic left shoulder pain  Diabetic neuropathy (CMS-HCC)  Hyperlipidemia associated with type 2 diabetes mellitus   Hypertension associated with diabetes      Past Surgical History Past  Surgical History: Procedure Laterality Date  hand surgery      nose surgery          AllergiesExpand by Default No Known Allergies    Current Outpatient Medications on File Prior to Visit Medication Sig Dispense Refill  amLODIPine (NORVASC) 2.5 MG tablet Take by mouth      atorvastatin (LIPITOR) 40 MG tablet Take 1 tablet by mouth once daily      gabapentin (NEURONTIN) 400 MG capsule Take by mouth      hydroCHLOROthiazide (HYDRODIURIL) 12.5 MG tablet Take 1 tablet by mouth once daily      HYDROcodone-acetaminophen (NORCO) 5-325 mg tablet Take by mouth      insulin GLARGINE (BASAGLAR KWIKPEN U-100 INSULIN) pen injector (concentration 100 units/mL) Inject subcutaneously      losartan (COZAAR) 25 MG tablet Take 1 tablet by mouth once daily      meloxicam (MOBIC) 7.5 MG tablet Take 1 tablet by mouth once daily as needed      metFORMIN (GLUCOPHAGE-XR) 500 MG XR tablet Take 2 tablets by mouth 2 (two) times daily       No current facility-administered medications on file prior to visit.     Family History History reviewed. No pertinent family history.     Social History   Tobacco Use Smoking Status Former  Types: Cigarettes Smokeless Tobacco Never     Social History Social History    Socioeconomic History  Marital status: Married Tobacco Use  Smoking status: Former  Types: Cigarettes  Smokeless tobacco: Never Vaping Use  Vaping Use: Never used Substance and Sexual Activity  Alcohol use: Not Currently  Drug use: Never      Objective:     Vitals:   09/17/22 0940 BP: 132/78 Pulse: 68 Temp: 36.6 C (97.9 F) SpO2: 96% Weight: 75.3 kg (166 lb) Height: 188 cm ('6\' 2"'$ )   Body mass index is 21.31 kg/m.   Physical Exam Constitutional:      General: He is not in acute distress.    Appearance: Normal appearance.  HENT:     Head: Normocephalic and atraumatic.  Eyes:     General: No scleral icterus.    Conjunctiva/sclera: Conjunctivae normal.  Pulmonary:      Effort: Pulmonary effort is normal. No respiratory distress.  Abdominal:     General: There is no distension.     Palpations: Abdomen is soft. There is no mass.     Tenderness: There is no abdominal tenderness.     Comments: No surgical scars.  Musculoskeletal:        General: Normal range of motion.  Skin:    General: Skin is warm and dry.     Coloration: Skin is not jaundiced.  Neurological:     General: No focal deficit present.     Mental Status: He is oriented to person, place, and time.          Labs, Imaging and Diagnostic Testing: MRCP 09/14/22: IMPRESSION: 1. Large gallstone in the proximal gallbladder with a crescentic focus of homogeneous intrinsically T1 hyperintense material in the gallbladder body/fundus which does not demonstrate postcontrast enhancement on subtraction imaging and is favored to reflect impacted biliary sludge. 2. No suspicious biliary or hepatic lesion.     Assessment and Plan:    Diagnoses and all orders for this visit:   Calculus of gallbladder without cholecystitis without obstruction       This is a 71 yo male referred with concerns for gallbladder malignancy based on CT and US findings. His MRCP yesterday shows that what previously appeared to be a mass in the fundus is likely impacted sludge above a large gallstone. His CA19-9 was normal. His previous episode of emesis was unlikely related to his gallbladder, and on questioning he does not have any other symptoms of biliary colic. I think it is very unlikely he has a gallbladder cancer, but he does have a large gallstone and given the very aggressive nature of gallbladder cancer, it is reasonable to proceed with cholecystectomy to definitively rule this out. The details of laparoscopic cholecystectomy were discussed with the patient, including the risks of bleeding, infection, bile leak, and <0.5% risk of common bile duct injury. The patient expressed understanding and agrees to proceed  with surgery.    [name], MD [facility] Surgery General, Hepatobiliary and Pancreatic Surgery 09/17/22 11:40 AM

## 2022-09-17 NOTE — H&P (View-Only) (Signed)
History of Present Illness: Gregory Harrison is a 71 y.o. male who is seen today for evaluation for possible gallbladder cancer. He was first seen by his PCP for evaluation of unexplained weight loss and productive cough. He underwent a CT of the chest for workup, which showed a 74m LUL nodule and incidentally thickening of the gallbladder fundus on the lower cuts of the CT. He then had an UKoreathat showed an apparent soft tissue mass in the fundus of the gallbladder. He was referred to Dr. TGeorgette Doverand then sent for an MRCP to further evaluate this. He also had a PET on 9/28 for workup of the lung nodule, which did not show any uptake in either the lung or the gallbladder. CA19-9 on 9/20 was normal at 23. LFTs were also normal.   Yesterday he had his MRCP, which showed a large gallstone and hyperintense material in the fundus of the gallbladder with was most consistent with impacted sludge. No masses were noted. Today, he reports that last weekend he had several hours of chills, nausea and vomiting, with some pain across his abdomen, but his primary symptom was vomiting. He otherwise denies postprandial epigastric or RUQ abdominal pain.   He has never had any previous abdominal surgeries.    Review of Systems: A complete review of systems was obtained from the patient.  I have reviewed this information and discussed as appropriate with the patient.  See HPI as well for other ROS.     Medical History: Past Medical History Past Medical History: Diagnosis Date  Diabetes mellitus without complication (CMS-HCC)    GERD (gastroesophageal reflux disease)    Hyperlipidemia    Hypertension        Patient Active Problem List Diagnosis  Atherosclerosis of aorta (CMS-HCC)  Bilateral primary osteoarthritis of knee  Chronic left shoulder pain  Diabetic neuropathy (CMS-HCC)  Hyperlipidemia associated with type 2 diabetes mellitus   Hypertension associated with diabetes      Past Surgical History Past  Surgical History: Procedure Laterality Date  hand surgery      nose surgery          AllergiesExpand by Default No Known Allergies    Current Outpatient Medications on File Prior to Visit Medication Sig Dispense Refill  amLODIPine (NORVASC) 2.5 MG tablet Take by mouth      atorvastatin (LIPITOR) 40 MG tablet Take 1 tablet by mouth once daily      gabapentin (NEURONTIN) 400 MG capsule Take by mouth      hydroCHLOROthiazide (HYDRODIURIL) 12.5 MG tablet Take 1 tablet by mouth once daily      HYDROcodone-acetaminophen (NORCO) 5-325 mg tablet Take by mouth      insulin GLARGINE (BASAGLAR KWIKPEN U-100 INSULIN) pen injector (concentration 100 units/mL) Inject subcutaneously      losartan (COZAAR) 25 MG tablet Take 1 tablet by mouth once daily      meloxicam (MOBIC) 7.5 MG tablet Take 1 tablet by mouth once daily as needed      metFORMIN (GLUCOPHAGE-XR) 500 MG XR tablet Take 2 tablets by mouth 2 (two) times daily       No current facility-administered medications on file prior to visit.     Family History History reviewed. No pertinent family history.     Social History   Tobacco Use Smoking Status Former  Types: Cigarettes Smokeless Tobacco Never     Social History Social History    Socioeconomic History  Marital status: Married Tobacco Use  Smoking status: Former  Types: Cigarettes  Smokeless tobacco: Never Vaping Use  Vaping Use: Never used Substance and Sexual Activity  Alcohol use: Not Currently  Drug use: Never      Objective:     Vitals:   09/17/22 0940 BP: 132/78 Pulse: 68 Temp: 36.6 C (97.9 F) SpO2: 96% Weight: 75.3 kg (166 lb) Height: 188 cm ('6\' 2"'$ )   Body mass index is 21.31 kg/m.   Physical Exam Constitutional:      General: He is not in acute distress.    Appearance: Normal appearance.  HENT:     Head: Normocephalic and atraumatic.  Eyes:     General: No scleral icterus.    Conjunctiva/sclera: Conjunctivae normal.  Pulmonary:      Effort: Pulmonary effort is normal. No respiratory distress.  Abdominal:     General: There is no distension.     Palpations: Abdomen is soft. There is no mass.     Tenderness: There is no abdominal tenderness.     Comments: No surgical scars.  Musculoskeletal:        General: Normal range of motion.  Skin:    General: Skin is warm and dry.     Coloration: Skin is not jaundiced.  Neurological:     General: No focal deficit present.     Mental Status: He is oriented to person, place, and time.          Labs, Imaging and Diagnostic Testing: MRCP 09/14/22: IMPRESSION: 1. Large gallstone in the proximal gallbladder with a crescentic focus of homogeneous intrinsically T1 hyperintense material in the gallbladder body/fundus which does not demonstrate postcontrast enhancement on subtraction imaging and is favored to reflect impacted biliary sludge. 2. No suspicious biliary or hepatic lesion.     Assessment and Plan:    Diagnoses and all orders for this visit:   Calculus of gallbladder without cholecystitis without obstruction       This is a 71 yo male referred with concerns for gallbladder malignancy based on CT and US findings. His MRCP yesterday shows that what previously appeared to be a mass in the fundus is likely impacted sludge above a large gallstone. His CA19-9 was normal. His previous episode of emesis was unlikely related to his gallbladder, and on questioning he does not have any other symptoms of biliary colic. I think it is very unlikely he has a gallbladder cancer, but he does have a large gallstone and given the very aggressive nature of gallbladder cancer, it is reasonable to proceed with cholecystectomy to definitively rule this out. The details of laparoscopic cholecystectomy were discussed with the patient, including the risks of bleeding, infection, bile leak, and <0.5% risk of common bile duct injury. The patient expressed understanding and agrees to proceed  with surgery.    [name], MD [facility] Surgery General, Hepatobiliary and Pancreatic Surgery 09/17/22 11:40 AM

## 2022-09-18 MED ORDER — PROCHLORPERAZINE MALEATE 10 MG PO TABS: 10.0000 mg | ORAL_TABLET | Freq: Four times a day (QID) | ORAL | 0 refills | Status: DC | PRN

## 2022-09-18 NOTE — Addendum Note (Signed)
Addended by: Dede Query T on: 09/18/2022 03:58 PM   Modules accepted: Orders

## 2022-09-18 NOTE — Telephone Encounter (Signed)
I sent compazine 10 mg as an alternative. Please notify patient and let him know of side effects including drowsiness.

## 2022-09-19 ENCOUNTER — Telehealth: Payer: Self-pay

## 2022-09-19 NOTE — Telephone Encounter (Signed)
Contacted pt to let him know that [facility] Hodgkins called pt in compazine d/t insurance not approving Zofran. Pt states he was able to get Zofran with GoodRx coupon. Pt is currently taking zofran with good results.

## 2022-09-20 ENCOUNTER — Other Ambulatory Visit: Payer: PPO

## 2022-10-07 NOTE — Pre-Procedure Instructions (Signed)
Surgical Instructions    Your procedure is scheduled on October 16, 2022.  Report to [facility] Main Entrance "A" at 6:30 A.M., then check in with the Admitting office.  Call this number if you have problems the morning of surgery:  [phone]   If you have any questions prior to your surgery date call [phone]: Open Monday-Friday 8am-4pm    Remember:  Do not eat after midnight the night before your surgery  You may drink clear liquids until 5:30 AM the morning of your surgery.   Clear liquids allowed are: Water, Non-Citrus Juices (without pulp), Carbonated Beverages, Clear Tea, Black Coffee Only (NO MILK, CREAM OR POWDERED CREAMER of any kind), and Gatorade.     Take these medicines the morning of surgery with A SIP OF WATER:  atorvastatin (LIPITOR)      Take these medicines the morning of surgery with a sip of water AS NEEDED:  fluticasone (FLONASE) nasal spray  HYDROcodone-acetaminophen (NORCO/VICODIN)  ondansetron (ZOFRAN-ODT)    As of today, STOP taking any Aspirin (unless otherwise instructed by your surgeon) Aleve, Naproxen, Ibuprofen, Motrin, Advil, Goody's, BC's, all herbal medications, fish oil, and all vitamins. This includes your medication: meloxicam (MOBIC)                      WHAT DO I DO ABOUT MY DIABETES MEDICATION?   Do not take metFORMIN (GLUCOPHAGE-XR) the morning of surgery.  STOP taking empagliflozin (JARDIANCE) three days prior to your surgery. Your last dose of this medication will be Saturday, November 4th.  If you take Insulin Glargine [facility]) in the evening/bedtime, take 50% of normal dose (16 units) the evening prior to surgery and none the morning of surgery.  If you take Insulin Glargine [facility]) in the morning, take usual dose morning prior to surgery and take 50% of normal dose (16 units) the morning of surgery.    HOW TO MANAGE YOUR DIABETES BEFORE AND AFTER SURGERY  Why is it important to control my blood  sugar before and after surgery? Improving blood sugar levels before and after surgery helps healing and can limit problems. A way of improving blood sugar control is eating a healthy diet by:  Eating less sugar and carbohydrates  Increasing activity/exercise  Talking with your doctor about reaching your blood sugar goals High blood sugars (greater than 180 mg/dL) can raise your risk of infections and slow your recovery, so you will need to focus on controlling your diabetes during the weeks before surgery. Make sure that the doctor who takes care of your diabetes knows about your planned surgery including the date and location.  How do I manage my blood sugar before surgery? Check your blood sugar at least 4 times a day, starting 2 days before surgery, to make sure that the level is not too high or low.  Check your blood sugar the morning of your surgery when you wake up and every 2 hours until you get to the Short Stay unit.  If your blood sugar is less than 70 mg/dL, you will need to treat for low blood sugar: Do not take insulin. Treat a low blood sugar (less than 70 mg/dL) with  cup of clear juice (cranberry or apple), 4 glucose tablets, OR glucose gel. Recheck blood sugar in 15 minutes after treatment (to make sure it is greater than 70 mg/dL). If your blood sugar is not greater than 70 mg/dL on recheck, call [phone] for further instructions. Report your  blood sugar to the short stay nurse when you get to Short Stay.  If you are admitted to the hospital after surgery: Your blood sugar will be checked by the staff and you will probably be given insulin after surgery (instead of oral diabetes medicines) to make sure you have good blood sugar levels. The goal for blood sugar control after surgery is 80-180 mg/dL.  Do NOT Smoke (Tobacco/Vaping) for 24 hours prior to your procedure.  If you use a CPAP at night, you may bring your mask/headgear for your overnight stay.   Contacts,  glasses, piercing's, hearing aid's, dentures or partials may not be worn into surgery, please bring cases for these belongings.    For patients admitted to the hospital, discharge time will be determined by your treatment team.   Patients discharged the day of surgery will not be allowed to drive home, and someone needs to stay with them for 24 hours.  SURGICAL WAITING ROOM VISITATION Patients having surgery or a procedure may have no more than 2 support people in the waiting area - these visitors may rotate.   Children under the age of 67 must have an adult with them who is not the patient. If the patient needs to stay at the hospital during part of their recovery, the visitor guidelines for inpatient rooms apply. Pre-op nurse will coordinate an appropriate time for 1 support person to accompany patient in pre-op.  This support person may not rotate.   Please refer to the [facility] website for the visitor guidelines for Inpatients (after your surgery is over and you are in a regular room).    Special instructions:   [place]- Preparing For Surgery  Before surgery, you can play an important role. Because skin is not sterile, your skin needs to be as free of germs as possible. You can reduce the number of germs on your skin by washing with CHG (chlorahexidine gluconate) Soap before surgery.  CHG is an antiseptic cleaner which kills germs and bonds with the skin to continue killing germs even after washing.    Oral Hygiene is also important to reduce your risk of infection.  Remember - BRUSH YOUR TEETH THE MORNING OF SURGERY WITH YOUR REGULAR TOOTHPASTE  Please do not use if you have an allergy to CHG or antibacterial soaps. If your skin becomes reddened/irritated stop using the CHG.  Do not shave (including legs and underarms) for at least 48 hours prior to first CHG shower. It is OK to shave your face.  Please follow these instructions carefully.   Shower the NIGHT BEFORE SURGERY and  the MORNING OF SURGERY  If you chose to wash your hair, wash your hair first as usual with your normal shampoo.  After you shampoo, rinse your hair and body thoroughly to remove the shampoo.  Use CHG Soap as you would any other liquid soap. You can apply CHG directly to the skin and wash gently with a scrungie or a clean washcloth.   Apply the CHG Soap to your body ONLY FROM THE NECK DOWN.  Do not use on open wounds or open sores. Avoid contact with your eyes, ears, mouth and genitals (private parts). Wash Face and genitals (private parts)  with your normal soap.   Wash thoroughly, paying special attention to the area where your surgery will be performed.  Thoroughly rinse your body with warm water from the neck down.  DO NOT shower/wash with your normal soap after using and rinsing off the  CHG Soap.  Pat yourself dry with a CLEAN TOWEL.  Wear CLEAN PAJAMAS to bed the night before surgery  Place CLEAN SHEETS on your bed the night before your surgery  DO NOT SLEEP WITH PETS.   Day of Surgery: Take a shower with CHG soap. Do not wear jewelry or makeup Do not wear lotions, powders, perfumes/colognes, or deodorant. Do not shave 48 hours prior to surgery.  Men may shave face and neck. Do not bring valuables to the hospital.  [facility] is not responsible for any belongings or valuables. Do not wear nail polish, gel polish, artificial nails, or any other type of covering on natural nails (fingers and toes) If you have artificial nails or gel coating that need to be removed by a nail salon, please have this removed prior to surgery. Artificial nails or gel coating may interfere with anesthesia's ability to adequately monitor your vital signs.  Wear Clean/Comfortable clothing the morning of surgery Remember to brush your teeth WITH YOUR REGULAR TOOTHPASTE.   Please read over the following fact sheets that you were given.    If you received a COVID test during your pre-op visit  it  is requested that you wear a mask when out in public, stay away from anyone that may not be feeling well and notify your surgeon if you develop symptoms. If you have been in contact with anyone that has tested positive in the last 10 days please notify you surgeon.

## 2022-10-08 ENCOUNTER — Other Ambulatory Visit: Payer: Self-pay

## 2022-10-08 ENCOUNTER — Encounter (HOSPITAL_COMMUNITY)
Admission: RE | Admit: 2022-10-08 | Discharge: 2022-10-08 | Disposition: A | Discharge status: Home / Self Care | Payer: PPO | Source: Ambulatory Visit | Attending: Surgery | Admitting: Surgery

## 2022-10-08 ENCOUNTER — Encounter (HOSPITAL_COMMUNITY): Payer: Self-pay

## 2022-10-08 VITALS — BP 198/75 | HR 63 | Temp 97.5°F | Resp 18 | Ht 74.5 in | Wt 172.0 lb

## 2022-10-08 DIAGNOSIS — Z01818 Encounter for other preprocedural examination: Secondary | ICD-10-CM | POA: Insufficient documentation

## 2022-10-08 DIAGNOSIS — E1142 Type 2 diabetes mellitus with diabetic polyneuropathy: Secondary | ICD-10-CM | POA: Diagnosis not present

## 2022-10-08 LAB — BASIC METABOLIC PANEL
Anion gap: 11 (ref 5–15)
BUN: 10 mg/dL (ref 8–23)
CO2: 27 mmol/L (ref 22–32)
Calcium: 9 mg/dL (ref 8.9–10.3)
Chloride: 102 mmol/L (ref 98–111)
Creatinine, Ser: 0.85 mg/dL (ref 0.61–1.24)
GFR, Estimated: >60 mL/min (ref >60)
Glucose, Bld: 215 mg/dL — ABNORMAL HIGH (ref 70–99)
Potassium: 3.5 mmol/L (ref 3.5–5.1)
Sodium: 140 mmol/L (ref 135–145)

## 2022-10-08 LAB — CBC
HCT: 38.9 % — ABNORMAL LOW (ref 39.0–52.0)
Hemoglobin: 12.5 g/dL — ABNORMAL LOW (ref 13.0–17.0)
MCH: 28.3 pg (ref 26.0–34.0)
MCHC: 32.1 g/dL (ref 30.0–36.0)
MCV: 88.2 fL (ref 80.0–100.0)
Platelets: 281 10*3/uL (ref 150–400)
RBC: 4.41 MIL/uL (ref 4.22–5.81)
RDW: 14.6 % (ref 11.5–15.5)
WBC: 6.6 10*3/uL (ref 4.0–10.5)
nRBC: 0 % (ref 0.0–0.2)

## 2022-10-08 LAB — HEMOGLOBIN A1C
Hgb A1c MFr Bld: 9.4 % — ABNORMAL HIGH (ref 4.8–5.6)
Mean Plasma Glucose: 223.08 mg/dL

## 2022-10-08 LAB — GLUCOSE, CAPILLARY: Glucose-Capillary: 185 mg/dL — ABNORMAL HIGH (ref 70–99)

## 2022-10-08 NOTE — Progress Notes (Addendum)
PCP - Mable Fill Dameron DO- "[facility]" Cardiologist - denies  PPM/ICD - denies  Chest x-ray - 09/19/20 EKG - pending Stress Test - "way back a long time ago" ECHO - denies Cardiac Cath - denies  Sleep Study - denies CPAP - n/a  Fasting Blood Sugar - 145-205 Checks Blood Sugar 1 times a day - "sometimes at night if I think it's high"  Do not take metFORMIN (GLUCOPHAGE-XR) the morning of surgery.  STOP taking empagliflozin (JARDIANCE) three days prior to your surgery. Your last dose of this medication will be Saturday, November 4th.  If you take Insulin Glargine [facility]) in the morning, take usual dose morning prior to surgery and take 50% of normal dose (16 units) the morning of surgery.  As of today, STOP taking any Aspirin (unless otherwise instructed by your surgeon) Aleve, Naproxen, Ibuprofen, Motrin, Advil, Goody's, BC's, all herbal medications, fish oil, and all vitamins. This includes your medication: meloxicam (MOBIC)  ERAS Protcol - yes PRE-SURGERY Ensure or G2- no  COVID TEST- n/a  Anesthesia review: yes- abnormal EKG  Patient denies shortness of breath, fever, cough and chest pain at PAT appointment   All instructions explained to the patient, with a verbal understanding of the material. Patient agrees to go over the instructions while at home for a better understanding. The opportunity to ask questions was provided.

## 2022-10-10 ENCOUNTER — Other Ambulatory Visit: Payer: Self-pay

## 2022-10-10 ENCOUNTER — Encounter: Payer: Self-pay | Admitting: Student in an Organized Health Care Education/Training Program

## 2022-10-10 ENCOUNTER — Ambulatory Visit
Payer: PPO | Attending: Student in an Organized Health Care Education/Training Program | Admitting: Student in an Organized Health Care Education/Training Program

## 2022-10-10 DIAGNOSIS — G894 Chronic pain syndrome: Secondary | ICD-10-CM | POA: Insufficient documentation

## 2022-10-10 DIAGNOSIS — G5601 Carpal tunnel syndrome, right upper limb: Secondary | ICD-10-CM

## 2022-10-10 DIAGNOSIS — G8929 Other chronic pain: Secondary | ICD-10-CM

## 2022-10-10 DIAGNOSIS — M25512 Pain in left shoulder: Secondary | ICD-10-CM

## 2022-10-10 DIAGNOSIS — E1142 Type 2 diabetes mellitus with diabetic polyneuropathy: Secondary | ICD-10-CM | POA: Insufficient documentation

## 2022-10-10 DIAGNOSIS — M19012 Primary osteoarthritis, left shoulder: Secondary | ICD-10-CM | POA: Diagnosis not present

## 2022-10-10 MED ORDER — HYDROCODONE-ACETAMINOPHEN 5-325 MG PO TABS: 1.0000 | ORAL_TABLET | Freq: Two times a day (BID) | ORAL | 0 refills | Status: DC | PRN

## 2022-10-10 NOTE — Progress Notes (Signed)
PROVIDER NOTE: Information contained herein reflects review and annotations entered in association with encounter. Interpretation of such information and data should be left to medically-trained personnel. Information provided to patient can be located elsewhere in the medical record under "Patient Instructions". Document created using STT-dictation technology, any transcriptional errors that may result from process are unintentional.    Patient: Gregory Harrison  Service Category: E/M  Provider: Gillis Santa, MD  DOB: Jan 18, 1951  DOS: 10/10/2022  Specialty: Interventional Pain Management  MRN: [id number]  Setting: Ambulatory outpatient  PCP: Orvis Brill, DO  Type: Established Patient    Referring Provider: Orvis Brill, DO  Location: Office  Delivery: Face-to-face     HPI  Gregory Harrison, a 71 y.o. year old male, is here today because of his No primary diagnosis found.. Gregory Harrison's primary complain today is Knee Pain (bilateral) and Back Pain (low) Last encounter: My last encounter with him was on 06/20/22  Pertinent problems: Gregory Harrison has Type 2 diabetes mellitus with diabetic polyneuropathy, without long-term current use of insulin ([place]); Chronic left shoulder pain; Carpal tunnel syndrome of right wrist; Trigger finger of right thumb; Lumbar facet arthropathy; Lumbar degenerative disc disease; Bilateral primary osteoarthritis of knee; Primary osteoarthritis of left shoulder; and Trigger finger, right little finger on their pertinent problem list. Pain Assessment: Severity of Chronic pain is reported as a 10-Worst pain ever/10. Location: Knee Right, Left/back of leg at times. Onset: More than a month ago. Quality: Aching, Constant. Timing: Constant. Modifying factor(s): topicals, elevation, medications. Vitals:  height is 6' 2.5" (1.892 m) and weight is 166 lb (75.3 kg). His temporal temperature is 98.4 F (36.9 C). His blood pressure is 138/81 and his pulse is 84. His respiration is 16 and  oxygen saturation is 97%.   Reason for encounter: medication management   Kingsten presents today for medication management.  No change in his medical history other than his dog hit his leg yesterday and his right knee has been hurting. He has been elevating his knee which he states is helpful.  I offered him a knee x-ray but he deferred.  He states that if it does not get better over the next week or 2 he will let me know.    Pharmacotherapy Assessment  Analgesic: Hydrocodone 5 mg twice daily as needed, quantity 60/month MME equals 10    Monitoring: [place] PMP: PDMP reviewed during this encounter.       Pharmacotherapy: No side-effects or adverse reactions reported. Compliance: No problems identified. Effectiveness: Clinically acceptable.  Hart Rochester, RN  10/10/2022  8:14 AM  Signed Nursing Pain Medication Assessment:  Safety precautions to be maintained throughout the outpatient stay will include: orient to surroundings, keep bed in low position, maintain call bell within reach at all times, provide assistance with transfer out of bed and ambulation.  Medication Inspection Compliance: Gregory Harrison did not comply with our request to bring his pills to be counted. He was reminded that bringing the medication bottles, even when empty, is a requirement.  Medication: Hydrocodone/APAP Pill/Patch Count: No pills available to be counted. Pill/Patch Appearance: No markings Bottle Appearance: Standard pharmacy container. Clearly labeled. Filled Date: ? / ? / 2023 Last Medication intake:  Yesterday     UDS:  Summary  Date Value Ref Range Status  03/14/2022 Note  Final    Comment:    ==================================================================== [facility] (MW) ==================================================================== Test  Result       Flag       Units  Drug Absent but Declared for Prescription Verification   Hydrocodone                     Not Detected UNEXPECTED ng/mg creat ==================================================================== Test                      Result    Flag   Units      Ref Range   Creatinine              45               mg/dL      >=20 ==================================================================== Declared Medications:  The flagging and interpretation on this report are based on the  following declared medications.  Unexpected results may arise from  inaccuracies in the declared medications.   **Note: The testing scope of this panel includes these medications:   Hydrocodone (Norco)   **Note: The testing scope of this panel does not include the  following reported medications:   Acetaminophen (Norco)  Amlodipine (Norvasc)  Atorvastatin (Lipitor)  Gabapentin (Neurontin)  Hydrochlorothiazide (Hydrodiuril)  Insulin (Basaglar)  Losartan (Cozaar)  Meloxicam (Mobic)  Metformin (Glucophage)  Sitagliptin (Januvia)  Topical ==================================================================== For clinical consultation, please call [phone]. ====================================================================      ROS  Constitutional: Denies any fever or chills Gastrointestinal: No reported hemesis, hematochezia, vomiting, or acute GI distress Musculoskeletal:  Left knee pain Neurological: No reported episodes of acute onset apraxia, aphasia, dysarthria, agnosia, amnesia, paralysis, loss of coordination, or loss of consciousness  Medication Review  Alcohol Swabs, Basaglar KwikPen, Emollient, HYDROcodone-acetaminophen, Insulin Pen Needle, ONE TOUCH DELICA LANCING DEV, OneTouch Delica Lancets Fine, OneTouch Verio, OneTouch Verio Reflect, amLODipine, atorvastatin, blood glucose meter kit and supplies, empagliflozin, fluticasone, gabapentin, glucose blood, hydrochlorothiazide, losartan, meloxicam, metFORMIN, ondansetron, and prochlorperazine  History Review  Allergy: Gregory Harrison  has No Known Allergies. Drug: Gregory Harrison  reports that he does not currently use drugs after having used the following drugs: Marijuana. Alcohol:  reports that he does not currently use alcohol. Tobacco:  reports that he quit smoking about 34 years ago. His smoking use included cigarettes. He has a 40.00 pack-year smoking history. He has never used smokeless tobacco. Social: Gregory Harrison  reports that he quit smoking about 34 years ago. His smoking use included cigarettes. He has a 40.00 pack-year smoking history. He has never used smokeless tobacco. He reports that he does not currently use alcohol. He reports that he does not currently use drugs after having used the following drugs: Marijuana. Medical:  has a past medical history of Arthritis, Cancer ([place]) (2023), Diabetes ([place]) (2000), Diabetic neuropathy ([place]), GERD (gastroesophageal reflux disease), HTN (hypertension) (2010), Hypercholesterolemia, Vision problem, and Weight loss (07/16/2019). Surgical: Gregory Harrison  has a past surgical history that includes Hand surgery (1980s); Nose surgery (1979); Eye surgery (Left, 1990s); and Tonsillectomy. Family: family history includes ALS in his brother; Diabetes in his paternal grandfather; Heart attack in his father; Heart murmur in his father; Other in his mother.  Laboratory Chemistry Profile   Renal Lab Results  Component Value Date   BUN 10 10/08/2022   CREATININE 0.85 10/08/2022   BCR 21 08/08/2022   GFRAA 88 10/19/2020   GFRNONAA >60 10/08/2022    Hepatic Lab Results  Component Value Date   AST 16 08/28/2022   ALT 21 08/28/2022   ALBUMIN 4.0 08/28/2022  ALKPHOS 47 08/28/2022    Electrolytes Lab Results  Component Value Date   NA 140 10/08/2022   K 3.5 10/08/2022   CL 102 10/08/2022   CALCIUM 9.0 10/08/2022    Bone No results found for: "VD25OH", "VD125OH2TOT", "MO2947ML4", "YT0354SF6", "25OHVITD1", "25OHVITD2", "25OHVITD3", "TESTOFREE", "TESTOSTERONE"  Inflammation (CRP: Acute  Phase) (ESR: Chronic Phase) Lab Results  Component Value Date   ESRSEDRATE 20 08/08/2022         Note: Above Lab results reviewed.  Recent Imaging Review  MR ABDOMEN MRCP W WO CONTAST CLINICAL DATA:  Further evaluation of abnormality in gallbladder seen on prior examination.  EXAM: MRI ABDOMEN WITHOUT AND WITH CONTRAST (INCLUDING MRCP)  TECHNIQUE: Multiplanar multisequence MR imaging of the abdomen was performed both before and after the administration of intravenous contrast. Heavily T2-weighted images of the biliary and pancreatic ducts were obtained, and three-dimensional MRCP images were rendered by post processing.  CONTRAST:  80m MULTIHANCE GADOBENATE DIMEGLUMINE 529 MG/ML IV SOLN  COMPARISON:  Right upper quadrant ultrasound August 21, 2022 and PET-CT September 05, 2022.  FINDINGS: Lower chest: No acute abnormality.  Hepatobiliary: No significant hepatic steatosis. No suspicious hepatic lesion  Large gallstone in the proximal gallbladder measures 3.8 x 2.8 cm. Distal this in the gallbladder fundus is a crescentic focus of homogeneous intrinsically T1 hyperintense material measuring 4.1 x 2.4 cm on image 52/24 which does not demonstrate postcontrast enhancement on subtraction imaging.  No biliary ductal dilation.  Pancreas: Mild senescent atrophy of the pancreatic parenchyma. No pancreatic ductal dilation. No cystic or solid hyperenhancing pancreatic lesion identified.  Spleen:  No splenomegaly or focal splenic lesion.  Adrenals/Urinary Tract: Bilateral adrenal glands appear normal. No hydronephrosis. Fluid signal bilateral renal lesions measure up to 11 mm in the right upper pole, without suspicious postcontrast enhancement consistent with benign cysts which require no independent imaging follow-up.  Stomach/Bowel: Visualized portions within the abdomen are unremarkable.  Vascular/Lymphatic: Normal caliber abdominal aorta. The portal, splenic and  superior mesenteric veins are patent. No pathologically enlarged abdominal lymph nodes.  Other:  No significant abdominal free fluid.  Musculoskeletal: No suspicious bone lesions identified.  IMPRESSION: 1. Large gallstone in the proximal gallbladder with a crescentic focus of homogeneous intrinsically T1 hyperintense material in the gallbladder body/fundus which does not demonstrate postcontrast enhancement on subtraction imaging and is favored to reflect impacted biliary sludge. 2. No suspicious biliary or hepatic lesion.  Electronically Signed   By: JDahlia BailiffM.D.   On: 09/16/2022 12:50 Note: Reviewed        Physical Exam  General appearance: Well nourished, well developed, and well hydrated. In no apparent acute distress Mental status: Alert, oriented x 3 (person, place, & time)       Respiratory: No evidence of acute respiratory distress Eyes: PERLA Vitals: BP 138/81   Pulse 84   Temp 98.4 F (36.9 C) (Temporal)   Resp 16   Ht 6' 2.5" (1.892 m)   Wt 166 lb (75.3 kg)   SpO2 97%   BMI 21.03 kg/m  BMI: Estimated body mass index is 21.03 kg/m as calculated from the following:   Height as of this encounter: 6' 2.5" (1.892 m).   Weight as of this encounter: 166 lb (75.3 kg). Ideal: Ideal body weight: 83.4 kg (183 lb 12.1 oz)  Left knee pain  Assessment   Diagnosis  1. Chronic pain syndrome   2. Chronic left shoulder pain   3. Carpal tunnel syndrome of right wrist   4. Primary  osteoarthritis of left shoulder   5. Diabetic polyneuropathy associated with type 2 diabetes mellitus ([place])        Plan of Care   1. Chronic pain syndrome - HYDROcodone-acetaminophen (NORCO/VICODIN) 5-325 MG tablet; Take 1 tablet by mouth 2 (two) times daily as needed for moderate pain. For chronic pain syndrome  Dispense: 60 tablet; Refill: 0 - HYDROcodone-acetaminophen (NORCO/VICODIN) 5-325 MG tablet; Take 1 tablet by mouth 2 (two) times daily as needed for moderate pain. For  chronic pain syndrome  Dispense: 60 tablet; Refill: 0 - HYDROcodone-acetaminophen (NORCO/VICODIN) 5-325 MG tablet; Take 1 tablet by mouth 2 (two) times daily as needed for moderate pain. For chronic pain syndrome  Dispense: 60 tablet; Refill: 0  2. Chronic left shoulder pain - HYDROcodone-acetaminophen (NORCO/VICODIN) 5-325 MG tablet; Take 1 tablet by mouth 2 (two) times daily as needed for moderate pain. For chronic pain syndrome  Dispense: 60 tablet; Refill: 0 - HYDROcodone-acetaminophen (NORCO/VICODIN) 5-325 MG tablet; Take 1 tablet by mouth 2 (two) times daily as needed for moderate pain. For chronic pain syndrome  Dispense: 60 tablet; Refill: 0 - HYDROcodone-acetaminophen (NORCO/VICODIN) 5-325 MG tablet; Take 1 tablet by mouth 2 (two) times daily as needed for moderate pain. For chronic pain syndrome  Dispense: 60 tablet; Refill: 0  3. Carpal tunnel syndrome of right wrist - HYDROcodone-acetaminophen (NORCO/VICODIN) 5-325 MG tablet; Take 1 tablet by mouth 2 (two) times daily as needed for moderate pain. For chronic pain syndrome  Dispense: 60 tablet; Refill: 0 - HYDROcodone-acetaminophen (NORCO/VICODIN) 5-325 MG tablet; Take 1 tablet by mouth 2 (two) times daily as needed for moderate pain. For chronic pain syndrome  Dispense: 60 tablet; Refill: 0 - HYDROcodone-acetaminophen (NORCO/VICODIN) 5-325 MG tablet; Take 1 tablet by mouth 2 (two) times daily as needed for moderate pain. For chronic pain syndrome  Dispense: 60 tablet; Refill: 0  4. Primary osteoarthritis of left shoulder - HYDROcodone-acetaminophen (NORCO/VICODIN) 5-325 MG tablet; Take 1 tablet by mouth 2 (two) times daily as needed for moderate pain. For chronic pain syndrome  Dispense: 60 tablet; Refill: 0 - HYDROcodone-acetaminophen (NORCO/VICODIN) 5-325 MG tablet; Take 1 tablet by mouth 2 (two) times daily as needed for moderate pain. For chronic pain syndrome  Dispense: 60 tablet; Refill: 0 - HYDROcodone-acetaminophen (NORCO/VICODIN)  5-325 MG tablet; Take 1 tablet by mouth 2 (two) times daily as needed for moderate pain. For chronic pain syndrome  Dispense: 60 tablet; Refill: 0  5. Diabetic polyneuropathy associated with type 2 diabetes mellitus ([facility]) - HYDROcodone-acetaminophen (NORCO/VICODIN) 5-325 MG tablet; Take 1 tablet by mouth 2 (two) times daily as needed for moderate pain. For chronic pain syndrome  Dispense: 60 tablet; Refill: 0 - HYDROcodone-acetaminophen (NORCO/VICODIN) 5-325 MG tablet; Take 1 tablet by mouth 2 (two) times daily as needed for moderate pain. For chronic pain syndrome  Dispense: 60 tablet; Refill: 0 - HYDROcodone-acetaminophen (NORCO/VICODIN) 5-325 MG tablet; Take 1 tablet by mouth 2 (two) times daily as needed for moderate pain. For chronic pain syndrome  Dispense: 60 tablet; Refill: 0  UDS up-to-date and appropriate. Provided him with surgeons letter regarding perioperative opioid prescribing since he has a cholecystectomy coming up next week.   Pharmacotherapy (Medications Ordered): Meds ordered this encounter  Medications   HYDROcodone-acetaminophen (NORCO/VICODIN) 5-325 MG tablet    Sig: Take 1 tablet by mouth 2 (two) times daily as needed for moderate pain. For chronic pain syndrome    Dispense:  60 tablet    Refill:  0   HYDROcodone-acetaminophen (NORCO/VICODIN) 5-325 MG tablet  Sig: Take 1 tablet by mouth 2 (two) times daily as needed for moderate pain. For chronic pain syndrome    Dispense:  60 tablet    Refill:  0   HYDROcodone-acetaminophen (NORCO/VICODIN) 5-325 MG tablet    Sig: Take 1 tablet by mouth 2 (two) times daily as needed for moderate pain. For chronic pain syndrome    Dispense:  60 tablet    Refill:  0   Orders:  No orders of the defined types were placed in this encounter.  Follow-up plan:   Return in about 3 months (around 01/10/2023) for Medication Management, in person.    Recent Visits No visits were found meeting these conditions. Showing recent visits  within past 90 days and meeting all other requirements Today's Visits Date Type Provider Dept  10/10/22 Office Visit Gillis Santa, MD [facility]  Showing today's visits and meeting all other requirements Future Appointments Date Type Provider Dept  01/02/23 Appointment Gillis Santa, MD [facility]  Showing future appointments within next 90 days and meeting all other requirements  I discussed the assessment and treatment plan with the patient. The patient was provided an opportunity to ask questions and all were answered. The patient agreed with the plan and demonstrated an understanding of the instructions.  Patient advised to call back or seek an in-person evaluation if the symptoms or condition worsens.  Duration of encounter: 30mnutes.  Note by: BGillis Santa MD Date: 10/10/2022; Time: 9:08 AM

## 2022-10-10 NOTE — Progress Notes (Signed)
Nursing Pain Medication Assessment:  Safety precautions to be maintained throughout the outpatient stay will include: orient to surroundings, keep bed in low position, maintain call bell within reach at all times, provide assistance with transfer out of bed and ambulation.  Medication Inspection Compliance: Gregory Harrison did not comply with our request to bring his pills to be counted. He was reminded that bringing the medication bottles, even when empty, is a requirement.  Medication: Hydrocodone/APAP Pill/Patch Count: No pills available to be counted. Pill/Patch Appearance: No markings Bottle Appearance: Standard pharmacy container. Clearly labeled. Filled Date: ? / ? / 2023 Last Medication intake:  Yesterday

## 2022-10-15 NOTE — Anesthesia Preprocedure Evaluation (Addendum)
Anesthesia Evaluation  Patient identified by MRN, date of birth, ID band Patient awake    Reviewed: Allergy & Precautions, H&P , NPO status , Patient's Chart, lab work & pertinent test results  Airway Mallampati: I  TM Distance: >3 FB Neck ROM: Full    Dental no notable dental hx. (+) Edentulous Upper, Edentulous Lower,    Pulmonary neg pulmonary ROS, former smoker   Pulmonary exam normal breath sounds clear to auscultation       Cardiovascular Exercise Tolerance: Good hypertension, Pt. on medications negative cardio ROS Normal cardiovascular exam Rhythm:Regular Rate:Normal     Neuro/Psych negative neurological ROS  negative psych ROS   GI/Hepatic negative GI ROS, Neg liver ROS,GERD  Medicated,,  Endo/Other  negative endocrine ROSdiabetes, Type 2    Renal/GU negative Renal ROS  negative genitourinary   Musculoskeletal negative musculoskeletal ROS (+) Arthritis , Osteoarthritis,    Abdominal   Peds negative pediatric ROS (+)  Hematology negative hematology ROS (+)   Anesthesia Other Findings   Reproductive/Obstetrics negative OB ROS                             Anesthesia Physical Anesthesia Plan  ASA: 3  Anesthesia Plan: General   Post-op Pain Management: Tylenol PO (pre-op)* and Celebrex PO (pre-op)*   Induction: Intravenous  PONV Risk Score and Plan: 2 and Ondansetron, Dexamethasone and Treatment may vary due to age or medical condition  Airway Management Planned: Oral ETT  Additional Equipment: None  Intra-op Plan:   Post-operative Plan: Extubation in OR  Informed Consent: I have reviewed the patients History and Physical, chart, labs and discussed the procedure including the risks, benefits and alternatives for the proposed anesthesia with the patient or authorized representative who has indicated his/her understanding and acceptance.       Plan Discussed with:  Anesthesiologist  Anesthesia Plan Comments: (  )        Anesthesia Quick Evaluation

## 2022-10-16 ENCOUNTER — Other Ambulatory Visit: Payer: Self-pay

## 2022-10-16 ENCOUNTER — Encounter (HOSPITAL_COMMUNITY): Payer: Self-pay | Admitting: Surgery

## 2022-10-16 ENCOUNTER — Encounter (HOSPITAL_COMMUNITY)
Admission: RE | Disposition: A | Discharge status: Home / Self Care | Payer: Self-pay | Source: Ambulatory Visit | Attending: Surgery

## 2022-10-16 ENCOUNTER — Ambulatory Visit (HOSPITAL_COMMUNITY): Payer: PPO | Admitting: Physician Assistant

## 2022-10-16 ENCOUNTER — Ambulatory Visit (HOSPITAL_BASED_OUTPATIENT_CLINIC_OR_DEPARTMENT_OTHER): Payer: PPO | Admitting: General Practice

## 2022-10-16 ENCOUNTER — Ambulatory Visit (HOSPITAL_COMMUNITY)
Admission: RE | Admit: 2022-10-16 | Discharge: 2022-10-16 | Disposition: A | Discharge status: Home / Self Care | Payer: PPO | Source: Ambulatory Visit | Attending: Surgery | Admitting: Surgery

## 2022-10-16 DIAGNOSIS — Z794 Long term (current) use of insulin: Secondary | ICD-10-CM | POA: Diagnosis not present

## 2022-10-16 DIAGNOSIS — K7689 Other specified diseases of liver: Secondary | ICD-10-CM | POA: Insufficient documentation

## 2022-10-16 DIAGNOSIS — K219 Gastro-esophageal reflux disease without esophagitis: Secondary | ICD-10-CM | POA: Insufficient documentation

## 2022-10-16 DIAGNOSIS — Z7984 Long term (current) use of oral hypoglycemic drugs: Secondary | ICD-10-CM | POA: Insufficient documentation

## 2022-10-16 DIAGNOSIS — K8010 Calculus of gallbladder with chronic cholecystitis without obstruction: Secondary | ICD-10-CM | POA: Insufficient documentation

## 2022-10-16 DIAGNOSIS — Z87891 Personal history of nicotine dependence: Secondary | ICD-10-CM | POA: Diagnosis not present

## 2022-10-16 DIAGNOSIS — I10 Essential (primary) hypertension: Secondary | ICD-10-CM

## 2022-10-16 DIAGNOSIS — E119 Type 2 diabetes mellitus without complications: Secondary | ICD-10-CM | POA: Diagnosis not present

## 2022-10-16 DIAGNOSIS — K8020 Calculus of gallbladder without cholecystitis without obstruction: Secondary | ICD-10-CM

## 2022-10-16 DIAGNOSIS — E1142 Type 2 diabetes mellitus with diabetic polyneuropathy: Secondary | ICD-10-CM

## 2022-10-16 HISTORY — PX: CHOLECYSTECTOMY: SHX55

## 2022-10-16 LAB — GLUCOSE, CAPILLARY
Glucose-Capillary: 114 mg/dL — ABNORMAL HIGH (ref 70–99)
Glucose-Capillary: 207 mg/dL — ABNORMAL HIGH (ref 70–99)

## 2022-10-16 SURGERY — LAPAROSCOPIC CHOLECYSTECTOMY
Anesthesia: General | Site: Abdomen

## 2022-10-16 MED ORDER — LIDOCAINE 2% (20 MG/ML) 5 ML SYRINGE
INTRAMUSCULAR | Status: AC
  Filled 2022-10-16: qty 5

## 2022-10-16 MED ORDER — HYDROMORPHONE HCL 1 MG/ML IJ SOLN
INTRAMUSCULAR | Status: DC | PRN
  Administered 2022-10-16 (×2): .25 mg via INTRAVENOUS

## 2022-10-16 MED ORDER — ACETAMINOPHEN 500 MG PO TABS
1000.0000 mg | ORAL_TABLET | Freq: Once | ORAL | Status: AC
  Administered 2022-10-16: 1000 mg via ORAL
  Filled 2022-10-16: qty 2

## 2022-10-16 MED ORDER — SODIUM CHLORIDE 0.9 % IR SOLN
Status: DC | PRN
  Administered 2022-10-16: 1000 mL

## 2022-10-16 MED ORDER — FENTANYL CITRATE (PF) 250 MCG/5ML IJ SOLN
INTRAMUSCULAR | Status: AC
  Filled 2022-10-16: qty 5

## 2022-10-16 MED ORDER — MEPERIDINE HCL 25 MG/ML IJ SOLN: 6.2500 mg | INTRAMUSCULAR | Status: DC | PRN

## 2022-10-16 MED ORDER — MIDAZOLAM HCL 2 MG/2ML IJ SOLN
INTRAMUSCULAR | Status: AC
  Filled 2022-10-16: qty 2

## 2022-10-16 MED ORDER — FENTANYL CITRATE (PF) 250 MCG/5ML IJ SOLN
INTRAMUSCULAR | Status: DC | PRN
  Administered 2022-10-16: 50 ug via INTRAVENOUS
  Administered 2022-10-16: 25 ug via INTRAVENOUS
  Administered 2022-10-16: 75 ug via INTRAVENOUS

## 2022-10-16 MED ORDER — OXYCODONE HCL 5 MG PO TABS: 5.0000 mg | ORAL_TABLET | Freq: Once | ORAL | Status: DC | PRN

## 2022-10-16 MED ORDER — MIDAZOLAM HCL 2 MG/2ML IJ SOLN
INTRAMUSCULAR | Status: DC | PRN
  Administered 2022-10-16: 2 mg via INTRAVENOUS

## 2022-10-16 MED ORDER — DEXAMETHASONE SODIUM PHOSPHATE 10 MG/ML IJ SOLN
INTRAMUSCULAR | Status: AC
  Filled 2022-10-16: qty 1

## 2022-10-16 MED ORDER — ROCURONIUM BROMIDE 10 MG/ML (PF) SYRINGE
PREFILLED_SYRINGE | INTRAVENOUS | Status: DC | PRN
  Administered 2022-10-16: 60 mg via INTRAVENOUS
  Administered 2022-10-16: 10 mg via INTRAVENOUS

## 2022-10-16 MED ORDER — HYDROCODONE-ACETAMINOPHEN 5-325 MG PO TABS: 1.0000 | ORAL_TABLET | Freq: Four times a day (QID) | ORAL | 0 refills | Status: DC | PRN

## 2022-10-16 MED ORDER — OXYCODONE HCL 5 MG/5ML PO SOLN: 5.0000 mg | Freq: Once | ORAL | Status: DC | PRN

## 2022-10-16 MED ORDER — CHLORHEXIDINE GLUCONATE 0.12 % MT SOLN
15.0000 mL | Freq: Once | OROMUCOSAL | Status: AC
  Administered 2022-10-16: 15 mL via OROMUCOSAL
  Filled 2022-10-16: qty 15

## 2022-10-16 MED ORDER — BUPIVACAINE-EPINEPHRINE 0.25% -1:200000 IJ SOLN
INTRAMUSCULAR | Status: DC | PRN
  Administered 2022-10-16: 30 mL

## 2022-10-16 MED ORDER — ACETAMINOPHEN 160 MG/5ML PO SOLN: 325.0000 mg | ORAL | Status: DC | PRN

## 2022-10-16 MED ORDER — HYDRALAZINE HCL 20 MG/ML IJ SOLN
INTRAMUSCULAR | Status: AC
  Administered 2022-10-16: 10 mg via INTRAVENOUS
  Filled 2022-10-16: qty 1

## 2022-10-16 MED ORDER — EPHEDRINE SULFATE-NACL 50-0.9 MG/10ML-% IV SOSY
PREFILLED_SYRINGE | INTRAVENOUS | Status: DC | PRN
  Administered 2022-10-16: 5 mg via INTRAVENOUS

## 2022-10-16 MED ORDER — PROPOFOL 10 MG/ML IV BOLUS
INTRAVENOUS | Status: AC
  Filled 2022-10-16: qty 20

## 2022-10-16 MED ORDER — ACETAMINOPHEN 325 MG PO TABS: 325.0000 mg | ORAL_TABLET | ORAL | Status: DC | PRN

## 2022-10-16 MED ORDER — ACETAMINOPHEN 500 MG PO TABS: 1000.0000 mg | ORAL_TABLET | ORAL | Status: AC

## 2022-10-16 MED ORDER — BUPIVACAINE-EPINEPHRINE (PF) 0.25% -1:200000 IJ SOLN
INTRAMUSCULAR | Status: AC
  Filled 2022-10-16: qty 30

## 2022-10-16 MED ORDER — CEFAZOLIN SODIUM-DEXTROSE 2-4 GM/100ML-% IV SOLN
2.0000 g | INTRAVENOUS | Status: AC
  Administered 2022-10-16: 2 g via INTRAVENOUS
  Filled 2022-10-16: qty 100

## 2022-10-16 MED ORDER — ONDANSETRON HCL 4 MG/2ML IJ SOLN: 4.0000 mg | Freq: Once | INTRAMUSCULAR | Status: DC | PRN

## 2022-10-16 MED ORDER — SUGAMMADEX SODIUM 200 MG/2ML IV SOLN
INTRAVENOUS | Status: DC | PRN
  Administered 2022-10-16: 200 mg via INTRAVENOUS

## 2022-10-16 MED ORDER — HYDRALAZINE HCL 20 MG/ML IJ SOLN: 20.0000 mg | Freq: Once | INTRAMUSCULAR | Status: AC

## 2022-10-16 MED ORDER — 0.9 % SODIUM CHLORIDE (POUR BTL) OPTIME
TOPICAL | Status: DC | PRN
  Administered 2022-10-16: 1000 mL

## 2022-10-16 MED ORDER — FENTANYL CITRATE (PF) 100 MCG/2ML IJ SOLN
25.0000 ug | INTRAMUSCULAR | Status: DC | PRN
  Administered 2022-10-16 (×2): 25 ug via INTRAVENOUS

## 2022-10-16 MED ORDER — PHENYLEPHRINE 80 MCG/ML (10ML) SYRINGE FOR IV PUSH (FOR BLOOD PRESSURE SUPPORT)
PREFILLED_SYRINGE | INTRAVENOUS | Status: AC
  Filled 2022-10-16: qty 10

## 2022-10-16 MED ORDER — ONDANSETRON HCL 4 MG/2ML IJ SOLN
INTRAMUSCULAR | Status: AC
  Filled 2022-10-16: qty 2

## 2022-10-16 MED ORDER — STERILE WATER FOR IRRIGATION IR SOLN
Status: DC | PRN
  Administered 2022-10-16: 1000 mL

## 2022-10-16 MED ORDER — CELECOXIB 200 MG PO CAPS
200.0000 mg | ORAL_CAPSULE | Freq: Once | ORAL | Status: AC
  Filled 2022-10-16: qty 1

## 2022-10-16 MED ORDER — DEXAMETHASONE SODIUM PHOSPHATE 10 MG/ML IJ SOLN
INTRAMUSCULAR | Status: DC | PRN
  Administered 2022-10-16: 5 mg via INTRAVENOUS

## 2022-10-16 MED ORDER — ORAL CARE MOUTH RINSE: 15.0000 mL | Freq: Once | OROMUCOSAL | Status: AC

## 2022-10-16 MED ORDER — ONDANSETRON HCL 4 MG/2ML IJ SOLN
INTRAMUSCULAR | Status: DC | PRN
  Administered 2022-10-16: 4 mg via INTRAVENOUS

## 2022-10-16 MED ORDER — SUCCINYLCHOLINE CHLORIDE 200 MG/10ML IV SOSY
PREFILLED_SYRINGE | INTRAVENOUS | Status: AC
  Filled 2022-10-16: qty 10

## 2022-10-16 MED ORDER — ROCURONIUM BROMIDE 10 MG/ML (PF) SYRINGE
PREFILLED_SYRINGE | INTRAVENOUS | Status: AC
  Filled 2022-10-16: qty 10

## 2022-10-16 MED ORDER — CELECOXIB 200 MG PO CAPS
ORAL_CAPSULE | ORAL | Status: AC
  Administered 2022-10-16: 200 mg via ORAL
  Filled 2022-10-16: qty 1

## 2022-10-16 MED ORDER — HYDROMORPHONE HCL 1 MG/ML IJ SOLN
INTRAMUSCULAR | Status: AC
  Filled 2022-10-16: qty 0.5

## 2022-10-16 MED ORDER — LIDOCAINE 2% (20 MG/ML) 5 ML SYRINGE
INTRAMUSCULAR | Status: DC | PRN
  Administered 2022-10-16: 100 mg via INTRAVENOUS

## 2022-10-16 MED ORDER — FENTANYL CITRATE (PF) 100 MCG/2ML IJ SOLN
INTRAMUSCULAR | Status: AC
  Filled 2022-10-16: qty 2

## 2022-10-16 MED ORDER — PROPOFOL 10 MG/ML IV BOLUS
INTRAVENOUS | Status: DC | PRN
  Administered 2022-10-16: 150 mg via INTRAVENOUS

## 2022-10-16 MED ORDER — LACTATED RINGERS IV SOLN: INTRAVENOUS | Status: DC

## 2022-10-16 MED ORDER — INSULIN ASPART 100 UNIT/ML IJ SOLN: 0.0000 [IU] | INTRAMUSCULAR | Status: DC | PRN

## 2022-10-16 SURGICAL SUPPLY — 40 items
APPLIER CLIP 5 13 M/L LIGAMAX5 (MISCELLANEOUS) ×1
BAG COUNTER SPONGE SURGICOUNT (BAG) ×1 IMPLANT
BLADE CLIPPER SURG (BLADE) IMPLANT
CANISTER SUCT 3000ML PPV (MISCELLANEOUS) ×1 IMPLANT
CHLORAPREP W/TINT 26 (MISCELLANEOUS) ×1 IMPLANT
CLIP APPLIE 5 13 M/L LIGAMAX5 (MISCELLANEOUS) ×1 IMPLANT
COVER SURGICAL LIGHT HANDLE (MISCELLANEOUS) ×1 IMPLANT
DERMABOND ADVANCED .7 DNX12 (GAUZE/BANDAGES/DRESSINGS) ×1 IMPLANT
DRSG TELFA 3X8 NADH STRL (GAUZE/BANDAGES/DRESSINGS) IMPLANT
ELECT REM PT RETURN 9FT ADLT (ELECTROSURGICAL) ×1
ELECTRODE REM PT RTRN 9FT ADLT (ELECTROSURGICAL) ×1 IMPLANT
ENDOLOOP SUT PDS II  0 18 (SUTURE) ×2
ENDOLOOP SUT PDS II 0 18 (SUTURE) IMPLANT
GLOVE BIOGEL PI IND STRL 6 (GLOVE) ×1 IMPLANT
GLOVE BIOGEL PI MICRO STRL 5.5 (GLOVE) ×1 IMPLANT
GOWN STRL REUS W/ TWL LRG LVL3 (GOWN DISPOSABLE) ×3 IMPLANT
GOWN STRL REUS W/TWL LRG LVL3 (GOWN DISPOSABLE) ×3
KIT BASIN OR (CUSTOM PROCEDURE TRAY) ×1 IMPLANT
KIT TURNOVER KIT B (KITS) ×1 IMPLANT
L-HOOK LAP DISP 36CM (ELECTROSURGICAL) ×1
LHOOK LAP DISP 36CM (ELECTROSURGICAL) ×1 IMPLANT
NS IRRIG 1000ML POUR BTL (IV SOLUTION) ×1 IMPLANT
PAD ARMBOARD 7.5X6 YLW CONV (MISCELLANEOUS) ×1 IMPLANT
PENCIL BUTTON HOLSTER BLD 10FT (ELECTRODE) ×1 IMPLANT
POUCH RETRIEVAL ECOSAC 10 (ENDOMECHANICALS) IMPLANT
POUCH RETRIEVAL ECOSAC 10MM (ENDOMECHANICALS) ×1
POUCH SPECIMEN RETRIEVAL 10MM (ENDOMECHANICALS) IMPLANT
SCISSORS LAP 5X35 DISP (ENDOMECHANICALS) ×1 IMPLANT
SET IRRIG TUBING LAPAROSCOPIC (IRRIGATION / IRRIGATOR) ×1 IMPLANT
SET TUBE SMOKE EVAC HIGH FLOW (TUBING) ×1 IMPLANT
SLEEVE Z-THREAD 5X100MM (TROCAR) IMPLANT
SUT MNCRL AB 4-0 PS2 18 (SUTURE) ×1 IMPLANT
SUT VICRYL 0 UR6 27IN ABS (SUTURE) IMPLANT
TOWEL GREEN STERILE (TOWEL DISPOSABLE) ×1 IMPLANT
TOWEL GREEN STERILE FF (TOWEL DISPOSABLE) ×1 IMPLANT
TRAY LAPAROSCOPIC MC (CUSTOM PROCEDURE TRAY) ×1 IMPLANT
TROCAR BALLN 12MMX100 BLUNT (TROCAR) IMPLANT
TROCAR Z-THREAD OPTICAL 5X100M (TROCAR) ×1 IMPLANT
WARMER LAPAROSCOPE (MISCELLANEOUS) ×1 IMPLANT
WATER STERILE IRR 1000ML POUR (IV SOLUTION) ×1 IMPLANT

## 2022-10-16 NOTE — Anesthesia Postprocedure Evaluation (Signed)
Anesthesia Post Note  Patient: Gregory Harrison  Procedure(s) Performed: LAPAROSCOPIC CHOLECYSTECTOMY (Abdomen)     Patient location during evaluation: PACU Anesthesia Type: General Level of consciousness: awake and alert Pain management: pain level controlled Vital Signs Assessment: post-procedure vital signs reviewed and stable Respiratory status: spontaneous breathing, nonlabored ventilation, respiratory function stable and patient connected to nasal cannula oxygen Cardiovascular status: blood pressure returned to baseline and stable Postop Assessment: no apparent nausea or vomiting Anesthetic complications: no   No notable events documented.  Last Vitals:  Vitals:   10/16/22 1045 10/16/22 1100  BP: (!) 156/65 (!) 159/67  Pulse: 69 66  Resp: 14 20  Temp:  36.4 C  SpO2: 94% 93%    Last Pain:  Vitals:   10/16/22 1100  TempSrc:   PainSc: 3                  Jaclynn Laumann

## 2022-10-16 NOTE — Interval H&P Note (Signed)
History and Physical Interval Note:  10/16/2022 7:13 AM  Gregory Harrison  has presented today for surgery, with the diagnosis of GALLSTONES.  The various methods of treatment have been discussed with the patient and family. After consideration of risks, benefits and other options for treatment, the patient has consented to  Procedure(s): LAPAROSCOPIC CHOLECYSTECTOMY (N/A) as a surgical intervention.  The patient's history has been reviewed, patient examined, no change in status, stable for surgery.  I have reviewed the patient's chart and labs.  Questions were answered to the patient's satisfaction.     [name]

## 2022-10-16 NOTE — Transfer of Care (Signed)
Immediate Anesthesia Transfer of Care Note  Patient: Gregory Harrison  Procedure(s) Performed: LAPAROSCOPIC CHOLECYSTECTOMY (Abdomen)  Patient Location: PACU  Anesthesia Type:General  Level of Consciousness: awake and patient cooperative  Airway & Oxygen Therapy: Patient Spontanous Breathing and Patient connected to face mask oxygen  Post-op Assessment: Report given to RN, Post -op Vital signs reviewed and stable, and Patient moving all extremities  Post vital signs: Reviewed and stable  Last Vitals:  Vitals Value Taken Time  BP 192/77 10/16/22 1004  Temp 35.9 C 10/16/22 1000  Pulse 69 10/16/22 1007  Resp 18 10/16/22 1007  SpO2 100 % 10/16/22 1007  Vitals shown include unvalidated device data.  Last Pain:  Vitals:   10/16/22 0721  TempSrc:   PainSc: 5          Complications: No notable events documented.

## 2022-10-16 NOTE — Op Note (Signed)
Date: 10/16/22  Patient: Gregory Harrison MRN: [id number]  Preoperative Diagnosis: Cholelithiasis Postoperative Diagnosis: Cholelithiasis, benign liver nodule  Procedure: Laparoscopic cholecystectomy, liver biopsy  Harrison: [name], MD Assistant: Malachi Pro, PA-C  EBL: Minimal  Anesthesia: General endotracheal  Specimens:  Liver nodule Gallbladder  Indications: [name] is a 71 yo male who presented with what appeared initially to be an incidental gallbladder mass noted on chest CT. An US showed a soft tissue mass in the fundus of the gallbladder, but follow MRCP showed likely impacted sludge and a large stone. After a discussion of the risks and benefits of surgery vs observation, the decision was made to proceed with cholecystectomy to definitively rule out a neoplastic process.  Findings: Subcentimeter liver nodule in segment 4, negative for malignancy on frozen section. Large stone within the gallbladder, with no findings suspicious for malignancy.  Procedure details: Informed consent was obtained in the preoperative area prior to the procedure. The patient was brought to the operating room and placed on the table in the supine position. General anesthesia was induced and appropriate lines and drains were placed for intraoperative monitoring. Perioperative antibiotics were administered per SCIP guidelines. The abdomen was prepped and draped in the usual sterile fashion. A pre-procedure timeout was taken verifying patient identity, surgical site and procedure to be performed.  A small infraumbilical skin incision was made, the subcutaneous tissue was divided with cautery, and the umbilical stalk was grasped and elevated. The fascia was incised and the peritoneal cavity was directly visualized. A 18m Hassan trocar was placed and the abdomen was insufflated. The peritoneal cavity was inspected with no evidence of visceral or vascular injury. Three 590mports were placed in the right  subcostal margin, all under direct visualization. A suspicious nodule about 8m16mn diameter was noted on the periphery of segment 4. A biopsy forceps was used to obtain a sample of this nodule, which was sent for frozen section and was consistent with a benign lesion.   The fundus of the gallbladder was grasped and retracted cephalad. The infundibulum was retracted laterally. Retraction was somewhat difficult due to the presence of a large stone within the gallbladder. The cystic triangle was dissected out using cautery and blunt dissection, and the critical view of safety was obtained. The cystic artery was clipped, and the cystic duct was then clipped and divided. The cystic duct was mildly dilated and could not be crossed with a clip, so a PDS endoloop was placed on the cystic duct stump. The gallbladder was taken off the liver using cautery, and the specimen was placed in an ecosac and removed via the umbilical port site. This required extension of the incision inferiorly. The gallbladder was examined and opened on the back table. There was sludge within the lumen and a very large stone, but the wall and mucosa were very soft with no nodules, masses, or other findings to suggest malignancy. The specimen was passed off the field and sent for routine pathology. The surgical site was irrigated with saline until the effluent was clear. Hemostasis was achieved in the gallbladder fossa using cautery. The cystic duct and artery stumps were visually inspected and there was no evidence of bile leak or bleeding. The ports were removed under direct visualization and the abdomen was desufflated. The specimen was extracted via the umbilical port site. The umbilical port site fascia was closed with 0 vicryl figure-of-eight sutures. The skin at all port sites was closed with 4-0 monocryl subcuticular suture. Dermabond was  applied.  The patient tolerated the procedure well with no apparent complications. All counts were  correct x2 at the end of the procedure. The patient was extubated and taken to PACU in stable condition.  [name], MD 10/16/22 10:05 AM

## 2022-10-16 NOTE — Discharge Instructions (Addendum)
[facility] DISCHARGE INSTRUCTIONS  Activity No heavy lifting greater than 15 pounds for 4 weeks after surgery. Ok to shower in 24 hours, but do not bathe or submerge incisions underwater. Do not drive while taking narcotic pain medication.  Wound Care Your incisions are covered with skin glue called Dermabond. This will peel off on its own over time. You may shower and allow warm soapy water to run over your incisions. Gently pat dry. Do not submerge your incision underwater. Monitor your incision for any new redness, tenderness, or drainage.  When to Call us: Fever greater than 100.5 New redness, drainage, or swelling at incision site Severe pain, nausea, or vomiting Jaundice (yellowing of the whites of the eyes or skin)  Follow-up You have an appointment scheduled with Dr. Zenia Resides on November 06, 2022 at 9:30am. This will be at the [facility] office at [address] [place], [place], [place]. Please arrive at least 15 minutes prior to your scheduled appointment time.  For questions or concerns, please call the office at [phone].

## 2022-10-16 NOTE — Anesthesia Procedure Notes (Signed)
Procedure Name: Intubation Date/Time: 10/16/2022 8:37 AM  Performed by: Elvin So, CRNAPre-anesthesia Checklist: Patient identified, Emergency Drugs available, Suction available and Patient being monitored Patient Re-evaluated:Patient Re-evaluated prior to induction Oxygen Delivery Method: Circle System Utilized Preoxygenation: Pre-oxygenation with 100% oxygen Induction Type: IV induction Ventilation: Mask ventilation without difficulty Laryngoscope Size: Mac and 4 Grade View: Grade I Tube type: Oral Tube size: 7.5 mm Number of attempts: 1 Airway Equipment and Method: Stylet and Oral airway Placement Confirmation: ETT inserted through vocal cords under direct vision, positive ETCO2 and breath sounds checked- equal and bilateral Secured at: 23 cm Tube secured with: Tape Dental Injury: Teeth and Oropharynx as per pre-operative assessment

## 2022-10-17 ENCOUNTER — Encounter (HOSPITAL_COMMUNITY): Payer: Self-pay | Admitting: Surgery

## 2022-10-17 LAB — SURGICAL PATHOLOGY

## 2022-10-18 ENCOUNTER — Telehealth: Payer: Self-pay | Admitting: Physician Assistant

## 2022-10-18 NOTE — Telephone Encounter (Signed)
I called [name] to review the pathology report form the lap chole on 10/16/2022.  Pathology report revealed no evidence of malignancy. Regarding the lung nodule/paratracheal lymph node seen on the recent CT scan from 08/14/2022. Since PET scan did not show any FDG avidity, we recommend monitoring with serial CT imaging in 3-6 months. We will communicate our recommendations to patient's PCP, Dr. McDiarmid. We can see patient back in clinic as needed.   [name] expressed understanding of the plan provided.

## 2022-10-23 ENCOUNTER — Encounter: Payer: Self-pay | Admitting: Family Medicine

## 2022-10-31 ENCOUNTER — Other Ambulatory Visit: Payer: Self-pay | Admitting: Student

## 2022-10-31 DIAGNOSIS — E7800 Pure hypercholesterolemia, unspecified: Secondary | ICD-10-CM

## 2022-11-11 ENCOUNTER — Encounter: Payer: Self-pay | Admitting: Student

## 2022-11-11 ENCOUNTER — Ambulatory Visit (INDEPENDENT_AMBULATORY_CARE_PROVIDER_SITE_OTHER): Payer: PPO | Admitting: Student

## 2022-11-11 VITALS — BP 124/73 | HR 70 | Ht 74.5 in | Wt 171.0 lb

## 2022-11-11 DIAGNOSIS — E1142 Type 2 diabetes mellitus with diabetic polyneuropathy: Secondary | ICD-10-CM

## 2022-11-11 DIAGNOSIS — E1159 Type 2 diabetes mellitus with other circulatory complications: Secondary | ICD-10-CM

## 2022-11-11 DIAGNOSIS — I152 Hypertension secondary to endocrine disorders: Secondary | ICD-10-CM | POA: Diagnosis not present

## 2022-11-11 MED ORDER — POLYMYXIN B-TRIMETHOPRIM 10000-0.1 UNIT/ML-% OP SOLN: 2.0000 [drp] | OPHTHALMIC | 0 refills | Status: AC

## 2022-11-11 NOTE — Progress Notes (Signed)
    SUBJECTIVE:   CHIEF COMPLAINT / HPI:   Gregory Harrison is a 71 year old male with type 2 diabetes, hypertension, diabetic neuropathy here to meet new PCP.  He will lives at home with his wife Golden Circle. Just recently had cholecystectomy on 10/16/2022.  No complications.  He is feeling well.  Recently started using Flonase to help with cough which has helped a lot.  T2DM Current regimen: Lantus 34u daily, Metformin 1000 mg BID, Jardiance 25 mg daily He says that when he is down to 125 with a blood glucose, he feels sweaty and symptomatic from hypoglycemia. Says that the feelings of low glucose mostly happens at night, around 1 AM.  Chronic pain He is also followed by chronic pain management.  He is prescribed only t Norco 5-325 to be taken twice daily as needed for moderate pain.  Uses this sparingly, the last time he took it was last Friday.   PERTINENT  PMH / PSH: Reviewed  OBJECTIVE:   BP 124/73   Pulse 70   Ht 6' 2.5" (1.892 m)   Wt 171 lb (77.6 kg)   SpO2 99%   BMI 21.66 kg/m   General: Elderly male, no distress, pleasant CV: Regular rate and rhythm Respiratory: Normal work of breathing on room air.  Lungs clear in all fields to auscultation. Extremities: No visible rashes, bruising Neuro: Speech clear and fluent.  Very conversational.  No focal neurologic deficits. Psych: Pleasant.  Normal mood and affect.   ASSESSMENT/PLAN:   Type 2 diabetes mellitus with diabetic polyneuropathy, without long-term current use of insulin (HCC) Most recent A1c 9.4%.  Goal less than 8.5%. Can consider starting Trulicity at next visit to aid in reducing A1c while stabilizing blood glucose. Given that he feels symptomatic from a hypoglycemic standpoint around glucose of 100, told patient that he may reduce insulin to 32 units daily. Encouraged him to have a nighttime snack with protein, carb, fat.  He said he will do this. Can also consider GLP-1. Follow-up in January 2024.  A1c at that  time.  Hypertension associated with diabetes ([place]) BP at goal today.  Compliant with medications. Continue amlodipine 2.5 mg daily, losartan 25 mg daily, HCTZ 12.5 mg daily. Follow-up in 1 month.   Of note-will be cognizant of avoiding meloxicam given age and risk of CV/cardiovascular events.  Orvis Brill, [place]

## 2022-11-11 NOTE — Patient Instructions (Signed)
It was great seeing you today.  Follow-up in January 2024 so we can check your A1c and blood pressure.  I sent in eye drops for your eye. Use this as prescribed for your 1 week.  Your blood pressure looks good.  If you have any questions or concerns, please feel free to call the clinic.   Have a wonderful day,  Dr. Orvis Brill [facility] [phone]

## 2022-11-11 NOTE — Addendum Note (Signed)
Addended by: Owens Shark, Pocahontas Cohenour on: 11/11/2022 07:42 PM   Modules accepted: Level of Service

## 2022-11-11 NOTE — Assessment & Plan Note (Signed)
Most recent A1c 9.4%.  Goal less than 8.5%. Can consider starting Trulicity at next visit to aid in reducing A1c while stabilizing blood glucose. Given that he feels symptomatic from a hypoglycemic standpoint around glucose of 100, told patient that he may reduce insulin to 32 units daily. Encouraged him to have a nighttime snack with protein, carb, fat.  He said he will do this. Can also consider GLP-1. Follow-up in January 2024.  A1c at that time.

## 2022-11-11 NOTE — Assessment & Plan Note (Signed)
BP at goal today.  Compliant with medications. Continue amlodipine 2.5 mg daily, losartan 25 mg daily, HCTZ 12.5 mg daily. Follow-up in 1 month.

## 2022-12-27 ENCOUNTER — Encounter: Payer: Self-pay | Admitting: Student

## 2022-12-27 ENCOUNTER — Ambulatory Visit (INDEPENDENT_AMBULATORY_CARE_PROVIDER_SITE_OTHER): Payer: PPO | Admitting: Student

## 2022-12-27 VITALS — BP 132/74 | HR 59 | Ht 74.5 in | Wt 170.8 lb

## 2022-12-27 DIAGNOSIS — I152 Hypertension secondary to endocrine disorders: Secondary | ICD-10-CM | POA: Diagnosis not present

## 2022-12-27 DIAGNOSIS — E1159 Type 2 diabetes mellitus with other circulatory complications: Secondary | ICD-10-CM | POA: Diagnosis not present

## 2022-12-27 DIAGNOSIS — E1142 Type 2 diabetes mellitus with diabetic polyneuropathy: Secondary | ICD-10-CM | POA: Diagnosis not present

## 2022-12-27 LAB — POCT GLYCOSYLATED HEMOGLOBIN (HGB A1C): HbA1c, POC (controlled diabetic range): 9.6 % — AB (ref 0.0–7.0)

## 2022-12-27 NOTE — Progress Notes (Signed)
    SUBJECTIVE:   CHIEF COMPLAINT / HPI:   Gregory Harrison is a 72 year-old male here for follow-up of chronic conditions stated below. He does say he has been having some falls where he is tripping over things.  He denies any dizziness, presyncopal or syncopal symptoms.  He says he is "just not looking where I am going to need to pick my feet up."  I offered physical therapy, but he declined.  HTN Taking HCTZ 12.5 mg  and Amlodipine 2.5 mg daily. No headache, shortness of breath, chest pain.  T2DM Due for A1c today. CBG running "sometimes up and sometimes down." Has a bad habit of wanting something sweet before bed. Taking insuline glargine 34 units daily, Jardiance 25 mg daily, Metformin 500 mg two tablets twice daily  Denies any hypoglycemic episodes.   PERTINENT  PMH / PSH: Reviewed  OBJECTIVE:   BP 132/74   Pulse (!) 59   Ht 6' 2.5" (1.892 m)   Wt 170 lb 12.8 oz (77.5 kg)   SpO2 98%   BMI 21.64 kg/m   General: Elderly male. Alert and cooperative and appears to be in no acute distress Cardio: Normal S1 and S2, no S3 or S4. Rhythm is regular. No murmurs or rubs.   Pulm: Clear to auscultation bilaterally, no crackles, wheezing, or diminished breath sounds. Normal respiratory effort Abdomen: Bowel sounds normal. Abdomen soft and non-tender.  Extremities: No peripheral edema. Warm/ well perfused.  Strong radial pulses. Neuro: Cranial nerves grossly intact  ASSESSMENT/PLAN:   Hypertension associated with diabetes (Gregory Harrison) BP at goal today.  Denies any dizziness or weakness.  I do not think that orthostatic hypotension is reasoning for his falls, so we will continue on his current regimen of amlodipine 2.5 mg daily, losartan 25 mg daily, HCTZ 12.5 mg daily. Plan to follow-up in the next 2 months. Last BMP in November 2023.  Type 2 diabetes mellitus with diabetic polyneuropathy, without long-term current use of insulin (HCC) A1c 9.6% goal is less than 9.3% Consider Trulicity at next  visit. In the meantime, increase insulin to 38 units daily.  Also, continue metformin and Jardiance. F/u in 3 months   For the falls, I did recommend physical therapy.  [name] adamantly declined. I believe that his falls were multifactorial, although he was able to walk unassisted into the clinic today without issue. Cannot identify any MSK issues.  His blood pressure is stable.  Gregory Harrison, [place]

## 2022-12-27 NOTE — Assessment & Plan Note (Signed)
BP at goal today.  Denies any dizziness or weakness.  I do not think that orthostatic hypotension is reasoning for his falls, so we will continue on his current regimen of amlodipine 2.5 mg daily, losartan 25 mg daily, HCTZ 12.5 mg daily. Plan to follow-up in the next 2 months. Last BMP in November 2023.

## 2022-12-27 NOTE — Patient Instructions (Signed)
It was great seeing you today.  Your A1c is 9.6%. Lets increase your insulin to 38 units daily. Let me know what your sugars are.  Come back in 3 months for follow-up. Please schedule an appointment at the front desk before you leave.   If you have any questions or concerns, please feel free to call the clinic.   Have a wonderful day,  Dr. Orvis Brill [facility] [phone]

## 2022-12-27 NOTE — Assessment & Plan Note (Signed)
A1c 9.6% goal is less than 3.5% Consider Trulicity at next visit. In the meantime, increase insulin to 38 units daily.  Also, continue metformin and Jardiance. F/u in 3 months

## 2023-01-02 ENCOUNTER — Encounter: Payer: PPO | Admitting: Student in an Organized Health Care Education/Training Program

## 2023-01-07 ENCOUNTER — Ambulatory Visit
Payer: PPO | Attending: Student in an Organized Health Care Education/Training Program | Admitting: Student in an Organized Health Care Education/Training Program

## 2023-01-07 ENCOUNTER — Encounter: Payer: Self-pay | Admitting: Student in an Organized Health Care Education/Training Program

## 2023-01-07 VITALS — BP 184/86 | HR 65 | Temp 97.3°F | Ht 74.0 in | Wt 171.0 lb

## 2023-01-07 DIAGNOSIS — G894 Chronic pain syndrome: Secondary | ICD-10-CM | POA: Insufficient documentation

## 2023-01-07 DIAGNOSIS — M19012 Primary osteoarthritis, left shoulder: Secondary | ICD-10-CM | POA: Insufficient documentation

## 2023-01-07 DIAGNOSIS — M25512 Pain in left shoulder: Secondary | ICD-10-CM | POA: Insufficient documentation

## 2023-01-07 DIAGNOSIS — M47816 Spondylosis without myelopathy or radiculopathy, lumbar region: Secondary | ICD-10-CM | POA: Insufficient documentation

## 2023-01-07 DIAGNOSIS — E1142 Type 2 diabetes mellitus with diabetic polyneuropathy: Secondary | ICD-10-CM | POA: Diagnosis present

## 2023-01-07 DIAGNOSIS — G8929 Other chronic pain: Secondary | ICD-10-CM | POA: Diagnosis present

## 2023-01-07 DIAGNOSIS — G5601 Carpal tunnel syndrome, right upper limb: Secondary | ICD-10-CM | POA: Diagnosis not present

## 2023-01-07 MED ORDER — GABAPENTIN 400 MG PO CAPS: 1200.0000 mg | ORAL_CAPSULE | Freq: Every day | ORAL | 3 refills | Status: DC

## 2023-01-07 MED ORDER — HYDROCODONE-ACETAMINOPHEN 5-325 MG PO TABS: 1.0000 | ORAL_TABLET | Freq: Two times a day (BID) | ORAL | 0 refills | Status: DC | PRN

## 2023-01-07 NOTE — Progress Notes (Signed)
Nursing Pain Medication Assessment:  Safety precautions to be maintained throughout the outpatient stay will include: orient to surroundings, keep bed in low position, maintain call bell within reach at all times, provide assistance with transfer out of bed and ambulation.  Medication Inspection Compliance: Pill count conducted under aseptic conditions, in front of the patient. Neither the pills nor the bottle was removed from the patient's sight at any time. Once count was completed pills were immediately returned to the patient in their original bottle.  Medication: Hydrocodone/APAP Pill/Patch Count:  41 of 60 pills remain Pill/Patch Appearance: Markings consistent with prescribed medication Bottle Appearance: Standard pharmacy container. Clearly labeled. Filled Date: 01 / 16 / 2024 Last Medication intake:  TodaySafety precautions to be maintained throughout the outpatient stay will include: orient to surroundings, keep bed in low position, maintain call bell within reach at all times, provide assistance with transfer out of bed and ambulation.

## 2023-01-07 NOTE — Progress Notes (Signed)
PROVIDER NOTE: Information contained herein reflects review and annotations entered in association with encounter. Interpretation of such information and data should be left to medically-trained personnel. Information provided to patient can be located elsewhere in the medical record under "Patient Instructions". Document created using STT-dictation technology, any transcriptional errors that may result from process are unintentional.    Patient: Gregory Harrison  Service Category: E/M  Provider: Gillis Santa, MD  DOB: [birth year]  DOS: 01/07/2023  Specialty: Interventional Pain Management  MRN: [id number]  Setting: Ambulatory outpatient  PCP: Gregory Brill, DO  Type: Established Patient    Referring Provider: Orvis Brill, DO  Location: Office  Delivery: Face-to-face     HPI  [name], a 72 y.o. year old male, is here today because of his Chronic pain syndrome [G89.4]. [name] primary complain today is Back Pain (Left lower back) Last encounter: My last encounter with him was on 06/20/22  Pertinent problems: [name] has Type 2 diabetes mellitus with diabetic polyneuropathy, without long-term current use of insulin ([place]); Chronic left shoulder pain; Carpal tunnel syndrome of right wrist; Trigger finger of right thumb; Lumbar facet arthropathy; Lumbar degenerative disc disease; Bilateral primary osteoarthritis of knee; Primary osteoarthritis of left shoulder; and Trigger finger, right little finger on their pertinent problem list. Pain Assessment: Severity of Chronic pain is reported as a 9 /10. Location: Back Left, Lower/denies. Onset: More than a month ago. Quality: Constant. Timing: Constant. Modifying factor(s): meds. Vitals:  height is '6\' 2"'$  (1.88 m) and weight is 171 lb (77.6 kg). His temporal temperature is 97.3 F (36.3 C) (abnormal). His blood pressure is 184/86 (abnormal) and his pulse is 65. His oxygen saturation is 100%.   Reason for encounter: medication management   Gregory Harrison  presents today for medication management.  No change in his medical history other than he is experiencing more falls. S/p cholecystectomy 10/16/22, has recovered from this without any issues   Pharmacotherapy Assessment  Analgesic: Hydrocodone 5 mg twice daily as needed, quantity 60/month MME equals 10    Monitoring: [place] PMP: PDMP reviewed during this encounter.       Pharmacotherapy: No side-effects or adverse reactions reported. Compliance: No problems identified. Effectiveness: Clinically acceptable.  Gregory Colt, RN  01/07/2023  9:52 AM  Sign when Signing Visit Nursing Pain Medication Assessment:  Safety precautions to be maintained throughout the outpatient stay will include: orient to surroundings, keep bed in low position, maintain call bell within reach at all times, provide assistance with transfer out of bed and ambulation.  Medication Inspection Compliance: Pill count conducted under aseptic conditions, in front of the patient. Neither the pills nor the bottle was removed from the patient's sight at any time. Once count was completed pills were immediately returned to the patient in their original bottle.  Medication: Hydrocodone/APAP Pill/Patch Count:  41 of 60 pills remain Pill/Patch Appearance: Markings consistent with prescribed medication Bottle Appearance: Standard pharmacy container. Clearly labeled. Filled Date: 01 / 16 / 2024 Last Medication intake:  TodaySafety precautions to be maintained throughout the outpatient stay will include: orient to surroundings, keep bed in low position, maintain call bell within reach at all times, provide assistance with transfer out of bed and ambulation.      UDS:  Summary  Date Value Ref Range Status  03/14/2022 Note  Final    Comment:    ==================================================================== [facility] 13 (MW) ==================================================================== Test  Result       Flag       Units  Drug Absent but Declared for Prescription Verification   Hydrocodone                    Not Detected UNEXPECTED ng/mg creat ==================================================================== Test                      Result    Flag   Units      Ref Range   Creatinine              45               mg/dL      >=20 ==================================================================== Declared Medications:  The flagging and interpretation on this report are based on the  following declared medications.  Unexpected results may arise from  inaccuracies in the declared medications.   **Note: The testing scope of this panel includes these medications:   Hydrocodone (Norco)   **Note: The testing scope of this panel does not include the  following reported medications:   Acetaminophen (Norco)  Amlodipine (Norvasc)  Atorvastatin (Lipitor)  Gabapentin (Neurontin)  Hydrochlorothiazide (Hydrodiuril)  Insulin (Basaglar)  Losartan (Cozaar)  Meloxicam (Mobic)  Metformin (Glucophage)  Sitagliptin (Januvia)  Topical ==================================================================== For clinical consultation, please call [phone]. ====================================================================      ROS  Constitutional: Denies any fever or chills Gastrointestinal: No reported hemesis, hematochezia, vomiting, or acute GI distress Musculoskeletal:  Left knee pain Neurological: No reported episodes of acute onset apraxia, aphasia, dysarthria, agnosia, amnesia, paralysis, loss of coordination, or loss of consciousness  Medication Review  Alcohol Swabs, Basaglar KwikPen, Emollient, HYDROcodone-acetaminophen, Insulin Pen Needle, ONE TOUCH DELICA LANCING DEV, OneTouch Delica Lancets Fine, OneTouch Verio, OneTouch Verio Reflect, amLODipine, atorvastatin, blood glucose meter kit and supplies, empagliflozin, fluticasone, gabapentin, glucose blood,  hydrochlorothiazide, losartan, meloxicam, metFORMIN, ondansetron, and prochlorperazine  History Review  Allergy: [name] has No Known Allergies. Drug: [name]  reports that he does not currently use drugs after having used the following drugs: Marijuana. Alcohol:  reports that he does not currently use alcohol. Tobacco:  reports that he quit smoking about 35 years ago. His smoking use included cigarettes. He has a 40.00 pack-year smoking history. He has never used smokeless tobacco. Social: Mr. Nabor  reports that he quit smoking about 35 years ago. His smoking use included cigarettes. He has a 40.00 pack-year smoking history. He has never used smokeless tobacco. He reports that he does not currently use alcohol. He reports that he does not currently use drugs after having used the following drugs: Marijuana. Medical:  has a past medical history of Arthritis, Cancer ([place]) (2023), Diabetes ([place]) (2000), Diabetic neuropathy ([place]), GERD (gastroesophageal reflux disease), HTN (hypertension) (2010), Hypercholesterolemia, Solitary pulmonary nodule on lung CT (08/19/2022), Vision problem, and Weight loss (07/16/2019). Surgical: Mr. Dettmann  has a past surgical history that includes Hand surgery (1980s); Nose surgery (1979); Eye surgery (Left, 1990s); Tonsillectomy; and Cholecystectomy (N/A, 10/16/2022). Family: family history includes ALS in his brother; Diabetes in his paternal grandfather; Heart attack in his father; Heart murmur in his father; Other in his mother.  Laboratory Chemistry Profile   Renal Lab Results  Component Value Date   BUN 10 10/08/2022   CREATININE 0.85 10/08/2022   BCR 21 08/08/2022   GFRAA 88 10/19/2020   GFRNONAA >60 10/08/2022    Hepatic Lab Results  Component Value Date   AST 16 08/28/2022  ALT 21 08/28/2022   ALBUMIN 4.0 08/28/2022   ALKPHOS 47 08/28/2022    Electrolytes Lab Results  Component Value Date   NA 140 10/08/2022   K 3.5 10/08/2022   CL 102  10/08/2022   CALCIUM 9.0 10/08/2022    Bone No results found for: "VD25OH", "VD125OH2TOT", "DV7616WV3", "XT0626RS8", "25OHVITD1", "25OHVITD2", "25OHVITD3", "TESTOFREE", "TESTOSTERONE"  Inflammation (CRP: Acute Phase) (ESR: Chronic Phase) Lab Results  Component Value Date   ESRSEDRATE 20 08/08/2022         Note: Above Lab results reviewed.  Recent Imaging Review  MR ABDOMEN MRCP W WO CONTAST CLINICAL DATA:  Further evaluation of abnormality in gallbladder seen on prior examination.  EXAM: MRI ABDOMEN WITHOUT AND WITH CONTRAST (INCLUDING MRCP)  TECHNIQUE: Multiplanar multisequence MR imaging of the abdomen was performed both before and after the administration of intravenous contrast. Heavily T2-weighted images of the biliary and pancreatic ducts were obtained, and three-dimensional MRCP images were rendered by post processing.  CONTRAST:  13m MULTIHANCE GADOBENATE DIMEGLUMINE 529 MG/ML IV SOLN  COMPARISON:  Right upper quadrant ultrasound August 21, 2022 and PET-CT September 05, 2022.  FINDINGS: Lower chest: No acute abnormality.  Hepatobiliary: No significant hepatic steatosis. No suspicious hepatic lesion  Large gallstone in the proximal gallbladder measures 3.8 x 2.8 cm. Distal this in the gallbladder fundus is a crescentic focus of homogeneous intrinsically T1 hyperintense material measuring 4.1 x 2.4 cm on image 52/24 which does not demonstrate postcontrast enhancement on subtraction imaging.  No biliary ductal dilation.  Pancreas: Mild senescent atrophy of the pancreatic parenchyma. No pancreatic ductal dilation. No cystic or solid hyperenhancing pancreatic lesion identified.  Spleen:  No splenomegaly or focal splenic lesion.  Adrenals/Urinary Tract: Bilateral adrenal glands appear normal. No hydronephrosis. Fluid signal bilateral renal lesions measure up to 11 mm in the right upper pole, without suspicious postcontrast enhancement consistent with  benign cysts which require no independent imaging follow-up.  Stomach/Bowel: Visualized portions within the abdomen are unremarkable.  Vascular/Lymphatic: Normal caliber abdominal aorta. The portal, splenic and superior mesenteric veins are patent. No pathologically enlarged abdominal lymph nodes.  Other:  No significant abdominal free fluid.  Musculoskeletal: No suspicious bone lesions identified.  IMPRESSION: 1. Large gallstone in the proximal gallbladder with a crescentic focus of homogeneous intrinsically T1 hyperintense material in the gallbladder body/fundus which does not demonstrate postcontrast enhancement on subtraction imaging and is favored to reflect impacted biliary sludge. 2. No suspicious biliary or hepatic lesion.  Electronically Signed   By: JDahlia BailiffM.D.   On: 09/16/2022 12:50 Note: Reviewed        Physical Exam  General appearance: Well nourished, well developed, and well hydrated. In no apparent acute distress Mental status: Alert, oriented x 3 (person, place, & time)       Respiratory: No evidence of acute respiratory distress Eyes: PERLA Vitals: BP (!) 184/86 (BP Location: Right Arm, Patient Position: Sitting, Cuff Size: Normal) Comment: instructed pt to follow up with PCP; Dr LHolley Raringnotified  Pulse 65   Temp (!) 97.3 F (36.3 C) (Temporal)   Ht '6\' 2"'$  (1.88 m)   Wt 171 lb (77.6 kg)   SpO2 100%   BMI 21.96 kg/m  BMI: Estimated body mass index is 21.96 kg/m as calculated from the following:   Height as of this encounter: '6\' 2"'$  (1.88 m).   Weight as of this encounter: 171 lb (77.6 kg). Ideal: Ideal body weight: 82.2 kg (181 lb 3.5 oz)   Assessment   Diagnosis  1. Chronic pain syndrome   2. Chronic left shoulder pain   3. Carpal tunnel syndrome of right wrist   4. Primary osteoarthritis of left shoulder   5. Diabetic polyneuropathy associated with type 2 diabetes mellitus ([place])   6. Type 2 diabetes mellitus with diabetic polyneuropathy,  without long-term current use of insulin ([facility])   7. Lumbar facet arthropathy         Plan of Care   1. Chronic pain syndrome - HYDROcodone-acetaminophen (NORCO/VICODIN) 5-325 MG tablet; Take 1 tablet by mouth 2 (two) times daily as needed for moderate pain. For chronic pain syndrome  Dispense: 60 tablet; Refill: 0 - HYDROcodone-acetaminophen (NORCO/VICODIN) 5-325 MG tablet; Take 1 tablet by mouth 2 (two) times daily as needed for moderate pain. For chronic pain syndrome  Dispense: 60 tablet; Refill: 0 - HYDROcodone-acetaminophen (NORCO/VICODIN) 5-325 MG tablet; Take 1 tablet by mouth 2 (two) times daily as needed for moderate pain. For chronic pain syndrome  Dispense: 60 tablet; Refill: 0  2. Chronic left shoulder pain - HYDROcodone-acetaminophen (NORCO/VICODIN) 5-325 MG tablet; Take 1 tablet by mouth 2 (two) times daily as needed for moderate pain. For chronic pain syndrome  Dispense: 60 tablet; Refill: 0 - HYDROcodone-acetaminophen (NORCO/VICODIN) 5-325 MG tablet; Take 1 tablet by mouth 2 (two) times daily as needed for moderate pain. For chronic pain syndrome  Dispense: 60 tablet; Refill: 0 - HYDROcodone-acetaminophen (NORCO/VICODIN) 5-325 MG tablet; Take 1 tablet by mouth 2 (two) times daily as needed for moderate pain. For chronic pain syndrome  Dispense: 60 tablet; Refill: 0  3. Carpal tunnel syndrome of right wrist - HYDROcodone-acetaminophen (NORCO/VICODIN) 5-325 MG tablet; Take 1 tablet by mouth 2 (two) times daily as needed for moderate pain. For chronic pain syndrome  Dispense: 60 tablet; Refill: 0 - HYDROcodone-acetaminophen (NORCO/VICODIN) 5-325 MG tablet; Take 1 tablet by mouth 2 (two) times daily as needed for moderate pain. For chronic pain syndrome  Dispense: 60 tablet; Refill: 0 - HYDROcodone-acetaminophen (NORCO/VICODIN) 5-325 MG tablet; Take 1 tablet by mouth 2 (two) times daily as needed for moderate pain. For chronic pain syndrome  Dispense: 60 tablet; Refill: 0  4.  Primary osteoarthritis of left shoulder - HYDROcodone-acetaminophen (NORCO/VICODIN) 5-325 MG tablet; Take 1 tablet by mouth 2 (two) times daily as needed for moderate pain. For chronic pain syndrome  Dispense: 60 tablet; Refill: 0 - HYDROcodone-acetaminophen (NORCO/VICODIN) 5-325 MG tablet; Take 1 tablet by mouth 2 (two) times daily as needed for moderate pain. For chronic pain syndrome  Dispense: 60 tablet; Refill: 0 - HYDROcodone-acetaminophen (NORCO/VICODIN) 5-325 MG tablet; Take 1 tablet by mouth 2 (two) times daily as needed for moderate pain. For chronic pain syndrome  Dispense: 60 tablet; Refill: 0  5. Diabetic polyneuropathy associated with type 2 diabetes mellitus ([facility]) - HYDROcodone-acetaminophen (NORCO/VICODIN) 5-325 MG tablet; Take 1 tablet by mouth 2 (two) times daily as needed for moderate pain. For chronic pain syndrome  Dispense: 60 tablet; Refill: 0 - HYDROcodone-acetaminophen (NORCO/VICODIN) 5-325 MG tablet; Take 1 tablet by mouth 2 (two) times daily as needed for moderate pain. For chronic pain syndrome  Dispense: 60 tablet; Refill: 0 - HYDROcodone-acetaminophen (NORCO/VICODIN) 5-325 MG tablet; Take 1 tablet by mouth 2 (two) times daily as needed for moderate pain. For chronic pain syndrome  Dispense: 60 tablet; Refill: 0  6. Type 2 diabetes mellitus with diabetic polyneuropathy, without long-term current use of insulin ([facility]) - gabapentin (NEURONTIN) 400 MG capsule; Take 3 capsules (1,200 mg total) by mouth at bedtime.  Dispense: 270 capsule;  Refill: 3  7. Lumbar facet arthropathy     Pharmacotherapy (Medications Ordered): Meds ordered this encounter  Medications   HYDROcodone-acetaminophen (NORCO/VICODIN) 5-325 MG tablet    Sig: Take 1 tablet by mouth 2 (two) times daily as needed for moderate pain. For chronic pain syndrome    Dispense:  60 tablet    Refill:  0   HYDROcodone-acetaminophen (NORCO/VICODIN) 5-325 MG tablet    Sig: Take 1 tablet by mouth 2 (two) times daily  as needed for moderate pain. For chronic pain syndrome    Dispense:  60 tablet    Refill:  0   HYDROcodone-acetaminophen (NORCO/VICODIN) 5-325 MG tablet    Sig: Take 1 tablet by mouth 2 (two) times daily as needed for moderate pain. For chronic pain syndrome    Dispense:  60 tablet    Refill:  0   gabapentin (NEURONTIN) 400 MG capsule    Sig: Take 3 capsules (1,200 mg total) by mouth at bedtime.    Dispense:  270 capsule    Refill:  3   Orders:  No orders of the defined types were placed in this encounter.  Follow-up plan:   Return in about 3 months (around 04/08/2023) for Medication Management, in person.    Recent Visits Date Type Provider Dept  10/10/22 Office Visit Gregory Santa, MD [facility]  Showing recent visits within past 90 days and meeting all other requirements Today's Visits Date Type Provider Dept  01/07/23 Office Visit Gregory Santa, MD [facility]  Showing today's visits and meeting all other requirements Future Appointments Date Type Provider Dept  03/27/23 Appointment Gregory Santa, MD [facility]  Showing future appointments within next 90 days and meeting all other requirements  I discussed the assessment and treatment plan with the patient. The patient was provided an opportunity to ask questions and all were answered. The patient agreed with the plan and demonstrated an understanding of the instructions.  Patient advised to call back or seek an in-person evaluation if the symptoms or condition worsens.  Duration of encounter: 10mnutes.  Note by: BGillis Santa MD Date: 01/07/2023; Time: 10:21 AM

## 2023-01-08 IMAGING — CR DG SI JOINTS 3+V
3 series · 3 of 3 positions shown · non-contrast
Comparison: None

CLINICAL DATA: Pain

EXAM:
BILATERAL SACROILIAC JOINTS - 3+ VIEW

[si joints ap]
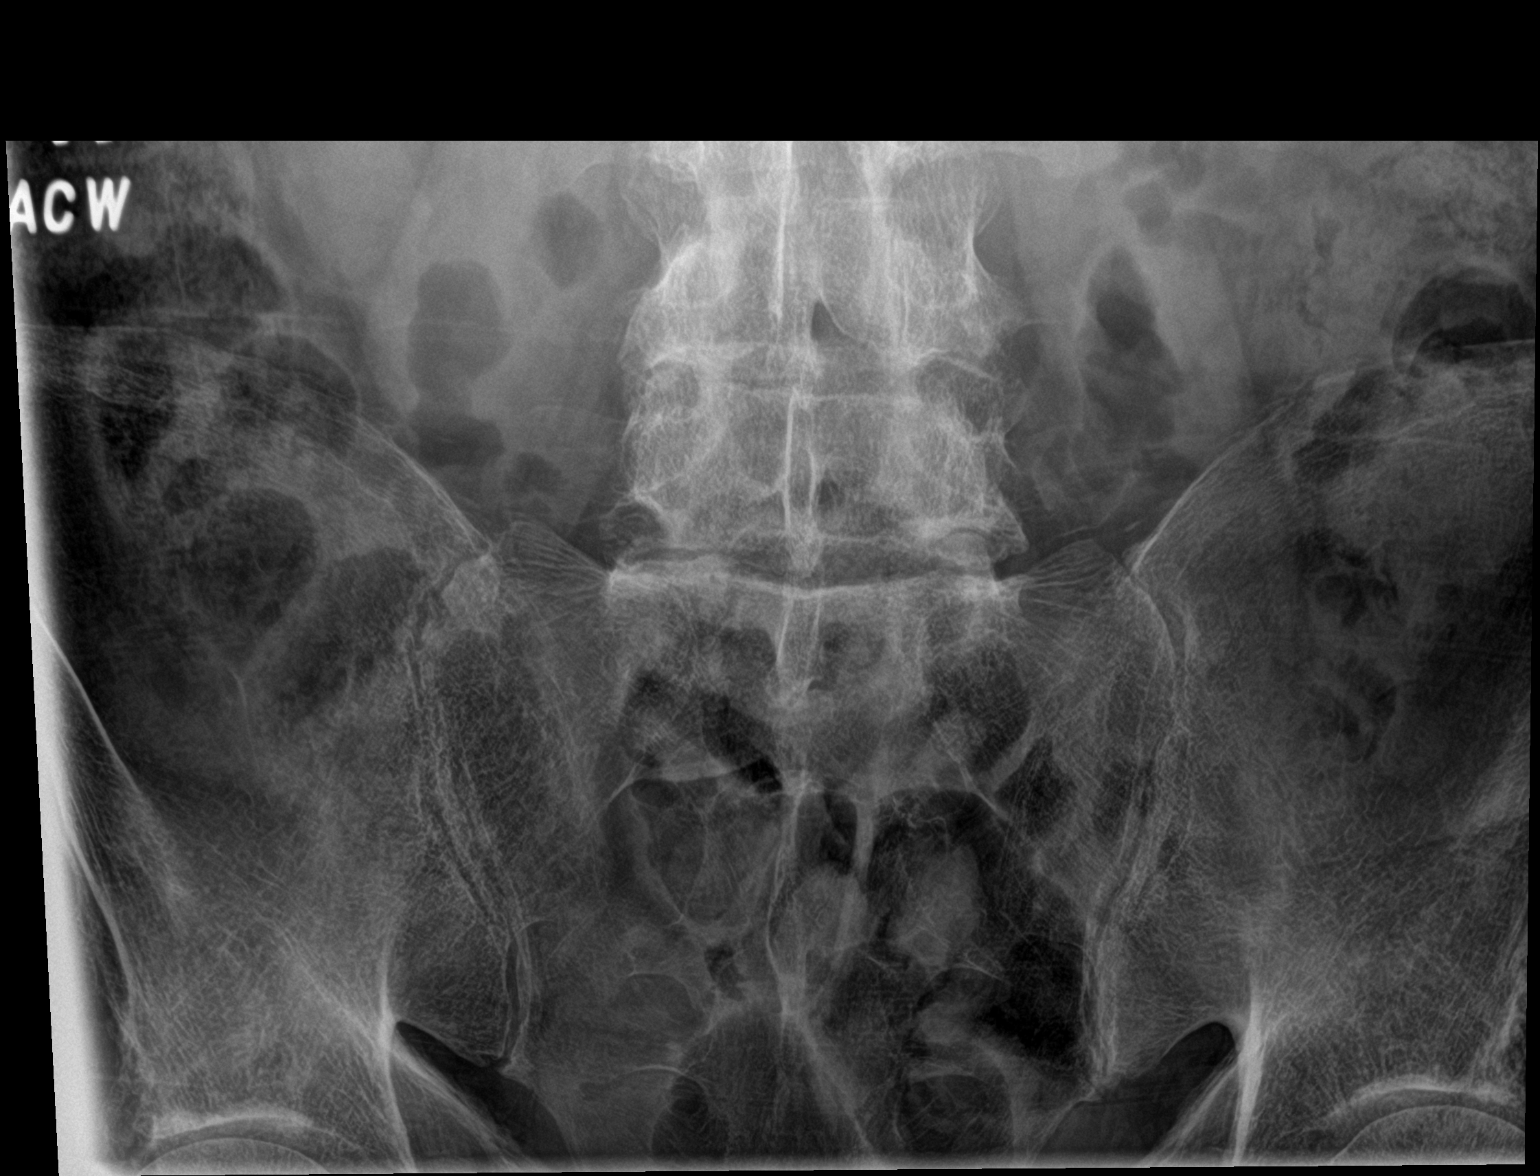

[si joints obl (1 of 2)]
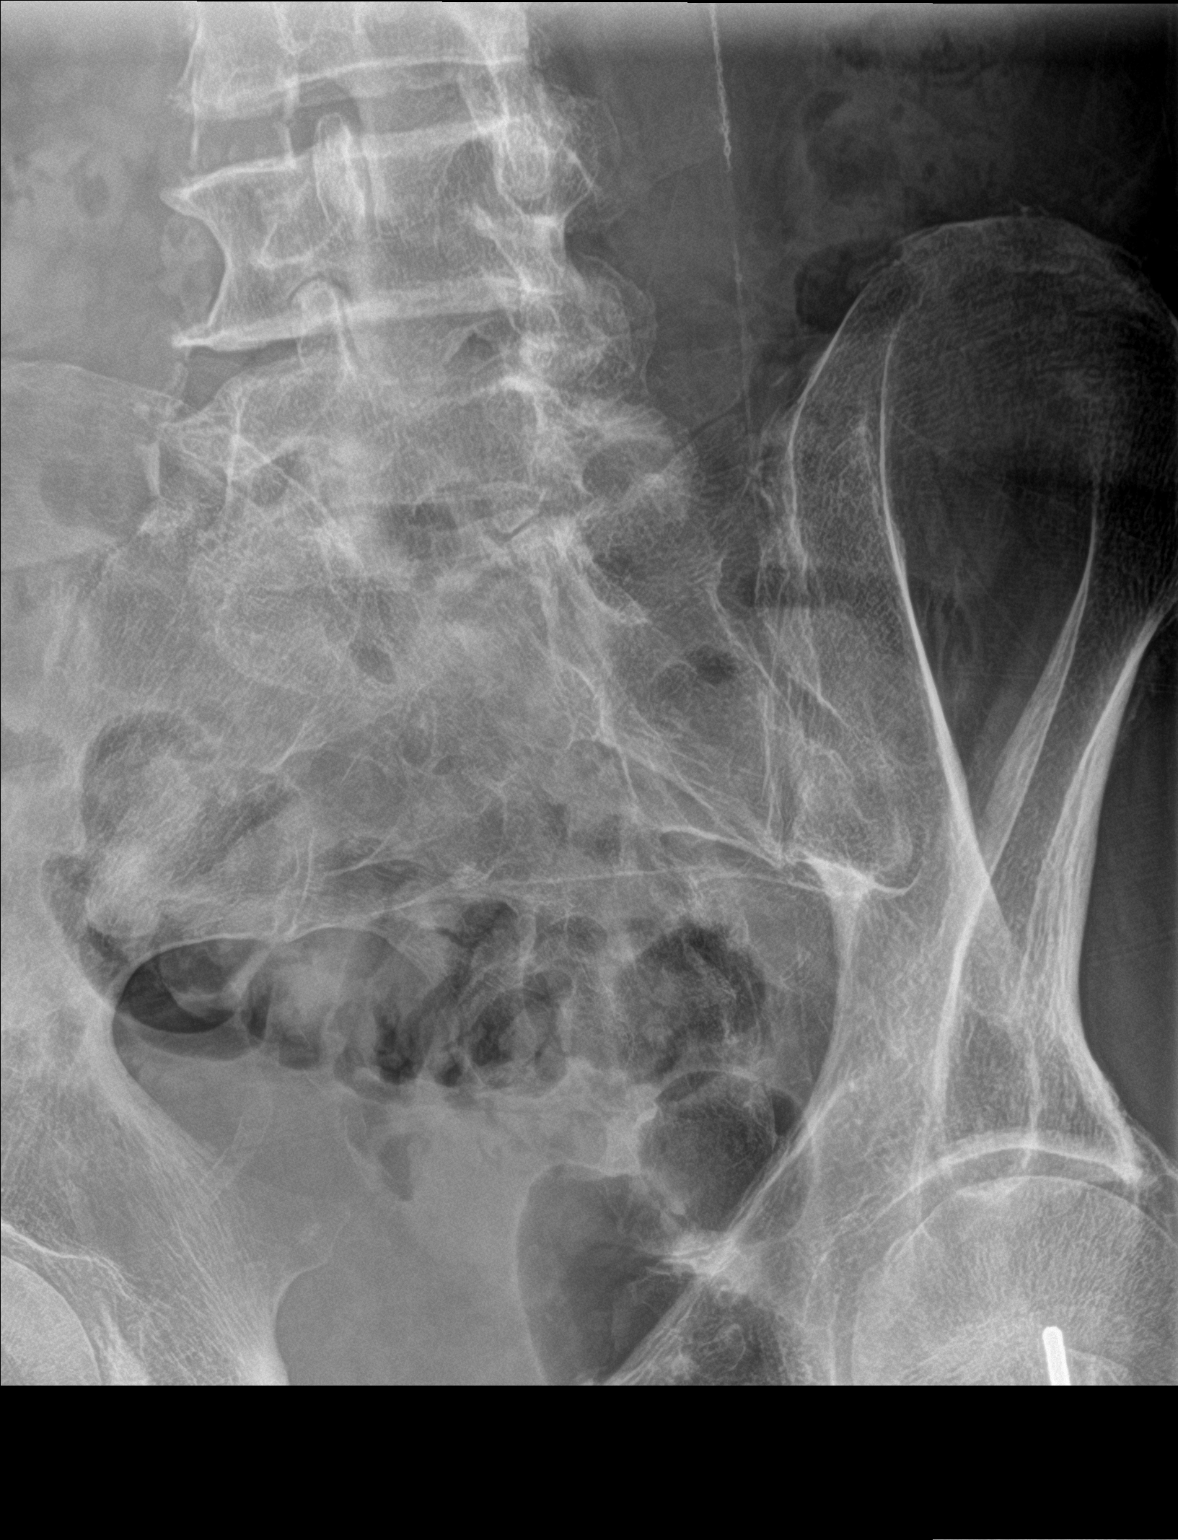

[si joints obl (2 of 2)]
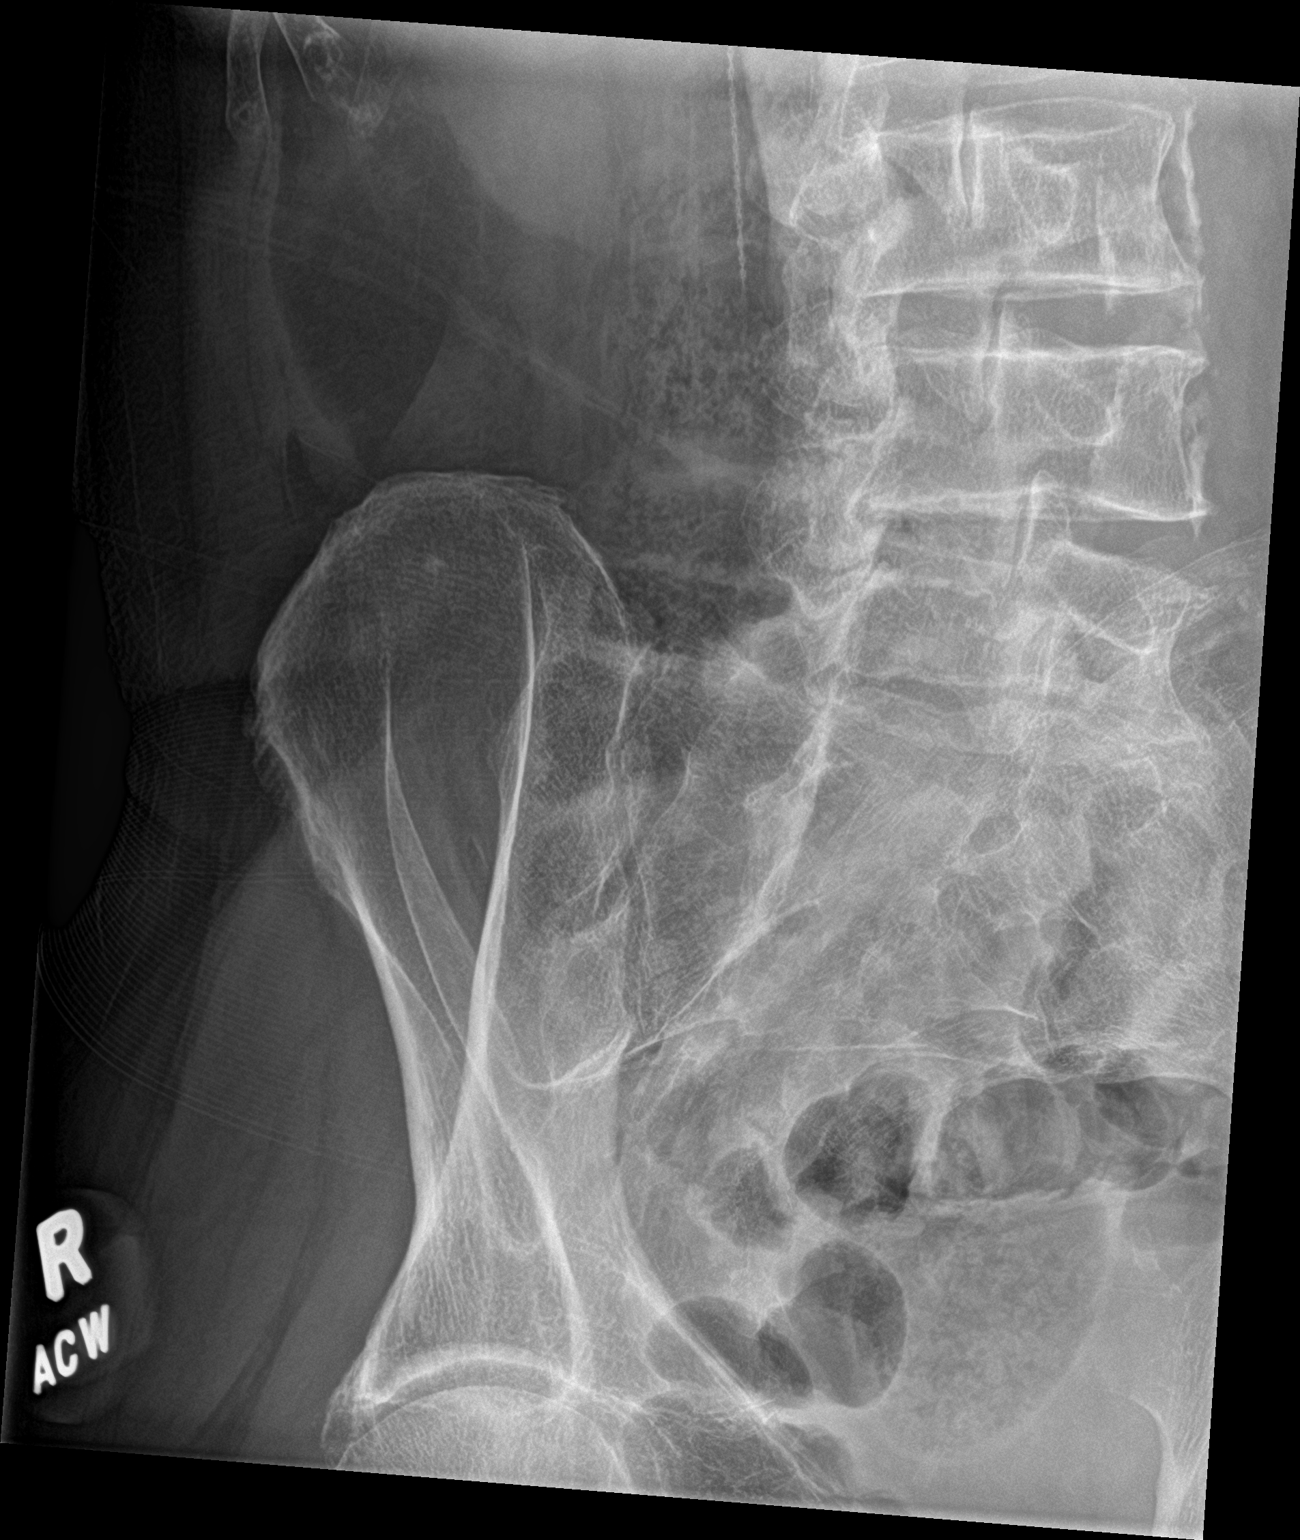

[3 of 3 positions shown; findings below may reference images not displayed]

FINDINGS: SI joints are symmetrical. There is no abnormal sclerosis adjacent
to the SI joints. Degenerative changes are noted in the visualized
lower lumbar spine.
IMPRESSION: No radiographic abnormality is seen in the SI joints. Lumbar
spondylosis.

## 2023-01-08 IMAGING — CR DG HIP (WITH OR WITHOUT PELVIS) 2-3V*L*
3 series · 3 of 3 positions shown · non-contrast
Comparison: None.

CLINICAL DATA: Left hip pain

EXAM:
DG HIP (WITH OR WITHOUT PELVIS) 2-3V LEFT

[hip ap]
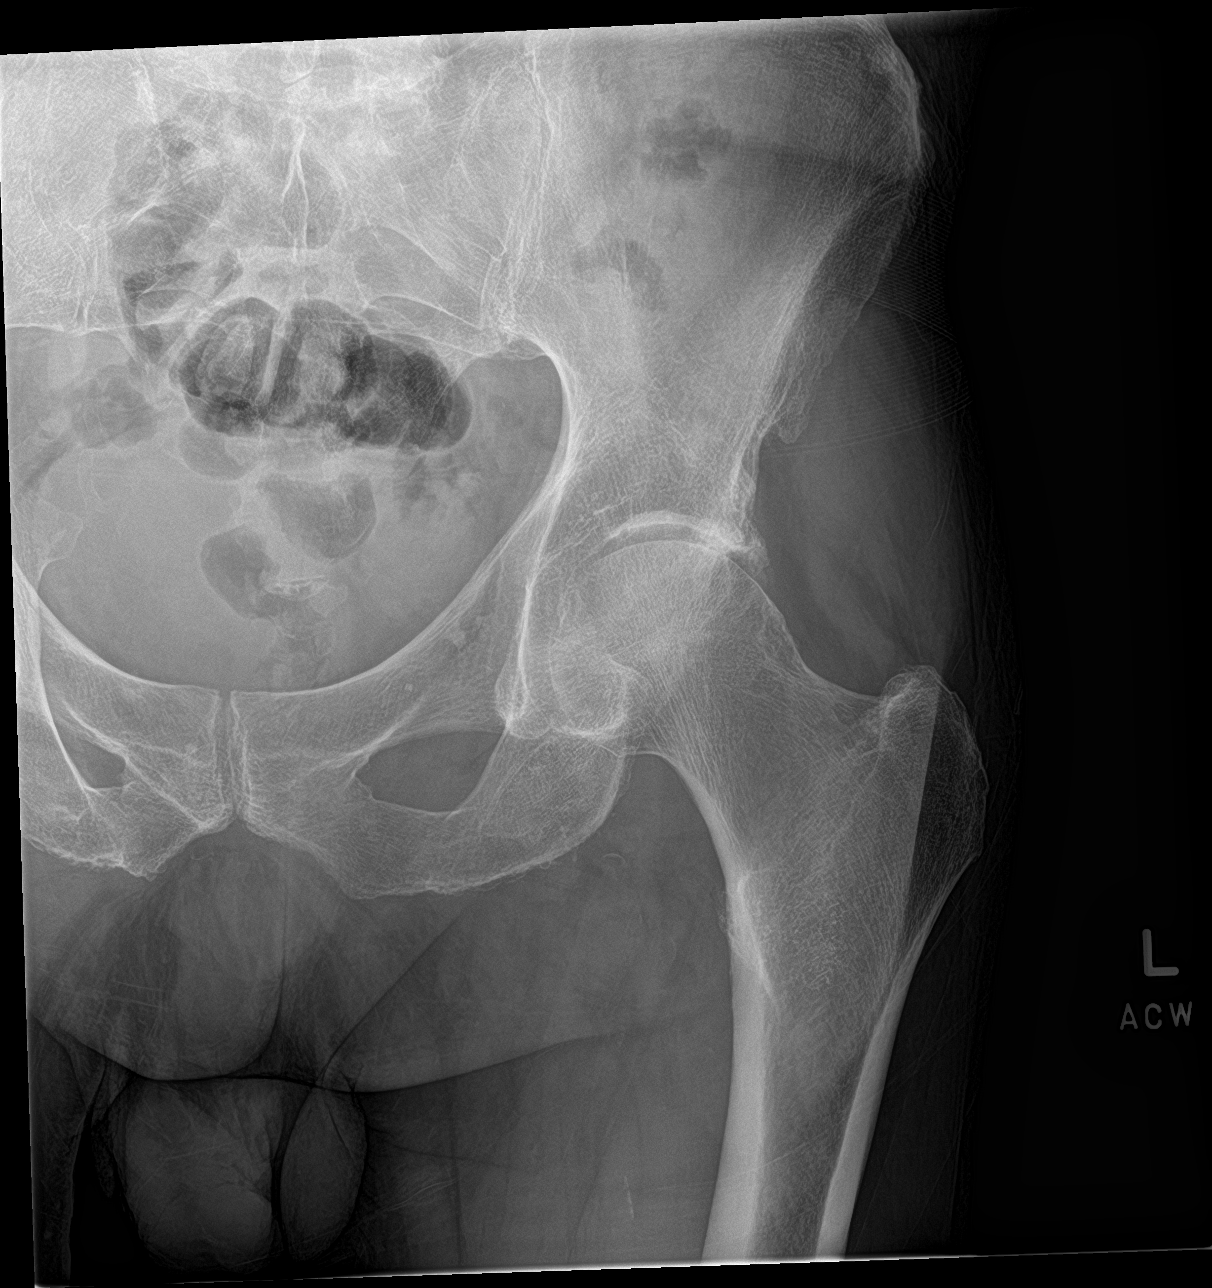

[hip lat]
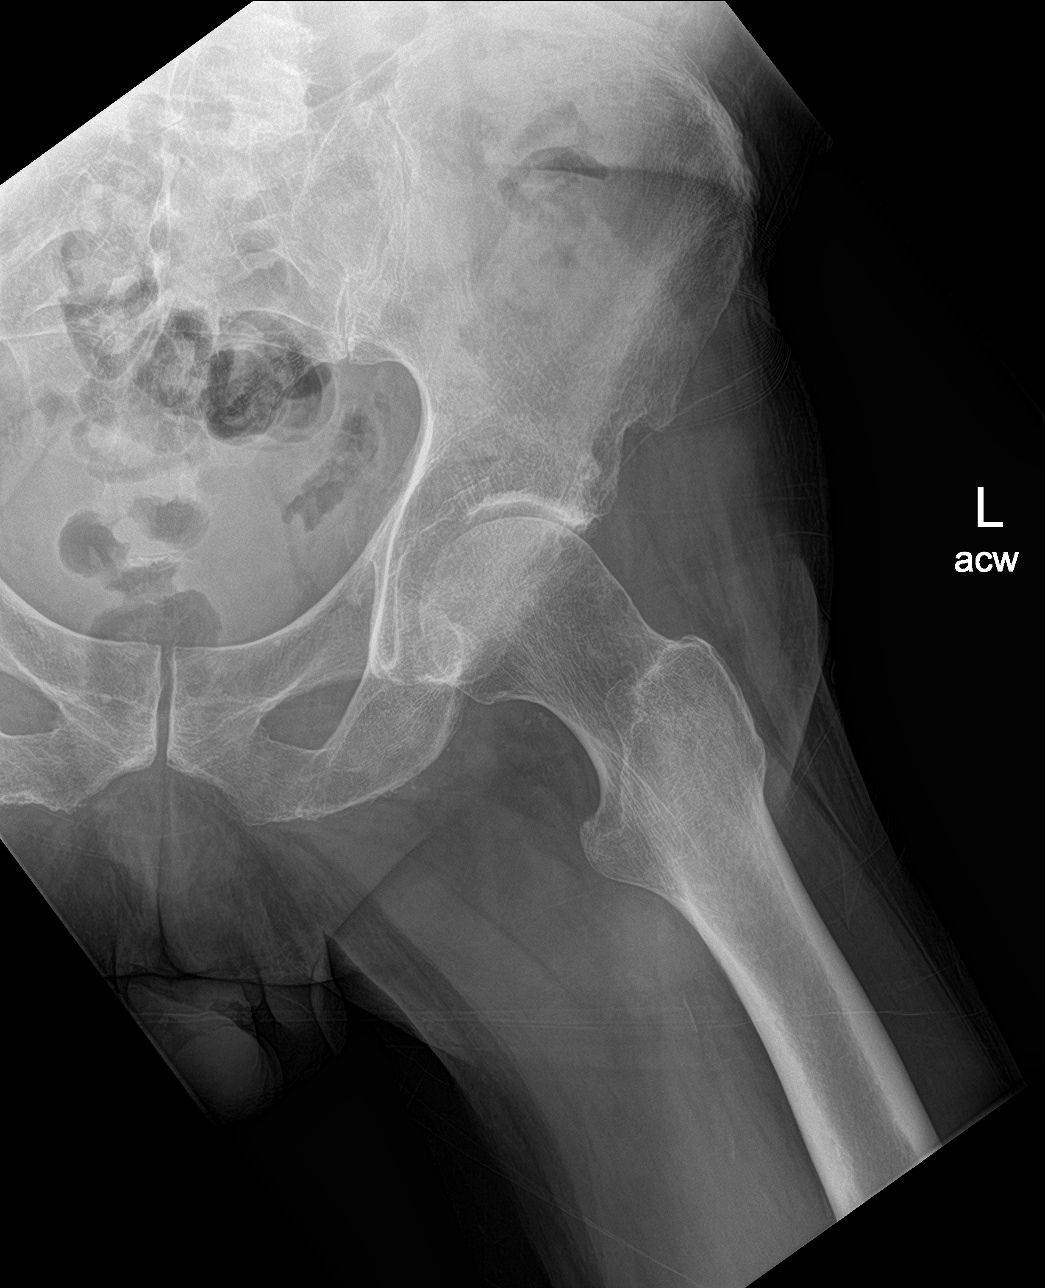

[pelvis ap]
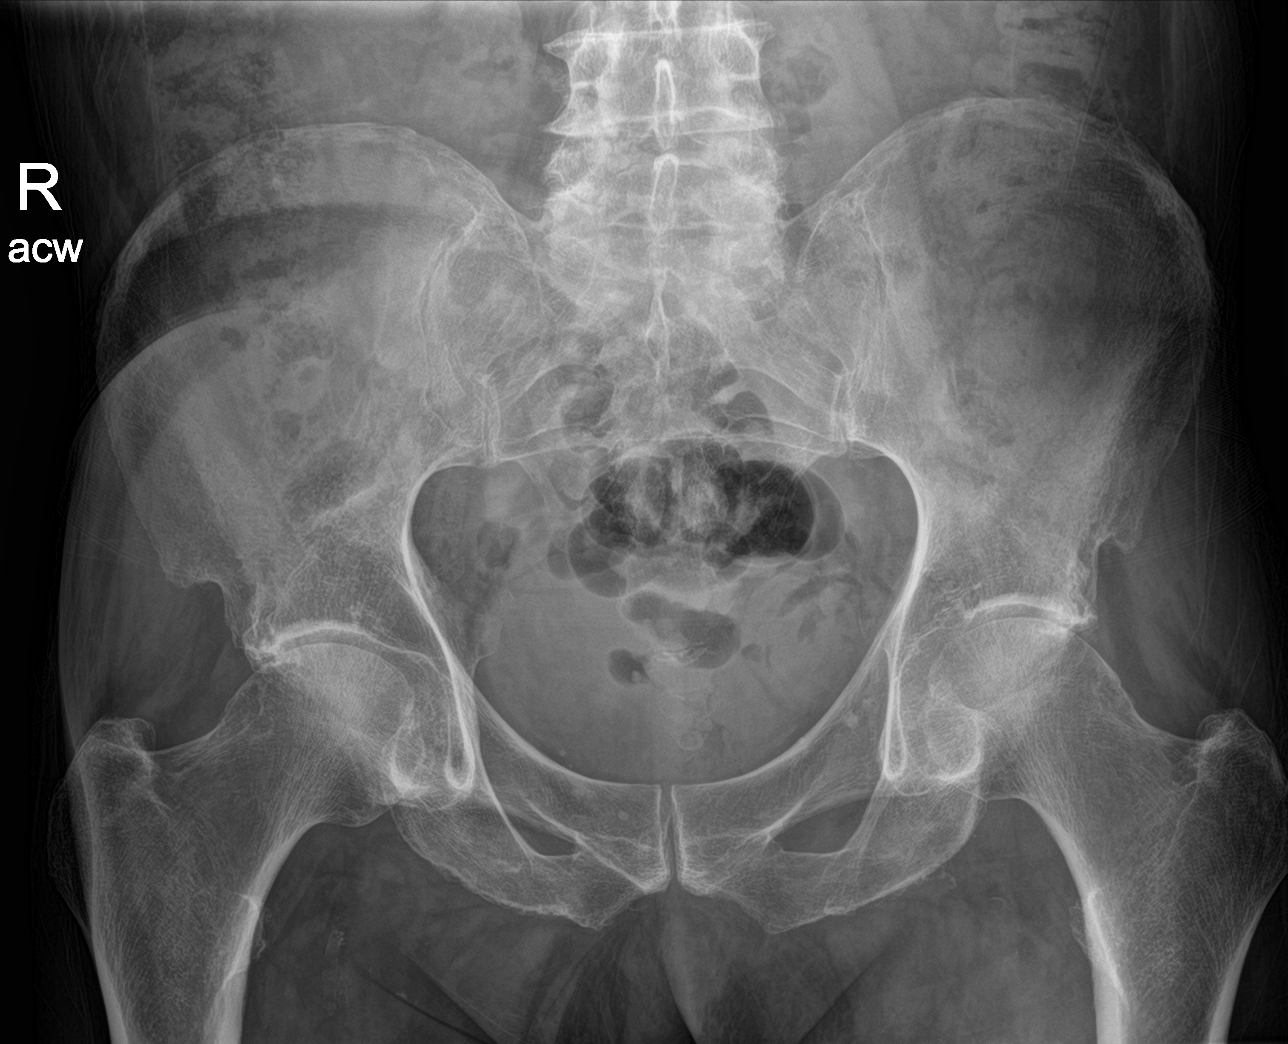

[3 of 3 positions shown; findings below may reference images not displayed]

FINDINGS: No recent fracture or dislocation is seen. Degenerative changes are
noted with joint space narrowing and bony spurs in the lateral
aspect. There are no focal lytic lesions. Degenerative changes are
also noted in the lateral right hip.
IMPRESSION: No recent fracture or dislocation is seen. Degenerative changes are
noted with joint space narrowing and bony spurs in the lateral
aspect of left hip.

## 2023-01-15 ENCOUNTER — Telehealth: Payer: Self-pay | Admitting: Student

## 2023-01-15 NOTE — Telephone Encounter (Signed)
Left message for patient to call back and schedule Medicare Annual Wellness Visit (AWV).   Please offer to do virtually or by telephone.  Left office number and my jabber [phone].  Last AWV:01/07/2020   Please schedule at anytime with Nurse Health Advisor.

## 2023-02-09 ENCOUNTER — Other Ambulatory Visit: Payer: Self-pay | Admitting: Student

## 2023-02-09 DIAGNOSIS — E7800 Pure hypercholesterolemia, unspecified: Secondary | ICD-10-CM

## 2023-03-27 ENCOUNTER — Ambulatory Visit
Payer: PPO | Attending: Student in an Organized Health Care Education/Training Program | Admitting: Student in an Organized Health Care Education/Training Program

## 2023-03-27 ENCOUNTER — Encounter: Payer: Self-pay | Admitting: Student in an Organized Health Care Education/Training Program

## 2023-03-27 VITALS — BP 208/84 | HR 56 | Temp 97.2°F | Resp 18 | Ht 74.5 in | Wt 178.0 lb

## 2023-03-27 DIAGNOSIS — G8929 Other chronic pain: Secondary | ICD-10-CM | POA: Diagnosis present

## 2023-03-27 DIAGNOSIS — E1142 Type 2 diabetes mellitus with diabetic polyneuropathy: Secondary | ICD-10-CM | POA: Insufficient documentation

## 2023-03-27 DIAGNOSIS — G894 Chronic pain syndrome: Secondary | ICD-10-CM | POA: Diagnosis not present

## 2023-03-27 DIAGNOSIS — M19012 Primary osteoarthritis, left shoulder: Secondary | ICD-10-CM | POA: Insufficient documentation

## 2023-03-27 DIAGNOSIS — G5601 Carpal tunnel syndrome, right upper limb: Secondary | ICD-10-CM | POA: Diagnosis not present

## 2023-03-27 DIAGNOSIS — M47816 Spondylosis without myelopathy or radiculopathy, lumbar region: Secondary | ICD-10-CM | POA: Insufficient documentation

## 2023-03-27 DIAGNOSIS — M25512 Pain in left shoulder: Secondary | ICD-10-CM | POA: Diagnosis not present

## 2023-03-27 MED ORDER — HYDROCODONE-ACETAMINOPHEN 5-325 MG PO TABS: 1.0000 | ORAL_TABLET | Freq: Every day | ORAL | 0 refills | Status: DC | PRN

## 2023-03-27 NOTE — Progress Notes (Signed)
PROVIDER NOTE: Information contained herein reflects review and annotations entered in association with encounter. Interpretation of such information and data should be left to medically-trained personnel. Information provided to patient can be located elsewhere in the medical record under "Patient Instructions". Document created using STT-dictation technology, any transcriptional errors that may result from process are unintentional.    Patient: Gregory Harrison  Service Category: E/M  Provider: Edward Jolly, MD  DOB: 27-Jan-1951  DOS: 03/27/2023  Specialty: Interventional Pain Management  MRN: [id number]  Setting: Ambulatory outpatient  PCP: Gregory Dash, DO  Type: Established Patient    Referring Provider: Darral Dash, DO  Location: Office  Delivery: Face-to-face     HPI  [name], a 72 y.o. year old male, is here today because of his Chronic pain syndrome [G89.4]. [name] primary complain today is Back Pain (low) Last encounter: My last encounter with him was on 06/20/22  Pertinent problems: [name] has Type 2 diabetes mellitus with diabetic polyneuropathy, without long-term current use of insulin; Chronic left shoulder pain; Carpal tunnel syndrome of right wrist; Trigger finger of right thumb; Lumbar facet arthropathy; Lumbar degenerative disc disease; Bilateral primary osteoarthritis of knee; Primary osteoarthritis of left shoulder; and Trigger finger, right little finger on their pertinent problem list. Pain Assessment: Severity of Chronic pain is reported as a 8 /10. Location: Back Lower/legs and knees. Onset: More than a month ago. Quality: Burning, Sharp. Timing: Intermittent. Modifying factor(s): medications, activity. Vitals:  height is 6' 2.5" (1.892 m) and weight is 178 lb (80.7 kg). His temporal temperature is 97.2 F (36.2 C) (abnormal). His blood pressure is 208/84 (abnormal) and his pulse is 56 (abnormal). His respiration is 18 and oxygen saturation is 99%.   Reason  for encounter: medication management   Gregory Harrison presents today for medication management.  No change in his medical history other than he is experiencing more falls. Discussed decreasing his hydrocodone to 5 mg daily as needed to reflect what he is actually taking.   Pharmacotherapy Assessment  Analgesic: Hydrocodone 5 mg twice daily as needed, quantity 60/month MME equals 10    Monitoring:  PMP: PDMP reviewed during this encounter.       Pharmacotherapy: No side-effects or adverse reactions reported. Compliance: No problems identified. Effectiveness: Clinically acceptable.  Gregory Barbara, RN  03/27/2023  9:53 AM  Signed Nursing Pain Medication Assessment:  Safety precautions to be maintained throughout the outpatient stay will include: orient to surroundings, keep bed in low position, maintain call bell within reach at all times, provide assistance with transfer out of bed and ambulation.  Medication Inspection Compliance: Pill count conducted under aseptic conditions, in front of the patient. Neither the pills nor the bottle was removed from the patient's sight at any time. Once count was completed pills were immediately returned to the patient in their original bottle.  Medication: Hydrocodone/APAP Pill/Patch Count:  15 of 60 pills remain Pill/Patch Appearance: Markings consistent with prescribed medication Bottle Appearance: Standard pharmacy container. Clearly labeled. Filled Date: 02 / 20 / 2024 Last Medication intake:  Today     UDS:  Summary  Date Value Ref Range Status  03/14/2022 Note  Final    Comment:    ==================================================================== [employer] Select 13 (MW) ==================================================================== Test                             Result       Flag  Units  Drug Absent but Declared for Prescription Verification   Hydrocodone                    Not Detected UNEXPECTED ng/mg  creat ==================================================================== Test                      Result    Flag   Units      Ref Range   Creatinine              45               mg/dL      >=16 ==================================================================== Declared Medications:  The flagging and interpretation on this report are based on the  following declared medications.  Unexpected results may arise from  inaccuracies in the declared medications.   **Note: The testing scope of this panel includes these medications:   Hydrocodone (Norco)   **Note: The testing scope of this panel does not include the  following reported medications:   Acetaminophen (Norco)  Amlodipine (Norvasc)  Atorvastatin (Lipitor)  Gabapentin (Neurontin)  Hydrochlorothiazide (Hydrodiuril)  Insulin (Basaglar)  Losartan (Cozaar)  Meloxicam (Mobic)  Metformin (Glucophage)  Sitagliptin (Januvia)  Topical ==================================================================== For clinical consultation, please call [phone]. ====================================================================      ROS  Constitutional: Denies any fever or chills Gastrointestinal: No reported hemesis, hematochezia, vomiting, or acute GI distress Musculoskeletal:  low back pain Neurological: No reported episodes of acute onset apraxia, aphasia, dysarthria, agnosia, amnesia, paralysis, loss of coordination, or loss of consciousness  Medication Review  Alcohol Swabs, Basaglar KwikPen, Emollient, HYDROcodone-acetaminophen, Insulin Pen Needle, ONE TOUCH DELICA LANCING DEV, OneTouch Delica Lancets Fine, OneTouch Verio, OneTouch Verio Reflect, amLODipine, atorvastatin, blood glucose meter kit and supplies, empagliflozin, fluticasone, gabapentin, glucose blood, hydrochlorothiazide, losartan, meloxicam, metFORMIN, ondansetron, and prochlorperazine  History Review  Allergy: [name] has No Known Allergies. Drug: Mr.  Gregory Harrison  reports that he does not currently use drugs after having used the following drugs: Marijuana. Alcohol:  reports that he does not currently use alcohol. Tobacco:  reports that he quit smoking about 35 years ago. His smoking use included cigarettes. He has a 40.00 pack-year smoking history. He has never used smokeless tobacco. Social: Mr. Ciancio  reports that he quit smoking about 35 years ago. His smoking use included cigarettes. He has a 40.00 pack-year smoking history. He has never used smokeless tobacco. He reports that he does not currently use alcohol. He reports that he does not currently use drugs after having used the following drugs: Marijuana. Medical:  has a past medical history of Arthritis, Cancer (2023), Diabetes (2000), Diabetic neuropathy, GERD (gastroesophageal reflux disease), HTN (hypertension) (2010), Hypercholesterolemia, Solitary pulmonary nodule on lung CT (08/19/2022), Vision problem, and Weight loss (07/16/2019). Surgical: Mr. Eriksson  has a past surgical history that includes Hand surgery (1980s); Nose surgery (1979); Eye surgery (Left, 1990s); Tonsillectomy; and Cholecystectomy (N/A, 10/16/2022). Family: family history includes ALS in his brother; Diabetes in his paternal grandfather; Heart attack in his father; Heart murmur in his father; Other in his mother.  Laboratory Chemistry Profile   Renal Lab Results  Component Value Date   BUN 10 10/08/2022   CREATININE 0.85 10/08/2022   BCR 21 08/08/2022   GFRAA 88 10/19/2020   GFRNONAA >60 10/08/2022    Hepatic Lab Results  Component Value Date   AST 16 08/28/2022   ALT 21 08/28/2022   ALBUMIN 4.0 08/28/2022   ALKPHOS 47 08/28/2022  Electrolytes Lab Results  Component Value Date   NA 140 10/08/2022   K 3.5 10/08/2022   CL 102 10/08/2022   CALCIUM 9.0 10/08/2022    Bone No results found for: "VD25OH", "VD125OH2TOT", "UJ8119JY7", "WG9562ZH0", "25OHVITD1", "25OHVITD2", "25OHVITD3", "TESTOFREE",  "TESTOSTERONE"  Inflammation (CRP: Acute Phase) (ESR: Chronic Phase) Lab Results  Component Value Date   ESRSEDRATE 20 08/08/2022         Note: Above Lab results reviewed.  Recent Imaging Review  MR ABDOMEN MRCP W WO CONTAST CLINICAL DATA:  Further evaluation of abnormality in gallbladder seen on prior examination.  EXAM: MRI ABDOMEN WITHOUT AND WITH CONTRAST (INCLUDING MRCP)  TECHNIQUE: Multiplanar multisequence MR imaging of the abdomen was performed both before and after the administration of intravenous contrast. Heavily T2-weighted images of the biliary and pancreatic ducts were obtained, and three-dimensional MRCP images were rendered by post processing.  CONTRAST:  15mL MULTIHANCE GADOBENATE DIMEGLUMINE 529 MG/ML IV SOLN  COMPARISON:  Right upper quadrant ultrasound August 21, 2022 and PET-CT September 05, 2022.  FINDINGS: Lower chest: No acute abnormality.  Hepatobiliary: No significant hepatic steatosis. No suspicious hepatic lesion  Large gallstone in the proximal gallbladder measures 3.8 x 2.8 cm. Distal this in the gallbladder fundus is a crescentic focus of homogeneous intrinsically T1 hyperintense material measuring 4.1 x 2.4 cm on image 52/24 which does not demonstrate postcontrast enhancement on subtraction imaging.  No biliary ductal dilation.  Pancreas: Mild senescent atrophy of the pancreatic parenchyma. No pancreatic ductal dilation. No cystic or solid hyperenhancing pancreatic lesion identified.  Spleen:  No splenomegaly or focal splenic lesion.  Adrenals/Urinary Tract: Bilateral adrenal glands appear normal. No hydronephrosis. Fluid signal bilateral renal lesions measure up to 11 mm in the right upper pole, without suspicious postcontrast enhancement consistent with benign cysts which require no independent imaging follow-up.  Stomach/Bowel: Visualized portions within the abdomen are unremarkable.  Vascular/Lymphatic: Normal caliber  abdominal aorta. The portal, splenic and superior mesenteric veins are patent. No pathologically enlarged abdominal lymph nodes.  Other:  No significant abdominal free fluid.  Musculoskeletal: No suspicious bone lesions identified.  IMPRESSION: 1. Large gallstone in the proximal gallbladder with a crescentic focus of homogeneous intrinsically T1 hyperintense material in the gallbladder body/fundus which does not demonstrate postcontrast enhancement on subtraction imaging and is favored to reflect impacted biliary sludge. 2. No suspicious biliary or hepatic lesion.  Electronically Signed   By: Maudry Mayhew M.D.   On: 09/16/2022 12:50 Note: Reviewed        Physical Exam  General appearance: Well nourished, well developed, and well hydrated. In no apparent acute distress Mental status: Alert, oriented x 3 (person, place, & time)       Respiratory: No evidence of acute respiratory distress Eyes: PERLA Vitals: BP (!) 208/84   Pulse (!) 56   Temp (!) 97.2 F (36.2 C) (Temporal)   Resp 18   Ht 6' 2.5" (1.892 m)   Wt 178 lb (80.7 kg)   SpO2 99%   BMI 22.55 kg/m  BMI: Estimated body mass index is 22.55 kg/m as calculated from the following:   Height as of this encounter: 6' 2.5" (1.892 m).   Weight as of this encounter: 178 lb (80.7 kg). Ideal: Ideal body weight: 83.4 kg (183 lb 12.1 oz)  + LBP, worse with lumbar extension Left shoulder pain, limited ROM   Assessment   Diagnosis  1. Chronic pain syndrome   2. Chronic left shoulder pain   3. Carpal tunnel syndrome of  right wrist   4. Primary osteoarthritis of left shoulder   5. Diabetic polyneuropathy associated with type 2 diabetes mellitus   6. Type 2 diabetes mellitus with diabetic polyneuropathy, without long-term current use of insulin   7. Lumbar facet arthropathy         Plan of Care   1. Chronic pain syndrome - HYDROcodone-acetaminophen (NORCO/VICODIN) 5-325 MG tablet; Take 1 tablet by mouth daily as  needed for moderate pain. For chronic pain syndrome  Dispense: 30 tablet; Refill: 0  2. Chronic left shoulder pain - HYDROcodone-acetaminophen (NORCO/VICODIN) 5-325 MG tablet; Take 1 tablet by mouth daily as needed for moderate pain. For chronic pain syndrome  Dispense: 30 tablet; Refill: 0  3. Carpal tunnel syndrome of right wrist - HYDROcodone-acetaminophen (NORCO/VICODIN) 5-325 MG tablet; Take 1 tablet by mouth daily as needed for moderate pain. For chronic pain syndrome  Dispense: 30 tablet; Refill: 0  4. Primary osteoarthritis of left shoulder - HYDROcodone-acetaminophen (NORCO/VICODIN) 5-325 MG tablet; Take 1 tablet by mouth daily as needed for moderate pain. For chronic pain syndrome  Dispense: 30 tablet; Refill: 0  5. Diabetic polyneuropathy associated with type 2 diabetes mellitus - HYDROcodone-acetaminophen (NORCO/VICODIN) 5-325 MG tablet; Take 1 tablet by mouth daily as needed for moderate pain. For chronic pain syndrome  Dispense: 30 tablet; Refill: 0  6. Type 2 diabetes mellitus with diabetic polyneuropathy, without long-term current use of insulin  7. Lumbar facet arthropathy     Pharmacotherapy (Medications Ordered): Meds ordered this encounter  Medications   HYDROcodone-acetaminophen (NORCO/VICODIN) 5-325 MG tablet    Sig: Take 1 tablet by mouth daily as needed for moderate pain. For chronic pain syndrome    Dispense:  30 tablet    Refill:  0   Orders:  No orders of the defined types were placed in this encounter.  Follow-up plan:   Return for patient will call to schedule F2F appt prn.    Recent Visits Date Type Provider Dept  01/07/23 Office Visit Gregory Jolly, MD [facility]  Showing recent visits within past 90 days and meeting all other requirements Today's Visits Date Type Provider Dept  03/27/23 Office Visit Gregory Jolly, MD [facility]  Showing today's visits and meeting all other requirements Future Appointments No visits were  found meeting these conditions. Showing future appointments within next 90 days and meeting all other requirements  I discussed the assessment and treatment plan with the patient. The patient was provided an opportunity to ask questions and all were answered. The patient agreed with the plan and demonstrated an understanding of the instructions.  Patient advised to call back or seek an in-person evaluation if the symptoms or condition worsens.  Duration of encounter: .  Note by: Gregory Jolly, MD Date: 03/27/2023; Time: 10:47 AM

## 2023-03-27 NOTE — Progress Notes (Signed)
Nursing Pain Medication Assessment:  Safety precautions to be maintained throughout the outpatient stay will include: orient to surroundings, keep bed in low position, maintain call bell within reach at all times, provide assistance with transfer out of bed and ambulation.  Medication Inspection Compliance: Pill count conducted under aseptic conditions, in front of the patient. Neither the pills nor the bottle was removed from the patient's sight at any time. Once count was completed pills were immediately returned to the patient in their original bottle.  Medication: Hydrocodone/APAP Pill/Patch Count:  15 of 60 pills remain Pill/Patch Appearance: Markings consistent with prescribed medication Bottle Appearance: Standard pharmacy container. Clearly labeled. Filled Date: 02 / 20 / 2024 Last Medication intake:  Today

## 2023-03-31 ENCOUNTER — Ambulatory Visit (INDEPENDENT_AMBULATORY_CARE_PROVIDER_SITE_OTHER): Payer: PPO | Admitting: Student

## 2023-03-31 ENCOUNTER — Encounter: Payer: Self-pay | Admitting: Student

## 2023-03-31 VITALS — BP 136/64 | HR 65 | Ht 74.5 in | Wt 183.6 lb

## 2023-03-31 DIAGNOSIS — E1159 Type 2 diabetes mellitus with other circulatory complications: Secondary | ICD-10-CM | POA: Diagnosis not present

## 2023-03-31 DIAGNOSIS — E1142 Type 2 diabetes mellitus with diabetic polyneuropathy: Secondary | ICD-10-CM

## 2023-03-31 DIAGNOSIS — I152 Hypertension secondary to endocrine disorders: Secondary | ICD-10-CM

## 2023-03-31 LAB — POCT GLYCOSYLATED HEMOGLOBIN (HGB A1C): HbA1c, POC (controlled diabetic range): 12.8 % — AB (ref 0.0–7.0)

## 2023-03-31 MED ORDER — TRULICITY 0.75 MG/0.5ML ~~LOC~~ SOAJ
0.7500 mg | SUBCUTANEOUS | 0 refills | Status: DC
Start: 2023-03-31 — End: 2023-05-06

## 2023-03-31 NOTE — Assessment & Plan Note (Addendum)
BP today initially elevated to 148/84, repeat 15 minutes later near goal- 136/64.  At/not at goal 130/80. Continue losartan 25 mg daily, HCTZ 12 and half milligrams daily, amlodipine 2.5 mg daily. BMP today

## 2023-03-31 NOTE — Progress Notes (Signed)
    SUBJECTIVE:   CHIEF COMPLAINT / HPI:   Gregory Harrison is a 72 year old male here for follow-up of hypertension, type 2 diabetes mellitus.  Hypertension Currently taking losartan 25 mg daily, HCTZ 12.5 mg daily, amlodipine 2.5 mg daily. Reports daily adherence with his medications. Denies shortness of breath, headache, chest pain, lower extremity swelling. Does not check blood pressure at home.  Type 2 diabetes Lantus, currently uncontrolled Due for A1c today.  Last A1c 9.9% in January 2024, not within goal.  At last visit, increased insulin glargine to 39 units daily. Also takes metformin 1000 mg twice daily. AM CBG 269; sometimes under 200 but rarely Has not been taking his Jardiance, insurance stopped providing coverage. Denies any symptomatic hypoglycemic episodes. He says he will never be able to get his glucose levels to 120s or lower. Says he uses a pill box at home to organize his medications and that he "takes 6 pills a day."   PERTINENT  PMH / PSH: Hypercholesterolemia, GERD  OBJECTIVE:   BP 136/64   Pulse 65   Ht 6' 2.5" (1.892 m)   Wt 183 lb 9.6 oz (83.3 kg)   SpO2 98%   BMI 23.26 kg/m   General: Alert and cooperative elderly man and appears to be in no acute distress Cardio: Normal S1 and S2, no S3 or S4. Rhythm is regular. No murmurs or rubs.   Pulm: Clear to auscultation bilaterally, no crackles, wheezing, or diminished breath sounds. Normal respiratory effort Extremities: No peripheral edema. Warm/ well perfused.  Strong radial pulses. Neuro: Cranial nerves grossly intact  ASSESSMENT/PLAN:   Hypertension associated with diabetes (HCC) BP today initially elevated to 148/84, repeat 15 minutes later near goal- 136/64.  At/not at goal 130/80. Continue losartan 25 mg daily, HCTZ 12 and half milligrams daily, amlodipine 2.5 mg daily. BMP today  Type 2 diabetes mellitus with diabetic polyneuropathy, without long-term current use of insulin (HCC) Uncontrolled.  A1c 12.2%, goal < 8.5% Had extensive discussion about glycemic control and the importance of getting his A1c is lower near goal. Initiate Trulicity today, at starting dose 0.75 mg weekly. Increase insulin glargine to 44 units daily Continue metformin 1000 mg twice daily Follow-up in 1 month    Darral Dash, DO [facility]

## 2023-03-31 NOTE — Progress Notes (Signed)
See note below

## 2023-03-31 NOTE — Patient Instructions (Addendum)
It was great to see you! Thank you for allowing me to participate in your care!  I recommend that you always bring your medications to each appointment as this makes it easy to ensure we are on the correct medications and helps [place] not miss when refills are needed.  Our plans for today:  - Increase your insulin to 44 units daily. Let me know if you start to have any low blood sugar symptoms: weakness, dizziness, cold sweats. - Continue your Metformin 1000 mg twice daily - I am sending in Trulicity for your diabetes. This is an injectable medicine that you will use once a week. If your insurance does not cover this, let me know.  Continue taking your blood pressure medicines.  We are checking some labs today, I will call you if they are abnormal will send you a MyChart message or a letter if they are normal.  If you do not hear about your labs in the next 2 weeks please let us know.  Take care and seek immediate care sooner if you develop any concerns.   Dr. Darral Dash, DO [facility]

## 2023-03-31 NOTE — Assessment & Plan Note (Addendum)
Uncontrolled. A1c 12.2%, goal < 8.5% Had extensive discussion about glycemic control and the importance of getting his A1c is lower near goal. Initiate Trulicity today, at starting dose 0.75 mg weekly. Increase insulin glargine to 44 units daily Continue metformin 1000 mg twice daily Follow-up in 1 month

## 2023-04-01 LAB — CBC
Hematocrit: 36.2 % — ABNORMAL LOW (ref 37.5–51.0)
Hemoglobin: 12.1 g/dL — ABNORMAL LOW (ref 13.0–17.7)
MCH: 28.2 pg (ref 26.6–33.0)
MCHC: 33.4 g/dL (ref 31.5–35.7)
MCV: 84 fL (ref 79–97)
Platelets: 206 10*3/uL (ref 150–450)
RBC: 4.29 x10E6/uL (ref 4.14–5.80)
RDW: 13.5 % (ref 11.6–15.4)
WBC: 6.2 10*3/uL (ref 3.4–10.8)

## 2023-04-01 LAB — BASIC METABOLIC PANEL
BUN/Creatinine Ratio: 8 — ABNORMAL LOW (ref 10–24)
BUN: 7 mg/dL — ABNORMAL LOW (ref 8–27)
CO2: 21 mmol/L (ref 20–29)
Calcium: 9.2 mg/dL (ref 8.6–10.2)
Chloride: 101 mmol/L (ref 96–106)
Creatinine, Ser: 0.9 mg/dL (ref 0.76–1.27)
Glucose: 385 mg/dL — ABNORMAL HIGH (ref 70–99)
Potassium: 4 mmol/L (ref 3.5–5.2)
Sodium: 140 mmol/L (ref 134–144)
eGFR: 90 mL/min/{1.73_m2} (ref >59)

## 2023-04-01 LAB — LIPID PANEL
Chol/HDL Ratio: 2.3 ratio (ref 0.0–5.0)
Cholesterol, Total: 101 mg/dL (ref 100–199)
HDL: 43 mg/dL (ref >39)
LDL Chol Calc (NIH): 30 mg/dL (ref 0–99)
Triglycerides: 170 mg/dL — ABNORMAL HIGH (ref 0–149)
VLDL Cholesterol Cal: 28 mg/dL (ref 5–40)

## 2023-04-07 ENCOUNTER — Telehealth: Payer: Self-pay

## 2023-04-07 NOTE — Telephone Encounter (Signed)
Trulicity approved through April 06 2024.  Called pharmacy with PA approval. Medication cost with insurance is $11.20.   Called patient with update. Patient appreciative.   Veronda Prude, RN

## 2023-04-07 NOTE — Telephone Encounter (Signed)
Patient calls nurse line regarding PA needed for Trulicity. Called pharmacy and initiated PA through Cover my meds.   Key: [id number]  Will check status in 24-48 hours.   Veronda Prude, RN

## 2023-04-15 ENCOUNTER — Other Ambulatory Visit: Payer: Self-pay

## 2023-04-15 DIAGNOSIS — E118 Type 2 diabetes mellitus with unspecified complications: Secondary | ICD-10-CM

## 2023-04-16 MED ORDER — ONETOUCH VERIO VI STRP
ORAL_STRIP | 12 refills | Status: DC
Start: 2023-04-16 — End: 2023-08-13

## 2023-05-05 ENCOUNTER — Other Ambulatory Visit: Payer: Self-pay | Admitting: Student

## 2023-05-08 ENCOUNTER — Ambulatory Visit (INDEPENDENT_AMBULATORY_CARE_PROVIDER_SITE_OTHER): Payer: PPO | Admitting: *Deleted

## 2023-05-08 ENCOUNTER — Telehealth: Payer: Self-pay | Admitting: Student

## 2023-05-08 DIAGNOSIS — Z0000 Encounter for general adult medical examination without abnormal findings: Secondary | ICD-10-CM

## 2023-05-08 DIAGNOSIS — Z1211 Encounter for screening for malignant neoplasm of colon: Secondary | ICD-10-CM

## 2023-05-08 NOTE — Telephone Encounter (Signed)
Copied from CRM [id number]. Topic: Appointment Scheduling - Scheduling Inquiry for Clinic >> May 08, 2023  8:04 AM Dondra Prader A wrote: Reason for CRM: Pt is calling to schedule an injection in his left shoulder with Dr. Ashley Royalty.  Please advise.

## 2023-05-08 NOTE — Telephone Encounter (Signed)
Please schedule a 40 in procedure appointment, next available

## 2023-05-08 NOTE — Progress Notes (Deleted)
Subjective:   Gregory Harrison is a 72 y.o. male who presents for Medicare Annual/Subsequent preventive examination.  I connected with  Gregory Harrison on 05/08/23 by a telephone enabled telemedicine application and verified that I am speaking with the correct person using two identifiers.   I discussed the limitations of evaluation and management by telemedicine. The patient expressed understanding and agreed to proceed.  Patient location: home  Provider location: telephone/home  Vital Signs: Unable to obtain new vitals due to this being a telehealth visit.    Review of Systems     Cardiac Risk Factors include: advanced age (>40men, >14 women);diabetes mellitus;male gender;hypertension     Objective:    Today's Vitals   05/08/23 1111  PainSc: 4    There is no height or weight on file to calculate BMI.     05/08/2023   11:14 AM 03/31/2023    1:33 PM 01/07/2023    9:49 AM 12/27/2022    9:31 AM 11/11/2022    8:59 AM 10/08/2022    2:11 PM 08/08/2022   10:26 AM  Advanced Directives  Does Patient Have a Medical Advance Directive? No No Unable to assess, patient is non-responsive or altered mental status No No No No  Would patient like information on creating a medical advance directive?  No - Patient declined  No - Patient declined No - Patient declined No - Patient declined No - Patient declined    Current Medications (verified) Outpatient Encounter Medications as of 05/08/2023  Medication Sig   Alcohol Swabs PADS Use with insulin   amLODipine (NORVASC) 2.5 MG tablet Take 1 tablet (2.5 mg total) by mouth at bedtime.   atorvastatin (LIPITOR) 40 MG tablet TAKE 1 TABLET BY MOUTH EVERY DAY   blood glucose meter kit and supplies Dispense based on patient and insurance preference. Use up to four times daily as directed. (FOR ICD-10 E10.9, E11.9).   Blood Glucose Monitoring Suppl (ONETOUCH VERIO REFLECT) w/Device KIT CHECK BLOOD GLUCOSE TWICE DAILY   Blood Glucose Monitoring Suppl  (ONETOUCH VERIO) w/Device KIT 1 each by Does not apply route as directed.   Emollient (AQUAPHOR ADVANCED THERAPY EX) Apply 1 Application topically 2 (two) times daily.   fluticasone (FLONASE) 50 MCG/ACT nasal spray Place 1 spray into both nostrils daily as needed for allergies or rhinitis.   gabapentin (NEURONTIN) 400 MG capsule Take 3 capsules (1,200 mg total) by mouth at bedtime.   glucose blood (ONETOUCH VERIO) test strip Use to check blood sugar 3x per day. E11.9   hydrochlorothiazide (HYDRODIURIL) 12.5 MG tablet TAKE 1 TABLET BY MOUTH EVERY DAY   [START ON 07/10/2023] HYDROcodone-acetaminophen (NORCO/VICODIN) 5-325 MG tablet Take 1 tablet by mouth daily as needed for moderate pain. For chronic pain syndrome   Insulin Glargine (BASAGLAR KWIKPEN) 100 UNIT/ML Inject 34 Units into the skin daily. (Patient taking differently: Inject 39 Units into the skin daily.)   Insulin Pen Needle 31G X 8 MM MISC Check blood sugars twice a day   Lancet Devices (ONE TOUCH DELICA LANCING DEV) MISC 1 each by Does not apply route as directed.   losartan (COZAAR) 25 MG tablet TAKE 1 TABLET BY MOUTH EVERY DAY   meloxicam (MOBIC) 7.5 MG tablet TAKE 1 TABLET BY MOUTH EVERY DAY AS NEEDED FOR PAIN   metFORMIN (GLUCOPHAGE-XR) 500 MG 24 hr tablet TAKE 2 TABLETS BY MOUTH TWICE A DAY   ondansetron (ZOFRAN-ODT) 8 MG disintegrating tablet TAKE 1 TABLET BY MOUTH EVERY 8 HOURS AS NEEDED  FOR NAUSEA OR VOMITING.   ONETOUCH DELICA LANCETS FINE MISC 1 each by Does not apply route 3 (three) times daily.   prochlorperazine (COMPAZINE) 10 MG tablet Take 1 tablet (10 mg total) by mouth every 6 (six) hours as needed for nausea or vomiting.   TRULICITY 0.75 MG/0.5ML SOPN INJECT 0.75 MG INTO THE SKIN ONCE A WEEK   empagliflozin (JARDIANCE) 25 MG TABS tablet Take 25 mg by mouth daily. (Patient not taking: Reported on 03/31/2023)   No facility-administered encounter medications on file as of 05/08/2023.    Allergies (verified) Patient has no  known allergies.   History: Past Medical History:  Diagnosis Date   Arthritis    2010   Cancer (HCC) 2023   L hand- skin cancer   Diabetes (HCC) 2000   Diabetic neuropathy (HCC)    GERD (gastroesophageal reflux disease)    HTN (hypertension) 2010   Hypercholesterolemia    Solitary pulmonary nodule on lung CT 08/19/2022   Chest CT 08/15/22: 9 mm LUL nodule with spiculated margins concerning for bronchogenic carcinoma.   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 89, blurry vision   Weight loss 07/16/2019   Past Surgical History:  Procedure Laterality Date   CHOLECYSTECTOMY N/A 10/16/2022   Procedure: LAPAROSCOPIC CHOLECYSTECTOMY;  Surgeon: Fritzi Mandes, MD;  Location: [facility] OR;  Service: General;  Laterality: N/A;   EYE SURGERY Left 1990s   " to peel off a growth"   HAND SURGERY  1980s   R hand pointer finger    NOSE SURGERY  1979   after broke his nose   TONSILLECTOMY     at age 32   Family History  Problem Relation Age of Onset   Heart attack Father        died in 85, had open surgery   Heart murmur Father    Other Mother        bladder taken out for unknown reason. unspecified brain tumor   ALS Brother    Diabetes Paternal Grandfather    Colon cancer Neg Hx    Colon polyps Neg Hx    Esophageal cancer Neg Hx    Rectal cancer Neg Hx    Stomach cancer Neg Hx    Social History   Socioeconomic History   Marital status: Married    Spouse name: Gregory Harrison   Number of children: 2   Years of education: 12   Highest education level: High school graduate  Occupational History   Occupation: retired  Tobacco Use   Smoking status: Former    Packs/day: 2.00    Years: 20.00    Additional pack years: 0.00    Total pack years: 40.00    Types: Cigarettes    Quit date: 1989    Years since quitting: 35.4   Smokeless tobacco: Never  Vaping Use   Vaping Use: Never used  Substance and Sexual Activity   Alcohol use: Not Currently   Drug use: Not Currently    Types:  Marijuana   Sexual activity: Yes    Partners: Female  Other Topics Concern   Not on file  Social History Narrative   Patient lives with his wife, daughter and her 2 children.    Patient enjoys hunting and fishing and being outside.    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    Patient has 2 dogs and 1 cat.    Social Determinants of  Health   Financial Resource Strain: Low Risk  (05/08/2023)   Overall Financial Resource Strain (CARDIA)    Difficulty of Paying Living Expenses: Not hard at all  Food Insecurity: No Food Insecurity (05/08/2023)   Hunger Vital Sign    Worried About Running Out of Food in the Last Year: Never true    Ran Out of Food in the Last Year: Never true  Transportation Needs: No Transportation Needs (05/08/2023)   PRAPARE - Administrator, Civil Service (Medical): No    Lack of Transportation (Non-Medical): No  Physical Activity: Sufficiently Active (01/07/2020)   Exercise Vital Sign    Days of Exercise per Week: 7 days    Minutes of Exercise per Session: 60 min  Stress: No Stress Concern Present (05/08/2023)   [employer] of Occupational Health - Occupational Stress Questionnaire    Feeling of Stress : Not at all  Social Connections: Moderately Integrated (05/08/2023)   Social Connection and Isolation Panel [NHANES]    Frequency of Communication with Friends and Family: Twice a week    Frequency of Social Gatherings with Friends and Family: Three times a week    Attends Religious Services: Never    Active Member of Clubs or Organizations: Yes    Attends Engineer, structural: More than 4 times per year    Marital Status: Married    Tobacco Counseling  Patient does not Qualify for lung cancer screening quit more than 15 years ago  Clinical Intake:  Pre-visit preparation completed: Yes  Pain : 0-10 Pain Score: 4  Pain Type: Chronic pain Pain Location: Shoulder Pain Descriptors /  Indicators: Burning, Aching, Dull Pain Onset: 1 to 4 weeks ago Pain Frequency: Intermittent     Nutritional Risks: None Diabetes: Yes CBG done?: No Did pt. bring in CBG monitor from home?: No     Diabetic? Yes  Nutrition Risk Assessment:  Has the patient had any N/V/D within the last 2 months?  No  Does the patient have any non-healing wounds?  No  Has the patient had any unintentional weight loss or weight gain?  Yes   Diabetes:  Is the patient diabetic?  Yes  If diabetic, was a CBG obtained today?  No  Did the patient bring in their glucometer from home?  No  How often do you monitor your CBG's?  1 x a day.   Financial Strains and Diabetes Management:  Are you having any financial strains with the device, your supplies or your medication? No .  Does the patient want to be seen by Chronic Care Management for management of their diabetes?  No  Would the patient like to be referred to a Nutritionist or for Diabetic Management?  No   Diabetic Exams:  Diabetic Eye Exam: Completed .Pt has been advised about the importance in completing this exam. A referral has been placed today. Message sent to referral coordinator for scheduling purposes. Advised pt to expect a call from office referred to regarding appt.  Diabetic Foot Exam: . Pt has been advised about the importance in completing this exam.   Interpreter Needed?: No  Information entered by :: Remi Haggard LPN A

## 2023-05-12 NOTE — Telephone Encounter (Signed)
Patient states that he went to pick up his trulicity last week and it has risen in price from $11.20 to $47 a month and he can't afford that.  Patient would like to discuss with provider another option.  Demiyah Fischbach,CMA

## 2023-05-15 NOTE — Patient Instructions (Signed)
Gregory Harrison , Thank you for taking time to come for your Medicare Wellness Visit. I appreciate your ongoing commitment to your health goals. Please review the following plan we discussed and let me know if I can assist you in the future.   Screening recommendations/referrals: Colonoscopy: Education provided Recommended yearly ophthalmology/optometry visit for glaucoma screening and checkup Recommended yearly dental visit for hygiene and checkup  Vaccinations: Influenza vaccine: Education provided Pneumococcal vaccine: up to date Tdap vaccine: Education provided Shingles vaccine: Education provided    Advanced directives: Education provided     Preventive Care 65 Years and Older, Male Preventive care refers to lifestyle choices and visits with your health care provider that can promote health and wellness. What does preventive care include? A yearly physical exam. This is also called an annual well check. Dental exams once or twice a year. Routine eye exams. Ask your health care provider how often you should have your eyes checked. Personal lifestyle choices, including: Daily care of your teeth and gums. Regular physical activity. Eating a healthy diet. Avoiding tobacco and drug use. Limiting alcohol use. Practicing safe sex. Taking low doses of aspirin every day. Taking vitamin and mineral supplements as recommended by your health care provider. What happens during an annual well check? The services and screenings done by your health care provider during your annual well check will depend on your age, overall health, lifestyle risk factors, and family history of disease. Counseling  Your health care provider may ask you questions about your: Alcohol use. Tobacco use. Drug use. Emotional well-being. Home and relationship well-being. Sexual activity. Eating habits. History of falls. Memory and ability to understand (cognition). Work and work Astronomer. Screening  You  may have the following tests or measurements: Height, weight, and BMI. Blood pressure. Lipid and cholesterol levels. These may be checked every 5 years, or more frequently if you are over 81 years old. Skin check. Lung cancer screening. You may have this screening every year starting at age 4 if you have a 30-pack-year history of smoking and currently smoke or have quit within the past 15 years. Fecal occult blood test (FOBT) of the stool. You may have this test every year starting at age 36. Flexible sigmoidoscopy or colonoscopy. You may have a sigmoidoscopy every 5 years or a colonoscopy every 10 years starting at age 71. Prostate cancer screening. Recommendations will vary depending on your family history and other risks. Hepatitis C blood test. Hepatitis B blood test. Sexually transmitted disease (STD) testing. Diabetes screening. This is done by checking your blood sugar (glucose) after you have not eaten for a while (fasting). You may have this done every 1-3 years. Abdominal aortic aneurysm (AAA) screening. You may need this if you are a current or former smoker. Osteoporosis. You may be screened starting at age 93 if you are at high risk. Talk with your health care provider about your test results, treatment options, and if necessary, the need for more tests. Vaccines  Your health care provider may recommend certain vaccines, such as: Influenza vaccine. This is recommended every year. Tetanus, diphtheria, and acellular pertussis (Tdap, Td) vaccine. You may need a Td booster every 10 years. Zoster vaccine. You may need this after age 61. Pneumococcal 13-valent conjugate (PCV13) vaccine. One dose is recommended after age 96. Pneumococcal polysaccharide (PPSV23) vaccine. One dose is recommended after age 85. Talk to your health care provider about which screenings and vaccines you need and how often you need them. This information is  not intended to replace advice given to you by your  health care provider. Make sure you discuss any questions you have with your health care provider. Document Released: 12/22/2015 Document Revised: 08/14/2016 Document Reviewed: 09/26/2015 Elsevier Interactive Patient Education  2017 [employer].  Fall Prevention in the Home Falls can cause injuries. They can happen to people of all ages. There are many things you can do to make your home safe and to help prevent falls. What can I do on the outside of my home? Regularly fix the edges of walkways and driveways and fix any cracks. Remove anything that might make you trip as you walk through a door, such as a raised step or threshold. Trim any bushes or trees on the path to your home. Use bright outdoor lighting. Clear any walking paths of anything that might make someone trip, such as rocks or tools. Regularly check to see if handrails are loose or broken. Make sure that both sides of any steps have handrails. Any raised decks and porches should have guardrails on the edges. Have any leaves, snow, or ice cleared regularly. Use sand or salt on walking paths during winter. Clean up any spills in your garage right away. This includes oil or grease spills. What can I do in the bathroom? Use night lights. Install grab bars by the toilet and in the tub and shower. Do not use towel bars as grab bars. Use non-skid mats or decals in the tub or shower. If you need to sit down in the shower, use a plastic, non-slip stool. Keep the floor dry. Clean up any water that spills on the floor as soon as it happens. Remove soap buildup in the tub or shower regularly. Attach bath mats securely with double-sided non-slip rug tape. Do not have throw rugs and other things on the floor that can make you trip. What can I do in the bedroom? Use night lights. Make sure that you have a light by your bed that is easy to reach. Do not use any sheets or blankets that are too big for your bed. They should not hang down  onto the floor. Have a firm chair that has side arms. You can use this for support while you get dressed. Do not have throw rugs and other things on the floor that can make you trip. What can I do in the kitchen? Clean up any spills right away. Avoid walking on wet floors. Keep items that you use a lot in easy-to-reach places. If you need to reach something above you, use a strong step stool that has a grab bar. Keep electrical cords out of the way. Do not use floor polish or wax that makes floors slippery. If you must use wax, use non-skid floor wax. Do not have throw rugs and other things on the floor that can make you trip. What can I do with my stairs? Do not leave any items on the stairs. Make sure that there are handrails on both sides of the stairs and use them. Fix handrails that are broken or loose. Make sure that handrails are as long as the stairways. Check any carpeting to make sure that it is firmly attached to the stairs. Fix any carpet that is loose or worn. Avoid having throw rugs at the top or bottom of the stairs. If you do have throw rugs, attach them to the floor with carpet tape. Make sure that you have a light switch at the top of the  stairs and the bottom of the stairs. If you do not have them, ask someone to add them for you. What else can I do to help prevent falls? Wear shoes that: Do not have high heels. Have rubber bottoms. Are comfortable and fit you well. Are closed at the toe. Do not wear sandals. If you use a stepladder: Make sure that it is fully opened. Do not climb a closed stepladder. Make sure that both sides of the stepladder are locked into place. Ask someone to hold it for you, if possible. Clearly mark and make sure that you can see: Any grab bars or handrails. First and last steps. Where the edge of each step is. Use tools that help you move around (mobility aids) if they are needed. These include: Canes. Walkers. Scooters. Crutches. Turn  on the lights when you go into a dark area. Replace any light bulbs as soon as they burn out. Set up your furniture so you have a clear path. Avoid moving your furniture around. If any of your floors are uneven, fix them. If there are any pets around you, be aware of where they are. Review your medicines with your doctor. Some medicines can make you feel dizzy. This can increase your chance of falling. Ask your doctor what other things that you can do to help prevent falls. This information is not intended to replace advice given to you by your health care provider. Make sure you discuss any questions you have with your health care provider. Document Released: 09/21/2009 Document Revised: 05/02/2016 Document Reviewed: 12/30/2014 Elsevier Interactive Patient Education  2017 [employer].

## 2023-05-19 ENCOUNTER — Encounter: Payer: Self-pay | Admitting: Family Medicine

## 2023-05-19 ENCOUNTER — Ambulatory Visit (INDEPENDENT_AMBULATORY_CARE_PROVIDER_SITE_OTHER): Payer: PPO | Admitting: Family Medicine

## 2023-05-19 ENCOUNTER — Other Ambulatory Visit (INDEPENDENT_AMBULATORY_CARE_PROVIDER_SITE_OTHER): Payer: PPO | Admitting: Radiology

## 2023-05-19 VITALS — BP 122/78 | HR 86 | Ht 74.5 in | Wt 183.0 lb

## 2023-05-19 DIAGNOSIS — M7542 Impingement syndrome of left shoulder: Secondary | ICD-10-CM | POA: Diagnosis not present

## 2023-05-19 DIAGNOSIS — M7522 Bicipital tendinitis, left shoulder: Secondary | ICD-10-CM

## 2023-05-19 HISTORY — DX: Bicipital tendinitis, left shoulder: M75.22

## 2023-05-19 HISTORY — DX: Impingement syndrome of left shoulder: M75.42

## 2023-05-19 MED ORDER — TRIAMCINOLONE ACETONIDE 40 MG/ML IJ SUSP
80.0000 mg | Freq: Once | INTRAMUSCULAR | Status: AC
Start: 2023-05-19 — End: 2023-05-19
  Administered 2023-05-19: 80 mg via INTRAMUSCULAR

## 2023-05-19 NOTE — Progress Notes (Signed)
     Primary Care / Sports Medicine Office Visit  Patient Information:  Patient ID: Gregory Harrison, male DOB: 1950-12-18 Age: 72 y.o. MRN: [id number]   Gregory Harrison is a pleasant 72 y.o. male presenting with the following:  Chief Complaint  Patient presents with   Shoulder Pain    Feels bone on bone, wakes up at night, 3 months    Vitals:   05/19/23 1508  BP: 122/78  Pulse: 86  SpO2: 98%   Vitals:   05/19/23 1508  Weight: 183 lb (83 kg)  Height: 6' 2.5" (1.892 m)   Body mass index is 23.18 kg/m.  No results found.   Independent interpretation of notes and tests performed by another provider:   None  Procedures performed:   Procedure:  Injection of left subacromial space under ultrasound guidance. Ultrasound guidance utilized for in-plane approach to the left subacromial space, no sonographic evidence of tendinopathy Samsung HS60 device utilized with permanent recording / reporting. Verbal informed consent obtained and verified. Skin prepped in a sterile fashion. Ethyl chloride for topical local analgesia.  Completed without difficulty and tolerated well. Medication: triamcinolone acetonide 40 mg/mL suspension for injection 1 mL total and 2 mL lidocaine 1% without epinephrine utilized for needle placement anesthetic Advised to contact for fevers/chills, erythema, induration, drainage, or persistent bleeding.  Procedure:  Injection of left biceps tendon sheath under ultrasound guidance. Ultrasound guidance utilized for out of plane approach, peritendinous hypoechoic region consistent with fluid noted Samsung HS60 device utilized with permanent recording / reporting. Verbal informed consent obtained and verified. Skin prepped in a sterile fashion. Ethyl chloride for topical local analgesia.  Completed without difficulty and tolerated well. Medication: triamcinolone acetonide 40 mg/mL suspension for injection 1 mL total and 2 mL lidocaine 1% without epinephrine utilized  for needle placement anesthetic Advised to contact for fevers/chills, erythema, induration, drainage, or persistent bleeding.   Pertinent History, Exam, Impression, and Recommendations:   Gregory Harrison was seen today for shoulder pain.  Rotator cuff impingement syndrome, left Assessment & Plan: Acute on chronic left shoulder pain in the setting of underlying degenerative changes, most really symptomatic over the past 1-2 months.  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  Exam shows focality to the supraspinatus where there is 5 -/5 strength, positive impingement, additionally to the biceps tendon with positive speeds and Yergason's testing.  Given his excellent response in the past, we revisited treatment strategies, plan as follows: - Patient elected to proceed with subacromial corticosteroid injection and biceps tendon sheath corticosteroid injection under ultrasound guidance - Start home-based rehab for maintenance of symptom control - Follow-up as needed  Orders: -     [place] LIMITED JOINT SPACE STRUCTURES UP LEFT; Future  Biceps tendinitis, left Assessment & Plan: See additional assessment(s) for plan details.  Orders: -     [place] LIMITED JOINT SPACE STRUCTURES UP LEFT; Future     Orders & Medications No orders of the defined types were placed in this encounter.  Orders Placed This Encounter  Procedures   [place] LIMITED JOINT SPACE STRUCTURES UP LEFT     No follow-ups on file.     Jerrol Banana, MD, [facility]   Primary Care Sports Medicine Primary Care and Sports Medicine at [facility]

## 2023-05-19 NOTE — Assessment & Plan Note (Signed)
Acute on chronic left shoulder pain in the setting of underlying degenerative changes, most really symptomatic over the past 1-2 months.  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  Exam shows focality to the supraspinatus where there is 5 -/5 strength, positive impingement, additionally to the biceps tendon with positive speeds and Yergason's testing.  Given his excellent response in the past, we revisited treatment strategies, plan as follows: - Patient elected to proceed with subacromial corticosteroid injection and biceps tendon sheath corticosteroid injection under ultrasound guidance - Start home-based rehab for maintenance of symptom control - Follow-up as needed

## 2023-05-19 NOTE — Addendum Note (Signed)
Addended by: Jerl Santos on: 05/19/2023 04:57 PM   Modules accepted: Orders

## 2023-05-19 NOTE — Patient Instructions (Signed)
You have just been given a cortisone injection to reduce pain and inflammation. After the injection you may notice immediate relief of pain as a result of the Lidocaine. It is important to rest the area of the injection for 24 to 48 hours after the injection. There is a possibility of some temporary increased discomfort and swelling for up to 72 hours until the cortisone begins to work. If you do have pain, simply rest the joint and use ice. If you can tolerate over the counter medications, you can try Tylenol, Aleve, or Advil for added relief per package instructions. 

## 2023-05-19 NOTE — Assessment & Plan Note (Signed)
See additional assessment(s) for plan details. 

## 2023-06-04 NOTE — Telephone Encounter (Signed)
Pt called about seeing if there is another medicine he can use instead of Trulicity. He can not afford to pay almost $50 a month. Please call pt to discuss.  Sunday Spillers, CMA

## 2023-06-10 MED ORDER — SEMAGLUTIDE(0.25 OR 0.5MG/DOS) 2 MG/1.5ML ~~LOC~~ SOPN
0.2500 mg | PEN_INJECTOR | SUBCUTANEOUS | 0 refills | Status: DC
Start: 2023-06-10 — End: 2023-08-07

## 2023-06-10 NOTE — Addendum Note (Signed)
Addended by: Darral Dash on: 06/10/2023 07:42 PM   Modules accepted: Orders

## 2023-06-11 ENCOUNTER — Telehealth: Payer: Self-pay | Admitting: Student in an Organized Health Care Education/Training Program

## 2023-06-11 ENCOUNTER — Other Ambulatory Visit: Payer: Self-pay | Admitting: *Deleted

## 2023-06-11 ENCOUNTER — Telehealth: Payer: Self-pay | Admitting: *Deleted

## 2023-06-11 DIAGNOSIS — G894 Chronic pain syndrome: Secondary | ICD-10-CM

## 2023-06-11 DIAGNOSIS — G8929 Other chronic pain: Secondary | ICD-10-CM

## 2023-06-11 DIAGNOSIS — G5601 Carpal tunnel syndrome, right upper limb: Secondary | ICD-10-CM

## 2023-06-11 DIAGNOSIS — E1142 Type 2 diabetes mellitus with diabetic polyneuropathy: Secondary | ICD-10-CM

## 2023-06-11 DIAGNOSIS — M19012 Primary osteoarthritis, left shoulder: Secondary | ICD-10-CM

## 2023-06-11 MED ORDER — HYDROCODONE-ACETAMINOPHEN 5-325 MG PO TABS
1.0000 | ORAL_TABLET | Freq: Every day | ORAL | 0 refills | Status: DC | PRN
Start: 2023-06-23 — End: 2023-08-07

## 2023-06-11 NOTE — Telephone Encounter (Signed)
Rx request sent to Dr. Lateef 

## 2023-06-11 NOTE — Telephone Encounter (Signed)
Please call patient about his meds. He does not have a script to fill for July. Has one for Aug.

## 2023-06-11 NOTE — Telephone Encounter (Signed)
[facility], cancelled Rx for Hydrocodone dated 07-10-23. Called patient, left message informing him that new script may be filled on 06-23-23.

## 2023-06-13 ENCOUNTER — Telehealth: Payer: Self-pay

## 2023-06-13 NOTE — Telephone Encounter (Signed)
A Prior Authorization was initiated & APPROVED for this patients OZEMPIC through CoverMyMeds.   Approved until 06/12/24  Key: [id number]

## 2023-06-16 ENCOUNTER — Encounter: Payer: Self-pay | Admitting: Gastroenterology

## 2023-07-23 NOTE — Progress Notes (Signed)
Called patient on 07-23-2023 to update quiestions that did not pull over into original note on 05-08-2023

## 2023-07-24 NOTE — Progress Notes (Addendum)
Subjective:   Gregory Harrison is a 72 y.o. male who presents for Medicare Annual/Subsequent preventive examination.  I connected with  SHAHEER KINAS on 05-08-2023 by a telephone enabled telemedicine application and verified that I am speaking with the correct person using two identifiers.   I discussed the limitations of evaluation and management by telemedicine. The patient expressed understanding and agreed to proceed.  Patient location: home  Provider location: telephone/home  Vital Signs: Unable to obtain new vitals due to this being a telehealth visit.    Review of Systems     Cardiac Risk Factors include: advanced age (>23men, >51 women);diabetes mellitus;male gender;hypertension     Objective:    Today's Vitals   05/08/23 1111  PainSc: 4    There is no height or weight on file to calculate BMI.     05/08/2023   11:14 AM 03/31/2023    1:33 PM 01/07/2023    9:49 AM 12/27/2022    9:31 AM 11/11/2022    8:59 AM 10/08/2022    2:11 PM 08/08/2022   10:26 AM  Advanced Directives  Does Patient Have a Medical Advance Directive? No No Unable to assess, patient is non-responsive or altered mental status No No No No  Would patient like information on creating a medical advance directive?  No - Patient declined  No - Patient declined No - Patient declined No - Patient declined No - Patient declined    Current Medications (verified) Outpatient Encounter Medications as of 05/08/2023  Medication Sig   Alcohol Swabs PADS Use with insulin   amLODipine (NORVASC) 2.5 MG tablet Take 1 tablet (2.5 mg total) by mouth at bedtime.   atorvastatin (LIPITOR) 40 MG tablet TAKE 1 TABLET BY MOUTH EVERY DAY   blood glucose meter kit and supplies Dispense based on patient and insurance preference. Use up to four times daily as directed. (FOR ICD-10 E10.9, E11.9).   Blood Glucose Monitoring Suppl (ONETOUCH VERIO REFLECT) w/Device KIT CHECK BLOOD GLUCOSE TWICE DAILY   Blood Glucose Monitoring Suppl  (ONETOUCH VERIO) w/Device KIT 1 each by Does not apply route as directed.   Emollient (AQUAPHOR ADVANCED THERAPY EX) Apply 1 Application topically 2 (two) times daily.   fluticasone (FLONASE) 50 MCG/ACT nasal spray Place 1 spray into both nostrils daily as needed for allergies or rhinitis.   gabapentin (NEURONTIN) 400 MG capsule Take 3 capsules (1,200 mg total) by mouth at bedtime.   glucose blood (ONETOUCH VERIO) test strip Use to check blood sugar 3x per day. E11.9   hydrochlorothiazide (HYDRODIURIL) 12.5 MG tablet TAKE 1 TABLET BY MOUTH EVERY DAY   Insulin Glargine (BASAGLAR KWIKPEN) 100 UNIT/ML Inject 34 Units into the skin daily. (Patient taking differently: Inject 39 Units into the skin daily.)   Insulin Pen Needle 31G X 8 MM MISC Check blood sugars twice a day   Lancet Devices (ONE TOUCH DELICA LANCING DEV) MISC 1 each by Does not apply route as directed.   losartan (COZAAR) 25 MG tablet TAKE 1 TABLET BY MOUTH EVERY DAY   meloxicam (MOBIC) 7.5 MG tablet TAKE 1 TABLET BY MOUTH EVERY DAY AS NEEDED FOR PAIN   metFORMIN (GLUCOPHAGE-XR) 500 MG 24 hr tablet TAKE 2 TABLETS BY MOUTH TWICE A DAY   ondansetron (ZOFRAN-ODT) 8 MG disintegrating tablet TAKE 1 TABLET BY MOUTH EVERY 8 HOURS AS NEEDED FOR NAUSEA OR VOMITING.   ONETOUCH DELICA LANCETS FINE MISC 1 each by Does not apply route 3 (three) times daily.   prochlorperazine (  COMPAZINE) 10 MG tablet Take 1 tablet (10 mg total) by mouth every 6 (six) hours as needed for nausea or vomiting.   [DISCONTINUED] HYDROcodone-acetaminophen (NORCO/VICODIN) 5-325 MG tablet Take 1 tablet by mouth daily as needed for moderate pain. For chronic pain syndrome   [DISCONTINUED] TRULICITY 0.75 MG/0.5ML SOPN INJECT 0.75 MG INTO THE SKIN ONCE A WEEK   empagliflozin (JARDIANCE) 25 MG TABS tablet Take 25 mg by mouth daily.   No facility-administered encounter medications on file as of 05/08/2023.    Allergies (verified) Patient has no known allergies.    History: Past Medical History:  Diagnosis Date   Arthritis    2010   Cancer (HCC) 2023   L hand- skin cancer   Diabetes (HCC) 2000   Diabetic neuropathy (HCC)    GERD (gastroesophageal reflux disease)    HTN (hypertension) 2010   Hypercholesterolemia    Solitary pulmonary nodule on lung CT 08/19/2022   Chest CT 08/15/22: 9 mm LUL nodule with spiculated margins concerning for bronchogenic carcinoma.   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 73, blurry vision   Weight loss 07/16/2019   Past Surgical History:  Procedure Laterality Date   CHOLECYSTECTOMY N/A 10/16/2022   Procedure: LAPAROSCOPIC CHOLECYSTECTOMY;  Surgeon: Fritzi Mandes, MD;  Location: [facility] OR;  Service: General;  Laterality: N/A;   EYE SURGERY Left 1990s   " to peel off a growth"   HAND SURGERY  1980s   R hand pointer finger    NOSE SURGERY  1979   after broke his nose   TONSILLECTOMY     at age 36   Family History  Problem Relation Age of Onset   Heart attack Father        died in 20, had open surgery   Heart murmur Father    Other Mother        bladder taken out for unknown reason. unspecified brain tumor   ALS Brother    Diabetes Paternal Grandfather    Colon cancer Neg Hx    Colon polyps Neg Hx    Esophageal cancer Neg Hx    Rectal cancer Neg Hx    Stomach cancer Neg Hx    Social History   Socioeconomic History   Marital status: Married    Spouse name: Raylin Butland   Number of children: 2   Years of education: 12   Highest education level: High school graduate  Occupational History   Occupation: retired  Tobacco Use   Smoking status: Former    Current packs/day: 0.00    Average packs/day: 2.0 packs/day for 20.0 years (40.0 ttl pk-yrs)    Types: Cigarettes    Start date: 72    Quit date: 1989    Years since quitting: 35.6   Smokeless tobacco: Never  Vaping Use   Vaping status: Never Used  Substance and Sexual Activity   Alcohol use: Not Currently   Drug use: Not Currently     Types: Marijuana   Sexual activity: Yes    Partners: Female  Other Topics Concern   Not on file  Social History Narrative   Patient lives with his wife, daughter and her 2 children.    Patient enjoys hunting and fishing and being outside.    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    Patient has 2 dogs and 1 cat.    Social Determinants of Health   Financial Resource Strain:  Low Risk  (05/08/2023)   Overall Financial Resource Strain (CARDIA)    Difficulty of Paying Living Expenses: Not hard at all  Food Insecurity: No Food Insecurity (05/08/2023)   Hunger Vital Sign    Worried About Running Out of Food in the Last Year: Never true    Ran Out of Food in the Last Year: Never true  Transportation Needs: No Transportation Needs (05/08/2023)   PRAPARE - Administrator, Civil Service (Medical): No    Lack of Transportation (Non-Medical): No  Physical Activity: Sufficiently Active (05/08/2023)   Exercise Vital Sign    Days of Exercise per Week: 4 days    Minutes of Exercise per Session: 40 min  Stress: No Stress Concern Present (05/08/2023)   [employer] of Occupational Health - Occupational Stress Questionnaire    Feeling of Stress : Not at all  Social Connections: Moderately Integrated (05/08/2023)   Social Connection and Isolation Panel [NHANES]    Frequency of Communication with Friends and Family: Twice a week    Frequency of Social Gatherings with Friends and Family: Three times a week    Attends Religious Services: Never    Active Member of Clubs or Organizations: Yes    Attends Engineer, structural: More than 4 times per year    Marital Status: Married    Tobacco Counseling Counseling given: Not Answered   Clinical Intake:  Pre-visit preparation completed: Yes  Pain : 0-10 Pain Score: 4  Pain Type: Chronic pain Pain Location: Shoulder Pain Descriptors / Indicators: Burning, Aching, Dull Pain  Onset: 1 to 4 weeks ago Pain Frequency: Intermittent     Nutritional Risks: None Diabetes: Yes CBG done?: No Did pt. bring in CBG monitor from home?: No     Diabetic? Yes  Nutrition Risk Assessment:  Has the patient had any N/V/D within the last 2 months?  No  Does the patient have any non-healing wounds?  No  Has the patient had any unintentional weight loss or weight gain?  Yes   Diabetes:  Is the patient diabetic?  Yes  If diabetic, was a CBG obtained today?  No  Did the patient bring in their glucometer from home?  No  How often do you monitor your CBG's?  1 x a day.   Financial Strains and Diabetes Management:  Are you having any financial strains with the device, your supplies or your medication? No .  Does the patient want to be seen by Chronic Care Management for management of their diabetes?  No  Would the patient like to be referred to a Nutritionist or for Diabetic Management?  No   Diabetic Exams:  Diabetic Eye Exam: Completed .Pt has been advised about the importance in completing this exam. A referral has been placed today. Message sent to referral coordinator for scheduling purposes. Advised pt to expect a call from office referred to regarding appt.  Diabetic Foot Exam: . Pt has been advised about the importance in completing this exam.   Interpreter Needed?: No  Information entered by :: Remi Haggard LPN A  Subjective:   Gregory Harrison is a 72 y.o. male who presents for Medicare Annual/Subsequent preventive examination.  Visit Complete: Virtual  I connected with  [name] on 08/06/23 by a audio enabled telemedicine application and verified that I am speaking with the correct person using two identifiers.  Patient Location: Home  Provider Location: Home Office  I discussed the limitations of evaluation and management  by telemedicine. The patient expressed understanding and agreed to proceed.  Vital Signs: Unable to obtain new vitals due to  this being a telehealth visit.   Review of Systems     Cardiac Risk Factors include: advanced age (>53men, >58 women);diabetes mellitus;male gender;hypertension     Objective:    Today's Vitals   05/08/23 1111  PainSc: 4    There is no height or weight on file to calculate BMI.     05/08/2023   11:14 AM 03/31/2023    1:33 PM 01/07/2023    9:49 AM 12/27/2022    9:31 AM 11/11/2022    8:59 AM 10/08/2022    2:11 PM 08/08/2022   10:26 AM  Advanced Directives  Does Patient Have a Medical Advance Directive? No No Unable to assess, patient is non-responsive or altered mental status No No No No  Would patient like information on creating a medical advance directive?  No - Patient declined  No - Patient declined No - Patient declined No - Patient declined No - Patient declined    Current Medications (verified) Outpatient Encounter Medications as of 05/08/2023  Medication Sig   Alcohol Swabs PADS Use with insulin   amLODipine (NORVASC) 2.5 MG tablet Take 1 tablet (2.5 mg total) by mouth at bedtime.   atorvastatin (LIPITOR) 40 MG tablet TAKE 1 TABLET BY MOUTH EVERY DAY   blood glucose meter kit and supplies Dispense based on patient and insurance preference. Use up to four times daily as directed. (FOR ICD-10 E10.9, E11.9).   Blood Glucose Monitoring Suppl (ONETOUCH VERIO REFLECT) w/Device KIT CHECK BLOOD GLUCOSE TWICE DAILY   Blood Glucose Monitoring Suppl (ONETOUCH VERIO) w/Device KIT 1 each by Does not apply route as directed.   Emollient (AQUAPHOR ADVANCED THERAPY EX) Apply 1 Application topically 2 (two) times daily.   fluticasone (FLONASE) 50 MCG/ACT nasal spray Place 1 spray into both nostrils daily as needed for allergies or rhinitis.   gabapentin (NEURONTIN) 400 MG capsule Take 3 capsules (1,200 mg total) by mouth at bedtime.   glucose blood (ONETOUCH VERIO) test strip Use to check blood sugar 3x per day. E11.9   hydrochlorothiazide (HYDRODIURIL) 12.5 MG tablet TAKE 1 TABLET BY MOUTH  EVERY DAY   Insulin Glargine (BASAGLAR KWIKPEN) 100 UNIT/ML Inject 34 Units into the skin daily. (Patient taking differently: Inject 39 Units into the skin daily.)   Insulin Pen Needle 31G X 8 MM MISC Check blood sugars twice a day   Lancet Devices (ONE TOUCH DELICA LANCING DEV) MISC 1 each by Does not apply route as directed.   losartan (COZAAR) 25 MG tablet TAKE 1 TABLET BY MOUTH EVERY DAY   meloxicam (MOBIC) 7.5 MG tablet TAKE 1 TABLET BY MOUTH EVERY DAY AS NEEDED FOR PAIN   metFORMIN (GLUCOPHAGE-XR) 500 MG 24 hr tablet TAKE 2 TABLETS BY MOUTH TWICE A DAY   ondansetron (ZOFRAN-ODT) 8 MG disintegrating tablet TAKE 1 TABLET BY MOUTH EVERY 8 HOURS AS NEEDED FOR NAUSEA OR VOMITING.   ONETOUCH DELICA LANCETS FINE MISC 1 each by Does not apply route 3 (three) times daily.   prochlorperazine (COMPAZINE) 10 MG tablet Take 1 tablet (10 mg total) by mouth every 6 (six) hours as needed for nausea or vomiting.   [DISCONTINUED] HYDROcodone-acetaminophen (NORCO/VICODIN) 5-325 MG tablet Take 1 tablet by mouth daily as needed for moderate pain. For chronic pain syndrome   [DISCONTINUED] TRULICITY 0.75 MG/0.5ML SOPN INJECT 0.75 MG INTO THE SKIN ONCE A WEEK   empagliflozin (JARDIANCE) 25  MG TABS tablet Take 25 mg by mouth daily.   No facility-administered encounter medications on file as of 05/08/2023.    Allergies (verified) Patient has no known allergies.   History: Past Medical History:  Diagnosis Date   Arthritis    2010   Cancer (HCC) 2023   L hand- skin cancer   Diabetes (HCC) 2000   Diabetic neuropathy (HCC)    GERD (gastroesophageal reflux disease)    HTN (hypertension) 2010   Hypercholesterolemia    Solitary pulmonary nodule on lung CT 08/19/2022   Chest CT 08/15/22: 9 mm LUL nodule with spiculated margins concerning for bronchogenic carcinoma.   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 85, blurry vision   Weight loss 07/16/2019   Past Surgical History:  Procedure Laterality Date    CHOLECYSTECTOMY N/A 10/16/2022   Procedure: LAPAROSCOPIC CHOLECYSTECTOMY;  Surgeon: Fritzi Mandes, MD;  Location: [facility] OR;  Service: General;  Laterality: N/A;   EYE SURGERY Left 1990s   " to peel off a growth"   HAND SURGERY  1980s   R hand pointer finger    NOSE SURGERY  1979   after broke his nose   TONSILLECTOMY     at age 24   Family History  Problem Relation Age of Onset   Heart attack Father        died in 12, had open surgery   Heart murmur Father    Other Mother        bladder taken out for unknown reason. unspecified brain tumor   ALS Brother    Diabetes Paternal Grandfather    Colon cancer Neg Hx    Colon polyps Neg Hx    Esophageal cancer Neg Hx    Rectal cancer Neg Hx    Stomach cancer Neg Hx    Social History   Socioeconomic History   Marital status: Married    Spouse name: Kazuto Marinez   Number of children: 2   Years of education: 12   Highest education level: High school graduate  Occupational History   Occupation: retired  Tobacco Use   Smoking status: Former    Current packs/day: 0.00    Average packs/day: 2.0 packs/day for 20.0 years (40.0 ttl pk-yrs)    Types: Cigarettes    Start date: 1    Quit date: 1989    Years since quitting: 35.6   Smokeless tobacco: Never  Vaping Use   Vaping status: Never Used  Substance and Sexual Activity   Alcohol use: Not Currently   Drug use: Not Currently    Types: Marijuana   Sexual activity: Yes    Partners: Female  Other Topics Concern   Not on file  Social History Narrative   Patient lives with his wife, daughter and her 2 children.    Patient enjoys hunting and fishing and being outside.    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    Patient has 2 dogs and 1 cat.    Social Determinants of Health   Financial Resource Strain: Low Risk  (05/08/2023)   Overall Financial Resource Strain (CARDIA)    Difficulty of Paying Living Expenses: Not hard at all   Food Insecurity: No Food Insecurity (05/08/2023)   Hunger Vital Sign    Worried About Running Out of Food in the Last Year: Never true    Ran Out of Food in the Last Year: Never true  Transportation Needs:  No Transportation Needs (05/08/2023)   PRAPARE - Administrator, Civil Service (Medical): No    Lack of Transportation (Non-Medical): No  Physical Activity: Sufficiently Active (05/08/2023)   Exercise Vital Sign    Days of Exercise per Week: 4 days    Minutes of Exercise per Session: 40 min  Stress: No Stress Concern Present (05/08/2023)   [employer] of Occupational Health - Occupational Stress Questionnaire    Feeling of Stress : Not at all  Social Connections: Moderately Integrated (05/08/2023)   Social Connection and Isolation Panel [NHANES]    Frequency of Communication with Friends and Family: Twice a week    Frequency of Social Gatherings with Friends and Family: Three times a week    Attends Religious Services: Never    Active Member of Clubs or Organizations: Yes    Attends Engineer, structural: More than 4 times per year    Marital Status: Married    Tobacco Counseling Counseling given: Not Answered   Clinical Intake:  Pre-visit preparation completed: Yes  Pain : 0-10 Pain Score: 4  Pain Type: Chronic pain Pain Location: Shoulder Pain Descriptors / Indicators: Burning, Aching, Dull Pain Onset: 1 to 4 weeks ago Pain Frequency: Intermittent     Nutritional Risks: None Diabetes: Yes CBG done?: No Did pt. bring in CBG monitor from home?: No     Interpreter Needed?: No  Information entered by :: Remi Haggard LPN   Activities of Daily Living    05/08/2023   11:16 AM 10/08/2022    2:15 PM  In your present state of health, do you have any difficulty performing the following activities:  Hearing? 1   Vision? 0   Difficulty concentrating or making decisions? 0   Walking or climbing stairs? 0   Dressing or bathing? 0   Doing  errands, shopping? 0 0  Preparing Food and eating ? N   Using the Toilet? N   In the past six months, have you accidently leaked urine? N   Do you have problems with loss of bowel control? N   Managing your Medications? N   Managing your Finances? N     Patient Care Team: Darral Dash, DO as PCP - General (Family Medicine) Raymondo Band as Physician Assistant (Hematology and Oncology) Marily Lente, RN as Oncology Nurse Navigator (Oncology)  Indicate any recent Medical Services you may have received from other than [facility] providers in the past year (date may be approximate).     Assessment:   This is a routine wellness examination for [facility].  Hearing/Vision screen Hearing Screening - Comments:: Has trouble hearing Does not wear hearing aids Has in the past Vision Screening - Comments:: Up to date [facility]  Dietary issues and exercise activities discussed: Current Exercise Habits: The patient does not participate in regular exercise at present;The patient has a physically strenuous job, but has no regular exercise apart from work.   Goals Addressed             This Visit's Progress    DIET - INCREASE WATER INTAKE   Not on track    Patient drinks 3 diet pepsi per day.      DIET - INCREASE WATER INTAKE         Depression Screen    07/23/2023   11:24 AM 05/08/2023    4:37 PM 03/31/2023    1:33 PM 03/27/2023    9:51 AM 01/07/2023    9:49 AM  12/27/2022    9:35 AM 11/11/2022    8:59 AM  PHQ 2/9 Scores  PHQ - 2 Score 0 0 0 0 0 0 0  PHQ- 9 Score 0 0 1  0 0 0    Fall Risk    05/08/2023   11:15 AM 03/31/2023    1:39 PM 03/31/2023    1:34 PM 03/27/2023    9:51 AM 01/07/2023    9:49 AM  Fall Risk   Falls in the past year? 1 0 0 0   Number falls in past yr: 1    1  Injury with Fall? 0    0  Risk for fall due to : History of fall(s)      Follow up Falls evaluation completed;Education provided;Falls prevention discussed        MEDICARE RISK AT  HOME: Medicare Risk at Home Any stairs in or around the home?: No If so, are there any without handrails?: No Home free of loose throw rugs in walkways, pet beds, electrical cords, etc?: Yes Adequate lighting in your home to reduce risk of falls?: Yes Life alert?: No Use of a cane, walker or w/c?: No Grab bars in the bathroom?: No Shower chair or bench in shower?: No Elevated toilet seat or a handicapped toilet?: No  TIMED UP AND GO:  Was the test performed?  No    Cognitive Function:        05/08/2023   11:16 AM 01/07/2020    9:50 AM  6CIT Screen  What Year? 0 points 0 points  What month? 0 points 0 points  What time? 0 points 0 points  Count back from 20 0 points 2 points  Months in reverse 0 points 0 points  Repeat phrase 0 points 0 points  Total Score 0 points 2 points    Immunizations Immunization History  Administered Date(s) Administered   COVID-19, mRNA, vaccine(Comirnaty)12 years and older 10/24/2022   Fluad Quad(high Dose 65+) 08/16/2019, 09/12/2021   Influenza,inj,Quad PF,6+ Mos 10/01/2018, 10/03/2020   Influenza,inj,quad, With Preservative 09/09/2017   Influenza-Unspecified 08/10/2019, 10/24/2022   PFIZER(Purple Top)SARS-COV-2 Vaccination 01/02/2020, 01/24/2020, 10/03/2020   Pfizer Covid-19 Vaccine Bivalent Booster 56yrs & up 09/12/2021   Pneumococcal Conjugate-13 01/11/2019   Pneumococcal Polysaccharide-23 10/19/2020   Unspecified SARS-COV-2 Vaccination 10/24/2022    TDAP status: Due, Education has been provided regarding the importance of this vaccine. Advised may receive this vaccine at local pharmacy or Health Dept. Aware to provide a copy of the vaccination record if obtained from local pharmacy or Health Dept. Verbalized acceptance and understanding.  Flu Vaccine status: Due, Education has been provided regarding the importance of this vaccine. Advised may receive this vaccine at local pharmacy or Health Dept. Aware to provide a copy of the  vaccination record if obtained from local pharmacy or Health Dept. Verbalized acceptance and understanding.  Pneumococcal vaccine status: Up to date  Covid-19 vaccine status: Information provided on how to obtain vaccines.   Qualifies for Shingles Vaccine? Yes   Zostavax completed No   Shingrix Completed?: No.    Education has been provided regarding the importance of this vaccine. Patient has been advised to call insurance company to determine out of pocket expense if they have not yet received this vaccine. Advised may also receive vaccine at local pharmacy or Health Dept. Verbalized acceptance and understanding.  Screening Tests Health Maintenance  Topic Date Due   Diabetic kidney evaluation - Urine ACR  03/10/2019   FOOT EXAM  02/13/2022  OPHTHALMOLOGY EXAM  07/06/2023   INFLUENZA VACCINE  07/10/2023   Zoster Vaccines- Shingrix (1 of 2) 08/08/2023 (Originally 06/09/1999)   COVID-19 Vaccine (5 - 2023-24 season) 08/15/2023 (Originally 02/22/2023)   Colonoscopy  08/05/2024 (Originally 09/05/2021)   HEMOGLOBIN A1C  09/30/2023   Diabetic kidney evaluation - eGFR measurement  03/30/2024   Medicare Annual Wellness (AWV)  05/07/2024   Pneumonia Vaccine 46+ Years old  Completed   Hepatitis C Screening  Completed   HPV VACCINES  Aged Out   DTaP/Tdap/Td  Discontinued    Health Maintenance  Health Maintenance Due  Topic Date Due   Diabetic kidney evaluation - Urine ACR  03/10/2019   FOOT EXAM  02/13/2022   OPHTHALMOLOGY EXAM  07/06/2023   INFLUENZA VACCINE  07/10/2023    Colonoscopy   Education provided  Lung Cancer Screening: (Low Dose CT Chest recommended if Age 74-80 years, 20 pack-year currently smoking OR have quit w/in 15years.) qualify.   Lung Cancer Screening Referral:   Additional Screening:  Hepatitis C Screening: does not qualify; Completed 2020  Vision Screening: Recommended annual ophthalmology exams for early detection of glaucoma and other disorders of the  eye. Is the patient up to date with their annual eye exam?  Yes  Who is the provider or what is the name of the office in which the patient attends annual eye exams? [facility] If pt is not established with a provider, would they like to be referred to a provider to establish care? No .   Dental Screening: Recommended annual dental exams for proper oral hygiene    Community Resource Referral / Chronic Care Management: CRR required this visit?  No   CCM required this visit?  No     Plan:     I have personally reviewed and noted the following in the patient's chart:   Medical and social history Use of alcohol, tobacco or illicit drugs  Current medications and supplements including opioid prescriptions. Patient is not currently taking opioid prescriptions. Functional ability and status Nutritional status Physical activity Advanced directives List of other physicians Hospitalizations, surgeries, and ER visits in previous 12 months Vitals Screenings to include cognitive, depression, and falls Referrals and appointments  In addition, I have reviewed and discussed with patient certain preventive protocols, quality metrics, and best practice recommendations. A written personalized care plan for preventive services as well as general preventive health recommendations were provided to patient.     Remi Haggard, LPN   [id number]   After Visit Summary: (MyChart) Due to this being a telephonic visit, the after visit summary with patients personalized plan was offered to patient via MyChart   Nurse Notes:

## 2023-08-06 NOTE — Addendum Note (Signed)
Addended by: Laurey Arrow on: 08/06/2023 09:35 AM   Modules accepted: Orders

## 2023-08-06 NOTE — Progress Notes (Signed)
A  Subjective:   Gregory Harrison is a 72 y.o. male who presents for Medicare Annual/Subsequent preventive examination.  Visit Complete: Virtual  I connected with  Gregory Harrison on 05-08-2023 by a audio enabled telemedicine application and verified that I am speaking with the correct person using two identifiers.  Patient Location: Home  Provider Location: Home Office  I discussed the limitations of evaluation and management by telemedicine. The patient expressed understanding and agreed to proceed.  Vital Signs: Unable to obtain new vitals due to this being a telehealth visit.   Review of Systems     Cardiac Risk Factors include: advanced age (>20men, >1 women);diabetes mellitus;male gender;hypertension     Objective:    Today's Vitals   05/08/23 1111  PainSc: 4    There is no height or weight on file to calculate BMI.     05/08/2023   11:14 AM 03/31/2023    1:33 PM 01/07/2023    9:49 AM 12/27/2022    9:31 AM 11/11/2022    8:59 AM 10/08/2022    2:11 PM 08/08/2022   10:26 AM  Advanced Directives  Does Patient Have a Medical Advance Directive? No No Unable to assess, patient is non-responsive or altered mental status No No No No  Would patient like information on creating a medical advance directive?  No - Patient declined  No - Patient declined No - Patient declined No - Patient declined No - Patient declined    Current Medications (verified) Outpatient Encounter Medications as of 05/08/2023  Medication Sig   Alcohol Swabs PADS Use with insulin   amLODipine (NORVASC) 2.5 MG tablet Take 1 tablet (2.5 mg total) by mouth at bedtime.   atorvastatin (LIPITOR) 40 MG tablet TAKE 1 TABLET BY MOUTH EVERY DAY   blood glucose meter kit and supplies Dispense based on patient and insurance preference. Use up to four times daily as directed. (FOR ICD-10 E10.9, E11.9).   Blood Glucose Monitoring Suppl (ONETOUCH VERIO REFLECT) w/Device KIT CHECK BLOOD GLUCOSE TWICE DAILY   Blood Glucose  Monitoring Suppl (ONETOUCH VERIO) w/Device KIT 1 each by Does not apply route as directed.   Emollient (AQUAPHOR ADVANCED THERAPY EX) Apply 1 Application topically 2 (two) times daily.   fluticasone (FLONASE) 50 MCG/ACT nasal spray Place 1 spray into both nostrils daily as needed for allergies or rhinitis.   gabapentin (NEURONTIN) 400 MG capsule Take 3 capsules (1,200 mg total) by mouth at bedtime.   glucose blood (ONETOUCH VERIO) test strip Use to check blood sugar 3x per day. E11.9   hydrochlorothiazide (HYDRODIURIL) 12.5 MG tablet TAKE 1 TABLET BY MOUTH EVERY DAY   Insulin Glargine (BASAGLAR KWIKPEN) 100 UNIT/ML Inject 34 Units into the skin daily. (Patient taking differently: Inject 39 Units into the skin daily.)   Insulin Pen Needle 31G X 8 MM MISC Check blood sugars twice a day   Lancet Devices (ONE TOUCH DELICA LANCING DEV) MISC 1 each by Does not apply route as directed.   losartan (COZAAR) 25 MG tablet TAKE 1 TABLET BY MOUTH EVERY DAY   meloxicam (MOBIC) 7.5 MG tablet TAKE 1 TABLET BY MOUTH EVERY DAY AS NEEDED FOR PAIN   metFORMIN (GLUCOPHAGE-XR) 500 MG 24 hr tablet TAKE 2 TABLETS BY MOUTH TWICE A DAY   ondansetron (ZOFRAN-ODT) 8 MG disintegrating tablet TAKE 1 TABLET BY MOUTH EVERY 8 HOURS AS NEEDED FOR NAUSEA OR VOMITING.   ONETOUCH DELICA LANCETS FINE MISC 1 each by Does not apply route 3 (three) times  daily.   prochlorperazine (COMPAZINE) 10 MG tablet Take 1 tablet (10 mg total) by mouth every 6 (six) hours as needed for nausea or vomiting.   [DISCONTINUED] HYDROcodone-acetaminophen (NORCO/VICODIN) 5-325 MG tablet Take 1 tablet by mouth daily as needed for moderate pain. For chronic pain syndrome   [DISCONTINUED] TRULICITY 0.75 MG/0.5ML SOPN INJECT 0.75 MG INTO THE SKIN ONCE A WEEK   empagliflozin (JARDIANCE) 25 MG TABS tablet Take 25 mg by mouth daily.   No facility-administered encounter medications on file as of 05/08/2023.    Allergies (verified) Patient has no known allergies.    History: Past Medical History:  Diagnosis Date   Arthritis    2010   Cancer (HCC) 2023   L hand- skin cancer   Diabetes (HCC) 2000   Diabetic neuropathy (HCC)    GERD (gastroesophageal reflux disease)    HTN (hypertension) 2010   Hypercholesterolemia    Solitary pulmonary nodule on lung CT 08/19/2022   Chest CT 08/15/22: 9 mm LUL nodule with spiculated margins concerning for bronchogenic carcinoma.   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 35, blurry vision   Weight loss 07/16/2019   Past Surgical History:  Procedure Laterality Date   CHOLECYSTECTOMY N/A 10/16/2022   Procedure: LAPAROSCOPIC CHOLECYSTECTOMY;  Surgeon: Fritzi Mandes, MD;  Location: [facility] OR;  Service: General;  Laterality: N/A;   EYE SURGERY Left 1990s   " to peel off a growth"   HAND SURGERY  1980s   R hand pointer finger    NOSE SURGERY  1979   after broke his nose   TONSILLECTOMY     at age 12   Family History  Problem Relation Age of Onset   Heart attack Father        died in 32, had open surgery   Heart murmur Father    Other Mother        bladder taken out for unknown reason. unspecified brain tumor   ALS Brother    Diabetes Paternal Grandfather    Colon cancer Neg Hx    Colon polyps Neg Hx    Esophageal cancer Neg Hx    Rectal cancer Neg Hx    Stomach cancer Neg Hx    Social History   Socioeconomic History   Marital status: Married    Spouse name: Jaewon Winzer   Number of children: 2   Years of education: 12   Highest education level: High school graduate  Occupational History   Occupation: retired  Tobacco Use   Smoking status: Former    Current packs/day: 0.00    Average packs/day: 2.0 packs/day for 20.0 years (40.0 ttl pk-yrs)    Types: Cigarettes    Start date: 76    Quit date: 1989    Years since quitting: 35.6   Smokeless tobacco: Never  Vaping Use   Vaping status: Never Used  Substance and Sexual Activity   Alcohol use: Not Currently   Drug use: Not Currently     Types: Marijuana   Sexual activity: Yes    Partners: Female  Other Topics Concern   Not on file  Social History Narrative   Patient lives with his wife, daughter and her 2 children.    Patient enjoys hunting and fishing and being outside.    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    Patient has 2 dogs and 1 cat.    Social Determinants of Health  Financial Resource Strain: Low Risk  (05/08/2023)   Overall Financial Resource Strain (CARDIA)    Difficulty of Paying Living Expenses: Not hard at all  Food Insecurity: No Food Insecurity (05/08/2023)   Hunger Vital Sign    Worried About Running Out of Food in the Last Year: Never true    Ran Out of Food in the Last Year: Never true  Transportation Needs: No Transportation Needs (05/08/2023)   PRAPARE - Administrator, Civil Service (Medical): No    Lack of Transportation (Non-Medical): No  Physical Activity: Sufficiently Active (05/08/2023)   Exercise Vital Sign    Days of Exercise per Week: 4 days    Minutes of Exercise per Session: 40 min  Stress: No Stress Concern Present (05/08/2023)   [employer] of Occupational Health - Occupational Stress Questionnaire    Feeling of Stress : Not at all  Social Connections: Moderately Integrated (05/08/2023)   Social Connection and Isolation Panel [NHANES]    Frequency of Communication with Friends and Family: Twice a week    Frequency of Social Gatherings with Friends and Family: Three times a week    Attends Religious Services: Never    Active Member of Clubs or Organizations: Yes    Attends Engineer, structural: More than 4 times per year    Marital Status: Married    Tobacco Counseling Counseling given: Not Answered   Clinical Intake:  Pre-visit preparation completed: Yes  Pain : 0-10 Pain Score: 4  Pain Type: Chronic pain Pain Location: Shoulder Pain Descriptors / Indicators: Burning, Aching, Dull Pain  Onset: 1 to 4 weeks ago Pain Frequency: Intermittent     Nutritional Risks: None Diabetes: Yes CBG done?: No Did pt. bring in CBG monitor from home?: No     Interpreter Needed?: No  Information entered by :: Remi Haggard LPN   Activities of Daily Living    05/08/2023   11:16 AM 10/08/2022    2:15 PM  In your present state of health, do you have any difficulty performing the following activities:  Hearing? 1   Vision? 0   Difficulty concentrating or making decisions? 0   Walking or climbing stairs? 0   Dressing or bathing? 0   Doing errands, shopping? 0 0  Preparing Food and eating ? N   Using the Toilet? N   In the past six months, have you accidently leaked urine? N   Do you have problems with loss of bowel control? N   Managing your Medications? N   Managing your Finances? N     Patient Care Team: Darral Dash, DO as PCP - General (Family Medicine) Raymondo Band as Physician Assistant (Hematology and Oncology) Marily Lente, RN as Oncology Nurse Navigator (Oncology)  Indicate any recent Medical Services you may have received from other than [facility] providers in the past year (date may be approximate).     Assessment:   This is a routine wellness examination for [facility].  Hearing/Vision screen Hearing Screening - Comments:: Has trouble hearing Does not wear hearing aids Has in the past Vision Screening - Comments:: Up to date [facility]  Dietary issues and exercise activities discussed: Current Exercise Habits: The patient does not participate in regular exercise at present;The patient has a physically strenuous job, but has no regular exercise apart from work.   Goals Addressed             This Visit's Progress    DIET - INCREASE  WATER INTAKE   Not on track    Patient drinks 3 diet pepsi per day.      DIET - INCREASE WATER INTAKE         Depression Screen    07/23/2023   11:24 AM 05/08/2023    4:37 PM 03/31/2023    1:33 PM 03/27/2023     9:51 AM 01/07/2023    9:49 AM 12/27/2022    9:35 AM 11/11/2022    8:59 AM  PHQ 2/9 Scores  PHQ - 2 Score 0 0 0 0 0 0 0  PHQ- 9 Score 0 0 1  0 0 0    Fall Risk    05/08/2023   11:15 AM 03/31/2023    1:39 PM 03/31/2023    1:34 PM 03/27/2023    9:51 AM 01/07/2023    9:49 AM  Fall Risk   Falls in the past year? 1 0 0 0   Number falls in past yr: 1    1  Injury with Fall? 0    0  Risk for fall due to : History of fall(s)      Follow up Falls evaluation completed;Education provided;Falls prevention discussed        MEDICARE RISK AT HOME: Medicare Risk at Home Any stairs in or around the home?: No If so, are there any without handrails?: No Home free of loose throw rugs in walkways, pet beds, electrical cords, etc?: Yes Adequate lighting in your home to reduce risk of falls?: Yes Life alert?: No Use of a cane, walker or w/c?: No Grab bars in the bathroom?: No Shower chair or bench in shower?: No Elevated toilet seat or a handicapped toilet?: No  TIMED UP AND GO:  Was the test performed?  No    Cognitive Function:        05/08/2023   11:16 AM 01/07/2020    9:50 AM  6CIT Screen  What Year? 0 points 0 points  What month? 0 points 0 points  What time? 0 points 0 points  Count back from 20 0 points 2 points  Months in reverse 0 points 0 points  Repeat phrase 0 points 0 points  Total Score 0 points 2 points    Immunizations Immunization History  Administered Date(s) Administered   COVID-19, mRNA, vaccine(Comirnaty)12 years and older 10/24/2022   Fluad Quad(high Dose 65+) 08/16/2019, 09/12/2021   Influenza,inj,Quad PF,6+ Mos 10/01/2018, 10/03/2020   Influenza,inj,quad, With Preservative 09/09/2017   Influenza-Unspecified 08/10/2019, 10/24/2022   PFIZER(Purple Top)SARS-COV-2 Vaccination 01/02/2020, 01/24/2020, 10/03/2020   Pfizer Covid-19 Vaccine Bivalent Booster 44yrs & up 09/12/2021   Pneumococcal Conjugate-13 01/11/2019   Pneumococcal Polysaccharide-23 10/19/2020    Unspecified SARS-COV-2 Vaccination 10/24/2022    TDAP status: Due, Education has been provided regarding the importance of this vaccine. Advised may receive this vaccine at local pharmacy or Health Dept. Aware to provide a copy of the vaccination record if obtained from local pharmacy or Health Dept. Verbalized acceptance and understanding.  Flu Vaccine status: Due, Education has been provided regarding the importance of this vaccine. Advised may receive this vaccine at local pharmacy or Health Dept. Aware to provide a copy of the vaccination record if obtained from local pharmacy or Health Dept. Verbalized acceptance and understanding.  Pneumococcal vaccine status: Up to date  Covid-19 vaccine status: Information provided on how to obtain vaccines.   Qualifies for Shingles Vaccine? Yes   Zostavax completed No   Shingrix Completed?: No.    Education has been provided  regarding the importance of this vaccine. Patient has been advised to call insurance company to determine out of pocket expense if they have not yet received this vaccine. Advised may also receive vaccine at local pharmacy or Health Dept. Verbalized acceptance and understanding.  Screening Tests Health Maintenance  Topic Date Due   Diabetic kidney evaluation - Urine ACR  03/10/2019   FOOT EXAM  02/13/2022   OPHTHALMOLOGY EXAM  07/06/2023   INFLUENZA VACCINE  07/10/2023   Zoster Vaccines- Shingrix (1 of 2) 08/08/2023 (Originally 06/09/1999)   COVID-19 Vaccine (5 - 2023-24 season) 08/15/2023 (Originally 02/22/2023)   Colonoscopy  08/05/2024 (Originally 09/05/2021)   HEMOGLOBIN A1C  09/30/2023   Diabetic kidney evaluation - eGFR measurement  03/30/2024   Medicare Annual Wellness (AWV)  05/07/2024   Pneumonia Vaccine 2+ Years old  Completed   Hepatitis C Screening  Completed   HPV VACCINES  Aged Out   DTaP/Tdap/Td  Discontinued    Health Maintenance  Health Maintenance Due  Topic Date Due   Diabetic kidney evaluation -  Urine ACR  03/10/2019   FOOT EXAM  02/13/2022   OPHTHALMOLOGY EXAM  07/06/2023   INFLUENZA VACCINE  07/10/2023    Colonoscopy   Education provided  Lung Cancer Screening: (Low Dose CT Chest recommended if Age 27-80 years, 20 pack-year currently smoking OR have quit w/in 15years.) qualify.   Lung Cancer Screening Referral:   Additional Screening:  Hepatitis C Screening: does not qualify; Completed 2020  Vision Screening: Recommended annual ophthalmology exams for early detection of glaucoma and other disorders of the eye. Is the patient up to date with their annual eye exam?  Yes  Who is the provider or what is the name of the office in which the patient attends annual eye exams? [facility] If pt is not established with a provider, would they like to be referred to a provider to establish care? No .   Dental Screening: Recommended annual dental exams for proper oral hygiene    Community Resource Referral / Chronic Care Management: CRR required this visit?  No   CCM required this visit?  No     Plan:     I have personally reviewed and noted the following in the patient's chart:   Medical and social history Use of alcohol, tobacco or illicit drugs  Current medications and supplements including opioid prescriptions. Patient is not currently taking opioid prescriptions. Functional ability and status Nutritional status Physical activity Advanced directives List of other physicians Hospitalizations, surgeries, and ER visits in previous 12 months Vitals Screenings to include cognitive, depression, and falls Referrals and appointments  In addition, I have reviewed and discussed with patient certain preventive protocols, quality metrics, and best practice recommendations. A written personalized care plan for preventive services as well as general preventive health recommendations were provided to patient.     Remi Haggard, LPN   [phone]  After Visit Summary:  (MyChart) Due to this being a telephonic visit, the after visit summary with patients personalized plan was offered to patient via MyChart   Nurse Notes:

## 2023-08-07 ENCOUNTER — Ambulatory Visit (INDEPENDENT_AMBULATORY_CARE_PROVIDER_SITE_OTHER): Payer: PPO | Admitting: Student

## 2023-08-07 ENCOUNTER — Encounter: Payer: Self-pay | Admitting: Student

## 2023-08-07 VITALS — BP 177/81 | HR 77 | Ht 74.5 in | Wt 174.0 lb

## 2023-08-07 DIAGNOSIS — I152 Hypertension secondary to endocrine disorders: Secondary | ICD-10-CM

## 2023-08-07 DIAGNOSIS — Z794 Long term (current) use of insulin: Secondary | ICD-10-CM

## 2023-08-07 DIAGNOSIS — E1142 Type 2 diabetes mellitus with diabetic polyneuropathy: Secondary | ICD-10-CM

## 2023-08-07 DIAGNOSIS — R634 Abnormal weight loss: Secondary | ICD-10-CM

## 2023-08-07 DIAGNOSIS — E1159 Type 2 diabetes mellitus with other circulatory complications: Secondary | ICD-10-CM

## 2023-08-07 DIAGNOSIS — E1165 Type 2 diabetes mellitus with hyperglycemia: Secondary | ICD-10-CM

## 2023-08-07 DIAGNOSIS — R911 Solitary pulmonary nodule: Secondary | ICD-10-CM

## 2023-08-07 LAB — POCT GLYCOSYLATED HEMOGLOBIN (HGB A1C): Hemoglobin A1C: 13 % — AB (ref 4.0–5.6)

## 2023-08-07 LAB — GLUCOSE, POCT (MANUAL RESULT ENTRY): POC Glucose: 301 mg/dl — AB (ref 70–99)

## 2023-08-07 NOTE — Progress Notes (Signed)
    SUBJECTIVE:   CHIEF COMPLAINT / HPI:   Gregory Harrison is a 72 year old male here to discuss weight loss, general feeling of unwellness.  He says he is lost about 10 pounds in the past couple months unintentionally.  He says he is eating "plenty of meals" but all he had for breakfast this morning was cantaloupe. He just generally does not feel well.  His home glucometer is not working well.  He has to prick his finger about 4 times before it works.  When he does get a reading, typically his blood sugars are 300s-599. He is able to tell me his regimen as follows-long-acting insulin 43 units/day, metformin 1000 mg twice daily, and he was taking Jardiance but is unable to afford it now.  Fasting CBGs are in the 100s  He has been having a cough with a lot of phlegm for the past couple months. He is not a smoker anymore.  He used to follow with oncology for concern for spiculated finding on CT chest concerning for bronchogenic carcinoma, but PET scan did not show any malignant activity.  Recommendation for repeat CT chest without contrast in 6 to 12 months, which I discussed with him today.  PERTINENT  PMH / PSH: Hypertension, type 2 diabetes,  OBJECTIVE:   BP (!) 177/81   Pulse 77   Ht 6' 2.5" (1.892 m)   Wt 174 lb (78.9 kg)   SpO2 100%   BMI 22.04 kg/m   General: Elderly, pleasant and in no distress Cardiac: Regular rate and rhythm Respiratory:  speaks in full sentences, normal work of breathing on room air Abdomen: Somewhat distended, but soft and nontender Extremities: Thin, appears somewhat cachectic from my prior exams Neuro: ANO x 4, some tangential speaking   ASSESSMENT/PLAN:   Hypertension associated with diabetes (HCC) Return for visit in 1 to 2 weeks to discuss blood pressure, other medical issues higher acuity today this visit Consider getting orthostatics at next visit given dizziness  Type 2 diabetes mellitus with hyperglycemia (HCC) Unfortunately, remains  extremely uncontrolled. A1c 13.0% today with CBG in the 300s. Significant muscle atrophy on exam today, much more pronounced from her prior visits.  His constellation of symptoms of general feeling of unwellness, dizziness, weakness are all exacerbated by his uncontrolled diabetes.  I stressed the importance of glycemic control, which patient understands. I provided ER return precautions should he have nausea, vomiting, altered mental status which would raise concern for DKA.  He is at his baseline mentation, ambulated independently into clinic and does not have any AMS today. Appointment scheduled with Dr. Raymondo Band for further assistance in glycemic control. For now, continue metformin and insulin.  Message sent to pharmacy technician regarding financial barrier to [place].   Weight loss, unintentional Chronic issue for the past year.  Likely exacerbated by uncontrolled T2DM. He is down almost 10 pounds from visit on 6/10.  Today, 174 pounds, on 6/183 pounds. Differential of course includes malignancy, chronic lung disease, medications. CT chest without contrast ordered today as well as HIV, CBC, BMP. Return in 1 to 2 weeks for follow-up  Darral Dash, DO [facility]

## 2023-08-07 NOTE — Patient Instructions (Signed)
It was great seeing you today.  As we discussed, -We are getting a CT scan of your chest to follow-up on the lung nodule that was previously seen.  Please go to your scheduled appointment time. -We scheduled an appointment with Dr. Raymondo Band so that we can get much better control of your diabetes.  In the meantime, please continue taking your insulin and metformin as prescribed.  I will look into why the [place] is no longer covered by your insurance. -Please try to eat 3 meals a day.  It should ideally have protein, carbohydrate and fat.  Please schedule follow-up appointment with me at the front desk before you leave today in 1 week.   If you have any questions or concerns, please feel free to call the clinic.   Have a wonderful day,  Dr. Darral Dash [facility] [phone]

## 2023-08-08 LAB — CBC WITH DIFFERENTIAL/PLATELET
Basophils Absolute: 0 10*3/uL (ref 0.0–0.2)
Basos: 0 %
EOS (ABSOLUTE): 0.1 10*3/uL (ref 0.0–0.4)
Eos: 2 %
Hematocrit: 39.1 % (ref 37.5–51.0)
Hemoglobin: 12.8 g/dL — ABNORMAL LOW (ref 13.0–17.7)
Immature Grans (Abs): 0 10*3/uL (ref 0.0–0.1)
Immature Granulocytes: 1 %
Lymphocytes Absolute: 1.7 10*3/uL (ref 0.7–3.1)
Lymphs: 22 %
MCH: 28.4 pg (ref 26.6–33.0)
MCHC: 32.7 g/dL (ref 31.5–35.7)
MCV: 87 fL (ref 79–97)
Monocytes Absolute: 0.5 10*3/uL (ref 0.1–0.9)
Monocytes: 7 %
Neutrophils Absolute: 5.4 10*3/uL (ref 1.4–7.0)
Neutrophils: 68 %
Platelets: 258 10*3/uL (ref 150–450)
RBC: 4.5 x10E6/uL (ref 4.14–5.80)
RDW: 12.9 % (ref 11.6–15.4)
WBC: 7.8 10*3/uL (ref 3.4–10.8)

## 2023-08-08 LAB — COMPREHENSIVE METABOLIC PANEL
ALT: 15 IU/L (ref 0–44)
AST: 12 IU/L (ref 0–40)
Albumin: 4.2 g/dL (ref 3.8–4.8)
Alkaline Phosphatase: 72 IU/L (ref 44–121)
BUN/Creatinine Ratio: 19 (ref 10–24)
BUN: 15 mg/dL (ref 8–27)
Bilirubin Total: 0.4 mg/dL (ref 0.0–1.2)
CO2: 25 mmol/L (ref 20–29)
Calcium: 10 mg/dL (ref 8.6–10.2)
Chloride: 100 mmol/L (ref 96–106)
Creatinine, Ser: 0.8 mg/dL (ref 0.76–1.27)
Globulin, Total: 2.1 g/dL (ref 1.5–4.5)
Glucose: 260 mg/dL — ABNORMAL HIGH (ref 70–99)
Potassium: 4 mmol/L (ref 3.5–5.2)
Sodium: 139 mmol/L (ref 134–144)
Total Protein: 6.3 g/dL (ref 6.0–8.5)
eGFR: 93 mL/min/{1.73_m2} (ref >59)

## 2023-08-08 LAB — TSH RFX ON ABNORMAL TO FREE T4: TSH: 1.71 u[IU]/mL (ref 0.450–4.500)

## 2023-08-08 LAB — HIV ANTIBODY (ROUTINE TESTING W REFLEX): HIV Screen 4th Generation wRfx: NONREACTIVE

## 2023-08-09 ENCOUNTER — Encounter: Payer: Self-pay | Admitting: Student

## 2023-08-09 NOTE — Assessment & Plan Note (Signed)
Unfortunately, remains extremely uncontrolled. A1c 13.0% today with CBG in the 300s. Significant muscle atrophy on exam today, much more pronounced from her prior visits.  His constellation of symptoms of general feeling of unwellness, dizziness, weakness are all exacerbated by his uncontrolled diabetes.  I stressed the importance of glycemic control, which patient understands. I provided ER return precautions should he have nausea, vomiting, altered mental status which would raise concern for DKA.  He is at his baseline mentation, ambulated independently into clinic and does not have any AMS today. Appointment scheduled with Dr. Raymondo Band for further assistance in glycemic control. For now, continue metformin and insulin.  Message sent to pharmacy technician regarding financial barrier to [place].

## 2023-08-09 NOTE — Assessment & Plan Note (Addendum)
Return for visit in 1 to 2 weeks to discuss blood pressure, other medical issues higher acuity today this visit Consider getting orthostatics at next visit given dizziness

## 2023-08-12 ENCOUNTER — Ambulatory Visit: Payer: PPO | Admitting: Pharmacist

## 2023-08-12 ENCOUNTER — Ambulatory Visit (AMBULATORY_SURGERY_CENTER): Payer: PPO

## 2023-08-12 ENCOUNTER — Encounter: Payer: Self-pay | Admitting: Gastroenterology

## 2023-08-12 VITALS — Ht 74.5 in | Wt 177.0 lb

## 2023-08-12 DIAGNOSIS — Z86010 Personal history of colonic polyps: Secondary | ICD-10-CM

## 2023-08-12 MED ORDER — PEG 3350-KCL-NA BICARB-NACL 420 G PO SOLR
4000.0000 mL | Freq: Once | ORAL | 0 refills | Status: AC
Start: 2023-08-12 — End: 2023-08-12

## 2023-08-12 NOTE — Progress Notes (Signed)
No egg or soy allergy known to patient  No issues known to pt with past sedation with any surgeries or procedures Patient denies ever being told they had issues or difficulty with intubation  No FH of Malignant Hyperthermia Pt is not on diet pills Pt is not on  home 02  Pt is not on blood thinners  Pt denies issues with constipation  No A fib or A flutter Have any cardiac testing pending--no  LOA: independent  Prep: [place]    Patient's chart reviewed by Cathlyn Parsons CNRA prior to previsit and patient appropriate for the [facility].  Previsit completed and red dot placed by patient's name on their procedure day (on provider's schedule).     PV competed with patient. Prep instructions sent to home address.

## 2023-08-13 ENCOUNTER — Ambulatory Visit (INDEPENDENT_AMBULATORY_CARE_PROVIDER_SITE_OTHER): Payer: PPO | Admitting: Pharmacist

## 2023-08-13 ENCOUNTER — Other Ambulatory Visit (HOSPITAL_COMMUNITY): Payer: Self-pay

## 2023-08-13 ENCOUNTER — Encounter: Payer: Self-pay | Admitting: Pharmacist

## 2023-08-13 VITALS — BP 163/96 | HR 67 | Ht 72.75 in | Wt 177.0 lb

## 2023-08-13 DIAGNOSIS — E1159 Type 2 diabetes mellitus with other circulatory complications: Secondary | ICD-10-CM

## 2023-08-13 DIAGNOSIS — E118 Type 2 diabetes mellitus with unspecified complications: Secondary | ICD-10-CM | POA: Diagnosis not present

## 2023-08-13 DIAGNOSIS — E1169 Type 2 diabetes mellitus with other specified complication: Secondary | ICD-10-CM | POA: Diagnosis not present

## 2023-08-13 DIAGNOSIS — I152 Hypertension secondary to endocrine disorders: Secondary | ICD-10-CM

## 2023-08-13 DIAGNOSIS — E785 Hyperlipidemia, unspecified: Secondary | ICD-10-CM

## 2023-08-13 DIAGNOSIS — E1165 Type 2 diabetes mellitus with hyperglycemia: Secondary | ICD-10-CM | POA: Diagnosis not present

## 2023-08-13 DIAGNOSIS — Z794 Long term (current) use of insulin: Secondary | ICD-10-CM

## 2023-08-13 MED ORDER — ONETOUCH VERIO VI STRP: ORAL_STRIP | 12 refills | Status: DC

## 2023-08-13 MED ORDER — ONETOUCH VERIO W/DEVICE KIT
1.0000 | PACK | 0 refills | Status: AC
Start: 2023-08-13 — End: ?

## 2023-08-13 MED ORDER — INSULIN LISPRO (1 UNIT DIAL) 100 UNIT/ML (KWIKPEN): 8.0000 [IU] | PEN_INJECTOR | Freq: Two times a day (BID) | SUBCUTANEOUS | 11 refills | Status: DC

## 2023-08-13 MED ORDER — BASAGLAR KWIKPEN 100 UNIT/ML ~~LOC~~ SOPN: 40.0000 [IU] | PEN_INJECTOR | Freq: Every day | SUBCUTANEOUS | Status: DC

## 2023-08-13 NOTE — Patient Instructions (Addendum)
It was nice to see you today!  Your goal blood sugar is 80-130 before eating and less than 180 after eating.  Medication Changes:  Restart amlodipine 2.5 mg once daily at bedtime.  Decrease Basaglar Kwikpen (insulin glargine) to 40 units once daily.  Start Humalog (insulin lispro) 8 units twice daily before meals.   Continue all other medication the same.   Monitor blood sugars in the morning and at bedtime at home and keep a log (glucometer or piece of paper) to bring with you to your next visit.   Keep up the good work with diet and exercise. Aim for a diet full of vegetables, fruit and lean meats (chicken, [place], fish). Try to limit salt intake by eating fresh or frozen vegetables (instead of canned), rinse canned vegetables prior to cooking and do not add any additional salt to meals.

## 2023-08-13 NOTE — Assessment & Plan Note (Signed)
Hypertension longstanding currently uncontrolled with office reading of 163/96 mm Hg . Blood pressure goal of <130/80 mmHg. Medication adherence and blood pressure control are suboptimal as patient reports forgetting to take amlodipine.  -Restarted amlodipine 2.5 mg once daily at bedtime.

## 2023-08-13 NOTE — Progress Notes (Signed)
S:     Chief Complaint  Patient presents with   Medication Management    Diabetes    72 y.o. male who presents for diabetes evaluation, education, and management. Patient arrives in good spirits and presents without any assistance.  Patient was referred and last seen by Primary Care Provider, Dr. Melissa Noon, on 08/07/23.   PMH is significant for HTN, T2DM.  At last visit with Dr. Melissa Noon, continued all medications as before.  Patient reports diabetes is longstanding with having started insulin ~ 2006.  Current diabetes medications include: Therapist, nutritional (insulin glargine) 34 units into the skin daily (patient reports taking differently 43 units daily), metformin 1000 mg twice daily Current hypertension medications include: amlodipine 2.5 mg once daily (patient reports forgetting to take this medication, has supply at home), hydrochlorothiazide 12.5 mg once daily, losartan 25 mg once daily Current hyperlipidemia medications include: atorvastatin 40 mg once daily  Patient reports adherence to taking all medications as prescribed besides amlodipine. Patient reports forgetting to take this medication and has supply at home.  Do you feel that your medications are working for you? Yes Have you been experiencing any side effects to the medications prescribed? No Do you have any problems obtaining medications due to transportation or finances? Yes - patient reports running out of Jardiance (empagliflozin) supply in May. Insurance coverage: [employer]  Patient denies hypoglycemic events.  Reported home blood sugars: 153-400 mg/dL  Patient denies nocturia (nighttime urination).  Patient reports neuropathy (nerve pain) in hands and feet. Arthritis may be contributor. Patient reports visual changes. States that he is seeing an eye doctor for a potential operation. Patient denies self foot exams.   O:   Review of Systems  All other systems reviewed and are  negative.   Physical Exam Constitutional:      Appearance: Normal appearance.  Neurological:     Mental Status: He is alert.  Psychiatric:        Mood and Affect: Mood normal.        Behavior: Behavior normal.        Thought Content: Thought content normal.        Judgment: Judgment normal.     Lab Results  Component Value Date   HGBA1C 13.0 (A) 08/07/2023   There were no vitals filed for this visit.  Lipid Panel     Component Value Date/Time   CHOL 101 03/31/2023 1601   TRIG 170 (H) 03/31/2023 1601   HDL 43 03/31/2023 1601   CHOLHDL 2.3 03/31/2023 1601   LDLCALC 30 03/31/2023 1601   LDLDIRECT 134 (H) 02/24/2018 1613    Clinical Atherosclerotic Cardiovascular Disease (ASCVD): No   A/P: Diabetes longstanding currently uncontrolled with an A1c of 13%. Patient reports running out of Jardiance (empagliflozin) supply in May during which A1c was 12.8%. Patient is able to verbalize appropriate hypoglycemia management plan. Medication adherence appears good. Control is suboptimal due to cost of medications. - Decreased Basaglar Kwikpen (insulin glargine) from 43 to 40 units into the skin daily. - Initiated Humalog (insulin lispro) 8 units twice daily before meals. - Continued metformin 1000 mg twice daily. -Patient educated on purpose, proper use, and potential adverse effects of.  -Extensively discussed pathophysiology of diabetes, recommended lifestyle interventions, dietary effects on blood sugar control.  -Patient requested new glucometer - new prescription provided.  -Counseled on s/sx of and management of hypoglycemia.  - Consider restart Jardiance (empagliflozin) at next visit.   ASCVD risk - primary prevention in patient  with diabetes. Last LDL (03/31/23) is 30 at goal of <70 mg/dL.  -Continued atorvastatin 40 mg once daily.  Hypertension longstanding currently uncontrolled with office reading of 163/96 mm Hg . Blood pressure goal of <130/80 mmHg. Medication adherence  and blood pressure control are suboptimal as patient reports forgetting to take amlodipine.  -Restarted amlodipine 2.5 mg once daily at bedtime.  Written patient instructions provided. Patient verbalized understanding of treatment plan.  Total time in face to face counseling 39 minutes.    Follow-up:  Pharmacist follow up 08/28/23 with Dr. Raymondo Band PCP clinic visit PRN Patient seen with Alesia Banda, PharmD Candidate and Andee Poles, PharmD Candidate.

## 2023-08-13 NOTE — Assessment & Plan Note (Signed)
ASCVD risk - primary prevention in patient with diabetes. Last LDL (03/31/23) is 30 at goal of <70 mg/dL.  -Continued atorvastatin 40 mg once daily.

## 2023-08-13 NOTE — Assessment & Plan Note (Signed)
Diabetes longstanding currently uncontrolled with an A1c of 13%. Patient reports running out of Jardiance (empagliflozin) supply in May during which A1c was 12.8%. Patient is able to verbalize appropriate hypoglycemia management plan. Medication adherence appears good. Control is suboptimal due to cost of medications. - Decreased Basaglar Kwikpen (insulin glargine) from 43 to 40 units into the skin daily. - Initiated Humalog (insulin lispro) 8 units twice daily before meals. - Continued metformin 1000 mg twice daily. -Patient educated on purpose, proper use, and potential adverse effects of.  -Extensively discussed pathophysiology of diabetes, recommended lifestyle interventions, dietary effects on blood sugar control.  -Patient requested new glucometer - new prescription provided.  -Counseled on s/sx of and management of hypoglycemia.

## 2023-08-14 ENCOUNTER — Telehealth: Payer: Self-pay | Admitting: Gastroenterology

## 2023-08-14 NOTE — Progress Notes (Signed)
Reviewed and agree with Dr Koval's plan.   

## 2023-08-14 NOTE — Telephone Encounter (Signed)
Pt started on Humulin 8 units 2 x day and New BP med once a day. Instructed pt that he is to take 1/2 the dose the evening before the procedure date and to take the BP med before the time he was instructed not to take anything by mouth. Pt states he understood and has no other questions at this time.

## 2023-08-14 NOTE — Telephone Encounter (Signed)
Inbound call from patient stating he has started new medications. Patient requesting a call back to discuss medications before 9/23 procedure. Please advise, thank you.

## 2023-08-19 ENCOUNTER — Ambulatory Visit
Payer: PPO | Attending: Student in an Organized Health Care Education/Training Program | Admitting: Student in an Organized Health Care Education/Training Program

## 2023-08-19 ENCOUNTER — Encounter: Payer: Self-pay | Admitting: Student in an Organized Health Care Education/Training Program

## 2023-08-19 VITALS — BP 180/72 | HR 71 | Temp 97.4°F | Resp 16 | Ht 74.0 in | Wt 177.0 lb

## 2023-08-19 DIAGNOSIS — G8929 Other chronic pain: Secondary | ICD-10-CM | POA: Diagnosis present

## 2023-08-19 DIAGNOSIS — M19012 Primary osteoarthritis, left shoulder: Secondary | ICD-10-CM | POA: Diagnosis present

## 2023-08-19 DIAGNOSIS — E1142 Type 2 diabetes mellitus with diabetic polyneuropathy: Secondary | ICD-10-CM | POA: Diagnosis present

## 2023-08-19 DIAGNOSIS — M47816 Spondylosis without myelopathy or radiculopathy, lumbar region: Secondary | ICD-10-CM | POA: Diagnosis present

## 2023-08-19 DIAGNOSIS — M25512 Pain in left shoulder: Secondary | ICD-10-CM | POA: Diagnosis present

## 2023-08-19 DIAGNOSIS — G894 Chronic pain syndrome: Secondary | ICD-10-CM | POA: Insufficient documentation

## 2023-08-19 DIAGNOSIS — Z794 Long term (current) use of insulin: Secondary | ICD-10-CM

## 2023-08-19 DIAGNOSIS — G5601 Carpal tunnel syndrome, right upper limb: Secondary | ICD-10-CM | POA: Insufficient documentation

## 2023-08-19 MED ORDER — HYDROCODONE-ACETAMINOPHEN 5-325 MG PO TABS: 1.0000 | ORAL_TABLET | Freq: Every day | ORAL | 0 refills | Status: DC | PRN

## 2023-08-19 MED ORDER — HYDROCODONE-ACETAMINOPHEN 5-325 MG PO TABS: 1.0000 | ORAL_TABLET | Freq: Every day | ORAL | 0 refills | Status: AC | PRN

## 2023-08-19 NOTE — Progress Notes (Signed)
PROVIDER NOTE: Information contained herein reflects review and annotations entered in association with encounter. Interpretation of such information and data should be left to medically-trained personnel. Information provided to patient can be located elsewhere in the medical record under "Patient Instructions". Document created using STT-dictation technology, any transcriptional errors that may result from process are unintentional.    Patient: Gregory Harrison  Service Category: E/M  Provider: Edward Jolly, MD  DOB: 08-26-1951  DOS: 08/19/2023  Referring Provider: Darral Dash, DO  MRN: [id number]  Specialty: Interventional Pain Management  PCP: Gregory Dash, DO  Type: Established Patient  Setting: Ambulatory outpatient    Location: Office  Delivery: Face-to-face     HPI  [name], a 72 y.o. year old male, is here today because of his Chronic pain syndrome [G89.4]. [name]'s primary complain today is Shoulder Pain (Left cortisone injection in [place] aprox 1 - 2 months ago ), Back Pain (Lumbar ;midline ), Knee Pain (Bilateral ), and Other (Generalized joint pain )  Pertinent problems: [name] has Type 2 diabetes mellitus with hyperglycemia (HCC); Carpal tunnel syndrome of right wrist; Lumbar facet arthropathy; Lumbar degenerative disc disease; Bilateral primary osteoarthritis of knee; and Primary osteoarthritis of left shoulder on their pertinent problem list. Pain Assessment: Severity of Chronic pain is reported as a 9 /10. Location: Shoulder (lower back and bilateral knees) Left/sometimes back pain down legs. Onset: More than a month ago. Quality: Discomfort, Constant, Sharp. Timing: Constant. Modifying factor(s): medications. Vitals:  height is 6\' 2"  (1.88 m) and weight is 177 lb (80.3 kg). His temporal temperature is 97.4 F (36.3 C) (abnormal). His blood pressure is 180/72 (abnormal) and his pulse is 71. His respiration is 16 and oxygen saturation is 99%.  BMI: Estimated body mass  index is 22.73 kg/m as calculated from the following:   Height as of this encounter: 6\' 2"  (1.88 m).   Weight as of this encounter: 177 lb (80.3 kg). Last encounter: 03/27/2023. Last procedure: Visit date not found.  Reason for encounter: medication management.   S/p left shoulder steroid injection with Dr Gregory Harrison, not as helpful as previous one however patient has been more active. Chronic left shoulder, low back, and bilateral knee pain Still very active and continues to do yard work and physical labor Patient's pain is at baseline.  Patient continues multimodal pain regimen as prescribed.  States that it provides pain relief and improvement in functional status.   Pharmacotherapy Assessment  Analgesic: Hydrocodone 5 mg twice daily as needed, quantity 60/month MME equals 10    [place]: [place] PDMP reviewed during this encounter.       Pharmacotherapy: No side-effects or adverse reactions reported. Compliance: No problems identified. Effectiveness: Clinically acceptable.  Vernie Ammons, RN  08/19/2023  9:20 AM  Sign when Signing Visit Nursing Pain Medication Assessment:  Safety precautions to be maintained throughout the outpatient stay will include: orient to surroundings, keep bed in low position, maintain call bell within reach at all times, provide assistance with transfer out of bed and ambulation.  Medication Inspection Compliance: Pill count conducted under aseptic conditions, in front of the patient. Neither the pills nor the bottle was removed from the patient's sight at any time. Once count was completed pills were immediately returned to the patient in their original bottle.  Medication: Hydrocodone/APAP Pill/Patch Count:  2 of 30 pills remain Pill/Patch Appearance: Markings consistent with prescribed medication Bottle Appearance: Standard pharmacy container. Clearly labeled. Filled Date: 07 / 28 /  2024 Last Medication intake:   Sunday   No results found for:  "CBDTHCR" No results found for: "D8THCCBX" No results found for: "D9THCCBX"  UDS:  Summary  Date Value Ref Range Status  03/14/2022 Note  Final    Comment:    ==================================================================== [employer] Select 13 (MW) ==================================================================== Test                             Result       Flag       Units  Drug Absent but Declared for Prescription Verification   Hydrocodone                    Not Detected UNEXPECTED ng/mg creat ==================================================================== Test                      Result    Flag   Units      Ref Range   Creatinine              45                mg/dL      >=40 ==================================================================== Declared Medications:  The flagging and interpretation on this report are based on the  following declared medications.  Unexpected results may arise from  inaccuracies in the declared medications.   **Note: The testing scope of this panel includes these medications:   Hydrocodone (Norco)   **Note: The testing scope of this panel does not include the  following reported medications:   Acetaminophen (Norco)  Amlodipine (Norvasc)  Atorvastatin (Lipitor)  Gabapentin (Neurontin)  Hydrochlorothiazide (Hydrodiuril)  Insulin (Basaglar)  Losartan (Cozaar)  Meloxicam (Mobic)  Metformin (Glucophage)  Sitagliptin (Januvia)  Topical ==================================================================== For clinical consultation, please call [phone]. ====================================================================       ROS  Constitutional: Denies any fever or chills Gastrointestinal: No reported hemesis, hematochezia, vomiting, or acute GI distress Musculoskeletal: as above Neurological: No reported episodes of acute onset apraxia, aphasia, dysarthria, agnosia, amnesia, paralysis, loss of coordination, or loss  of consciousness  Medication Review  Alcohol Swabs, Basaglar KwikPen, Emollient, HYDROcodone-acetaminophen, Insulin Pen Needle, ONE TOUCH DELICA LANCING DEV, OneTouch Delica Lancets Fine, OneTouch Verio, atorvastatin, blood glucose meter kit and supplies, gabapentin, glucose blood, hydrochlorothiazide, insulin lispro, losartan, and metFORMIN  History Review  Allergy: Gregory Harrison has No Known Allergies. Drug: Gregory Harrison  reports that he does not currently use drugs after having used the following drugs: Marijuana. Alcohol:  reports that he does not currently use alcohol. Tobacco:  reports that he quit smoking about 35 years ago. His smoking use included cigarettes. He started smoking about 55 years ago. He has a 40 pack-year smoking history. He has never used smokeless tobacco. Social: Gregory Harrison  reports that he quit smoking about 35 years ago. His smoking use included cigarettes. He started smoking about 55 years ago. He has a 40 pack-year smoking history. He has never used smokeless tobacco. He reports that he does not currently use alcohol. He reports that he does not currently use drugs after having used the following drugs: Marijuana. Medical:  has a past medical history of Arthritis, Back skin lesion (03/12/2022), Biceps tendinitis, left (05/19/2023), Cancer (HCC) (2023), Chronic left shoulder pain (02/26/2018), Diabetes (HCC) (2000), Diabetic neuropathy (HCC), GERD (gastroesophageal reflux disease), Hand lesion (05/09/2022), HTN (hypertension) (2010), Hypercholesterolemia, Impingement of right shoulder (07/30/2022), Pain in joint, shoulder region (03/24/2019), Post-viral cough syndrome (10/20/2020), Rotator cuff  impingement syndrome, left (05/19/2023), Solitary pulmonary nodule on lung CT (08/19/2022), Subacute cough (08/09/2022), Trigger finger of right thumb (06/04/2018), Trigger finger, right little finger (01/15/2020), Vision problem, and Weight loss (07/16/2019). Surgical: Gregory Harrison  has a  past surgical history that includes Hand surgery (1980s); Nose surgery (1979); Eye surgery (Left, 1990s); Tonsillectomy; and Cholecystectomy (N/A, 10/16/2022). Family: family history includes ALS in his brother; Diabetes in his paternal grandfather; Heart attack in his father; Heart murmur in his father; Other in his mother.  Laboratory Chemistry Profile   Renal Lab Results  Component Value Date   BUN 15 08/07/2023   CREATININE 0.80 08/07/2023   BCR 19 08/07/2023   GFRAA 88 10/19/2020   GFRNONAA >60 10/08/2022    Hepatic Lab Results  Component Value Date   AST 12 08/07/2023   ALT 15 08/07/2023   ALBUMIN 4.2 08/07/2023   ALKPHOS 72 08/07/2023    Electrolytes Lab Results  Component Value Date   NA 139 08/07/2023   K 4.0 08/07/2023   CL 100 08/07/2023   CALCIUM 10.0 08/07/2023    Bone No results found for: "VD25OH", "VD125OH2TOT", "NW2956OZ3", "YQ6578IO9", "25OHVITD1", "25OHVITD2", "25OHVITD3", "TESTOFREE", "TESTOSTERONE"  Inflammation (CRP: Acute Phase) (ESR: Chronic Phase) Lab Results  Component Value Date   ESRSEDRATE 20 08/08/2022         Note: Above Lab results reviewed.  Recent Imaging Review  [place] LIMITED JOINT SPACE STRUCTURES UP LEFT Procedure:  Injection of left subacromial space under ultrasound guidance. Ultrasound guidance utilized for in-plane approach to the left subacromial  space, no sonographic evidence of tendinopathy Samsung HS60 device utilized with permanent recording / reporting. Verbal informed consent obtained and verified. Skin prepped in a sterile fashion. Ethyl chloride for topical local analgesia.  Completed without difficulty and tolerated well. Medication: triamcinolone acetonide 40 mg/mL suspension for injection 1 mL  total and 2 mL lidocaine 1% without epinephrine utilized for needle  placement anesthetic Advised to contact for fevers/chills, erythema, induration, drainage, or  persistent bleeding.  Procedure:  Injection of left biceps  tendon sheath under ultrasound  guidance. Ultrasound guidance utilized for out of plane approach, peritendinous  hypoechoic region consistent with fluid noted Samsung HS60 device utilized with permanent recording / reporting. Verbal informed consent obtained and verified. Skin prepped in a sterile fashion. Ethyl chloride for topical local analgesia.  Completed without difficulty and tolerated well. Medication: triamcinolone acetonide 40 mg/mL suspension for injection 1 mL  total and 2 mL lidocaine 1% without epinephrine utilized for needle  placement anesthetic Advised to contact for fevers/chills, erythema, induration, drainage, or  persistent bleeding. Note: Reviewed        Physical Exam  General appearance: Well nourished, well developed, and well hydrated. In no apparent acute distress Mental status: Alert, oriented x 3 (person, place, & time)       Respiratory: No evidence of acute respiratory distress Eyes: PERLA Vitals: BP (!) 180/72   Pulse 71   Temp (!) 97.4 F (36.3 C) (Temporal)   Resp 16   Ht 6\' 2"  (1.88 m)   Wt 177 lb (80.3 kg)   SpO2 99%   BMI 22.73 kg/m  BMI: Estimated body mass index is 22.73 kg/m as calculated from the following:   Height as of this encounter: 6\' 2"  (1.88 m).   Weight as of this encounter: 177 lb (80.3 kg). Ideal: Ideal body weight: 82.2 kg (181 lb 3.5 oz)  Assessment   Diagnosis  1. Chronic pain syndrome   2. Chronic left  shoulder pain   3. Carpal tunnel syndrome of right wrist   4. Primary osteoarthritis of left shoulder   5. Diabetic polyneuropathy associated with type 2 diabetes mellitus (HCC)   6. Type 2 diabetes mellitus with diabetic polyneuropathy, without long-term current use of insulin (HCC)   7. Lumbar facet arthropathy        Plan of Care  Problem-specific:  No problem-specific Assessment & Plan notes found for this encounter.  Gregory Harrison has a current medication list which includes the following long-term  medication(s): atorvastatin, gabapentin, hydrochlorothiazide, basaglar kwikpen, insulin lispro, and losartan.  Pharmacotherapy (Medications Ordered): Meds ordered this encounter  Medications   HYDROcodone-acetaminophen (NORCO/VICODIN) 5-325 MG tablet    Sig: Take 1 tablet by mouth daily as needed for severe pain. Must last 30 days.    Dispense:  30 tablet    Refill:  0    Chronic Pain: STOP Act (Not applicable) Fill 1 day early if closed on refill date. Avoid benzodiazepines within 8 hours of opioids   HYDROcodone-acetaminophen (NORCO/VICODIN) 5-325 MG tablet    Sig: Take 1 tablet by mouth daily as needed for severe pain. Must last 30 days.    Dispense:  30 tablet    Refill:  0    Chronic Pain: STOP Act (Not applicable) Fill 1 day early if closed on refill date. Avoid benzodiazepines within 8 hours of opioids   HYDROcodone-acetaminophen (NORCO/VICODIN) 5-325 MG tablet    Sig: Take 1 tablet by mouth daily as needed for severe pain. Must last 30 days.    Dispense:  30 tablet    Refill:  0    Chronic Pain: STOP Act (Not applicable) Fill 1 day early if closed on refill date. Avoid benzodiazepines within 8 hours of opioids  Continue with Gabapentin and blood glucose management  Orders:  No orders of the defined types were placed in this encounter.  Follow-up plan:   Return in about 3 months (around 11/18/2023) for MM, F2F.     Recent Visits No visits were found meeting these conditions. Showing recent visits within past 90 days and meeting all other requirements Today's Visits Date Type Provider Dept  08/19/23 Office Visit Gregory Jolly, MD [facility]  Showing today's visits and meeting all other requirements Future Appointments No visits were found meeting these conditions. Showing future appointments within next 90 days and meeting all other requirements  I discussed the assessment and treatment plan with the patient. The patient was provided an opportunity to ask  questions and all were answered. The patient agreed with the plan and demonstrated an understanding of the instructions.  Patient advised to call back or seek an in-person evaluation if the symptoms or condition worsens.  Duration of encounter: 30 minutes.  Total time on encounter, as per AMA guidelines included both the face-to-face and non-face-to-face time personally spent by the physician and/or other qualified health care professional(s) on the day of the encounter (includes time in activities that require the physician or other qualified health care professional and does not include time in activities normally performed by clinical staff). Physician's time may include the following activities when performed: Preparing to see the patient (e.g., pre-charting review of records, searching for previously ordered imaging, lab work, and nerve conduction tests) Review of prior analgesic pharmacotherapies. Reviewing PMP Interpreting ordered tests (e.g., lab work, imaging, nerve conduction tests) Performing post-procedure evaluations, including interpretation of diagnostic procedures Obtaining and/or reviewing separately obtained history Performing a medically appropriate examination and/or  evaluation Counseling and educating the patient/family/caregiver Ordering medications, tests, or procedures Referring and communicating with other health care professionals (when not separately reported) Documenting clinical information in the electronic or other health record Independently interpreting results (not separately reported) and communicating results to the patient/ family/caregiver Care coordination (not separately reported)  Note by: Gregory Jolly, MD Date: 08/19/2023; Time: 9:51 AM

## 2023-08-19 NOTE — Progress Notes (Signed)
Nursing Pain Medication Assessment:  Safety precautions to be maintained throughout the outpatient stay will include: orient to surroundings, keep bed in low position, maintain call bell within reach at all times, provide assistance with transfer out of bed and ambulation.  Medication Inspection Compliance: Pill count conducted under aseptic conditions, in front of the patient. Neither the pills nor the bottle was removed from the patient's sight at any time. Once count was completed pills were immediately returned to the patient in their original bottle.  Medication: Hydrocodone/APAP Pill/Patch Count:  2 of 30 pills remain Pill/Patch Appearance: Markings consistent with prescribed medication Bottle Appearance: Standard pharmacy container. Clearly labeled. Filled Date: 07 / 16 / 2024 Last Medication intake:   Sunday

## 2023-08-20 ENCOUNTER — Telehealth: Payer: Self-pay | Admitting: Pharmacist

## 2023-08-20 NOTE — Telephone Encounter (Signed)
Patient called with question on insulin dosing.  Attempted to contact patient for follow-up of insulin dosing   Left HIPAA compliant voice mail requesting call back to direct phone: [phone]  Total time with patient call and documentation of interaction: 4 minutes.

## 2023-08-21 ENCOUNTER — Ambulatory Visit (HOSPITAL_COMMUNITY)
Admission: RE | Admit: 2023-08-21 | Discharge: 2023-08-21 | Disposition: A | Discharge status: Home / Self Care | Payer: PPO | Source: Ambulatory Visit | Attending: Gastroenterology | Admitting: Gastroenterology

## 2023-08-21 DIAGNOSIS — R911 Solitary pulmonary nodule: Secondary | ICD-10-CM | POA: Diagnosis present

## 2023-08-28 ENCOUNTER — Ambulatory Visit (INDEPENDENT_AMBULATORY_CARE_PROVIDER_SITE_OTHER): Payer: PPO | Admitting: Pharmacist

## 2023-08-28 VITALS — BP 174/78 | HR 62 | Wt 179.6 lb

## 2023-08-28 DIAGNOSIS — Z794 Long term (current) use of insulin: Secondary | ICD-10-CM

## 2023-08-28 DIAGNOSIS — E1165 Type 2 diabetes mellitus with hyperglycemia: Secondary | ICD-10-CM | POA: Diagnosis not present

## 2023-08-28 DIAGNOSIS — I152 Hypertension secondary to endocrine disorders: Secondary | ICD-10-CM

## 2023-08-28 DIAGNOSIS — E1159 Type 2 diabetes mellitus with other circulatory complications: Secondary | ICD-10-CM

## 2023-08-28 MED ORDER — INSULIN LISPRO (1 UNIT DIAL) 100 UNIT/ML (KWIKPEN): 12.0000 [IU] | PEN_INJECTOR | Freq: Two times a day (BID) | SUBCUTANEOUS | Status: DC

## 2023-08-28 MED ORDER — AMLODIPINE BESYLATE 2.5 MG PO TABS
2.5000 mg | ORAL_TABLET | Freq: Every day | ORAL | 3 refills | Status: DC
Start: 2023-08-28 — End: 2023-10-02

## 2023-08-28 NOTE — Assessment & Plan Note (Signed)
Diabetes longstanding currently uncontrolled however modestly improved with multiple glucose readings 150-200mg /dl. No glucose readings < 150mg /dl. Currently going through a dietary adjustment prior to his colonoscopy on Monday 9/23.  Patient is able to verbalize appropriate hypoglycemia management plan. Medication adherence appears optimal with diabetes medications. Control is suboptimal due to requirement of additional therapy. -Continued basal insulin of Basaglar (insulin glargine) 40 units daily. Before colonoscopy (9/23) if blood sugars drop less than 30 mg/dL, decrease insulin glargine from 40 to 30 units -Continued rapid insulin Humalog (insulin lispro) 8 units with meals until colonoscopy THEN increase to 12 units with meals.  -Continued metformin 1000 mg BID.  -Patient educated on purpose, proper use, and potential adverse effects.  -Extensively discussed pathophysiology of diabetes, recommended lifestyle interventions, dietary effects on blood sugar control.  -Counseled on s/sx of and management of hypoglycemia.  -Next A1c anticipated November 2024.

## 2023-08-28 NOTE — Progress Notes (Signed)
S:     Chief Complaint  Patient presents with   Medication Management    Diabetes   72 y.o. male who presents for diabetes evaluation, education, and management. Patient arrives in good spirits and presents without any assistance.  Patient was referred and last seen by Primary Care Provider, Dr. Melissa Noon, on 08/07/2023. Last seen by pharmacy clinic on 08/13/2023. At last visit, decreased Basaglar (insulin glargine) from 43 to 40 units daily. Started Humalog (insulin lispro) 8 units BID before meals. Instructed patient to restart amlodipine 2.5 mg at bedtime last visit but has been discontinued from medication list.   PMH is significant for T2DM, HTN, HLD.   Patient reports Diabetes was diagnosed ~ 20 years ago.   Current diabetes medications include: Basaglar (insulin glargine) 40 units daily, Humalog (insulin lispro) 8 units BID before meals, metformin 1000 mg BID Current hypertension medications include: losartan 25 mg daily, hydrochlorothiazide 12.5 mg daily Current hyperlipidemia medications include: atorvastatin 40 mg daily  Patient will call pharmacy office to let us know which medications he has at home.  Patient denies hypoglycemic events.  Reported home fasting blood sugars: 150-160s  Reported 2 hour post-meal/random blood sugars: >250s .  Patient denies nocturia (nighttime urination).  Patient denies neuropathy (nerve pain). Patient reports visual changes. Eye surgery on 10/15 Patient reports self foot exams.   Patient reported dietary habits: Colonoscopy on Monday so minimal eating (broth, boiled eggs)  O:   Review of Systems  All other systems reviewed and are negative.  Lab Results  Component Value Date   HGBA1C 13.0 (A) 08/07/2023   Vitals:   08/28/23 0840 08/28/23 0901  BP: (!) 171/75 (!) 174/78  Pulse: 62   SpO2: 100%     Lipid Panel     Component Value Date/Time   CHOL 101 03/31/2023 1601   TRIG 170 (H) 03/31/2023 1601   HDL 43 03/31/2023 1601    CHOLHDL 2.3 03/31/2023 1601   LDLCALC 30 03/31/2023 1601   LDLDIRECT 134 (H) 02/24/2018 1613    Clinical Atherosclerotic Cardiovascular Disease (ASCVD): No   A/P: Diabetes longstanding currently uncontrolled however modestly improved with multiple glucose readings 150-200mg /dl. No glucose readings < 150mg /dl. Currently going through a dietary adjustment prior to his colonoscopy on Monday 9/23.  Patient is able to verbalize appropriate hypoglycemia management plan. Medication adherence appears optimal with diabetes medications. Control is suboptimal due to requirement of additional therapy. -Continued basal insulin of Basaglar (insulin glargine) 40 units daily. Before colonoscopy (9/23) if blood sugars drop less than 30 mg/dL, decrease insulin glargine from 40 to 30 units -Continued rapid insulin Humalog (insulin lispro) 8 units with meals until colonoscopy THEN increase to 12 units with meals.  -Continued metformin 1000 mg BID.  -Patient educated on purpose, proper use, and potential adverse effects.  -Extensively discussed pathophysiology of diabetes, recommended lifestyle interventions, dietary effects on blood sugar control.  -Counseled on s/sx of and management of hypoglycemia.  -Next A1c anticipated November 2024.   ASCVD risk - primary prevention in patient with diabetes. Last LDL (03/31/23) is 30 at goal of <70 mg/dL.  -Continued atorvastatin 40 mg once daily.  Hypertension longstanding currently uncontrolled based on office reading of 174/78. Blood pressure goal of <130/80 mmHg. Medication adherence suboptimal. Instructed patient to call office and inform us of which blood pressure medications he is taking at home. -Restarted amlodipine 2.5 mg daily. Sent in new prescription to pharmacy -Continued losartan 25 mg daily and hydrochlorothiazide 12.5 mg daily.  Written patient instructions provided. Patient verbalized understanding of treatment plan.  Total time in face to face  counseling 35 minutes.    Follow-up:  Pharmacist in 3 weeks PCP clinic visit in PRN Patient seen with Alesia Banda, PharmD Candidate and Andee Poles, PharmD Candidate.

## 2023-08-28 NOTE — Assessment & Plan Note (Signed)
Diabetes longstanding currently uncontrolled however modestly improved with multiple glucose readings 150-200mg /dl. No glucose readings < 150mg /dl. Currently going through a dietary adjustment prior to his colonoscopy on Monday 9/23.  Patient is able to verbalize appropriate hypoglycemia management plan. Medication adherence appears optimal with diabetes medications. Control is suboptimal due to requirement of additional therapy. -Continued basal insulin of Basaglar (insulin glargine) 40 units daily. Before colonoscopy (9/23) if blood sugars drop less than 30 mg/dL, decrease insulin glargine from 40 to 30 units -Continued rapid insulin Humalog (insulin lispro) 8 units with meals until colonoscopy THEN increase to 12 units with meals.  -Continued metformin 1000 mg BID.  -Patient educated on purpose, proper use, and potential adverse effects.  -Extensively discussed pathophysiology of diabetes, recommended lifestyle interventions, dietary effects on blood sugar control.  -Counseled on s/sx of and management of hypoglycemia.  -Next A1c anticipated November 2024.   ASCVD risk - primary prevention in patient with diabetes. Last LDL (03/31/23) is 30 at goal of <70 mg/dL.  -Continued atorvastatin 40 mg once daily.  Hypertension longstanding currently uncontrolled based on office reading of 174/78. Blood pressure goal of <130/80 mmHg. Medication adherence suboptimal. Instructed patient to call office and inform us of which blood pressure medications he is taking at home. -Restarted amlodipine 2.5 mg daily. Sent in new prescription to pharmacy -Continued losartan 25 mg daily and hydrochlorothiazide 12.5 mg daily.

## 2023-08-28 NOTE — Patient Instructions (Addendum)
It was nice to see you today!  Your goal blood sugar is 80-130 before eating and less than 180 after eating.  Medication Changes: Call Dr. Raymondo Band [phone]) and let us know if you are taking amlodipine, losartan, hydrochlorothiazide, and atorvastatin.   START amlodipine 2.5 mg daily. Sent new prescription to pharmacy.  Before colonoscopy, if you see blood sugars less than 100 mg/dL, decrease Basaglar (insulin glargine) from 40 to 30 units.   After colonoscopy, on Tuesday (9/24) Increase Humalog (insulin lispro) to 12 units twice daily with meals.   Please bring all medications with you at next appointment!  Continue all other medication the same.  Monitor blood sugars at home and keep a log (glucometer or piece of paper) to bring with you to your next visit.  Keep up the good work with diet and exercise. Aim for a diet full of vegetables, fruit and lean meats (chicken, [place], fish). Try to limit salt intake by eating fresh or frozen vegetables (instead of canned), rinse canned vegetables prior to cooking and do not add any additional salt to meals.

## 2023-08-28 NOTE — Progress Notes (Signed)
Reviewed and agree with Dr Koval's plan.   

## 2023-09-01 ENCOUNTER — Ambulatory Visit (AMBULATORY_SURGERY_CENTER): Payer: PPO | Admitting: Gastroenterology

## 2023-09-01 ENCOUNTER — Encounter: Payer: Self-pay | Admitting: Gastroenterology

## 2023-09-01 VITALS — BP 154/76 | HR 61 | Temp 98.0°F | Resp 10 | Ht 74.0 in | Wt 177.0 lb

## 2023-09-01 DIAGNOSIS — D123 Benign neoplasm of transverse colon: Secondary | ICD-10-CM

## 2023-09-01 DIAGNOSIS — Z09 Encounter for follow-up examination after completed treatment for conditions other than malignant neoplasm: Secondary | ICD-10-CM | POA: Diagnosis not present

## 2023-09-01 DIAGNOSIS — D122 Benign neoplasm of ascending colon: Secondary | ICD-10-CM

## 2023-09-01 DIAGNOSIS — D125 Benign neoplasm of sigmoid colon: Secondary | ICD-10-CM

## 2023-09-01 DIAGNOSIS — Z86010 Personal history of colonic polyps: Secondary | ICD-10-CM | POA: Diagnosis not present

## 2023-09-01 DIAGNOSIS — D128 Benign neoplasm of rectum: Secondary | ICD-10-CM

## 2023-09-01 MED ORDER — SODIUM CHLORIDE 0.9 % IV SOLN: 500.0000 mL | INTRAVENOUS | Status: DC

## 2023-09-01 NOTE — Progress Notes (Signed)
[facility] History and Physical   Primary Care Physician:  Darral Dash, DO   Reason for Procedure:  History of adenomatous colon polyps  Plan:    Surveillance colonoscopy with possible interventions as needed     HPI: Gregory Harrison is a very pleasant 72 y.o. male here for surveillance colonoscopy. Denies any nausea, vomiting, abdominal pain, melena or bright red blood per rectum  The risks and benefits as well as alternatives of endoscopic procedure(s) have been discussed and reviewed. All questions answered. The patient agrees to proceed.    Past Medical History:  Diagnosis Date   Arthritis    2010   Back skin lesion 03/12/2022   Biceps tendinitis, left 05/19/2023   Cancer (HCC) 2023   L hand- skin cancer   Chronic left shoulder pain 02/26/2018   Formatting of this note might be different from the original. Last Assessment & Plan: Formatting of this note might be different from the original. Trial of meloxicam 7.5 mg daily for 2 week.  Noted that this medication is on the beers list.  Discussed the risk of cardiovascular events, increased blood pressure.  Advised to stay away from any other NSAIDs ibuprofen Advil, naproxen. Tramadol 50 mg    Diabetes (HCC) 2000   Diabetic neuropathy (HCC)    GERD (gastroesophageal reflux disease)    Hand lesion 05/09/2022   HTN (hypertension) 2010   Hypercholesterolemia    Impingement of right shoulder 07/30/2022   Pain in joint, shoulder region 03/24/2019   Left shoulder joint pain.  Mild crepitus decreased range of motion with hyperextension.     Post-viral cough syndrome 10/20/2020   Rotator cuff impingement syndrome, left 05/19/2023   Solitary pulmonary nodule on lung CT 08/19/2022   Chest CT 08/15/22: 9 mm LUL nodule with spiculated margins concerning for bronchogenic carcinoma.   Subacute cough 08/09/2022   Trigger finger of right thumb 06/04/2018   Trigger finger, right little finger 01/15/2020   Vision problem     R eye "blocked" since age 65, blurry vision   Weight loss 07/16/2019    Past Surgical History:  Procedure Laterality Date   CHOLECYSTECTOMY N/A 10/16/2022   Procedure: LAPAROSCOPIC CHOLECYSTECTOMY;  Surgeon: Fritzi Mandes, MD;  Location: [facility] OR;  Service: General;  Laterality: N/A;   EYE SURGERY Left 1990s   " to peel off a growth"   HAND SURGERY  1980s   R hand pointer finger    NOSE SURGERY  1979   after broke his nose   TONSILLECTOMY     at age 4    Prior to Admission medications   Medication Sig Start Date End Date Taking? Authorizing Provider  Alcohol Swabs PADS Use with insulin 08/05/18  Yes Artist Pais, Elsia J, DO  amLODipine (NORVASC) 2.5 MG tablet Take 1 tablet (2.5 mg total) by mouth at bedtime. 08/28/23  Yes McDiarmid, Leighton Roach, MD  hydrochlorothiazide (HYDRODIURIL) 12.5 MG tablet TAKE 1 TABLET BY MOUTH EVERY DAY 07/30/22  Yes Dameron, Nolberto Hanlon, DO  Insulin Glargine (BASAGLAR KWIKPEN) 100 UNIT/ML Inject 40 Units into the skin daily. 08/13/23  Yes McDiarmid, Leighton Roach, MD  Insulin Pen Needle 31G X 8 MM MISC Check blood sugars twice a day 10/27/20  Yes Dana Allan, MD  Lancet Devices (ONE TOUCH DELICA LANCING DEV) MISC 1 each by Does not apply route as directed. 08/05/18  Yes Jeneen Rinks J, DO  losartan (COZAAR) 25 MG tablet TAKE 1 TABLET BY MOUTH EVERY DAY 07/30/22  Yes Dameron, Nolberto Hanlon,  DO  metFORMIN (GLUCOPHAGE) 500 MG tablet Take 1,000 mg by mouth 2 (two) times daily.   Yes [provider]  [facility] DELICA LANCETS FINE MISC 1 each by Does not apply route 3 (three) times daily. 08/05/18  Yes Jeneen Rinks J, DO  atorvastatin (LIPITOR) 40 MG tablet TAKE 1 TABLET BY MOUTH EVERY DAY 02/10/23   Dameron, Nolberto Hanlon, DO  blood glucose meter kit and supplies Dispense based on patient and insurance preference. Use up to four times daily as directed. (FOR ICD-10 E10.9, E11.9). 10/19/20   Dana Allan, MD  Blood Glucose Monitoring Suppl [facility] VERIO) w/Device KIT 1 each by Does not apply route as  directed. 08/13/23   McDiarmid, Leighton Roach, MD  Emollient (AQUAPHOR ADVANCED THERAPY EX) Apply 1 Application topically 2 (two) times daily. Patient not taking: Reported on 08/28/2023    [provider]  gabapentin (NEURONTIN) 400 MG capsule Take 3 capsules (1,200 mg total) by mouth at bedtime. 01/07/23   Edward Jolly, MD  glucose blood (ONETOUCH VERIO) test strip Use to check blood sugar 3x per day. E11.9 08/13/23   McDiarmid, Leighton Roach, MD  HYDROcodone-acetaminophen (NORCO/VICODIN) 5-325 MG tablet Take 1 tablet by mouth daily as needed for severe pain. Must last 30 days. Patient not taking: Reported on 08/28/2023 08/20/23 09/19/23  Edward Jolly, MD  HYDROcodone-acetaminophen (NORCO/VICODIN) 5-325 MG tablet Take 1 tablet by mouth daily as needed for severe pain. Must last 30 days. Patient not taking: Reported on 08/28/2023 09/19/23 10/19/23  Edward Jolly, MD  HYDROcodone-acetaminophen (NORCO/VICODIN) 5-325 MG tablet Take 1 tablet by mouth daily as needed for severe pain. Must last 30 days. Patient not taking: Reported on 08/28/2023 10/19/23 11/18/23  Edward Jolly, MD  insulin lispro (HUMALOG KWIKPEN) 100 UNIT/ML KwikPen Inject 12 Units into the skin 2 (two) times daily before a meal. Start on Tuesday (9/24) 08/28/23   McDiarmid, Leighton Roach, MD    Current Outpatient Medications  Medication Sig Dispense Refill   Alcohol Swabs PADS Use with insulin 100 each 3   amLODipine (NORVASC) 2.5 MG tablet Take 1 tablet (2.5 mg total) by mouth at bedtime. 90 tablet 3   hydrochlorothiazide (HYDRODIURIL) 12.5 MG tablet TAKE 1 TABLET BY MOUTH EVERY DAY 90 tablet 3   Insulin Glargine (BASAGLAR KWIKPEN) 100 UNIT/ML Inject 40 Units into the skin daily.     Insulin Pen Needle 31G X 8 MM MISC Check blood sugars twice a day 1000 each 0   Lancet Devices (ONE TOUCH DELICA LANCING DEV) MISC 1 each by Does not apply route as directed. 1 each 0   losartan (COZAAR) 25 MG tablet TAKE 1 TABLET BY MOUTH EVERY DAY 90 tablet 3    metFORMIN (GLUCOPHAGE) 500 MG tablet Take 1,000 mg by mouth 2 (two) times daily.     ONETOUCH DELICA LANCETS FINE MISC 1 each by Does not apply route 3 (three) times daily. 100 each 3   atorvastatin (LIPITOR) 40 MG tablet TAKE 1 TABLET BY MOUTH EVERY DAY 90 tablet 3   blood glucose meter kit and supplies Dispense based on patient and insurance preference. Use up to four times daily as directed. (FOR ICD-10 E10.9, E11.9). 1 each 0   Blood Glucose Monitoring Suppl (ONETOUCH VERIO) w/Device KIT 1 each by Does not apply route as directed. 1 kit 0   Emollient (AQUAPHOR ADVANCED THERAPY EX) Apply 1 Application topically 2 (two) times daily. (Patient not taking: Reported on 08/28/2023)     gabapentin (NEURONTIN) 400 MG capsule Take 3  capsules (1,200 mg total) by mouth at bedtime. 270 capsule 3   glucose blood (ONETOUCH VERIO) test strip Use to check blood sugar 3x per day. E11.9 100 strip 12   HYDROcodone-acetaminophen (NORCO/VICODIN) 5-325 MG tablet Take 1 tablet by mouth daily as needed for severe pain. Must last 30 days. (Patient not taking: Reported on 08/28/2023) 30 tablet 0   [START ON 09/19/2023] HYDROcodone-acetaminophen (NORCO/VICODIN) 5-325 MG tablet Take 1 tablet by mouth daily as needed for severe pain. Must last 30 days. (Patient not taking: Reported on 08/28/2023) 30 tablet 0   [START ON 10/19/2023] HYDROcodone-acetaminophen (NORCO/VICODIN) 5-325 MG tablet Take 1 tablet by mouth daily as needed for severe pain. Must last 30 days. (Patient not taking: Reported on 08/28/2023) 30 tablet 0   insulin lispro (HUMALOG KWIKPEN) 100 UNIT/ML KwikPen Inject 12 Units into the skin 2 (two) times daily before a meal. Start on Tuesday (9/24)     Current Facility-Administered Medications  Medication Dose Route Frequency Provider Last Rate Last Admin   0.9 %  sodium chloride infusion  500 mL Intravenous Continuous Gari Hartsell V, MD        Allergies as of 09/01/2023   (No Known Allergies)    Family  History  Problem Relation Age of Onset   Heart attack Father        died in 29, had open surgery   Heart murmur Father    Other Mother        bladder taken out for unknown reason. unspecified brain tumor   ALS Brother    Diabetes Paternal Grandfather    Colon cancer Neg Hx    Colon polyps Neg Hx    Esophageal cancer Neg Hx    Rectal cancer Neg Hx    Stomach cancer Neg Hx     Social History   Socioeconomic History   Marital status: Married    Spouse name: Deanna Hanish   Number of children: 2   Years of education: 12   Highest education level: High school graduate  Occupational History   Occupation: retired  Tobacco Use   Smoking status: Former    Current packs/day: 0.00    Average packs/day: 2.0 packs/day for 20.0 years (40.0 ttl pk-yrs)    Types: Cigarettes    Start date: 37    Quit date: 1989    Years since quitting: 35.7   Smokeless tobacco: Never  Vaping Use   Vaping status: Never Used  Substance and Sexual Activity   Alcohol use: Not Currently   Drug use: Not Currently    Types: Marijuana   Sexual activity: Yes    Partners: Female  Other Topics Concern   Not on file  Social History Narrative   Patient lives with his wife, daughter and her 2 children.    Patient enjoys hunting and fishing and being outside.    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    Patient has 2 dogs and 1 cat.    Social Determinants of Health   Financial Resource Strain: Low Risk  (05/08/2023)   Overall Financial Resource Strain (CARDIA)    Difficulty of Paying Living Expenses: Not hard at all  Food Insecurity: No Food Insecurity (05/08/2023)   Hunger Vital Sign    Worried About Running Out of Food in the Last Year: Never true    Ran Out of Food in the Last Year: Never true  Transportation Needs: No Transportation Needs (05/08/2023)  PRAPARE - Administrator, Civil Service (Medical): No    Lack of Transportation  (Non-Medical): No  Physical Activity: Sufficiently Active (05/08/2023)   Exercise Vital Sign    Days of Exercise per Week: 4 days    Minutes of Exercise per Session: 40 min  Stress: No Stress Concern Present (05/08/2023)   [employer] of Occupational Health - Occupational Stress Questionnaire    Feeling of Stress : Not at all  Social Connections: Moderately Integrated (05/08/2023)   Social Connection and Isolation Panel [NHANES]    Frequency of Communication with Friends and Family: Twice a week    Frequency of Social Gatherings with Friends and Family: Three times a week    Attends Religious Services: Never    Active Member of Clubs or Organizations: Yes    Attends Banker Meetings: More than 4 times per year    Marital Status: Married  Catering manager Violence: Not At Risk (05/08/2023)   Humiliation, Afraid, Rape, and Kick questionnaire    Fear of Current or Ex-Partner: No    Emotionally Abused: No    Physically Abused: No    Sexually Abused: No    Review of Systems:  All other review of systems negative except as mentioned in the HPI.  Physical Exam: Vital signs in last 24 hours: BP (!) 208/94   Pulse 70   Temp 98 F (36.7 C)   Resp 12   Ht 6\' 2"  (1.88 m)   Wt 177 lb (80.3 kg)   SpO2 99%   BMI 22.73 kg/m  General:   Alert, NAD Lungs:  Clear .   Heart:  Regular rate and rhythm Abdomen:  Soft, nontender and nondistended. Neuro/Psych:  Alert and cooperative. Normal mood and affect. A and O x 3  Reviewed labs, radiology imaging, old records and pertinent past GI work up  Patient is appropriate for planned procedure(s) and anesthesia in an ambulatory setting   K. Scherry Ran , MD [phone]

## 2023-09-01 NOTE — Progress Notes (Signed)
Vss nad trans to pacu 

## 2023-09-01 NOTE — Progress Notes (Signed)
Called to room to assist during endoscopic procedure.  Patient ID and intended procedure confirmed with present staff. Received instructions for my participation in the procedure from the performing physician.  

## 2023-09-01 NOTE — Op Note (Signed)
[facility] Patient Name: Gregory Harrison Procedure Date: 09/01/2023 7:25 AM MRN: [id number] Endoscopist: Napoleon Form , MD, [id number] Age: 72 Referring MD:  Date of Birth: [birth year] Gender: Male Account #: [id number] Procedure:                Colonoscopy Indications:              High risk colon cancer surveillance: Personal                            history of colonic polyps, High risk colon cancer                            surveillance: Personal history of adenoma (10 mm or                            greater in size), High risk colon cancer                            surveillance: Personal history of multiple (3 or                            more) adenomas Medicines:                Monitored Anesthesia Care Procedure:                Pre-Anesthesia Assessment:                           - Prior to the procedure, a History and Physical                            was performed, and patient medications and                            allergies were reviewed. The patient's tolerance of                            previous anesthesia was also reviewed. The risks                            and benefits of the procedure and the sedation                            options and risks were discussed with the patient.                            All questions were answered, and informed consent                            was obtained. Prior Anticoagulants: The patient has                            taken no anticoagulant or antiplatelet agents. ASA  Grade Assessment: III - A patient with severe                            systemic disease. After reviewing the risks and                            benefits, the patient was deemed in satisfactory                            condition to undergo the procedure.                           After obtaining informed consent, the colonoscope                            was passed under direct vision. Throughout the                             procedure, the patient's blood pressure, pulse, and                            oxygen saturations were monitored continuously. The                            Olympus Scope [id number] was introduced through the                            anus and advanced to the the cecum, identified by                            appendiceal orifice and ileocecal valve. The                            colonoscopy was performed without difficulty. The                            patient tolerated the procedure well. The quality                            of the bowel preparation was good. The ileocecal                            valve, appendiceal orifice, and rectum were                            photographed. Scope In: 8:14:12 AM Scope Out: 8:34:53 AM Scope Withdrawal Time: 0 hours 16 minutes 29 seconds  Total Procedure Duration: 0 hours 20 minutes 41 seconds  Findings:                 The perianal and digital rectal examinations were                            normal.  Five sessile polyps were found in the rectum,                            sigmoid colon, transverse colon and ascending                            colon. The polyps were 4 to 12 mm in size. These                            polyps were removed with a cold snare. Resection                            and retrieval were complete.                           Scattered large-mouthed, medium-mouthed and                            small-mouthed diverticula were found in the sigmoid                            colon, descending colon, transverse colon and                            ascending colon.                           Non-bleeding external and internal hemorrhoids were                            found during retroflexion. The hemorrhoids were                            medium-sized. Complications:            No immediate complications. Estimated Blood Loss:     Estimated blood loss was  minimal. Impression:               - Five 4 to 12 mm polyps in the rectum, in the                            sigmoid colon, in the transverse colon and in the                            ascending colon, removed with a cold snare.                            Resected and retrieved.                           - Moderate diverticulosis in the sigmoid colon, in                            the descending colon, in the transverse colon and  in the ascending colon.                           - Non-bleeding external and internal hemorrhoids. Recommendation:           - Patient has a contact number available for                            emergencies. The signs and symptoms of potential                            delayed complications were discussed with the                            patient. Return to normal activities tomorrow.                            Written discharge instructions were provided to the                            patient.                           - Resume previous diet.                           - Continue present medications.                           - Await pathology results.                           - Repeat colonoscopy in 3 years for surveillance                            based on pathology results. Napoleon Form, MD 09/01/2023 8:41:41 AM This report has been signed electronically.

## 2023-09-01 NOTE — Progress Notes (Signed)
Pt's states no medical or surgical changes since previsit or office visit. 

## 2023-09-01 NOTE — Patient Instructions (Signed)
Handouts Provided:  Polyps and Diverticulosis  YOU HAD AN ENDOSCOPIC PROCEDURE TODAY AT THE [facility]:   Refer to the procedure report that was given to you for any specific questions about what was found during the examination.  If the procedure report does not answer your questions, please call your gastroenterologist to clarify.  If you requested that your care partner not be given the details of your procedure findings, then the procedure report has been included in a sealed envelope for you to review at your convenience later.  YOU SHOULD EXPECT: Some feelings of bloating in the abdomen. Passage of more gas than usual.  Walking can help get rid of the air that was put into your GI tract during the procedure and reduce the bloating. If you had a lower endoscopy (such as a colonoscopy or flexible sigmoidoscopy) you may notice spotting of blood in your stool or on the toilet paper. If you underwent a bowel prep for your procedure, you may not have a normal bowel movement for a few days.  Please Note:  You might notice some irritation and congestion in your nose or some drainage.  This is from the oxygen used during your procedure.  There is no need for concern and it should clear up in a day or so.  SYMPTOMS TO REPORT IMMEDIATELY:  Following lower endoscopy (colonoscopy or flexible sigmoidoscopy):  Excessive amounts of blood in the stool  Significant tenderness or worsening of abdominal pains  Swelling of the abdomen that is new, acute  Fever of 100F or higher  For urgent or emergent issues, a gastroenterologist can be reached at any hour by calling [phone]. Do not use MyChart messaging for urgent concerns.    DIET:  We do recommend a small meal at first, but then you may proceed to your regular diet.  Drink plenty of fluids but you should avoid alcoholic beverages for 24 hours.  ACTIVITY:  You should plan to take it easy for the rest of today and you should NOT DRIVE  or use heavy machinery until tomorrow (because of the sedation medicines used during the test).    FOLLOW UP: Our staff will call the number listed on your records the next business day following your procedure.  We will call around 7:15- 8:00 am to check on you and address any questions or concerns that you may have regarding the information given to you following your procedure. If we do not reach you, we will leave a message.     If any biopsies were taken you will be contacted by phone or by letter within the next 1-3 weeks.  Please call us at [phone] if you have not heard about the biopsies in 3 weeks.    SIGNATURES/CONFIDENTIALITY: You and/or your care partner have signed paperwork which will be entered into your electronic medical record.  These signatures attest to the fact that that the information above on your After Visit Summary has been reviewed and is understood.  Full responsibility of the confidentiality of this discharge information lies with you and/or your care-partner.

## 2023-09-02 ENCOUNTER — Telehealth: Payer: Self-pay | Admitting: *Deleted

## 2023-09-02 NOTE — Telephone Encounter (Signed)
  Follow up Call-     09/01/2023    7:32 AM  Call back number  Post procedure Call Back phone  # [phone]  Permission to leave phone message Yes     Patient questions:  Do you have a fever, pain , or abdominal swelling? No. Pain Score  0 *  Have you tolerated food without any problems? Yes.    Have you been able to return to your normal activities? Yes.    Do you have any questions about your discharge instructions: Diet   No. Medications  No. Follow up visit  No.  Do you have questions or concerns about your Care? No.  Actions: * If pain score is 4 or above: No action needed, pain <4.

## 2023-09-03 ENCOUNTER — Encounter: Payer: Self-pay | Admitting: Gastroenterology

## 2023-09-03 LAB — SURGICAL PATHOLOGY

## 2023-09-04 ENCOUNTER — Telehealth: Payer: Self-pay | Admitting: Student

## 2023-09-04 NOTE — Telephone Encounter (Signed)
Patient came in stating that he had a MRI done on 08/21/23 and has not heard anything yet about the results. Asking if someone can reach out to him to let him know what the results are please.

## 2023-09-05 NOTE — Telephone Encounter (Signed)
Called patient and relayed message that was from Dr.Dameron about his results.  He stated that there wasn't any symptoms and that he would just wait for Dr. Melissa Noon to inform him about his results.  Drusilla Kanner, CMA

## 2023-09-15 ENCOUNTER — Ambulatory Visit: Payer: PPO | Admitting: Pharmacist

## 2023-09-24 ENCOUNTER — Other Ambulatory Visit: Payer: Self-pay | Admitting: Student

## 2023-09-24 DIAGNOSIS — I10 Essential (primary) hypertension: Secondary | ICD-10-CM

## 2023-09-24 LAB — HM DIABETES EYE EXAM

## 2023-10-02 ENCOUNTER — Encounter: Payer: Self-pay | Admitting: Pharmacist

## 2023-10-02 ENCOUNTER — Ambulatory Visit (INDEPENDENT_AMBULATORY_CARE_PROVIDER_SITE_OTHER): Payer: PPO | Admitting: Pharmacist

## 2023-10-02 VITALS — BP 173/85 | HR 66 | Wt 180.4 lb

## 2023-10-02 DIAGNOSIS — Z794 Long term (current) use of insulin: Secondary | ICD-10-CM | POA: Diagnosis not present

## 2023-10-02 DIAGNOSIS — E1169 Type 2 diabetes mellitus with other specified complication: Secondary | ICD-10-CM | POA: Diagnosis not present

## 2023-10-02 DIAGNOSIS — E1159 Type 2 diabetes mellitus with other circulatory complications: Secondary | ICD-10-CM

## 2023-10-02 DIAGNOSIS — I152 Hypertension secondary to endocrine disorders: Secondary | ICD-10-CM

## 2023-10-02 DIAGNOSIS — E1165 Type 2 diabetes mellitus with hyperglycemia: Secondary | ICD-10-CM | POA: Diagnosis not present

## 2023-10-02 DIAGNOSIS — E785 Hyperlipidemia, unspecified: Secondary | ICD-10-CM

## 2023-10-02 MED ORDER — AMLODIPINE BESYLATE 5 MG PO TABS
5.0000 mg | ORAL_TABLET | Freq: Every day | ORAL | 0 refills | Status: DC
Start: 2023-10-02 — End: 2023-10-16

## 2023-10-02 NOTE — Patient Instructions (Signed)
It was nice to see you today!  Your goal blood sugar is 80-130 before eating and less than 180 after eating.  Medication Changes: START amlodipine 5 mg once daily  STOP amlodipine 2.5 mg once daily  START losartan 25 mg 2 tablets once daily START hydrochlorothiazide 12.5 mg 2 tablets once daily  INCREASE Humalog (insulin lispro) 10 units once daily to 10 units twice daily with meals (take 10 units with breakfast and 10 units with supper)   Continue all other medication the same.     Bring all your medications to next appointment!   Monitor blood sugars at home and keep a log (glucometer or piece of paper) to bring with you to your next visit.  Keep up the good work with diet and exercise. Aim for a diet full of vegetables, fruit and lean meats (chicken, [place], fish). Try to limit salt intake by eating fresh or frozen vegetables (instead of canned), rinse canned vegetables prior to cooking and do not add any additional salt to meals.

## 2023-10-02 NOTE — Progress Notes (Signed)
S:     Chief Complaint  Patient presents with   Medication Management    T2DM, HTN F/U   72 y.o. male who presents for diabetes evaluation, education, and management. Patient arrives in good spirits and presents without any assistance.   Right shoulder pain   Patient was last seen by Primary Care Provider, Dr. Melissa Noon, on 08/07/2023.   PMH is significant for T2DM, HTN, HLD.  At last visit, .   Reported high blood pressure when he had colonoscopy at the end of September. Was out of hydrochlorothiazide and losartan for about a week, and picked them both up yesterday (took medications today).   Patient reports Diabetes was diagnosed in ~25 years ago.   Family/Social History: Grandfather had T2DM  Current diabetes medications include: Basaglar (insulin glargine) 40 units once daily, Humalog (insulin lispro) 12 units twice daily with meals Current hypertension medications include: amlodipine 2.5 mg once daily, hydrochlorothiazide 12.5 mg once daily, losartan 25 mg once daily Current hyperlipidemia medications include: atorvastatin 40 mg once daily  Patient reports adherence to taking all medications as prescribed, except Humalog (insulin lispro), which he has only been taking 10 units once in the morning, rather than 12 units twice daily.   Do you feel that your medications are working for you? yes Have you been experiencing any side effects to the medications prescribed? no Insurance coverage: [employer]  Patient reports hypoglycemic events.Before colonoscopy ~66, but doesn't happen often, since he usually eats. Reports he can feel when his sugar goes below ~100.   Reported home fasting blood sugars: 116 yesterday AM, 197 this AM  Reported 2 hour post-meal/random blood sugars: ~200-210.  Patient reports nocturia (nighttime urination). ~1 time Patient reports neuropathy (nerve pain). Patient denies visual changes. Patient reports self foot exams.   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   Physical Exam Vitals reviewed.  Constitutional:      Appearance: Normal appearance.  Pulmonary:     Effort: Pulmonary effort is normal.  Musculoskeletal:     Right lower leg: No edema.     Left lower leg: No edema.  Neurological:     Mental Status: He is alert.  Psychiatric:        Mood and Affect: Mood normal.        Behavior: Behavior normal.        Thought Content: Thought content normal.        Judgment: Judgment normal.    Lab Results  Component Value Date   HGBA1C 13.0 (A) 08/07/2023   Vitals:   10/02/23 1426 10/02/23 1433  BP: (!) 166/73 (!) 173/85  Pulse: 66   SpO2: 98%     Lipid Panel     Component Value Date/Time   CHOL 101 03/31/2023 1601   TRIG 170 (H) 03/31/2023 1601   HDL 43 03/31/2023 1601   CHOLHDL 2.3 03/31/2023 1601   LDLCALC 30 03/31/2023 1601   LDLDIRECT 134 (H) 02/24/2018 1613    Clinical Atherosclerotic Cardiovascular Disease (ASCVD): No  The ASCVD Risk score (Arnett DK, et al., 2019) failed to calculate for the following reasons:   The valid total cholesterol range is 130 to 320 mg/dL   A/P: Diabetes longstanding currently uncontrolled. Patient is able to verbalize appropriate  hypoglycemia management plan. Medication adherence appears okay. Control is suboptimal due to not using his Humalog (insulin lispro) 12 units twice daily, rather he is using 10 units once daily.  -Increased Humalog (insulin lispro) from Pt reported 10 units once daily to 10 units twice daily -Continued Basaglar (insulin glargine)at 40 units once daily in the morning.   Patient educated on purpose, proper use, and potential adverse effects of.  -Extensively discussed pathophysiology of diabetes,  recommended lifestyle interventions, dietary effects on blood sugar control.  -Counseled on s/sx of and management of hypoglycemia.   ASCVD risk - primary prevention in patient with diabetes. Last LDL is 134 (02/2018) not at goal of <70 mg/dL.  - Continued atorvastatin 40 mg once daily  Hypertension longstanding currently uncontrolled. Blood pressure goal of <130/80 mmHg. Medication adherence okay, did miss 1 week of hydrochlorothiazide and losartan, but recently picked up from pharmacy. Blood pressure control is suboptimal due to medication non-adherence and suboptimal medication therapy.  -Increased amlodipine from 2.5 mg once daily to 5 mg once daily - new prescription  -Increased hydrochlorothiazide from 12.5 mg once daily to 25mg  (2 tablets x 12.5 mg) once daily -Increased losartan from 25 mg once daily to 50 mg (2 tablets X 25 mg) once daily  - Pt just picked up hydrochlorothiazide and losartan, sent in new Rx for amlodipine, at next appointment, consider 3-in-1 medication if tolerating doses.   Written patient instructions provided. Patient verbalized understanding of treatment plan.  Total time in face to face counseling 27 minutes.    Follow-up:  Pharmacist 10/16/2023 PCP clinic visit in [place] Patient seen with Shona Simpson, PharmD Candidate.

## 2023-10-03 NOTE — Assessment & Plan Note (Signed)
Hypertension longstanding currently uncontrolled. Blood pressure goal of <130/80 mmHg. Medication adherence okay, did miss 1 week of hydrochlorothiazide and losartan, but recently picked up from pharmacy. Blood pressure control is suboptimal due to medication non-adherence and suboptimal medication therapy.  -Increased amlodipine from 2.5 mg once daily to 5 mg once daily - new prescription  -Increased hydrochlorothiazide from 12.5 mg once daily to 25mg  (2 tablets x 12.5 mg) once daily -Increased losartan from 25 mg once daily to 50 mg (2 tablets X 25 mg) once daily  - Pt just picked up hydrochlorothiazide and losartan, sent in new Rx for amlodipine, at next appointment, consider 3-in-1 medication if tolerating doses.

## 2023-10-03 NOTE — Assessment & Plan Note (Signed)
Diabetes longstanding currently uncontrolled. Patient is able to verbalize appropriate hypoglycemia management plan. Medication adherence appears okay. Control is suboptimal due to not using his Humalog (insulin lispro) 12 units twice daily, rather he is using 10 units once daily.  -Increased Humalog (insulin lispro) from Pt reported 10 units once daily to 10 units twice daily -Continued Basaglar (insulin glargine)at 40 units once daily in the morning.   Patient educated on purpose, proper use, and potential adverse effects of.  -Extensively discussed pathophysiology of diabetes, recommended lifestyle interventions, dietary effects on blood sugar control.  -Counseled on s/sx of and management of hypoglycemia.

## 2023-10-03 NOTE — Assessment & Plan Note (Signed)
ASCVD risk - primary prevention in patient with diabetes. Last LDL is 134 (02/2018) not at goal of <70 mg/dL.  - Continued atorvastatin 40 mg once daily Consider dose increase at next visit.

## 2023-10-03 NOTE — Progress Notes (Signed)
Reviewed and agree with Dr Koval's plan.   

## 2023-10-15 ENCOUNTER — Other Ambulatory Visit: Payer: Self-pay | Admitting: Student

## 2023-10-15 DIAGNOSIS — I10 Essential (primary) hypertension: Secondary | ICD-10-CM

## 2023-10-16 ENCOUNTER — Ambulatory Visit (INDEPENDENT_AMBULATORY_CARE_PROVIDER_SITE_OTHER): Payer: PPO | Admitting: Pharmacist

## 2023-10-16 ENCOUNTER — Encounter: Payer: Self-pay | Admitting: Pharmacist

## 2023-10-16 VITALS — BP 170/82 | HR 72 | Wt 179.0 lb

## 2023-10-16 DIAGNOSIS — I152 Hypertension secondary to endocrine disorders: Secondary | ICD-10-CM

## 2023-10-16 DIAGNOSIS — E1165 Type 2 diabetes mellitus with hyperglycemia: Secondary | ICD-10-CM

## 2023-10-16 DIAGNOSIS — E1159 Type 2 diabetes mellitus with other circulatory complications: Secondary | ICD-10-CM | POA: Diagnosis not present

## 2023-10-16 DIAGNOSIS — Z794 Long term (current) use of insulin: Secondary | ICD-10-CM | POA: Diagnosis not present

## 2023-10-16 MED ORDER — VALSARTAN-HYDROCHLOROTHIAZIDE 320-25 MG PO TABS: 1.0000 | ORAL_TABLET | Freq: Every day | ORAL | 3 refills | Status: DC

## 2023-10-16 MED ORDER — AMLODIPINE BESYLATE 10 MG PO TABS: 10.0000 mg | ORAL_TABLET | Freq: Every day | ORAL | 3 refills | Status: DC

## 2023-10-16 NOTE — Assessment & Plan Note (Signed)
Diabetes longstanding currently uncontrolled, with home reported glucose levels ~100s-200s. Patient is able to verbalize appropriate hypoglycemia management plan. Medication adherence appears good with insulin. Control is suboptimal due to suboptimal medication regimen. - Continued insulin Humalog (insulin lispro) at 12 units twice daily - Continued insulin Basaglar (insulin glargine) 40 units - Continued metformin 500 mg 2 tablets BID -Patient educated on purpose, proper use, and potential adverse effects.  -Extensively discussed pathophysiology of diabetes, recommended lifestyle interventions, dietary effects on blood sugar control.  -Counseled on s/sx of and management of hypoglycemia.  - Ordered - UACR - Consider SGLT2 at next appointment

## 2023-10-16 NOTE — Assessment & Plan Note (Signed)
Hypertension longstanding currently uncontrolled. Blood pressure goal of <130/80 mmHg. Medication adherence good, but had some confusion with medication increases made at last pharmacy visit. Blood pressure control is suboptimal due to suboptimal medication regimen and Patient confusion with regimen. - Started valsartan-hydrochlorothiazide 320-25 mg once daily - Stopped losartan 25 mg (2 tablets) once daily - Stopped hydrochlorothiazide 12.5 mg (2 tablets) once daily - Increased amlodipine from 5 mg once daily to 10 mg once daily

## 2023-10-16 NOTE — Progress Notes (Addendum)
S:     Chief Complaint  Patient presents with   Medication Management    DM, HTN, Left Shoulder Pain   72 y.o. male who presents for diabetes evaluation, education, and management. Patient arrives in good spirits and presents without any assistance. Reporting chronic left shoulder pain radiating down to elbow, and stating that his appointment with Dr. Melissa Noon on 11/05/2023 is too far away due to lack of pain management. Reports he dropped a log on his left foot and injured 3 of his toes, but they are improving and he feels like he has normal range of movement. Also, reports he recently had "cancer" removed off of right side of face.   Patient was last seen by Primary Care Provider, Dr. Melissa Noon, on 08/07/2023.   At last pharmacy visit on 10/02/2023 - Increased Humalog (insulin lispro) from Pt reported 10 units once daily to 10 units twice daily, Continued Basaglar (insulin glargine)at 40 units once daily in the morning, Increased amlodipine from 2.5 mg once daily to 5 mg once daily - new prescription, Increased hydrochlorothiazide from 12.5 mg once daily to 25mg  (2 tablets x 12.5 mg) once daily, Increased losartan from 25 mg once daily to 50 mg (2 tablets X 25 mg) once daily   PMH is significant for T2DM, HTN, HLD, Chronic Left Shoulder Pain.   Patient reports Diabetes was diagnosed in ~25 years ago .   Family/Social History: Grandfather had T2DM   Current diabetes medications include: insulin Humalog (insulin lispro) 10 units twice daily, insulin Basaglar (insulin glargine) 40 units, metformin 500 mg 2 tablets BID Current hypertension medications include: amlodipine 5 mg once daily, hydrochlorothiazide 12.5 mg 2 tablets once daily, losartan 25 mg 2 tablets once daily Current hyperlipidemia medications include: atorvastatin 40 mg once daily  Patient reports adherence to taking all medications as prescribed. Taking 12 units twice daily of insulin Humalog (insulin lispro). Taking 1-2  hydrocodone-acetaminophen (Vicodin) 5-325 mg prn ~1-2x weekly due to shoulder pain.  Do you feel that your medications are working for you? yes Have you been experiencing any side effects to the medications prescribed? no Insurance coverage: [employer]  Patient denies hypoglycemic events. Lowest seen ~82.   Reported home blood sugars: 175 this AM -- even split between 100-200s   Home BP: 175/88 mmHg, rechecked and it was 156/88 mmHg - doesn't regularly take BP readings at home  Patient reports nocturia (nighttime urination). ~ 1 time nightly Patient reports neuropathy (nerve pain). Patient denies visual changes. Patient reports self foot exams.   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  All other systems reviewed and are negative.  Physical Exam Vitals reviewed.  Constitutional:      Appearance: Normal appearance.  Pulmonary:     Effort: Pulmonary effort is normal.  Neurological:     Mental Status: He is alert.  Psychiatric:        Mood and Affect: Mood normal.        Behavior: Behavior normal.        Thought Content: Thought content normal.        Judgment: Judgment normal.    Lab Results  Component Value Date   HGBA1C 13.0 (A) 08/07/2023   Vitals:   10/16/23  0902 10/16/23 0917  BP: (!) 173/82 (!) 170/82  Pulse: 72   SpO2: 99%     Lipid Panel     Component Value Date/Time   CHOL 101 03/31/2023 1601   TRIG 170 (H) 03/31/2023 1601   HDL 43 03/31/2023 1601   CHOLHDL 2.3 03/31/2023 1601   LDLCALC 30 03/31/2023 1601   LDLDIRECT 134 (H) 02/24/2018 1613     A/P: Diabetes longstanding currently uncontrolled, with home reported glucose levels ~100s-200s. Patient is able to  verbalize appropriate hypoglycemia management plan. Medication adherence appears good with insulin. Control is suboptimal due to suboptimal medication regimen. - Continued insulin Humalog (insulin lispro) at 12 units twice daily - Continued insulin Basaglar (insulin glargine) 40 units - Continued metformin 500 mg 2 tablets BID -Patient educated on purpose, proper use, and potential adverse effects.  -Extensively discussed pathophysiology of diabetes, recommended lifestyle interventions, dietary effects on blood sugar control.  -Counseled on s/sx of and management of hypoglycemia.  - Ordered - UACR - Consider SGLT2 at next appointment   ASCVD risk - primary prevention in patient with diabetes. Last LDL is 30 at goal of <70 mg/dL. ASCVD risk factors include T2DM, HTN, HLD.   -Continued atorvastatin 40 mg once daily  Hypertension longstanding currently uncontrolled. Blood pressure goal of <130/80 mmHg. Medication adherence good, but had some confusion with medication increases made at last pharmacy visit. Blood pressure control is suboptimal due to suboptimal medication regimen and Patient confusion with regimen. - Started valsartan-hydrochlorothiazide 320-25 mg once daily - Stopped losartan 25 mg (2 tablets) once daily - Stopped hydrochlorothiazide 12.5 mg (2 tablets) once daily - Increased amlodipine from 5 mg once daily to 10 mg once daily   Left Shoulder Pain longstanding currently uncontrolled with pain radiating down to elbow. 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 Managing pain currently with hydrocodone-acetaminophen (Vicodin) 5-325 mg 1-2 tablets 1-2x weekly.  - Scheduled appointment with Dr. Royal Piedra - tomorrow 11/8 for shoulder corticosteroid injection  Advised patient to call our office for glucose readings consistently > 300 following shoulder injection.     Written patient instructions provided. Patient verbalized  understanding of treatment plan.  Total time in face to face counseling 51 minutes.    Follow-up:  Pharmacist 11/13/2023 PCP clinic visit in 10/17/2023 w/ Dr. Royal Piedra   UACR returns with > 1300 value.  Concern for proteinuria in this patient with long-term diabetes.  Plan to discuss result and implementation of SGLT2 therapy at next visit    Patient seen with Lendon Ka, PharmD Candidate and Shona Simpson, PharmD Candidate.

## 2023-10-16 NOTE — Patient Instructions (Signed)
It was nice to see you today!  Your goal blood sugar is 80-130 before eating and less than 180 after eating.  Medication Changes: START valsartan-hydrochlorothiazide 320-25 mg once daily  -STOP losartan 25 mg (2 tablets) once daily and hydrochlorothiazide 12.5 mg (2 tablets) once daily START amlodipine 10 mg once daily   - You are currently taking 5 mg of amlodipine, you can take two 5 mg tablets until you finish the bottle you have on hand  CONTINUE insulin Humalog (insulin lispro) 12 units two times daily CONTINUE insulin Basaglar (insulin glargine)  40 units once daily  Continue all other medication the same.   Monitor blood sugars at home and keep a log (glucometer or piece of paper) to bring with you to your next visit.  Keep up the good work with diet and exercise. Aim for a diet full of vegetables, fruit and lean meats (chicken, [place], fish). Try to limit salt intake by eating fresh or frozen vegetables (instead of canned), rinse canned vegetables prior to cooking and do not add any additional salt to meals.

## 2023-10-17 ENCOUNTER — Ambulatory Visit (INDEPENDENT_AMBULATORY_CARE_PROVIDER_SITE_OTHER): Payer: PPO | Admitting: Student

## 2023-10-17 ENCOUNTER — Encounter: Payer: Self-pay | Admitting: Student

## 2023-10-17 VITALS — BP 130/75 | HR 77 | Wt 182.6 lb

## 2023-10-17 DIAGNOSIS — M19012 Primary osteoarthritis, left shoulder: Secondary | ICD-10-CM

## 2023-10-17 LAB — MICROALBUMIN / CREATININE URINE RATIO
Creatinine, Urine: 75.7 mg/dL
Microalb/Creat Ratio: 1340 mg/g{creat} — ABNORMAL HIGH (ref 0–29)
Microalbumin, Urine: 1014.1 ug/mL

## 2023-10-17 MED ORDER — METHYLPREDNISOLONE ACETATE 40 MG/ML IJ SUSP
40.0000 mg | Freq: Once | INTRAMUSCULAR | Status: AC
  Administered 2023-10-17: 40 mg via INTRAMUSCULAR

## 2023-10-17 NOTE — Assessment & Plan Note (Addendum)
PROCEDURE: INJECTION: Patient was given informed consent, signed copy in the chart. Appropriate time out was taken. Area prepped and draped in usual sterile fashion. Ethyl chloride was  used for local anesthesia. A 21 gauge 1 1/2 inch needle was used. 1 cc of methylprednisolone 40 mg/ml plus  4 cc of 1% lidocaine without epinephrine was injected into the glenohumeral joint using a(n) posterior approach.   The patient tolerated the procedure well. There were no complications. Post procedure instructions were given.

## 2023-10-17 NOTE — Patient Instructions (Addendum)
Today you received an injection with corticosteroid. This injection is usually done in response to pain and inflammation. There is some "numbing" medicine also in the shot so the injected area may be numb and feel really good for the next couple of hours. The numbing medicine usually wears off in 2-3 hours though, and then your pain level will be right back where it was before the injection.   The actually benefit from the steroid injection is usually noticed in 2-7 days. You may actually experience a small (as in 10%) INCREASE in pain in the first 24 hours---that is common.   Things to watch out for that you should contact us or a health care provider urgently would include: 1. Unusual (as in more than 10%) increase in pain 2. New fever > 101.5 3. New swelling or redness of the injected area.  4. Streaking of red lines around the area injected.  

## 2023-10-17 NOTE — Progress Notes (Signed)
Assessment & Plan Primary osteoarthritis of left shoulder PROCEDURE: INJECTION: Patient was given informed consent, signed copy in the chart. Appropriate time out was taken. Area prepped and draped in usual sterile fashion. Ethyl chloride was  used for local anesthesia. A 21 gauge 1 1/2 inch needle was used. 1 cc of methylprednisolone 40 mg/ml plus  4 cc of 1% lidocaine without epinephrine was injected into the glenohumeral joint using a(n) posterior approach.   The patient tolerated the procedure well. There were no complications. Post procedure instructions were given.

## 2023-10-18 ENCOUNTER — Encounter: Payer: Self-pay | Admitting: Student

## 2023-10-20 NOTE — Progress Notes (Signed)
Reviewed and agree with Dr Koval's plan.   

## 2023-11-05 ENCOUNTER — Encounter: Payer: Self-pay | Admitting: Student

## 2023-11-05 ENCOUNTER — Ambulatory Visit (INDEPENDENT_AMBULATORY_CARE_PROVIDER_SITE_OTHER): Payer: PPO | Admitting: Student

## 2023-11-05 ENCOUNTER — Ambulatory Visit
Admission: RE | Admit: 2023-11-05 | Discharge: 2023-11-05 | Disposition: A | Discharge status: Home / Self Care | Payer: PPO | Source: Ambulatory Visit | Attending: Family Medicine | Admitting: Family Medicine

## 2023-11-05 VITALS — BP 147/61 | HR 78 | Ht 74.0 in | Wt 186.6 lb

## 2023-11-05 DIAGNOSIS — S99922A Unspecified injury of left foot, initial encounter: Secondary | ICD-10-CM | POA: Diagnosis not present

## 2023-11-05 MED ORDER — CEPHALEXIN 500 MG PO CAPS: 500.0000 mg | ORAL_CAPSULE | Freq: Three times a day (TID) | ORAL | 0 refills | Status: AC

## 2023-11-05 NOTE — Patient Instructions (Signed)
It was great seeing you today.  Please go get the x-ray of your foot completed at [facility] imaging at [address] I prescribed you an antibiotic to take in case your toe is infected.  If your x-ray is normal, you can discontinue   If you have any questions or concerns, please feel free to call the clinic.   Have a wonderful day,  Dr. Darral Dash [facility] [phone]

## 2023-11-05 NOTE — Assessment & Plan Note (Signed)
S/p injury 3 weeks ago with reported deformity. Today, he has swelling, erythema and tenderness consistent with possible cellulitis. Given longstanding history of uncontrolled diabetes, I cannot rule out osteomyelitis. Reassuringly, no systemic signs of infection or sepsis. Will get left foot x-ray. Start cephalexin 500 mg 3 times daily for presumed infection until x-ray results.  If fracture is evident, can consider discontinuing antibiotics.

## 2023-11-05 NOTE — Progress Notes (Signed)
    SUBJECTIVE:   CHIEF COMPLAINT / HPI:   Gregory Harrison is a 72 year old male here to discuss left second toe pain. On he says 3 weeks ago he dropped a big piece of wood on his toe when he was doing woodcutting and it was disfigured and immediately painful.  He says his toes then turned to black and blue color. He has been buddy taping the toe to his big toe.  Says the pain is discontinued, and that the toe has not been improving in terms of redness, swelling.  Denies any fevers or chills or other systemic symptoms of infection.    Of note, he has a longstanding history of type 2 diabetes, with most recent A1c 13.0%.  For Thanksgiving, they are planning on doing a fish fry and cooking at the house.   PERTINENT  PMH / PSH: T2DM emphysema, diabetic neuropathy, chronic pain syndrome secondary to arthritis  OBJECTIVE:   BP (!) 147/61   Pulse 78   Ht 6\' 2"  (1.88 m)   Wt 186 lb 9.6 oz (84.6 kg)   SpO2 99%   BMI 23.96 kg/m   General: Pleasant, well-appearing and well-nourished elderly male in no distress, ambulates independently with normal gait Cardiac: Regular rate and rhythm Respiratory: Normal work of breathing on room air, lungs are clear in all fields. MSK: Left second toe swollen, erythematous with small area of healed ulceration.  No drainage or purulence.  Neurovascularly intact.  Distal pulses easily palpable bilaterally in lower extremities.  See image below.       ASSESSMENT/PLAN:   Injury of left toe S/p injury 3 weeks ago with reported deformity. Today, he has swelling, erythema and tenderness consistent with possible cellulitis. Given longstanding history of uncontrolled diabetes, I cannot rule out osteomyelitis. Reassuringly, no systemic signs of infection or sepsis. Will get left foot x-ray. Start cephalexin 500 mg 3 times daily for presumed infection until x-ray results.  If fracture is evident, can consider discontinuing antibiotics.   Close follow-up in  1 week.  Darral Dash, DO [facility]

## 2023-11-05 NOTE — Progress Notes (Deleted)
    SUBJECTIVE:   CHIEF COMPLAINT / HPI:   Gregory Harrison is here to discuss left shoulder pain. He had a corticosteroid injection that helped for 2 days, but then he started having pain again.  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   His blood sugar was 115, his blood sugar was 280  PERTINENT  PMH / PSH: ***  OBJECTIVE:   BP (!) 147/61   Pulse 78   Ht 6\' 2"  (1.88 m)   Wt 186 lb 9.6 oz (84.6 kg)   SpO2 99%   BMI 23.96 kg/m  ***   ASSESSMENT/PLAN:   No problem-specific Assessment & Plan notes found for this encounter.     Darral Dash, DO [facility]    {    This will disappear when note is signed, click to select method of visit    :1}

## 2023-11-13 ENCOUNTER — Telehealth: Payer: Self-pay | Admitting: Student

## 2023-11-13 ENCOUNTER — Encounter: Payer: Self-pay | Admitting: Pharmacist

## 2023-11-13 ENCOUNTER — Ambulatory Visit: Payer: PPO | Admitting: Pharmacist

## 2023-11-13 VITALS — BP 135/59 | HR 72 | Wt 186.0 lb

## 2023-11-13 DIAGNOSIS — E1159 Type 2 diabetes mellitus with other circulatory complications: Secondary | ICD-10-CM | POA: Diagnosis not present

## 2023-11-13 DIAGNOSIS — I152 Hypertension secondary to endocrine disorders: Secondary | ICD-10-CM

## 2023-11-13 DIAGNOSIS — Z794 Long term (current) use of insulin: Secondary | ICD-10-CM | POA: Diagnosis not present

## 2023-11-13 DIAGNOSIS — E1165 Type 2 diabetes mellitus with hyperglycemia: Secondary | ICD-10-CM

## 2023-11-13 MED ORDER — EMPAGLIFLOZIN 25 MG PO TABS: 25.0000 mg | ORAL_TABLET | Freq: Every day | ORAL | 3 refills | Status: DC

## 2023-11-13 MED ORDER — EMPAGLIFLOZIN 25 MG PO TABS: 25.0000 mg | ORAL_TABLET | Freq: Every day | ORAL | Status: DC

## 2023-11-13 NOTE — Progress Notes (Signed)
S:     Chief Complaint  Patient presents with   Medication Management    diabetes   72 y.o. male who presents for diabetes evaluation, education, and management. Patient arrives in fair-good spirits and presents without any assistance. States that his toe and shoulder are still painful.   Patient was referred and last seen by Primary Care Provider, Dr. Melissa Noon, on 11/05/2023.   PMH is significant for T2DM, HTN, HLD, Chronic Left Shoulder Pain. .  At last visit, valsartan/hydrochlorothiazide was prescribed in place of two prescriptions and bolus insulin dose was continued at 12 units with meals twice daily.   Current diabetes medications include:  insulin Humalog (insulin lispro) 12 units twice daily, insulin Basaglar (insulin glargine) 40 units, metformin 500 mg 2 tablets BID  Current hypertension medications include: valsartan hydrochlorothiazide 320/25mg  once daily. Current hyperlipidemia medications include: atorvastatin 40mg  once daily  Patient reports adherence to taking all medications as prescribed.   Patient reports one hypoglycemic events which was managed effectively.  Reported home fasting blood sugars: mid 100s and some higher following recent should injection.     Patient reports neuropathy (nerve pain) treated with gabapentin Taken Jardiance (empagliflozin in the past) - states it worked well but was too expensive.   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  Neurological:  Positive for sensory change (feet burning at night).    Physical Exam Constitutional:      Appearance: Normal appearance.  Pulmonary:     Effort: Pulmonary effort is normal.  Neurological:     Mental Status: He is alert.  Psychiatric:        Mood and Affect: Mood normal.        Behavior: Behavior normal.        Thought Content: Thought content normal.   Home glucose reading reported  as "better"  Lab Results  Component Value Date   HGBA1C 13.0 (A) 08/07/2023   Vitals:   11/13/23 0946  BP: (!) 135/59  Pulse: 72  SpO2: 98%    Lipid Panel     Component Value Date/Time   CHOL 101 03/31/2023 1601   TRIG 170 (H) 03/31/2023 1601   HDL 43 03/31/2023 1601   CHOLHDL 2.3 03/31/2023 1601   LDLCALC 30 03/31/2023 1601   LDLDIRECT 134 (H) 02/24/2018 1613    A/P: Diabetes longstanding currently improved control with use of insulin therapy however recent steroid injection has worsed most recent control. Patient is able to verbalize appropriate hypoglycemia management plan. Medication adherence appears good. Reviewed UACR result and glucose control.  Patient willing to restart SGLT-2 therapy.  -Continued basal insulin Basaglar (insulin glargine) 40 units once daily.  -Continued rapid insulin Humalog (insulin lispro) 12 units twice daily with meals  -Restarted SGLT2-I Jardiance (empagliflozin) 25 mg. Sample provided. Counseled on sick day rules. -Continued metformin 1000mg  BID.  -Patient educated on purpose, proper use, and potential adverse effects.  -Extensively discussed pathophysiology of diabetes, recommended lifestyle interventions, dietary effects on blood sugar control.  -Counseled on s/sx of and management of hypoglycemia.   Hypertension longstanding currently improved control following blood pressure medication change at last visit. Blood pressure goal of <140 mmHg and possibly lower if tolerated. Medication adherence appears good.  -Continued valsartan 320/hydrochlorothiazide 25mg  once daily and amlodipine 10mg  daily.  Written patient instructions provided. Patient verbalized understanding of treatment plan.  Total time in face to face counseling 29  minutes.    Follow-up:  Pharmacist PRN PCP clinic visit in 8 days for review of toe and shoulder pain and discussion of X-ray results

## 2023-11-13 NOTE — Assessment & Plan Note (Signed)
Hypertension longstanding currently improved control following blood pressure medication change at last visit. Blood pressure goal of <140 mmHg and possibly lower if tolerated. Medication adherence appears good.  -Continued valsartan 320/hydrochlorothiazide 25mg  once daily and amlodipine 10mg  daily.

## 2023-11-13 NOTE — Assessment & Plan Note (Signed)
Diabetes longstanding currently improved control with use of insulin therapy however recent steroid injection has worsed most recent control. Patient is able to verbalize appropriate hypoglycemia management plan. Medication adherence appears good. Reviewed UACR result and glucose control.  Patient willing to restart SGLT-2 therapy.  -Continued basal insulin Basaglar (insulin glargine) 40 units once daily.  -Continued rapid insulin Humalog (insulin lispro) 12 units twice daily with meals  -Restarted SGLT2-I Jardiance (empagliflozin) 25 mg. Sample provided. Counseled on sick day rules. -Continued metformin 1000mg  BID.  -Patient educated on purpose, proper use, and potential adverse effects.  -Extensively discussed pathophysiology of diabetes, recommended lifestyle interventions, dietary effects on blood sugar control.  -Counseled on s/sx of and management of hypoglycemia.

## 2023-11-13 NOTE — Patient Instructions (Signed)
It was nice to see you today!  Your goal blood sugar is 80-130 before eating and less than 180 after eating.  Medication Changes: Re-START  Jardiance (empagliflozin) 25 mg once daily  Continue all other medication the same.   Monitor blood sugars at home and keep a log (glucometer or piece of paper) to bring with you to your next visit.  Keep up the good work with diet and exercise. Aim for a diet full of vegetables, fruit and lean meats (chicken, [place], fish). Try to limit salt intake by eating fresh or frozen vegetables (instead of canned), rinse canned vegetables prior to cooking and do not add any additional salt to meals.

## 2023-11-13 NOTE — Telephone Encounter (Signed)
I reviewed X-ray foot, no read from radiology yet but based on my read it appears he has a left foot 2nd intermediate phalynx fracture.  Given persistence of pain, will refer to orthopedics. Recommend continued taping for now. Will call patient with plan in the morning.

## 2023-11-14 NOTE — Progress Notes (Signed)
Reviewed and agree with Dr Koval's plan.   

## 2023-11-18 ENCOUNTER — Ambulatory Visit
Payer: PPO | Attending: Student in an Organized Health Care Education/Training Program | Admitting: Student in an Organized Health Care Education/Training Program

## 2023-11-18 ENCOUNTER — Encounter: Payer: Self-pay | Admitting: Student in an Organized Health Care Education/Training Program

## 2023-11-18 VITALS — BP 150/73 | HR 65 | Temp 97.3°F | Resp 16 | Ht 74.0 in | Wt 182.0 lb

## 2023-11-18 DIAGNOSIS — G5601 Carpal tunnel syndrome, right upper limb: Secondary | ICD-10-CM | POA: Diagnosis present

## 2023-11-18 DIAGNOSIS — E1142 Type 2 diabetes mellitus with diabetic polyneuropathy: Secondary | ICD-10-CM | POA: Diagnosis present

## 2023-11-18 DIAGNOSIS — M25512 Pain in left shoulder: Secondary | ICD-10-CM | POA: Diagnosis present

## 2023-11-18 DIAGNOSIS — G8929 Other chronic pain: Secondary | ICD-10-CM | POA: Diagnosis present

## 2023-11-18 DIAGNOSIS — G894 Chronic pain syndrome: Secondary | ICD-10-CM | POA: Diagnosis present

## 2023-11-18 DIAGNOSIS — M19012 Primary osteoarthritis, left shoulder: Secondary | ICD-10-CM | POA: Insufficient documentation

## 2023-11-18 DIAGNOSIS — Z794 Long term (current) use of insulin: Secondary | ICD-10-CM

## 2023-11-18 MED ORDER — HYDROCODONE-ACETAMINOPHEN 5-325 MG PO TABS: 1.0000 | ORAL_TABLET | Freq: Two times a day (BID) | ORAL | 0 refills | Status: AC | PRN

## 2023-11-18 MED ORDER — GABAPENTIN 400 MG PO CAPS: 1200.0000 mg | ORAL_CAPSULE | Freq: Every day | ORAL | 3 refills | Status: DC

## 2023-11-18 MED ORDER — HYDROCODONE-ACETAMINOPHEN 5-325 MG PO TABS: 1.0000 | ORAL_TABLET | Freq: Two times a day (BID) | ORAL | 0 refills | Status: DC | PRN

## 2023-11-18 NOTE — Progress Notes (Signed)
Nursing Pain Medication Assessment:  Safety precautions to be maintained throughout the outpatient stay will include: orient to surroundings, keep bed in low position, maintain call bell within reach at all times, provide assistance with transfer out of bed and ambulation.  Medication Inspection Compliance: Pill count conducted under aseptic conditions, in front of the patient. Neither the pills nor the bottle was removed from the patient's sight at any time. Once count was completed pills were immediately returned to the patient in their original bottle.  Medication: Hydrocodone/APAP Pill/Patch Count:  03 of 30 pills remain Pill/Patch Appearance: Markings consistent with prescribed medication Bottle Appearance: Standard pharmacy container. Clearly labeled. Filled Date: 75 / 18 / 2024 Last Medication intake:  Today Safety precautions to be maintained throughout the outpatient stay will include: orient to surroundings, keep bed in low position, maintain call bell within reach at all times, provide assistance with transfer out of bed and ambulation.   Med list may not be accurate, patient does not know the names of the meds he takes.

## 2023-11-18 NOTE — Patient Instructions (Addendum)
Hydrocodone fills 11-18-24 12-19-23 01-18-24

## 2023-11-18 NOTE — Progress Notes (Signed)
PROVIDER NOTE: Information contained herein reflects review and annotations entered in association with encounter. Interpretation of such information and data should be left to medically-trained personnel. Information provided to patient can be located elsewhere in the medical record under "Patient Instructions". Document created using STT-dictation technology, any transcriptional errors that may result from process are unintentional.    Patient: Gregory Harrison  Service Category: E/M  Provider: Edward Jolly, MD  DOB: March 01, 1951  DOS: 11/18/2023  Referring Provider: Darral Dash, DO  MRN: [id number]  Specialty: Interventional Pain Management  PCP: Darral Dash, DO  Type: Established Patient  Setting: Ambulatory outpatient    Location: Office  Delivery: Face-to-face     HPI  Gregory Harrison, a 72 y.o. year old male, is here today because of his Chronic pain syndrome [G89.4]. Gregory Harrison primary complain today is Shoulder Pain (bilateral), Knee Pain (right), and Back Pain (lower)  Pertinent problems: Gregory Harrison has Type 2 diabetes mellitus with hyperglycemia (HCC); Carpal tunnel syndrome of right wrist; Lumbar facet arthropathy; Lumbar degenerative disc disease; Bilateral primary osteoarthritis of knee; and Primary osteoarthritis of left shoulder on their pertinent problem list. Pain Assessment: Severity of Chronic pain is reported as a 9 /10. Location: Shoulder Right, Left/ . Onset: More than a month ago. Quality: Sharp. Timing: Constant. Modifying factor(s): injection, Hydrocodone. Vitals:  height is 6\' 2"  (1.88 m) and weight is 182 lb (82.6 kg). His temporal temperature is 97.3 F (36.3 C) (abnormal). His blood pressure is 150/73 (abnormal) and his pulse is 65. His respiration is 16 and oxygen saturation is 97%.  BMI: Estimated body mass index is 23.37 kg/m as calculated from the following:   Height as of this encounter: 6\' 2"  (1.88 m).   Weight as of this encounter: 182 lb (82.6 kg). Last  encounter: 08/19/2023. Last procedure: Visit date not found.  Reason for encounter: medication management.   History of Present Illness   Gregory Harrison, a patient with a history of diabetes and shoulder pain, presents with several recent health issues. He reports an injury to his toe after dropping a log on it, which resulted in significant pain and swelling. Despite initial treatment with antibiotics from his family medicine doctor, the pain persists.  In addition to the toe injury, he had two recent skin cancer excisions, one on the forehead and another on the shoulder. The shoulder, in particular, has been a source of ongoing discomfort, described as indescribable pain. He received a tetanus shot a week ago and a shoulder injection in early November, which only provided two days of relief.  He has been taking pain medication for the discomfort in his toe and shoulder, with only three pills remaining. He expresses a desire to increase his prescription over the winter months, with a plan to reduce the dosage in the spring.  He also reports stiffness in his left shoulder and occasional burning in his foot, which he manages with gabapentin. He notes that the burning sensation in his foot has lessened recently. Despite these challenges, he continues to split wood for heat, which puts strain on his arm and shoulder.       Pharmacotherapy Assessment  Analgesic: Hydrocodone 5 mg twice daily as needed, quantity 60/month MME equals 10    Monitoring: [place] PMP: PDMP reviewed during this encounter.       Pharmacotherapy: No side-effects or adverse reactions reported. Compliance: No problems identified. Effectiveness: Clinically acceptable.  Concepcion Elk, RN  11/18/2023  8:37 AM  Sign  when Signing Visit Nursing Pain Medication Assessment:  Safety precautions to be maintained throughout the outpatient stay will include: orient to surroundings, keep bed in low position, maintain call bell within reach at  all times, provide assistance with transfer out of bed and ambulation.  Medication Inspection Compliance: Pill count conducted under aseptic conditions, in front of the patient. Neither the pills nor the bottle was removed from the patient's sight at any time. Once count was completed pills were immediately returned to the patient in their original bottle.  Medication: Hydrocodone/APAP Pill/Patch Count:  03 of 30 pills remain Pill/Patch Appearance: Markings consistent with prescribed medication Bottle Appearance: Standard pharmacy container. Clearly labeled. Filled Date: 75 / 18 / 2024 Last Medication intake:  Today Safety precautions to be maintained throughout the outpatient stay will include: orient to surroundings, keep bed in low position, maintain call bell within reach at all times, provide assistance with transfer out of bed and ambulation.   Med list may not be accurate, patient does not know the names of the meds he takes.  No results found for: "CBDTHCR" No results found for: "D8THCCBX" No results found for: "D9THCCBX"  UDS:  Summary  Date Value Ref Range Status  03/14/2022 Note  Final    Comment:    ==================================================================== ToxASSU[employer] Select 13 (MW) ==================================================================== Test                             Result       Flag       Units  Drug Absent but Declared for Prescription Verification   Hydrocodone                    Not Detected UNEXPECTED ng/mg creat ==================================================================== Test                      Result    Flag   Units      Ref Range   Creatinine              45               mg/dL      >=56 ==================================================================== Declared Medications:  The flagging and interpretation on this report are based on the  following declared medications.  Unexpected results may arise from  inaccuracies in  the declared medications.   **Note: The testing scope of this panel includes these medications:   Hydrocodone (Norco)   **Note: The testing scope of this panel does not include the  following reported medications:   Acetaminophen (Norco)  Amlodipine (Norvasc)  Atorvastatin (Lipitor)  Gabapentin (Neurontin)  Hydrochlorothiazide (Hydrodiuril)  Insulin (Basaglar)  Losartan (Cozaar)  Meloxicam (Mobic)  Metformin (Glucophage)  Sitagliptin (Januvia)  Topical ==================================================================== For clinical consultation, please call [phone]. ====================================================================       ROS  Constitutional: Denies any fever or chills Gastrointestinal: No reported hemesis, hematochezia, vomiting, or acute GI distress Musculoskeletal:  as above Neurological: No reported episodes of acute onset apraxia, aphasia, dysarthria, agnosia, amnesia, paralysis, loss of coordination, or loss of consciousness  Medication Review  Alcohol Swabs, Basaglar KwikPen, Emollient, HYDROcodone-acetaminophen, Insulin Pen Needle, ONE TOUCH DELICA LANCING DEV, OneTouch Delica Lancets Fine, OneTouch Verio, amLODipine, atorvastatin, blood glucose meter kit and supplies, empagliflozin, gabapentin, glucose blood, insulin lispro, metFORMIN, and valsartan-hydrochlorothiazide  History Review  Allergy: Gregory Harrison has No Known Allergies. Drug: Gregory Harrison  reports that he does not currently use drugs  after having used the following drugs: Marijuana. Alcohol:  reports that he does not currently use alcohol. Tobacco:  reports that he quit smoking about 35 years ago. His smoking use included cigarettes. He started smoking about 55 years ago. He has a 40 pack-year smoking history. He has never used smokeless tobacco. Social: Gregory Harrison  reports that he quit smoking about 35 years ago. His smoking use included cigarettes. He started smoking about 55 years  ago. He has a 40 pack-year smoking history. He has never used smokeless tobacco. He reports that he does not currently use alcohol. He reports that he does not currently use drugs after having used the following drugs: Marijuana. Medical:  has a past medical history of Arthritis, Back skin lesion (03/12/2022), Biceps tendinitis, left (05/19/2023), Cancer (HCC) (2023), Chronic left shoulder pain (02/26/2018), Diabetes (HCC) (2000), Diabetic neuropathy (HCC), GERD (gastroesophageal reflux disease), Hand lesion (05/09/2022), HTN (hypertension) (2010), Hypercholesterolemia, Impingement of right shoulder (07/30/2022), Pain in joint, shoulder region (03/24/2019), Post-viral cough syndrome (10/20/2020), Rotator cuff impingement syndrome, left (05/19/2023), Solitary pulmonary nodule on lung CT (08/19/2022), Subacute cough (08/09/2022), Trigger finger of right thumb (06/04/2018), Trigger finger, right little finger (01/15/2020), Vision problem, and Weight loss (07/16/2019). Surgical: Gregory Harrison  has a past surgical history that includes Hand surgery (1980s); Nose surgery (1979); Eye surgery (Left, 1990s); Tonsillectomy; and Cholecystectomy (N/A, 10/16/2022). Family: family history includes ALS in his brother; Diabetes in his paternal grandfather; Heart attack in his father; Heart murmur in his father; Other in his mother.  Laboratory Chemistry Profile   Renal Lab Results  Component Value Date   BUN 15 08/07/2023   CREATININE 0.80 08/07/2023   BCR 19 08/07/2023   GFRAA 88 10/19/2020   GFRNONAA >60 10/08/2022    Hepatic Lab Results  Component Value Date   AST 12 08/07/2023   ALT 15 08/07/2023   ALBUMIN 4.2 08/07/2023   ALKPHOS 72 08/07/2023    Electrolytes Lab Results  Component Value Date   NA 139 08/07/2023   K 4.0 08/07/2023   CL 100 08/07/2023   CALCIUM 10.0 08/07/2023    Bone No results found for: "VD25OH", "VD125OH2TOT", "ZO1096EA5", "WU9811BJ4", "25OHVITD1", "25OHVITD2", "25OHVITD3",  "TESTOFREE", "TESTOSTERONE"  Inflammation (CRP: Acute Phase) (ESR: Chronic Phase) Lab Results  Component Value Date   ESRSEDRATE 20 08/08/2022         Note: Above Lab results reviewed.  Recent Imaging Review  CT Chest Wo Contrast CLINICAL DATA:  Follow-up lung nodule.  EXAM: CT CHEST WITHOUT CONTRAST  TECHNIQUE: Multidetector CT imaging of the chest was performed following the standard protocol without IV contrast.  RADIATION DOSE REDUCTION: This exam was performed according to the departmental dose-optimization program which includes automated exposure control, adjustment of the mA and/or kV according to patient size and/or use of iterative reconstruction technique.  COMPARISON:  08/14/2022 and 08/12/2007. A lung cancer screening exam from 02/13/2021 is not available for review.  FINDINGS: Cardiovascular: Heart is normal in size and configuration. No pericardial effusion. Mild left and right coronary artery calcifications. Great vessels normal in caliber. Stable aortic atherosclerotic calcifications.  Mediastinum/Nodes: No neck base, mediastinal or hilar masses. No enlarged lymph nodes. Trachea and esophagus unremarkable.  Lungs/Pleura: Ground-glass nodule, measuring 9 x 6 mm, mean 8 mm, left upper lobe, image 61, series 4, unchanged from the prior study.  No other nodules. Mild opacity in the posteromedial right upper lobe and right lower lobe is similar to the prior CT consistent with subsegmental atelectasis and/or scarring.  Remainder  of the lungs is essentially clear. No pleural effusion or pneumothorax.  Upper Abdomen: No acute findings. Status post cholecystectomy since the prior CT. Aortic atherosclerosis.  Musculoskeletal: No fracture or acute finding. No bone lesion. No chest wall mass.  IMPRESSION: 1. No acute findings. 2. 8 mm left upper lobe nodule, unchanged from the CT from 08/14/2022. This supports a benign etiology, but additional follow-up  to establish longer term stability is recommended. Recommend repeat unenhanced chest CT in 1 year. 3. Coronary artery calcifications and aortic atherosclerosis.  Aortic Atherosclerosis (ICD10-I70.0).  Electronically Signed   By: Amie Portland M.D.   On: 09/05/2023 11:41 Note: Reviewed        Physical Exam  General appearance: Well nourished, well developed, and well hydrated. In no apparent acute distress Mental status: Alert, oriented x 3 (person, place, & time)       Respiratory: No evidence of acute respiratory distress Eyes: PERLA Vitals: BP (!) 150/73   Pulse 65   Temp (!) 97.3 F (36.3 C) (Temporal)   Resp 16   Ht 6\' 2"  (1.88 m)   Wt 182 lb (82.6 kg)   SpO2 97%   BMI 23.37 kg/m  BMI: Estimated body mass index is 23.37 kg/m as calculated from the following:   Height as of this encounter: 6\' 2"  (1.88 m).   Weight as of this encounter: 182 lb (82.6 kg). Ideal: Ideal body weight: 82.2 kg (181 lb 3.5 oz) Adjusted ideal body weight: 82.3 kg (181 lb 8.5 oz)  Left shoulder pain, limited ROM  Assessment   Diagnosis Status  1. Chronic pain syndrome   2. Chronic left shoulder pain   3. Carpal tunnel syndrome of right wrist   4. Primary osteoarthritis of left shoulder   5. Diabetic polyneuropathy associated with type 2 diabetes mellitus (HCC)   6. Type 2 diabetes mellitus with diabetic polyneuropathy, without long-term current use of insulin (HCC)    Controlled Controlled Controlled   Updated Problems: No problems updated.  Plan of Care  Problem-specific:  Assessment and Plan    Left Shoulder Pain   Chronic left shoulder pain with stiffness has been troubling him, with a cortisone injection in early November providing only temporary relief for two days. He experiences significant pain that interferes with daily activities, such as splitting wood for heat. We discussed the adverse effects of repeated cortisone injections on diabetes management, leading to his  preference to avoid them due to potential impacts on diabetes. We will increase hydrocodone to BID prn for the next three months, then plan to reduce it in the spring. Additionally, we will refill gabapentin 1200 mg for variable dosing at bedtime.  Toe Injury   He sustained an acute toe injury from dropping a log, causing significant pain, swelling, and scab formation. Despite antibiotic treatment, the pain persists, raising concerns for potential complications due to diabetes. He reports the injury is healing but remains painful. We advised on wound care and infection prevention, considering his diabetes.  Diabetes Mellitus   His diabetes management is showing improvement, with blood glucose levels around 123 mg/dL under Dr. Enos Fling care. We discussed how treatments like cortisone injections could impact diabetes control. He understands the importance of avoiding repeated cortisone injections to prevent potential adverse effects on diabetes. The plan is to continue the current diabetes management plan with Dr. Vonita Moss and avoid repeated cortisone injections.  General Health Maintenance   He received a tetanus shot a week ago. We emphasized the importance of  careful wound management and advised on keeping feet warm to prevent issues related to blood flow, considering his diabetes. Ensuring proper wound care and monitoring for infections, along with advice on keeping feet warm due to potential blood flow issues, are part of the plan.  Follow-up   He is scheduled to follow up with his diabetes doctor on Friday and with his family medicine doctor for further management of shoulder pain.       Gregory Harrison has a current medication list which includes the following long-term medication(s): amlodipine, atorvastatin, basaglar kwikpen, insulin lispro, valsartan-hydrochlorothiazide, and gabapentin.  Pharmacotherapy (Medications Ordered): Meds ordered this encounter  Medications    HYDROcodone-acetaminophen (NORCO/VICODIN) 5-325 MG tablet    Sig: Take 1 tablet by mouth every 12 (twelve) hours as needed for severe pain (pain score 7-10). Must last 30 days.    Dispense:  60 tablet    Refill:  0    Chronic Pain: STOP Act (Not applicable) Fill 1 day early if closed on refill date. Avoid benzodiazepines within 8 hours of opioids   HYDROcodone-acetaminophen (NORCO/VICODIN) 5-325 MG tablet    Sig: Take 1 tablet by mouth every 12 (twelve) hours as needed for severe pain (pain score 7-10). Must last 30 days.    Dispense:  60 tablet    Refill:  0    Chronic Pain: STOP Act (Not applicable) Fill 1 day early if closed on refill date. Avoid benzodiazepines within 8 hours of opioids   HYDROcodone-acetaminophen (NORCO/VICODIN) 5-325 MG tablet    Sig: Take 1 tablet by mouth every 12 (twelve) hours as needed for severe pain (pain score 7-10). Must last 30 days.    Dispense:  60 tablet    Refill:  0    Chronic Pain: STOP Act (Not applicable) Fill 1 day early if closed on refill date. Avoid benzodiazepines within 8 hours of opioids   gabapentin (NEURONTIN) 400 MG capsule    Sig: Take 3 capsules (1,200 mg total) by mouth at bedtime.    Dispense:  270 capsule    Refill:  3   Orders:  Orders Placed This Encounter  Procedures   ToxASSURE Select 13 (MW), Urine    Volume: 30 ml(s). Minimum 3 ml of urine is needed. Document temperature of fresh sample. Indications: Long term (current) use of opiate analgesic (Z61.096)    Order Specific Question:   Release to patient    Answer:   Immediate   Follow-up plan:   Return in about 3 months (around 02/16/2024) for MM, F2F.     Recent Visits No visits were found meeting these conditions. Showing recent visits within past 90 days and meeting all other requirements Today's Visits Date Type Provider Dept  11/18/23 Office Visit Edward Jolly, MD [facility]  Showing today's visits and meeting all other requirements Future  Appointments Date Type Provider Dept  02/12/24 Appointment Edward Jolly, MD [facility]  Showing future appointments within next 90 days and meeting all other requirements  I discussed the assessment and treatment plan with the patient. The patient was provided an opportunity to ask questions and all were answered. The patient agreed with the plan and demonstrated an understanding of the instructions.  Patient advised to call back or seek an in-person evaluation if the symptoms or condition worsens.  Duration of encounter: .  Total time on encounter, as per AMA guidelines included both the face-to-face and non-face-to-face time personally spent by the physician and/or other qualified health care  professional(s) on the day of the encounter (includes time in activities that require the physician or other qualified health care professional and does not include time in activities normally performed by clinical staff). Physician's time may include the following activities when performed: Preparing to see the patient (e.g., pre-charting review of records, searching for previously ordered imaging, lab work, and nerve conduction tests) Review of prior analgesic pharmacotherapies. Reviewing PMP Interpreting ordered tests (e.g., lab work, imaging, nerve conduction tests) Performing post-procedure evaluations, including interpretation of diagnostic procedures Obtaining and/or reviewing separately obtained history Performing a medically appropriate examination and/or evaluation Counseling and educating the patient/family/caregiver Ordering medications, tests, or procedures Referring and communicating with other health care professionals (when not separately reported) Documenting clinical information in the electronic or other health record Independently interpreting results (not separately reported) and communicating results to the patient/ family/caregiver Care coordination (not  separately reported)  Note by: Edward Jolly, MD Date: 11/18/2023; Time: 10:06 AM

## 2023-11-20 ENCOUNTER — Other Ambulatory Visit: Payer: Self-pay | Admitting: Student

## 2023-11-20 DIAGNOSIS — S99922S Unspecified injury of left foot, sequela: Secondary | ICD-10-CM

## 2023-11-21 ENCOUNTER — Ambulatory Visit (INDEPENDENT_AMBULATORY_CARE_PROVIDER_SITE_OTHER): Payer: PPO | Admitting: Student

## 2023-11-21 VITALS — BP 131/80 | HR 63 | Ht 74.0 in | Wt 185.0 lb

## 2023-11-21 DIAGNOSIS — S92912D Unspecified fracture of left toe(s), subsequent encounter for fracture with routine healing: Secondary | ICD-10-CM | POA: Diagnosis not present

## 2023-11-21 DIAGNOSIS — Z794 Long term (current) use of insulin: Secondary | ICD-10-CM | POA: Diagnosis not present

## 2023-11-21 DIAGNOSIS — E1165 Type 2 diabetes mellitus with hyperglycemia: Secondary | ICD-10-CM

## 2023-11-21 LAB — POCT GLYCOSYLATED HEMOGLOBIN (HGB A1C): HbA1c, POC (controlled diabetic range): 10.7 % — AB (ref 0.0–7.0)

## 2023-11-21 NOTE — Progress Notes (Unsigned)
    SUBJECTIVE:   CHIEF COMPLAINT / HPI:   Gregory Harrison is a 72 year-old male here for follow-up of left toe pain and review of X-ray results.  Found to have fracture on X-ray. Says his pain is completely improved.  He says he does still have some swelling in his toe. Not taking any pain medicine. Able to walk as usual. Still doing lots of woodcutting.  T2DM A1c 10.7%.  Just restarted Jardiance 25 mg daily. Taking Metformin 1,000 mg BID Using 40 units insulin (basaglar) daily and insulin humalog 12 units BID  PERTINENT  PMH / PSH: ***  OBJECTIVE:   BP 131/80   Pulse 63   Ht 6\' 2"  (1.88 m)   Wt 185 lb (83.9 kg)   SpO2 97%   BMI 23.75 kg/m  ***   ASSESSMENT/PLAN:   No problem-specific Assessment & Plan notes found for this encounter.     Darral Dash, DO [facility] [facility]    {    This will disappear when note is signed, click to select method of visit    :1}

## 2023-11-21 NOTE — Patient Instructions (Addendum)
It was great seeing you today.  As we discussed, - Continue to buddy tape your toe. Also apply ice for 10 minutes 3-4 times per day - Your toe is healing very well - Continue taking your diabetes medicines as prescribed. Avoid sugary foods over the holidays.   If you have any questions or concerns, please feel free to call the clinic.   Have a wonderful day,  Dr. Darral Dash [facility], [facility] [phone]

## 2023-11-22 DIAGNOSIS — S92919A Unspecified fracture of unspecified toe(s), initial encounter for closed fracture: Secondary | ICD-10-CM | POA: Insufficient documentation

## 2023-11-22 NOTE — Assessment & Plan Note (Addendum)
Fracture of the left second middle phalanx, healing well and seen on x-ray. Encouraged to use ice as needed for swelling and wear shoes with a wide toe box. Encourage safety when woodcutting air using other heavy machinery as to avoid further injury. I had initially gotten the x-ray for concerned about osteomyelitis/also with concern for acute bony abnormality, and I am reassured to see that he does not have any wounds or other concerning features for infection at this time given his uncontrolled diabetes. Does not need orthopedic follow-up at this time.

## 2023-11-22 NOTE — Assessment & Plan Note (Signed)
A1c still not at goal of 7 to 8%.  Today it is a 10.7%, which is continuing to improve reassuringly. Adherence is improving, and I anticipate that his glycemic control will follow suit. Jardiance 25 mg was just restarted a few days ago. Continue metformin 1000 mg twice daily, basal insulin Basaglar (insulin glargine) 40 units once daily, and rapid insulin Humalog (insulin with lispro) 12 units twice daily with meals. Will follow-up after the holidays and see how he is doing.

## 2023-11-24 LAB — TOXASSURE SELECT 13 (MW), URINE

## 2023-12-20 ENCOUNTER — Other Ambulatory Visit: Payer: Self-pay | Admitting: Student

## 2024-02-12 ENCOUNTER — Encounter: Payer: Self-pay | Admitting: Student in an Organized Health Care Education/Training Program

## 2024-02-12 ENCOUNTER — Ambulatory Visit
Payer: PPO | Attending: Student in an Organized Health Care Education/Training Program | Admitting: Student in an Organized Health Care Education/Training Program

## 2024-02-12 VITALS — BP 162/73 | HR 62 | Temp 97.2°F | Ht 74.0 in | Wt 180.0 lb

## 2024-02-12 DIAGNOSIS — M19012 Primary osteoarthritis, left shoulder: Secondary | ICD-10-CM

## 2024-02-12 DIAGNOSIS — M25512 Pain in left shoulder: Secondary | ICD-10-CM | POA: Diagnosis present

## 2024-02-12 DIAGNOSIS — E1142 Type 2 diabetes mellitus with diabetic polyneuropathy: Secondary | ICD-10-CM

## 2024-02-12 DIAGNOSIS — M47816 Spondylosis without myelopathy or radiculopathy, lumbar region: Secondary | ICD-10-CM | POA: Diagnosis present

## 2024-02-12 DIAGNOSIS — G894 Chronic pain syndrome: Secondary | ICD-10-CM

## 2024-02-12 DIAGNOSIS — G8929 Other chronic pain: Secondary | ICD-10-CM | POA: Insufficient documentation

## 2024-02-12 DIAGNOSIS — Z794 Long term (current) use of insulin: Secondary | ICD-10-CM

## 2024-02-12 MED ORDER — HYDROCODONE-ACETAMINOPHEN 5-325 MG PO TABS: 1.0000 | ORAL_TABLET | Freq: Two times a day (BID) | ORAL | 0 refills | Status: DC | PRN

## 2024-02-12 MED ORDER — HYDROCODONE-ACETAMINOPHEN 5-325 MG PO TABS: 1.0000 | ORAL_TABLET | Freq: Two times a day (BID) | ORAL | 0 refills | Status: AC | PRN

## 2024-02-12 NOTE — Progress Notes (Signed)
 Safety precautions to be maintained throughout the outpatient stay will include: orient to surroundings, keep bed in low position, maintain call bell within reach at all times, provide assistance with transfer out of bed and ambulation.    Nursing Pain Medication Assessment:  Safety precautions to be maintained throughout the outpatient stay will include: orient to surroundings, keep bed in low position, maintain call bell within reach at all times, provide assistance with transfer out of bed and ambulation.  Medication Inspection Compliance: Gregory Harrison did not comply with our request to bring his pills to be counted. He was reminded that bringing the medication bottles, even when empty, is a requirement.  Medication: Hydrocodone/APAP Pill/Patch Count: No pills available to be counted. Pill/Patch Appearance: No markings Bottle Appearance: No container available. Did not bring bottle(s) to appointment. Filled Date: 0 / 0 / 2025 Last Medication intake:  Yesterday  Pt states he forgot his medication bottle; instructed to bring to next visit and pt verb u/o

## 2024-02-12 NOTE — Progress Notes (Signed)
 PROVIDER NOTE: Information contained herein reflects review and annotations entered in association with encounter. Interpretation of such information and data should be left to medically-trained personnel. Information provided to patient can be located elsewhere in the medical record under "Patient Instructions". Document created using STT-dictation technology, any transcriptional errors that may result from process are unintentional.    Patient: Gregory Harrison  Service Category: E/M  Provider: Edward Jolly, MD  DOB: 10/01/1951  DOS: 02/12/2024  Referring Provider: Darral Dash, DO  MRN: [id number]  Specialty: Interventional Pain Management  PCP: Darral Dash, DO  Type: Established Patient  Setting: Ambulatory outpatient    Location: Office  Delivery: Face-to-face     HPI  Gregory Harrison, a 73 y.o. year old male, is here today because of his Chronic pain syndrome [G89.4]. Mr. Trejos primary complain today is Shoulder Pain (Left shoulder, both knees)  Pertinent problems: Mr. List has Type 2 diabetes mellitus with hyperglycemia (HCC); Carpal tunnel syndrome of right wrist; Lumbar facet arthropathy; Lumbar degenerative disc disease; Bilateral primary osteoarthritis of knee; and Primary osteoarthritis of left shoulder on their pertinent problem list. Pain Assessment: Severity of Chronic pain is reported as a 9 /10. Location: Shoulder Left/denies. Onset: More than a month ago. Quality: Aching, Burning, Constant. Timing: Constant. Modifying factor(s): meds. Vitals:  height is 6\' 2"  (1.88 m) and weight is 180 lb (81.6 kg). His temperature is 97.2 F (36.2 C) (abnormal). His blood pressure is 162/73 (abnormal) and his pulse is 62. His oxygen saturation is 100%.  BMI: Estimated body mass index is 23.11 kg/m as calculated from the following:   Height as of this encounter: 6\' 2"  (1.88 m).   Weight as of this encounter: 180 lb (81.6 kg). Last encounter: 08/19/2023. Last procedure: Visit date not  found.  Reason for encounter: medication management.   No change in medical history since last visit.  Patient's pain is at baseline.  Patient continues multimodal pain regimen as prescribed.  States that it provides pain relief and improvement in functional status. Persistent left shoulder pain due to shoulder OA  Analgesic: Hydrocodone 5 mg twice daily as needed, quantity 60/month MME equals 10    Monitoring: [place] PMP: PDMP reviewed during this encounter.       Pharmacotherapy: No side-effects or adverse reactions reported. Compliance: No problems identified. Effectiveness: Clinically acceptable.  Florina Ou, RN  02/12/2024  8:54 AM  Sign when Signing Visit Safety precautions to be maintained throughout the outpatient stay will include: orient to surroundings, keep bed in low position, maintain call bell within reach at all times, provide assistance with transfer out of bed and ambulation.    Nursing Pain Medication Assessment:  Safety precautions to be maintained throughout the outpatient stay will include: orient to surroundings, keep bed in low position, maintain call bell within reach at all times, provide assistance with transfer out of bed and ambulation.  Medication Inspection Compliance: Mr. Deboy did not comply with our request to bring his pills to be counted. He was reminded that bringing the medication bottles, even when empty, is a requirement.  Medication: Hydrocodone/APAP Pill/Patch Count: No pills available to be counted. Pill/Patch Appearance: No markings Bottle Appearance: No container available. Did not bring bottle(s) to appointment. Filled Date: 0 / 0 / 2025 Last Medication intake:  Yesterday  Pt states he forgot his medication bottle; instructed to bring to next visit and pt verb u/o  No results found for: "CBDTHCR" No results found for: "D8THCCBX" No results  found for: "D9THCCBX"  UDS:  Summary  Date Value Ref Range Status  11/18/2023 FINAL  Final     Comment:    ==================================================================== ToxASSURE Select 13 (MW) ==================================================================== Specimen Alert ToxAssure, ToxAssure FLEX or MAT drug testing: -Technical component- Result certification performed at [employer], [address], [place], [place] ==================================================================== Test                             Result       Flag       Units  Drug Present and Declared for Prescription Verification   Hydrocodone                    1710         EXPECTED   ng/mg creat   Hydromorphone                  290          EXPECTED   ng/mg creat   Dihydrocodeine                 138          EXPECTED   ng/mg creat   Norhydrocodone                 926          EXPECTED   ng/mg creat    Sources of hydrocodone include scheduled prescription medications.    Hydromorphone, dihydrocodeine and norhydrocodone are expected    metabolites of hydrocodone. Hydromorphone and dihydrocodeine are    also available as scheduled prescription medications.  Drug Absent but Declared for Prescription Verification   Alcohol, Ethyl                 Not Detected UNEXPECTED g/dL ==================================================================== Test                      Result    Flag   Units      Ref Range   Creatinine              39               mg/dL      >=16 ==================================================================== Declared Medications:  The flagging and interpretation on this report are based on the  following declared medications.  Unexpected results may arise from  inaccuracies in the declared medications.   **Note: The testing scope of this panel includes these medications:   Alcohol, Ethyl  Hydrocodone (Norco)   **Note: The testing scope of this panel does not include the  following reported medications:   Acetaminophen (Norco)  Amlodipine (Norvasc)   Atorvastatin (Lipitor)  Empagliflozin (Jardiance)  Gabapentin (Neurontin)  Hydrochlorothiazide  Insulin (Basaglar)  Metformin (Glucophage)  Topical  Valsartan (Diovan) ==================================================================== For clinical consultation, please call [phone]. ====================================================================       ROS  Constitutional: Denies any fever or chills Gastrointestinal: No reported hemesis, hematochezia, vomiting, or acute GI distress Musculoskeletal:  as above Neurological: No reported episodes of acute onset apraxia, aphasia, dysarthria, agnosia, amnesia, paralysis, loss of coordination, or loss of consciousness  Medication Review  Alcohol Swabs, Basaglar KwikPen, Emollient, HYDROcodone-acetaminophen, Insulin Pen Needle, ONE TOUCH DELICA LANCING DEV, OneTouch Delica Lancets Fine, OneTouch Verio, amLODipine, atorvastatin, blood glucose meter kit and supplies, empagliflozin, gabapentin, glucose blood, insulin lispro, metFORMIN, and valsartan-hydrochlorothiazide  History Review  Allergy: Mr. Mciver has no known  allergies. Drug: Mr. Cali  reports that he does not currently use drugs after having used the following drugs: Marijuana. Alcohol:  reports that he does not currently use alcohol. Tobacco:  reports that he quit smoking about 36 years ago. His smoking use included cigarettes. He started smoking about 56 years ago. He has a 40 pack-year smoking history. He has never used smokeless tobacco. Social: Mr. Consalvo  reports that he quit smoking about 36 years ago. His smoking use included cigarettes. He started smoking about 56 years ago. He has a 40 pack-year smoking history. He has never used smokeless tobacco. He reports that he does not currently use alcohol. He reports that he does not currently use drugs after having used the following drugs: Marijuana. Medical:  has a past medical history of Arthritis, Back skin lesion  (03/12/2022), Biceps tendinitis, left (05/19/2023), Cancer (HCC) (2023), Chronic left shoulder pain (02/26/2018), Diabetes (HCC) (2000), Diabetic neuropathy (HCC), GERD (gastroesophageal reflux disease), Hand lesion (05/09/2022), HTN (hypertension) (2010), Hypercholesterolemia, Impingement of right shoulder (07/30/2022), Pain in joint, shoulder region (03/24/2019), Post-viral cough syndrome (10/20/2020), Rotator cuff impingement syndrome, left (05/19/2023), Solitary pulmonary nodule on lung CT (08/19/2022), Subacute cough (08/09/2022), Trigger finger of right thumb (06/04/2018), Trigger finger, right little finger (01/15/2020), Vision problem, and Weight loss (07/16/2019). Surgical: Mr. Jarrard  has a past surgical history that includes Hand surgery (1980s); Nose surgery (1979); Eye surgery (Left, 1990s); Tonsillectomy; and Cholecystectomy (N/A, 10/16/2022). Family: family history includes ALS in his brother; Diabetes in his paternal grandfather; Heart attack in his father; Heart murmur in his father; Other in his mother.  Laboratory Chemistry Profile   Renal Lab Results  Component Value Date   BUN 15 08/07/2023   CREATININE 0.80 08/07/2023   BCR 19 08/07/2023   GFRAA 88 10/19/2020   GFRNONAA >60 10/08/2022    Hepatic Lab Results  Component Value Date   AST 12 08/07/2023   ALT 15 08/07/2023   ALBUMIN 4.2 08/07/2023   ALKPHOS 72 08/07/2023    Electrolytes Lab Results  Component Value Date   NA 139 08/07/2023   K 4.0 08/07/2023   CL 100 08/07/2023   CALCIUM 10.0 08/07/2023    Bone No results found for: "VD25OH", "VD125OH2TOT", "ZO1096EA5", "WU9811BJ4", "25OHVITD1", "25OHVITD2", "25OHVITD3", "TESTOFREE", "TESTOSTERONE"  Inflammation (CRP: Acute Phase) (ESR: Chronic Phase) Lab Results  Component Value Date   ESRSEDRATE 20 08/08/2022         Note: Above Lab results reviewed.  Recent Imaging Review  DG Foot Complete Left CLINICAL DATA:  Injury to left foot with bruising and erythema  of left second toe.  EXAM: LEFT FOOT - COMPLETE 3+ VIEW  COMPARISON:  None Available.  FINDINGS: Nondisplaced transverse sclerosis through the midportion of the second middle phalanx likely reflects a healing fracture. No bony destruction to suggest osteomyelitis. No bony lesions. No significant arthropathy. Soft tissues are unremarkable.  IMPRESSION: Nondisplaced transverse sclerosis through the midportion of the second middle phalanx likely reflects a healing fracture.  Electronically Signed   By: Irish Lack M.D.   On: 11/19/2023 15:21 Note: Reviewed        Physical Exam  General appearance: Well nourished, well developed, and well hydrated. In no apparent acute distress Mental status: Alert, oriented x 3 (person, place, & time)       Respiratory: No evidence of acute respiratory distress Eyes: PERLA Vitals: BP (!) 162/73   Pulse 62   Temp (!) 97.2 F (36.2 C)   Ht 6\' 2"  (1.88 m)  Wt 180 lb (81.6 kg)   SpO2 100%   BMI 23.11 kg/m  BMI: Estimated body mass index is 23.11 kg/m as calculated from the following:   Height as of this encounter: 6\' 2"  (1.88 m).   Weight as of this encounter: 180 lb (81.6 kg). Ideal: Ideal body weight: 82.2 kg (181 lb 3.5 oz)  Left shoulder pain, limited ROM  Assessment   Diagnosis Status  1. Chronic pain syndrome   2. Chronic left shoulder pain   3. Primary osteoarthritis of left shoulder   4. Diabetic polyneuropathy associated with type 2 diabetes mellitus (HCC)   5. Type 2 diabetes mellitus with diabetic polyneuropathy, without long-term current use of insulin (HCC)   6. Lumbar facet arthropathy    Controlled Persistent Persistent   Updated Problems: No problems updated.  Plan of Care  Recommend pt reach out to Dr Ashley Royalty for US guided shoulder injection, medication refill as below UDS up to date  Mr. JASH WAHLEN has a current medication list which includes the following long-term medication(s): amlodipine,  atorvastatin, gabapentin, basaglar kwikpen, insulin lispro, metformin, metformin, and valsartan-hydrochlorothiazide.  Pharmacotherapy (Medications Ordered): Meds ordered this encounter  Medications   HYDROcodone-acetaminophen (NORCO/VICODIN) 5-325 MG tablet    Sig: Take 1 tablet by mouth every 12 (twelve) hours as needed for severe pain (pain score 7-10). Must last 30 days.    Dispense:  60 tablet    Refill:  0    Chronic Pain: STOP Act (Not applicable) Fill 1 day early if closed on refill date. Avoid benzodiazepines within 8 hours of opioids   HYDROcodone-acetaminophen (NORCO/VICODIN) 5-325 MG tablet    Sig: Take 1 tablet by mouth every 12 (twelve) hours as needed for severe pain (pain score 7-10). Must last 30 days.    Dispense:  60 tablet    Refill:  0    Chronic Pain: STOP Act (Not applicable) Fill 1 day early if closed on refill date. Avoid benzodiazepines within 8 hours of opioids   HYDROcodone-acetaminophen (NORCO/VICODIN) 5-325 MG tablet    Sig: Take 1 tablet by mouth every 12 (twelve) hours as needed for severe pain (pain score 7-10). Must last 30 days.    Dispense:  60 tablet    Refill:  0    Chronic Pain: STOP Act (Not applicable) Fill 1 day early if closed on refill date. Avoid benzodiazepines within 8 hours of opioids   Orders:  No orders of the defined types were placed in this encounter.  Follow-up plan:   Return in about 3 months (around 05/14/2024) for MM, F2F.     Recent Visits Date Type Provider Dept  11/18/23 Office Visit Edward Jolly, MD [facility]  Showing recent visits within past 90 days and meeting all other requirements Today's Visits Date Type Provider Dept  02/12/24 Office Visit Edward Jolly, MD [facility]  Showing today's visits and meeting all other requirements Future Appointments Date Type Provider Dept  05/06/24 Appointment Edward Jolly, MD [facility]  Showing future appointments within next 90 days and meeting  all other requirements  I discussed the assessment and treatment plan with the patient. The patient was provided an opportunity to ask questions and all were answered. The patient agreed with the plan and demonstrated an understanding of the instructions.  Patient advised to call back or seek an in-person evaluation if the symptoms or condition worsens.  Duration of encounter: .  Total time on encounter, as per AMA guidelines included both  the face-to-face and non-face-to-face time personally spent by the physician and/or other qualified health care professional(s) on the day of the encounter (includes time in activities that require the physician or other qualified health care professional and does not include time in activities normally performed by clinical staff). Physician's time may include the following activities when performed: Preparing to see the patient (e.g., pre-charting review of records, searching for previously ordered imaging, lab work, and nerve conduction tests) Review of prior analgesic pharmacotherapies. Reviewing PMP Interpreting ordered tests (e.g., lab work, imaging, nerve conduction tests) Performing post-procedure evaluations, including interpretation of diagnostic procedures Obtaining and/or reviewing separately obtained history Performing a medically appropriate examination and/or evaluation Counseling and educating the patient/family/caregiver Ordering medications, tests, or procedures Referring and communicating with other health care professionals (when not separately reported) Documenting clinical information in the electronic or other health record Independently interpreting results (not separately reported) and communicating results to the patient/ family/caregiver Care coordination (not separately reported)  Note by: Edward Jolly, MD Date: 02/12/2024; Time: 9:31 AM

## 2024-04-07 ENCOUNTER — Ambulatory Visit (HOSPITAL_COMMUNITY)
Admission: RE | Admit: 2024-04-07 | Discharge: 2024-04-07 | Disposition: A | Discharge status: Home / Self Care | Source: Ambulatory Visit | Attending: Family Medicine | Admitting: Family Medicine

## 2024-04-07 ENCOUNTER — Encounter: Payer: Self-pay | Admitting: Student

## 2024-04-07 ENCOUNTER — Ambulatory Visit (INDEPENDENT_AMBULATORY_CARE_PROVIDER_SITE_OTHER): Admitting: Student

## 2024-04-07 VITALS — BP 117/75 | HR 68 | Ht 74.0 in | Wt 171.0 lb

## 2024-04-07 DIAGNOSIS — E1165 Type 2 diabetes mellitus with hyperglycemia: Secondary | ICD-10-CM | POA: Diagnosis not present

## 2024-04-07 DIAGNOSIS — R634 Abnormal weight loss: Secondary | ICD-10-CM

## 2024-04-07 DIAGNOSIS — R5383 Other fatigue: Secondary | ICD-10-CM

## 2024-04-07 DIAGNOSIS — R590 Localized enlarged lymph nodes: Secondary | ICD-10-CM

## 2024-04-07 DIAGNOSIS — R911 Solitary pulmonary nodule: Secondary | ICD-10-CM

## 2024-04-07 DIAGNOSIS — Z794 Long term (current) use of insulin: Secondary | ICD-10-CM | POA: Diagnosis not present

## 2024-04-07 LAB — POCT GLYCOSYLATED HEMOGLOBIN (HGB A1C): HbA1c, POC (controlled diabetic range): 10.7 % — AB (ref 0.0–7.0)

## 2024-04-07 LAB — POCT SEDIMENTATION RATE: POCT SED RATE: 37 mm/h — AB (ref 0–22)

## 2024-04-07 MED ORDER — BASAGLAR KWIKPEN 100 UNIT/ML ~~LOC~~ SOPN: 40.0000 [IU] | PEN_INJECTOR | Freq: Every day | SUBCUTANEOUS | Status: DC

## 2024-04-07 NOTE — Patient Instructions (Addendum)
 It was great seeing you today.  As we discussed, - Schedule a 40 minute visit at the front desk before leaving today to see me in 1 week - Blood work today - I sent your insulin  to the pharmacy - You will be called to schedule your CT scan   If you have any questions or concerns, please feel free to call the clinic.   Have a wonderful day,  Dr. Vallorie Gayer [facility] [phone]

## 2024-04-07 NOTE — Progress Notes (Signed)
    SUBJECTIVE:   CHIEF COMPLAINT / HPI:   Discussed the use of AI scribe software for clinical note transcription with the patient, who gave verbal consent to proceed.  History of Present Illness   Gregory Harrison is a 73 year old male with diabetes who presents with dizziness and weight loss.  He experiences dizziness for at least a month, with a sensation of water  in his ears and a hollow feeling. He feels unsteady when bending over and standing up, and almost fell while fishing.  He has lost ten pounds over the past month, attributing it to a loss of strength and muscle mass. He is concerned about his appearance, feeling he looks frail.  His blood sugar levels are high, with morning readings of 300 or more. He takes 40 units of insulin  glargine daily, but the insulin  is expired. He has not used short-acting insulin  in months. His A1c is 10.7, improved from 13.  He smoked for 20 years, quitting in 1989. He enjoys fishing and painting but struggles due to his symptoms.   Denies changes in stool. No hematuria, no melena or hematochezia. UTD on colonoscopy. No fever, night sweats.      OBJECTIVE:   BP 117/75   Pulse 68   Ht 6\' 2"  (1.88 m)   Wt 171 lb (77.6 kg)   SpO2 100%   BMI 21.96 kg/m   General: Pleasant, frail and thin-appearing HEENT: Right ear with cerumen impaction. Left ear canal clear, no cerumen. Temporal wasting noted bilaterally. NECK: Right cervical lymphadenopathy, 3cm and firm. CHEST: Clear to auscultation bilaterally. CARDIOVASCULAR: Normal heart rate and rhythm, no murmurs. ABDOMEN: Normal on palpation. NEUROLOGICAL:  Awake, alert, oriented to person, place, time with normal speech. Visual fields normal in all quadrants (hx cataracts). Extraocular movement intact without ptosis. Facial sensation intact. Facial muscle strength normal and intact bilaterally. Voice is normal. Shoulder shrug strong and equal bilaterally. Tongue protrudes midline and moves  symmetrically. Normal finger to nose test.       ASSESSMENT/PLAN:   Type 2 diabetes mellitus with hyperglycemia (HCC) Chronic type 2 diabetes with A1c of 10.7. Experiencing hyperglycemia with blood sugars 300-500. Unintentional weight loss contributing to frailty. Current insulin  regimen inadequate. - Continue Basaglar  (insulin  glargine) 40 units daily. - Prescribe Humalog  (insulin  lispro) for pre-meal use. - 1 week follow up and to see Dr. Koval as well for further assistance in management   Weight loss Unintentional 10-pound weight loss over a month, contributing to frailty. Concern for underlying causes beyond diabetes, including malignancy. Suspicious lesion present for 15 years on right chest, intermittently itchy and bleeding. Concern for malignancy. - Refer to dermatologist for evaluation and possible biopsy. - Order CT scan of the chest for malignancy evaluation (prior CT with 8 mm nodule in 2024) - Order blood work for other potential causes (ESR, CBC, CMP, Iron/Ferritin, LDH)  Close f/u in 1 week    Vallorie Gayer, DO [facility]

## 2024-04-08 LAB — BASIC METABOLIC PANEL WITH GFR
BUN/Creatinine Ratio: 21 (ref 10–24)
BUN: 27 mg/dL (ref 8–27)
CO2: 20 mmol/L (ref 20–29)
Calcium: 10.2 mg/dL (ref 8.6–10.2)
Chloride: 100 mmol/L (ref 96–106)
Creatinine, Ser: 1.31 mg/dL — ABNORMAL HIGH (ref 0.76–1.27)
Glucose: 329 mg/dL — ABNORMAL HIGH (ref 70–99)
Potassium: 5.6 mmol/L — ABNORMAL HIGH (ref 3.5–5.2)
Sodium: 137 mmol/L (ref 134–144)
eGFR: 57 mL/min/{1.73_m2} — ABNORMAL LOW (ref >59)

## 2024-04-08 LAB — IRON,TIBC AND FERRITIN PANEL
Ferritin: 76 ng/mL (ref 30–400)
Iron Saturation: 15 % (ref 15–55)
Iron: 49 ug/dL (ref 38–169)
Total Iron Binding Capacity: 322 ug/dL (ref 250–450)
UIBC: 273 ug/dL (ref 111–343)

## 2024-04-08 LAB — CBC WITH DIFFERENTIAL/PLATELET
Basophils Absolute: 0 10*3/uL (ref 0.0–0.2)
Basos: 1 %
EOS (ABSOLUTE): 0.1 10*3/uL (ref 0.0–0.4)
Eos: 2 %
Hematocrit: 44.8 % (ref 37.5–51.0)
Hemoglobin: 13.3 g/dL (ref 13.0–17.7)
Immature Grans (Abs): 0.1 10*3/uL (ref 0.0–0.1)
Immature Granulocytes: 1 %
Lymphocytes Absolute: 1.8 10*3/uL (ref 0.7–3.1)
Lymphs: 23 %
MCH: 26.4 pg — ABNORMAL LOW (ref 26.6–33.0)
MCHC: 29.7 g/dL — ABNORMAL LOW (ref 31.5–35.7)
MCV: 89 fL (ref 79–97)
Monocytes Absolute: 0.5 10*3/uL (ref 0.1–0.9)
Monocytes: 6 %
Neutrophils Absolute: 5.4 10*3/uL (ref 1.4–7.0)
Neutrophils: 67 %
Platelets: 325 10*3/uL (ref 150–450)
RBC: 5.04 x10E6/uL (ref 4.14–5.80)
RDW: 13.5 % (ref 11.6–15.4)
WBC: 7.9 10*3/uL (ref 3.4–10.8)

## 2024-04-08 LAB — PSA: Prostate Specific Ag, Serum: 1.2 ng/mL (ref 0.0–4.0)

## 2024-04-08 LAB — LIPID PANEL
Chol/HDL Ratio: 6.2 ratio — ABNORMAL HIGH (ref 0.0–5.0)
Cholesterol, Total: 187 mg/dL (ref 100–199)
HDL: 30 mg/dL — ABNORMAL LOW (ref >39)
LDL Chol Calc (NIH): 103 mg/dL — ABNORMAL HIGH (ref 0–99)
Triglycerides: 317 mg/dL — ABNORMAL HIGH (ref 0–149)
VLDL Cholesterol Cal: 54 mg/dL — ABNORMAL HIGH (ref 5–40)

## 2024-04-08 LAB — LACTATE DEHYDROGENASE: LDH: 142 IU/L (ref 121–224)

## 2024-04-08 LAB — C-REACTIVE PROTEIN: CRP: 8 mg/L (ref 0–10)

## 2024-04-08 NOTE — Assessment & Plan Note (Signed)
 Chronic type 2 diabetes with A1c of 10.7. Experiencing hyperglycemia with blood sugars 300-500. Unintentional weight loss contributing to frailty. Current insulin  regimen inadequate. - Continue Basaglar  (insulin  glargine) 40 units daily. - Prescribe Humalog  (insulin  lispro) for pre-meal use. - 1 week follow up and to see Dr. Koval as well for further assistance in management

## 2024-04-08 NOTE — Assessment & Plan Note (Addendum)
 Unintentional 10-pound weight loss over a month, contributing to frailty. Concern for underlying causes beyond diabetes, including malignancy. Suspicious lesion present for 15 years on right chest, intermittently itchy and bleeding. Concern for malignancy. - Refer to dermatologist for evaluation and possible biopsy. - Order CT scan of the chest for malignancy evaluation (prior CT with 8 mm nodule in 2024) - Order blood work for other potential causes (ESR, CBC, CMP, Iron/Ferritin, LDH) - Ultrasound for enlarged cervical lymph node

## 2024-04-14 ENCOUNTER — Ambulatory Visit (HOSPITAL_COMMUNITY)
Admission: RE | Admit: 2024-04-14 | Discharge: 2024-04-14 | Disposition: A | Discharge status: Home / Self Care | Source: Ambulatory Visit | Attending: Family Medicine | Admitting: Family Medicine

## 2024-04-14 DIAGNOSIS — R911 Solitary pulmonary nodule: Secondary | ICD-10-CM | POA: Diagnosis present

## 2024-04-18 ENCOUNTER — Other Ambulatory Visit: Payer: Self-pay | Admitting: Student

## 2024-04-20 ENCOUNTER — Ambulatory Visit (INDEPENDENT_AMBULATORY_CARE_PROVIDER_SITE_OTHER): Admitting: Family Medicine

## 2024-04-20 ENCOUNTER — Other Ambulatory Visit (HOSPITAL_COMMUNITY): Payer: Self-pay

## 2024-04-20 ENCOUNTER — Encounter: Payer: Self-pay | Admitting: Family Medicine

## 2024-04-20 VITALS — BP 131/68 | HR 66 | Ht 74.0 in | Wt 171.2 lb

## 2024-04-20 DIAGNOSIS — E875 Hyperkalemia: Secondary | ICD-10-CM

## 2024-04-20 DIAGNOSIS — Z794 Long term (current) use of insulin: Secondary | ICD-10-CM | POA: Diagnosis not present

## 2024-04-20 DIAGNOSIS — I152 Hypertension secondary to endocrine disorders: Secondary | ICD-10-CM

## 2024-04-20 DIAGNOSIS — E7800 Pure hypercholesterolemia, unspecified: Secondary | ICD-10-CM | POA: Diagnosis not present

## 2024-04-20 DIAGNOSIS — E1165 Type 2 diabetes mellitus with hyperglycemia: Secondary | ICD-10-CM

## 2024-04-20 DIAGNOSIS — E1159 Type 2 diabetes mellitus with other circulatory complications: Secondary | ICD-10-CM | POA: Diagnosis not present

## 2024-04-20 MED ORDER — BASAGLAR KWIKPEN 100 UNIT/ML ~~LOC~~ SOPN: 40.0000 [IU] | PEN_INJECTOR | Freq: Every day | SUBCUTANEOUS | 1 refills | Status: DC

## 2024-04-20 MED ORDER — INSULIN GLARGINE 100 UNIT/ML SOLOSTAR PEN
40.0000 [IU] | PEN_INJECTOR | Freq: Every day | SUBCUTANEOUS | 1 refills | Status: DC
Start: 2024-04-20 — End: 2024-05-12

## 2024-04-20 MED ORDER — ATORVASTATIN CALCIUM 40 MG PO TABS: 40.0000 mg | ORAL_TABLET | Freq: Every day | ORAL | 3 refills | Status: AC

## 2024-04-20 MED ORDER — VALSARTAN-HYDROCHLOROTHIAZIDE 320-25 MG PO TABS: 1.0000 | ORAL_TABLET | Freq: Every day | ORAL | 3 refills | Status: DC

## 2024-04-20 NOTE — Patient Instructions (Addendum)
 Please take 40 units of insulin  (Lantus ) daily  Please keep track of your blood sugar daily - write down your fasting blood sugar every day  See Dr. Koval on 04/27/2024

## 2024-04-20 NOTE — Progress Notes (Signed)
    SUBJECTIVE:   CHIEF COMPLAINT / HPI:   Seen 4/30 for diabetes Had hyperglycemia at that time, blood sugars 300-500 Was on basaglar  40u daily Was started on lispro with meals   Today reports has not been able to take insulin  recently due to cost - took it briefly but ran out and refill is $400+. Most recently took it about 1.5 weeks ago Also reports he has not been taking metformin  regularly due to it making him dizzy Still taking Jardiance  Home CBG today 269 - no insulin   Denies CP, SOB, N/V, abd pain   Also had hyperkalemia on recent BMP Taking valsartan -hydrochlorothiazide  320-25  HLD noted on lipid panel States he thought he was supposed to stop taking lipitor but is ok with restarting. Needs refill  PERTINENT  PMH / PSH: HTN, diabetes, HLD  OBJECTIVE:   BP 131/68   Pulse 66   Ht 6\' 2"  (1.88 m)   Wt 171 lb 4 oz (77.7 kg)   SpO2 99%   BMI 21.99 kg/m    General: NAD, pleasant, able to participate in exam Respiratory: No respiratory distress. CTAB on RA Abd:  Soft NTND Skin: warm and dry, no rashes noted Psych: Normal affect and mood  ASSESSMENT/PLAN:   Assessment & Plan Type 2 diabetes mellitus with hyperglycemia, with long-term current use of insulin  [facility]) Discussed with pharmacy team Dr. Koval and Aron Lard, appreciate their help Switched Basaglar  to Lantus  for insurance coverage.  This should reduce his cost to $35 a month. For now, continue 40 units of Lantus  daily with regular CBG monitoring Follow-up scheduled with Dr. Koval in 1 week, consider titrating up to 50 units daily Hold off on mealtime insulin  for now, consider restarting next week Continue Jardiance  Continue metformin  although patient may not take this due to reported side effects Discussed return precautions and symptoms of hypoglycemia Hyperkalemia Noted on recent BMP Recheck BMP today If still elevated, plan to give Lokelma and consider decreasing dose of valsartan  on his combo pill vs  holding medication until follow up with Dr. Alice Innocent Hypercholesterolemia Refill Lipitor 40 mg daily   Edison Gore, MD [facility]

## 2024-04-20 NOTE — Assessment & Plan Note (Signed)
 Discussed with pharmacy team Dr. Koval and Aron Lard, appreciate their help Switched Basaglar  to Lantus  for insurance coverage.  This should reduce his cost to $35 a month. For now, continue 40 units of Lantus  daily with regular CBG monitoring Follow-up scheduled with Dr. Koval in 1 week, consider titrating up to 50 units daily Hold off on mealtime insulin  for now, consider restarting next week Continue Jardiance  Continue metformin  although patient may not take this due to reported side effects Discussed return precautions and symptoms of hypoglycemia

## 2024-04-21 ENCOUNTER — Ambulatory Visit: Payer: Self-pay | Admitting: Family Medicine

## 2024-04-21 DIAGNOSIS — E875 Hyperkalemia: Secondary | ICD-10-CM

## 2024-04-21 DIAGNOSIS — E1159 Type 2 diabetes mellitus with other circulatory complications: Secondary | ICD-10-CM

## 2024-04-21 LAB — BASIC METABOLIC PANEL WITH GFR
BUN/Creatinine Ratio: 17 (ref 10–24)
BUN: 22 mg/dL (ref 8–27)
CO2: 22 mmol/L (ref 20–29)
Calcium: 9.9 mg/dL (ref 8.6–10.2)
Chloride: 97 mmol/L (ref 96–106)
Creatinine, Ser: 1.28 mg/dL — ABNORMAL HIGH (ref 0.76–1.27)
Glucose: 350 mg/dL — ABNORMAL HIGH (ref 70–99)
Potassium: 5.3 mmol/L — ABNORMAL HIGH (ref 3.5–5.2)
Sodium: 135 mmol/L (ref 134–144)
eGFR: 59 mL/min/{1.73_m2} — ABNORMAL LOW (ref >59)

## 2024-04-21 MED ORDER — LOKELMA 10 G PO PACK: 10.0000 g | PACK | Freq: Once | ORAL | 0 refills | Status: AC

## 2024-04-21 MED ORDER — VALSARTAN-HYDROCHLOROTHIAZIDE 160-25 MG PO TABS: 1.0000 | ORAL_TABLET | Freq: Every day | ORAL | 1 refills | Status: DC

## 2024-04-21 NOTE — Telephone Encounter (Addendum)
 Attempted to call x2 regarding persistent hyperkalemia on BMP. Left voicemail. Reassuringly it is slightly improved from prior though still elevated.  I suspect this is related to his valsartan  (he is currently on valsartan -hydrochlorothiazide  combo 320mg -25mg ).   Plan is to treat the hyperkalemia with 1 dose of lokelma.  Additionally, decrease dose of valsartan -hydrochlorothiazide  to 160mg -25mg .   Recheck BMP when he is here 5/20 for his visit with Dr. Koval.  I have discontinued his current valsartan -hydrochlorothiazide  and ordered the new dose. I have also ordered 1 dose of lokelma as well as a future order for BMP to be done 5/20.  I spoke with his pharmacy to confirm that patient has not picked up his previous dose of valsartan -hydrochlorothiazide  and updated pharmacist on the plan if he comes to the pharmacy.  CC Dr. Koval to send pt to lab after visit on 5/20. Scheduled lab appt for 11:30 right after pharmacy visit.  If patient calls back, please inform him of the above. Happy to discuss further if he would like.  Edison Gore, MD 9:24 AM 04/21/24

## 2024-04-27 ENCOUNTER — Ambulatory Visit (INDEPENDENT_AMBULATORY_CARE_PROVIDER_SITE_OTHER): Admitting: Pharmacist

## 2024-04-27 ENCOUNTER — Encounter: Payer: Self-pay | Admitting: Pharmacist

## 2024-04-27 ENCOUNTER — Ambulatory Visit: Payer: Self-pay | Admitting: Student

## 2024-04-27 ENCOUNTER — Other Ambulatory Visit: Payer: Self-pay

## 2024-04-27 VITALS — BP 155/64 | HR 63

## 2024-04-27 DIAGNOSIS — E1159 Type 2 diabetes mellitus with other circulatory complications: Secondary | ICD-10-CM | POA: Diagnosis not present

## 2024-04-27 DIAGNOSIS — E875 Hyperkalemia: Secondary | ICD-10-CM

## 2024-04-27 DIAGNOSIS — E1165 Type 2 diabetes mellitus with hyperglycemia: Secondary | ICD-10-CM | POA: Diagnosis not present

## 2024-04-27 DIAGNOSIS — Z794 Long term (current) use of insulin: Secondary | ICD-10-CM

## 2024-04-27 DIAGNOSIS — E118 Type 2 diabetes mellitus with unspecified complications: Secondary | ICD-10-CM

## 2024-04-27 DIAGNOSIS — I152 Hypertension secondary to endocrine disorders: Secondary | ICD-10-CM

## 2024-04-27 MED ORDER — AMLODIPINE BESYLATE 10 MG PO TABS
10.0000 mg | ORAL_TABLET | Freq: Every day | ORAL | 3 refills | Status: AC
Start: 2024-04-27 — End: ?

## 2024-04-27 MED ORDER — ONETOUCH VERIO VI STRP: ORAL_STRIP | 12 refills | Status: AC

## 2024-04-27 MED ORDER — INSULIN LISPRO (1 UNIT DIAL) 100 UNIT/ML (KWIKPEN): 14.0000 [IU] | PEN_INJECTOR | Freq: Three times a day (TID) | SUBCUTANEOUS | 11 refills | Status: DC

## 2024-04-27 MED ORDER — METFORMIN HCL ER 500 MG PO TB24: 1000.0000 mg | ORAL_TABLET | Freq: Two times a day (BID) | ORAL | 3 refills | Status: AC

## 2024-04-27 NOTE — Assessment & Plan Note (Signed)
 Hypertension longstanding since 2010 currently uncontrolled. Blood pressure goal of <130/80 mmHg. Was hyperkalemic at 5.3 mEq/L at last visit (5/13). Medication adherence poor due to inability to refill amlodipine  and misunderstanding on valsartan /hydrochlorothiazide  dose. Re-start amlodipine  10 mg with new Rx sent to pharmacy. Decrease dose of valsartan /hydrochlorothiazide  to 160/25 mg daily due to hyperkalemia. BMP ordered to assess electrolytes per Dr. Baby Bolt.

## 2024-04-27 NOTE — Assessment & Plan Note (Signed)
 Diabetes longstanding since 2000 currently uncontrolled per last A1c of 10.7%. Recent improvement due to adherence to insulin . Patient is able to verbalize appropriate hypoglycemia management plan. Medication adherence appears improved. Control is suboptimal due to inadequate insulin  dosing.  - Re-initiate Humalog  (insulin  lispro) with instruction to take 14 units before supper (or largest meal of the day).  Plan to increase to additional meals at future visits or follow-up contacts.  - Instructed patient to continue Lantus  (insulin  glargine) 40 units daily and metformin .

## 2024-04-27 NOTE — Progress Notes (Signed)
 S:     Chief Complaint  Patient presents with   Medication Management    Diabetes follow-up   73 y.o. male who presents for diabetes evaluation, education, and management. Patient arrives in good spirits and presents without any assistance.   Patient was referred and last seen by Primary Care Provider, Dr. Baby Bolt, on 04/20/24. At last visit, patient's potassium was elevated (5.3 mEq/L), and was given Lokelma . Valsartan  dose was also decreased to 160 mg    PMH is significant for HTN, T2DM, HLD.  Patient reports Diabetes was diagnosed in 2000.   Current diabetes medications include: Lantus  (insulin  glargine) 40 units daily, metformin  1,000 mg BID, Jardiance  (empagliflozin ) 25 mg daily.  Current hypertension medications include: amlodipine  10 mg, valsartan /hydrochlorothiazide  160/25 mg daily Current hyperlipidemia medications include: atorvastatin  40 mg daily.   Patient reports inconsistent adherence to medications. Has been out of amlodipine  and metformin  for multiple days and voiced frustration getting new prescriptions for them at the pharmacy. Was also non-adherent to insulin  due to expiration of Basaglar  from [employer]. Additionally, patient misunderstood instructions on valsartan /hydrochlorothiazide  160/25 mg and was taking two tablets daily (total dose of 320 mg valsartan  and 50 mg hydrochlorothiazide ).   Do you feel that your medications are working for you? yes Have you been experiencing any side effects to the medications prescribed? no Insurance coverage: Medicare   Patient denies hypoglycemic events or nocturia.  Reported home fasting blood sugars: Ranging from 144 - 198 mg/dL.  Reported post-meal blood sugars: ~335 about 30 minutes after eating this morning. Reports numbers have improved since re-starting insulin .   Patient reports neuropathy (nerve pain), currently managed with gabapentin  at bedtime. Reports symptoms only bother him before bed.   Patient-reported  exercise habits: Likes to spend significant time outdoors and will fish for hours at a time.   O:   Review of Systems  HENT:  Positive for hearing loss.   All other systems reviewed and are negative.   Physical Exam Vitals reviewed.  Constitutional:      Appearance: Normal appearance. He is normal weight.  Neurological:     Mental Status: He is alert.  Psychiatric:        Mood and Affect: Mood normal.        Behavior: Behavior normal.        Thought Content: Thought content normal.        Judgment: Judgment normal.     Lab Results  Component Value Date   HGBA1C 10.7 (A) 04/07/2024   Vitals:   04/27/24 1106 04/27/24 1109  BP: (!) 153/62 (!) 155/64  Pulse: 64 63  SpO2: 100%     Lipid Panel     Component Value Date/Time   CHOL 187 04/07/2024 0952   TRIG 317 (H) 04/07/2024 0952   HDL 30 (L) 04/07/2024 0952   CHOLHDL 6.2 (H) 04/07/2024 0952   LDLCALC 103 (H) 04/07/2024 0952   LDLDIRECT 134 (H) 02/24/2018 1613    Clinical Atherosclerotic Cardiovascular Disease (ASCVD): No  The 10-year ASCVD risk score (Arnett DK, et al., 2019) is: 63.3%   Values used to calculate the score:     Age: 76 years     Sex: Male     Is Non-Hispanic African American: No     Diabetic: Yes     Tobacco smoker: No     Systolic Blood Pressure: 155 mmHg     Is BP treated: Yes     HDL Cholesterol: 30 mg/dL  Total Cholesterol: 187 mg/dL   A/P: Diabetes longstanding since 2000 currently uncontrolled per last A1c of 10.7%. Recent improvement due to adherence to insulin . Patient is able to verbalize appropriate hypoglycemia management plan. Medication adherence appears improved. Control is suboptimal due to inadequate insulin  dosing.  - Re-initiate Humalog  (insulin  lispro) with instruction to take 14 units before supper (or largest meal of the day).  Plan to increase to additional meals at future visits or follow-up contacts.  - Instructed patient to continue Lantus  (insulin  glargine) 40 units  daily and metformin .  Next A1c anticipated in August.   ASCVD risk - primary prevention in patient with diabetes. Last LDL of 103 mg/dL is not at goal of <69 mg/dL. ASCVD risk factors include T2DM, uncontrolled HTN, HLD, and 10-year ASCVD risk score of 63.3%. high intensity statin indicated. Continue atorvastatin  40 mg daily.    Hypertension longstanding since 2010 currently uncontrolled. Blood pressure goal of <130/80 mmHg. Was hyperkalemic at 5.3 mEq/L at last visit (5/13). Medication adherence poor due to inability to refill amlodipine  and misunderstanding on valsartan /hydrochlorothiazide  dose. Re-start amlodipine  10 mg with new Rx sent to pharmacy. Decrease dose of valsartan /hydrochlorothiazide  to 160/25 mg daily due to hyperkalemia. BMP ordered to assess electrolytes per Dr. Baby Bolt.   Written patient instructions provided. Patient verbalized understanding of treatment plan. Total time in face to face counseling 38 minutes.    Follow-up:  Pharmacist on 6/4 PCP clinic visit on 6/17.  Patient seen with Noah [place], PharmD Candidate and Volney Grumbles, PharmD, PGY-1 pharmacy resident.

## 2024-04-27 NOTE — Patient Instructions (Addendum)
 It was nice to see you today!  Your goal blood sugar is 80-130 before eating and less than 180 after eating.  Medication Changes: START taking Humalog  (insulin  lispro) 14 units once daily with supper or your largest meal of the day.  START taking valsartan /hydrochlorothiazide  160/25 mg ONE tablet daily.   Continue taking Lantus  40 units daily.   We have sent refills for your test strips, amlodipine , and metformin  to your pharmacy.   Continue all other medication the same.  Monitor blood sugars at home and keep a log (glucometer or piece of paper) to bring with you to your next visit.  Keep up the good work with diet and exercise. Aim for a diet full of vegetables, fruit and lean meats (chicken, [place], fish). Try to limit salt intake by eating fresh or frozen vegetables (instead of canned), rinse canned vegetables prior to cooking and do not add any additional salt to meals.

## 2024-04-28 ENCOUNTER — Ambulatory Visit: Payer: Self-pay | Admitting: Student

## 2024-04-28 LAB — BASIC METABOLIC PANEL WITH GFR
BUN/Creatinine Ratio: 19 (ref 10–24)
BUN: 23 mg/dL (ref 8–27)
CO2: 22 mmol/L (ref 20–29)
Calcium: 9.8 mg/dL (ref 8.6–10.2)
Chloride: 98 mmol/L (ref 96–106)
Creatinine, Ser: 1.2 mg/dL (ref 0.76–1.27)
Glucose: 311 mg/dL — ABNORMAL HIGH (ref 70–99)
Potassium: 5 mmol/L (ref 3.5–5.2)
Sodium: 136 mmol/L (ref 134–144)
eGFR: 63 mL/min/{1.73_m2} (ref >59)

## 2024-04-28 NOTE — Progress Notes (Signed)
 Reviewed and agree with Dr Macky Lower plan.

## 2024-05-04 ENCOUNTER — Ambulatory Visit: Attending: Nurse Practitioner | Admitting: Nurse Practitioner

## 2024-05-04 ENCOUNTER — Encounter: Payer: Self-pay | Admitting: Nurse Practitioner

## 2024-05-04 VITALS — BP 143/73 | HR 62 | Temp 97.6°F | Resp 16 | Ht 74.0 in | Wt 172.0 lb

## 2024-05-04 DIAGNOSIS — M25512 Pain in left shoulder: Secondary | ICD-10-CM | POA: Insufficient documentation

## 2024-05-04 DIAGNOSIS — E1142 Type 2 diabetes mellitus with diabetic polyneuropathy: Secondary | ICD-10-CM | POA: Diagnosis not present

## 2024-05-04 DIAGNOSIS — Z794 Long term (current) use of insulin: Secondary | ICD-10-CM

## 2024-05-04 DIAGNOSIS — M47816 Spondylosis without myelopathy or radiculopathy, lumbar region: Secondary | ICD-10-CM

## 2024-05-04 DIAGNOSIS — G894 Chronic pain syndrome: Secondary | ICD-10-CM

## 2024-05-04 DIAGNOSIS — Z79899 Other long term (current) drug therapy: Secondary | ICD-10-CM

## 2024-05-04 DIAGNOSIS — G8929 Other chronic pain: Secondary | ICD-10-CM

## 2024-05-04 DIAGNOSIS — M19012 Primary osteoarthritis, left shoulder: Secondary | ICD-10-CM

## 2024-05-04 MED ORDER — HYDROCODONE-ACETAMINOPHEN 5-325 MG PO TABS: 1.0000 | ORAL_TABLET | Freq: Two times a day (BID) | ORAL | 0 refills | Status: DC | PRN

## 2024-05-04 MED ORDER — HYDROCODONE-ACETAMINOPHEN 5-325 MG PO TABS: 1.0000 | ORAL_TABLET | Freq: Two times a day (BID) | ORAL | 0 refills | Status: AC | PRN

## 2024-05-04 NOTE — Progress Notes (Signed)
 Nursing Pain Medication Assessment:  Safety precautions to be maintained throughout the outpatient stay will include: orient to surroundings, keep bed in low position, maintain call bell within reach at all times, provide assistance with transfer out of bed and ambulation.  Medication Inspection Compliance: Pill count conducted under aseptic conditions, in front of the patient. Neither the pills nor the bottle was removed from the patient's sight at any time. Once count was completed pills were immediately returned to the patient in their original bottle.  Medication: Hydrocodone /APAP Pill/Patch Count: 62 of 60 pills/patches remain Pill/Patch Appearance: Markings consistent with prescribed medication Bottle Appearance: Standard pharmacy container. Clearly labeled. Filled Date: 05 / 24 / 2025 Last Medication intake:  Today

## 2024-05-04 NOTE — Progress Notes (Signed)
 PROVIDER NOTE: Interpretation of information contained herein should be left to medically-trained personnel. Specific patient instructions are provided elsewhere under "Patient Instructions" section of medical record. This document was created in part using AI and STT-dictation technology, any transcriptional errors that may result from this process are unintentional.  Patient: Gregory Harrison  Service: E/M   PCP: Vallorie Gayer, DO  DOB: Nov 12, 1951  DOS: 05/04/2024  Provider: Cherylin Corrigan, NP  MRN: [id number]  Delivery: Face-to-face  Specialty: Interventional Pain Management  Type: Established Patient  Setting: Ambulatory outpatient facility  Specialty designation: 09  Referring Prov.: Vallorie Gayer, DO  Location: Outpatient office facility       History of present illness (HPI) [name], a 73 y.o. year old male, is here today because of his Chronic pain syndrome [G89.4]. Gregory Harrison primary complain today is Back Pain (Lower bilateral, ) and Shoulder Pain (Left)  Pertinent problems: Gregory Harrison does not have any pertinent problems on file.  Pain Assessment: Severity of Chronic pain is reported as a 10-Worst pain ever/10. Location: Back (left shoulder) Lower, Left, Right/? back pain into both legs. Onset: More than a month ago. Quality:  . Timing: Constant. Modifying factor(s): cortisone injections into left shoulder.  medications. Vitals:  height is 6\' 2"  (1.88 m) and weight is 172 lb (78 kg). His temporal temperature is 97.6 F (36.4 C). His blood pressure is 143/73 (abnormal) and his pulse is 62. His respiration is 16 and oxygen saturation is 100%.  BMI: Estimated body mass index is 22.08 kg/m as calculated from the following:   Height as of this encounter: 6\' 2"  (1.88 m).   Weight as of this encounter: 172 lb (78 kg).  Last encounter: 02/12/2024 Last procedure: 05/01/2020  Reason for encounter: medication management. No change in medical history since last visit.  Patient's pain  is at baseline.  Patient continues multimodal pain regimen as prescribed.  States that it provides pain relief and improvement in functional status.  The patient complains of bilateral lower back pain and left shoulder pain, for which he usually receives cortisone injection from his primary care provider.   Pharmacotherapy Assessment  Analgesic: Hydrocodone -acetaminophen  (Norco/Vicodin) 5-325 mg tablet every 12 hours as needed for pain. MME=10 Gabapentin  400 mg capsule  Monitoring: [place] PMP: PDMP reviewed during this encounter.       Pharmacotherapy: No side-effects or adverse reactions reported. Compliance: No problems identified. Effectiveness: Clinically acceptable.  Malon Seamen, RN  05/04/2024  8:45 AM  Sign when Signing Visit Nursing Pain Medication Assessment:  Safety precautions to be maintained throughout the outpatient stay will include: orient to surroundings, keep bed in low position, maintain call bell within reach at all times, provide assistance with transfer out of bed and ambulation.  Medication Inspection Compliance: Pill count conducted under aseptic conditions, in front of the patient. Neither the pills nor the bottle was removed from the patient's sight at any time. Once count was completed pills were immediately returned to the patient in their original bottle.  Medication: Hydrocodone /APAP Pill/Patch Count: 62 of 60 pills/patches remain Pill/Patch Appearance: Markings consistent with prescribed medication Bottle Appearance: Standard pharmacy container. Clearly labeled. Filled Date: 05 / 24 / 2025 Last Medication intake:  Today    No results found for: "CBDTHCR" No results found for: "D8THCCBX" No results found for: "D9THCCBX"  UDS:  Summary  Date Value Ref Range Status  11/18/2023 FINAL  Final    Comment:    ==================================================================== ToxASSURE Select 13  (  MW) ==================================================================== Specimen Alert ToxAssure, ToxAssure FLEX or MAT drug testing: -Engineer, production- Result certification performed at [employer], [address], [place], [place] ==================================================================== Test                             Result       Flag       Units  Drug Present and Declared for Prescription Verification   Hydrocodone                     1710         EXPECTED   ng/mg creat   Hydromorphone                   290          EXPECTED   ng/mg creat   Dihydrocodeine                 138          EXPECTED   ng/mg creat   Norhydrocodone                 926          EXPECTED   ng/mg creat    Sources of hydrocodone  include scheduled prescription medications.    Hydromorphone , dihydrocodeine and norhydrocodone are expected    metabolites of hydrocodone . Hydromorphone  and dihydrocodeine are    also available as scheduled prescription medications.  Drug Absent but Declared for Prescription Verification   Alcohol , Ethyl                 Not Detected UNEXPECTED g/dL ==================================================================== Test                      Result    Flag   Units      Ref Range   Creatinine              39               mg/dL      >=16 ==================================================================== Declared Medications:  The flagging and interpretation on this report are based on the  following declared medications.  Unexpected results may arise from  inaccuracies in the declared medications.   **Note: The testing scope of this panel includes these medications:   Alcohol , Ethyl  Hydrocodone  (Norco)   **Note: The testing scope of this panel does not include the  following reported medications:   Acetaminophen  (Norco)  Amlodipine  (Norvasc )  Atorvastatin  (Lipitor)  Empagliflozin  (Jardiance )  Gabapentin  (Neurontin )  Hydrochlorothiazide   Insulin   (Basaglar )  Metformin  (Glucophage )  Topical  Valsartan  (Diovan ) ==================================================================== For clinical consultation, please call [phone]. ====================================================================      ROS  Constitutional: Denies any fever or chills Gastrointestinal: No reported hemesis, hematochezia, vomiting, or acute GI distress Musculoskeletal: Bilateral low back pain, left shoulder pain Neurological: No reported episodes of acute onset apraxia, aphasia, dysarthria, agnosia, amnesia, paralysis, loss of coordination, or loss of consciousness  Medication Review  Alcohol  Swabs , Emollient, HYDROcodone -acetaminophen , Insulin  Pen Needle, ONE TOUCH DELICA LANCING DEV, OneTouch Delica Lancets Fine, OneTouch Verio, amLODipine , atorvastatin , blood glucose meter kit and supplies, empagliflozin , gabapentin , glucose blood, insulin  glargine, insulin  lispro, metFORMIN , and valsartan -hydrochlorothiazide   History Review  Allergy: Gregory Harrison has no known allergies. Drug: Gregory Harrison  reports that he does not currently use drugs after having used the following drugs: Marijuana. Alcohol :  reports that he does not  currently use alcohol . Tobacco:  reports that he quit smoking about 36 years ago. His smoking use included cigarettes. He started smoking about 56 years ago. He has a 40 pack-year smoking history. He has never used smokeless tobacco. Social: Gregory Harrison  reports that he quit smoking about 36 years ago. His smoking use included cigarettes. He started smoking about 56 years ago. He has a 40 pack-year smoking history. He has never used smokeless tobacco. He reports that he does not currently use alcohol . He reports that he does not currently use drugs after having used the following drugs: Marijuana. Medical:  has a past medical history of Arthritis, Back skin lesion (03/12/2022), Biceps tendinitis, left (05/19/2023), Cancer (HCC) (2023),  Chronic left shoulder pain (02/26/2018), Diabetes (HCC) (2000), Diabetic neuropathy (HCC), GERD (gastroesophageal reflux disease), Hand lesion (05/09/2022), HTN (hypertension) (2010), Hypercholesterolemia, Impingement of right shoulder (07/30/2022), Pain in joint, shoulder region (03/24/2019), Post-viral cough syndrome (10/20/2020), Rotator cuff impingement syndrome, left (05/19/2023), Solitary pulmonary nodule on lung CT (08/19/2022), Subacute cough (08/09/2022), Trigger finger of right thumb (06/04/2018), Trigger finger, right little finger (01/15/2020), Vision problem, and Weight loss (07/16/2019). Surgical: Gregory Harrison  has a past surgical history that includes Hand surgery (1980s); Nose surgery (1979); Eye surgery (Left, 1990s); Tonsillectomy; and Cholecystectomy (N/A, 10/16/2022). Family: family history includes ALS in his brother; Diabetes in his paternal grandfather; Heart attack in his father; Heart murmur in his father; Other in his mother.  Laboratory Chemistry Profile   Renal Lab Results  Component Value Date   BUN 23 04/27/2024   CREATININE 1.20 04/27/2024   BCR 19 04/27/2024   GFRAA 88 10/19/2020   GFRNONAA >60 10/08/2022    Hepatic Lab Results  Component Value Date   AST 12 08/07/2023   ALT 15 08/07/2023   ALBUMIN 4.2 08/07/2023   ALKPHOS 72 08/07/2023    Electrolytes Lab Results  Component Value Date   NA 136 04/27/2024   K 5.0 04/27/2024   CL 98 04/27/2024   CALCIUM  9.8 04/27/2024    Bone No results found for: "VD25OH", "VD125OH2TOT", "BJ4782NF6", "OZ3086VH8", "25OHVITD1", "25OHVITD2", "25OHVITD3", "TESTOFREE", "TESTOSTERONE"  Inflammation (CRP: Acute Phase) (ESR: Chronic Phase) Lab Results  Component Value Date   CRP 8 04/07/2024   ESRSEDRATE 20 08/08/2022         Note: Above Lab results reviewed.  Recent Imaging Review  CT Chest Wo Contrast CLINICAL DATA:  Follow-up pulmonary nodule  EXAM: CT CHEST WITHOUT CONTRAST  TECHNIQUE: Multidetector CT imaging  of the chest was performed following the standard protocol without IV contrast.  RADIATION DOSE REDUCTION: This exam was performed according to the departmental dose-optimization program which includes automated exposure control, adjustment of the mA and/or kV according to patient size and/or use of iterative reconstruction technique.  COMPARISON:  08/21/2023, 08/14/2022  FINDINGS: Cardiovascular: Heart is normal size. Aorta is normal caliber. Coronary artery and aortic atherosclerosis.  Mediastinum/Nodes: No mediastinal, hilar, or axillary adenopathy. Trachea and esophagus are unremarkable. Thyroid  unremarkable.  Lungs/Pleura: 9 mm left upper lobe pulmonary nodule again noted, unchanged dating back to 08/14/2022. Few other scattered 2-3 mm bilateral upper lobe pulmonary nodules are stable. No new or enlarging pulmonary nodules. No confluent airspace opacities or effusions.  Upper Abdomen: No acute findings  Musculoskeletal: Chest wall soft tissues are unremarkable. No acute bony abnormality.  IMPRESSION: Scattered bilateral pulmonary nodules, the largest 9 mm in the left upper lobe. Recommend continued follow-up with repeat CT in 1 year.  Coronary artery disease.  Aortic Atherosclerosis (ICD10-I70.0).  Electronically Signed   By: Janeece Mechanic M.D.   On: 04/24/2024 00:26 Note: Reviewed         Physical Exam  General appearance: Well nourished, well developed, and well hydrated. In no apparent acute distress Mental status: Alert, oriented x 3 (person, place, & time)       Respiratory: No evidence of acute respiratory distress Eyes: PERLA Vitals: BP (!) 143/73 (BP Location: Right Arm, Patient Position: Sitting, Cuff Size: Normal)   Pulse 62   Temp 97.6 F (36.4 C) (Temporal)   Resp 16   Ht 6\' 2"  (1.88 m)   Wt 172 lb (78 kg)   SpO2 100%   BMI 22.08 kg/m  BMI: Estimated body mass index is 22.08 kg/m as calculated from the following:   Height as of this  encounter: 6\' 2"  (1.88 m).   Weight as of this encounter: 172 lb (78 kg). Ideal: Ideal body weight: 82.2 kg (181 lb 3.5 oz)  Assessment   Diagnosis Status  1. Chronic pain syndrome   2. Chronic left shoulder pain   3. Primary osteoarthritis of left shoulder   4. Diabetic polyneuropathy associated with type 2 diabetes mellitus (HCC)   5. Lumbar facet arthropathy   6. Medication management    Controlled Controlled Controlled   Updated Problems: Problem  Medication Management    Plan of Care  Problem-specific:  Assessment and Plan We will continue on current medication regimen.  Prescribing drug monitoring (PDMP) reviewed; findings consistent with use of prescribed medication and no evidence of narcotic misuse or abuse. Urine drug screening (UDS) up-to-date.  Schedule follow-up in 90 days.  No other new issues or problems reported to this visit.    Gregory Harrison has a current medication list which includes the following long-term medication(s): amlodipine , atorvastatin , gabapentin , insulin  glargine, insulin  lispro, metformin , and valsartan -hydrochlorothiazide .  Pharmacotherapy (Medications Ordered): Meds ordered this encounter  Medications   HYDROcodone -acetaminophen  (NORCO/VICODIN) 5-325 MG tablet    Sig: Take 1 tablet by mouth every 12 (twelve) hours as needed for severe pain (pain score 7-10). Must last 30 days.    Dispense:  60 tablet    Refill:  0    Chronic Pain: STOP Act (Not applicable) Fill 1 day early if closed on refill date. Avoid benzodiazepines within 8 hours of opioids   HYDROcodone -acetaminophen  (NORCO/VICODIN) 5-325 MG tablet    Sig: Take 1 tablet by mouth every 12 (twelve) hours as needed for severe pain (pain score 7-10). Must last 30 days.    Dispense:  60 tablet    Refill:  0    Chronic Pain: STOP Act (Not applicable) Fill 1 day early if closed on refill date. Avoid benzodiazepines within 8 hours of opioids   HYDROcodone -acetaminophen  (NORCO/VICODIN)  5-325 MG tablet    Sig: Take 1 tablet by mouth every 12 (twelve) hours as needed for severe pain (pain score 7-10). Must last 30 days.    Dispense:  60 tablet    Refill:  0    Chronic Pain: STOP Act (Not applicable) Fill 1 day early if closed on refill date. Avoid benzodiazepines within 8 hours of opioids   Orders:  No orders of the defined types were placed in this encounter.       Return in about 3 months (around 08/04/2024) for (F2F), (MM), Gregory Slain NP.    Recent Visits Date Type Provider Dept  02/12/24 Office Visit Cephus Collin, MD [facility]  Showing recent visits within past 62  days and meeting all other requirements Today's Visits Date Type Provider Dept  05/04/24 Office Visit Bethanny Toelle K, NP [facility]  Showing today's visits and meeting all other requirements Future Appointments No visits were found meeting these conditions. Showing future appointments within next 90 days and meeting all other requirements  I discussed the assessment and treatment plan with the patient. The patient was provided an opportunity to ask questions and all were answered. The patient agreed with the plan and demonstrated an understanding of the instructions.  Patient advised to call back or seek an in-person evaluation if the symptoms or condition worsens.  Duration of encounter: 30 minutes.  Total time on encounter, as per AMA guidelines included both the face-to-face and non-face-to-face time personally spent by the physician and/or other qualified health care professional(s) on the day of the encounter (includes time in activities that require the physician or other qualified health care professional and does not include time in activities normally performed by clinical staff). Physician's time may include the following activities when performed: Preparing to see the patient (e.g., pre-charting review of records, searching for previously ordered imaging, lab work, and  nerve conduction tests) Review of prior analgesic pharmacotherapies. Reviewing PMP Interpreting ordered tests (e.g., lab work, imaging, nerve conduction tests) Performing post-procedure evaluations, including interpretation of diagnostic procedures Obtaining and/or reviewing separately obtained history Performing a medically appropriate examination and/or evaluation Counseling and educating the patient/family/caregiver Ordering medications, tests, or procedures Referring and communicating with other health care professionals (when not separately reported) Documenting clinical information in the electronic or other health record Independently interpreting results (not separately reported) and communicating results to the patient/ family/caregiver Care coordination (not separately reported)  Note by: Haydan Mansouri K Dakiya Puopolo, NP (TTS and AI technology used. I apologize for any typographical errors that were not detected and corrected.) Date: 05/04/2024; Time: 9:09 AM

## 2024-05-06 ENCOUNTER — Encounter: Admitting: Student in an Organized Health Care Education/Training Program

## 2024-05-12 ENCOUNTER — Ambulatory Visit (INDEPENDENT_AMBULATORY_CARE_PROVIDER_SITE_OTHER): Admitting: Pharmacist

## 2024-05-12 ENCOUNTER — Encounter: Payer: Self-pay | Admitting: Pharmacist

## 2024-05-12 DIAGNOSIS — E1165 Type 2 diabetes mellitus with hyperglycemia: Secondary | ICD-10-CM

## 2024-05-12 DIAGNOSIS — I152 Hypertension secondary to endocrine disorders: Secondary | ICD-10-CM | POA: Diagnosis not present

## 2024-05-12 DIAGNOSIS — E1159 Type 2 diabetes mellitus with other circulatory complications: Secondary | ICD-10-CM

## 2024-05-12 DIAGNOSIS — Z794 Long term (current) use of insulin: Secondary | ICD-10-CM | POA: Diagnosis not present

## 2024-05-12 MED ORDER — INSULIN GLARGINE 100 UNIT/ML SOLOSTAR PEN: 40.0000 [IU] | PEN_INJECTOR | Freq: Every day | SUBCUTANEOUS | Status: DC

## 2024-05-12 MED ORDER — VALSARTAN-HYDROCHLOROTHIAZIDE 160-25 MG PO TABS: 1.0000 | ORAL_TABLET | Freq: Every day | ORAL | 3 refills | Status: AC

## 2024-05-12 MED ORDER — INSULIN LISPRO (1 UNIT DIAL) 100 UNIT/ML (KWIKPEN): 12.0000 [IU] | PEN_INJECTOR | Freq: Three times a day (TID) | SUBCUTANEOUS | Status: DC

## 2024-05-12 NOTE — Patient Instructions (Signed)
 It was nice to see you today!  Please work on limiting.  Your goal blood sugar is 80-130 before eating and less than 180 after eating.  Medication Changes: START taking your mealtime insulin  12 units with at least 1 meal per day.   CONTINUE taking your long-acting insulin  in the morning at 40 units.   Continue all other medication the same.   Monitor blood sugars at home and keep a log (glucometer or piece of paper) to bring with you to your next visit.  Keep up the good work with diet and exercise. Aim for a diet full of vegetables, fruit and lean meats (chicken, [place], fish). Try to limit salt intake by eating fresh or frozen vegetables (instead of canned).

## 2024-05-12 NOTE — Progress Notes (Signed)
    S:     Chief Complaint  Patient presents with   Medication Management    Daibetes and Hypertension   73 y.o. male who presents for diabetes evaluation, education, and management. Patient arrives in good spirits and presents without any assistance.    Patient was referred and last seen by Primary Care Provider, Dr. Baby Bolt, on 04/20/24. At last visit, with me we clarified dosing of lower dose valsartan .   PMH is significant for Diabetes and Hypertension.  Patient reports Diabetes was diagnosed in 2000.   Current diabetes medications include: Lantus  (insulin  glargine) 40 units daily, metformin  1,000 mg BID, Jardiance  (empagliflozin ) 25 mg daily, only taken Humalog  (insulin  lispro 3 times since last visit) Current hypertension medications include: amlodipine  10 mg, valsartan /hydrochlorothiazide  160/25 mg daily Current hyperlipidemia medications include: atorvastatin  40 mg daily  Patient denies adherence with medications, reports missing mealtime insulin  due to fear of low glucose reading.    Do you feel that your medications are working for you? yes  Patient reports a single hypoglycemic event - managed appropriately. .  Reported home fasting blood sugars: 124-194  Reported 2 hour post-meal/random blood sugars: 112-265 (typically higher 100's and lower 200s).  Patient reports continued neuropathy (nerve pain).  Patient-reported exercise habits: Active including cutting grass and working in the yard  O:   Review of Systems  Musculoskeletal:  Positive for back pain and joint pain.  Neurological:  Positive for tingling (burning in feet).  All other systems reviewed and are negative.   Physical Exam Vitals reviewed.  Constitutional:      Appearance: Normal appearance.  Pulmonary:     Effort: Pulmonary effort is normal.  Neurological:     Mental Status: He is alert.  Psychiatric:        Mood and Affect: Mood normal.    Lab Results  Component Value Date   HGBA1C  10.7 (A) 04/07/2024   Vitals:   05/12/24 0914  BP: 132/64  Pulse: 72  SpO2: 100%    Lipid Panel     Component Value Date/Time   CHOL 187 04/07/2024 0952   TRIG 317 (H) 04/07/2024 0952   HDL 30 (L) 04/07/2024 0952   CHOLHDL 6.2 (H) 04/07/2024 0952   LDLCALC 103 (H) 04/07/2024 0952   LDLDIRECT 134 (H) 02/24/2018 1613    A/P: Diabetes longstanding since 2000 currently with improved control based on home glucose readings compared to last A1c of 10.7%. Recent improvement due to adherence to insulin . Patient is able to verbalize appropriate hypoglycemia management plan. Medication adherence non-adherence with prandial insulin  is problematic.  - Re-educated of the need to use Humalog  (insulin  lispro) before supper (or largest meal of the day) - encouraged to take 12 units daily.  Continue with plan to increase to additional meals at future visits or follow-up contacts.  - Continued Lantus  (insulin  glargine) 40 units daily and metformin .    Hypertension longstanding since 2010 currently much improved control and near goal today.  Blood pressure goal of <130/80 mmHg as he appears to be tolerating current dose without any dizziness.  - Continue amlodipine  10 mg daily  - Continue valsartan /hydrochlorothiazide  to 160/25 mg daily  Plan for BMP at next PCP visit.     Written patient instructions provided. Patient verbalized understanding of treatment plan.  Total time in face to face counseling 24 minutes.    Follow-up:  Pharmacist PRN PCP clinic visit in 2 weeks

## 2024-05-12 NOTE — Assessment & Plan Note (Signed)
 Hypertension longstanding since 2010 currently much improved control and near goal today.  Blood pressure goal of <130/80 mmHg as he appears to be tolerating current dose without any dizziness.  - Continue amlodipine  10 mg daily  - Continue valsartan /hydrochlorothiazide  to 160/25 mg daily  Plan for BMP at next PCP visit.

## 2024-05-12 NOTE — Assessment & Plan Note (Signed)
 Diabetes longstanding since 2000 currently with improved control based on home glucose readings compared to last A1c of 10.7%. Recent improvement due to adherence to insulin . Patient is able to verbalize appropriate hypoglycemia management plan. Medication adherence non-adherence with prandial insulin  is problematic.  - Re-educated of the need to use Humalog  (insulin  lispro) before supper (or largest meal of the day) - encouraged to take 12 units daily.  Continue with plan to increase to additional meals at future visits or follow-up contacts.  - Continued Lantus  (insulin  glargine) 40 units daily and metformin .

## 2024-05-13 NOTE — Progress Notes (Signed)
 Reviewed and agree with Dr Macky Lower plan.

## 2024-05-14 NOTE — Progress Notes (Signed)
 Reviewed and agree with Dr Macky Lower plan.

## 2024-05-25 ENCOUNTER — Ambulatory Visit (INDEPENDENT_AMBULATORY_CARE_PROVIDER_SITE_OTHER): Admitting: Student

## 2024-05-25 VITALS — BP 130/80 | HR 74 | Ht 74.0 in | Wt 177.6 lb

## 2024-05-25 DIAGNOSIS — Z794 Long term (current) use of insulin: Secondary | ICD-10-CM

## 2024-05-25 DIAGNOSIS — E1165 Type 2 diabetes mellitus with hyperglycemia: Secondary | ICD-10-CM

## 2024-05-25 DIAGNOSIS — M12812 Other specific arthropathies, not elsewhere classified, left shoulder: Secondary | ICD-10-CM

## 2024-05-25 MED ORDER — NAPROXEN 500 MG PO TABS
500.0000 mg | ORAL_TABLET | Freq: Two times a day (BID) | ORAL | 0 refills | Status: DC
Start: 2024-05-25 — End: 2024-09-13

## 2024-05-25 MED ORDER — INSULIN LISPRO (1 UNIT DIAL) 100 UNIT/ML (KWIKPEN)
8.0000 [IU] | PEN_INJECTOR | Freq: Every day | SUBCUTANEOUS | Status: AC
Start: 2024-05-25 — End: ?

## 2024-05-25 NOTE — Patient Instructions (Addendum)
 It was great seeing you today.  As we discussed, - REDUCE your humalog  (insulin  lispro) to 8 units with largest meal of the day. - Naproxen for shoulder pain, sent to pharmacy - Lidocaine  patch for shoulder pain  - Return in 2 weeks for follow-up. Schedule at the front desk before leaving today.   If you have any questions or concerns, please feel free to call the clinic.   Have a wonderful day,  Dr. Vallorie Gayer [facility] [phone]

## 2024-05-25 NOTE — Assessment & Plan Note (Signed)
 Start naproxen 500 mg BID, take with meals Continue pain regimen with norco, prescribed by pain specialist Lidocaine  patches provided Will consider steroid injection when A1c is improved

## 2024-05-25 NOTE — Progress Notes (Signed)
    SUBJECTIVE:   CHIEF COMPLAINT / HPI:   T2DM -Last saw Dr. Koval on 05/12/2024 -CBG x2 low blood sugar at nighttime down to 60 which was symptomatic at 0300 (then ate banana/peanut butter which he keeps at bedside) to help -Does not want to get a CGM because it feels like it will dictate what he can eat -Fasting sugar 155 this morning -Taking Humalog  (insulin  lispro) 12 units with dinner usually, but breakfast is his largest meal (bacon, toast, cheerios) -Taking Lantus  (insulin  glargine) 40 units daily  Rotator cuff arthropathy, left -Longstanding, pain mostly at night -No new changes -Wanted steroid injection today, but discussed A1c is not at appropriate number to administer - Takes chronic pain medication (hydrocodone -acetaminophen ) prescribed by pain specialist  PERTINENT  PMH / PSH:  HTN, T2DM, Hx osteoarthritis in multiple sites and rotator cuff arhtropathy  OBJECTIVE:   BP 130/80   Pulse 74   Ht 6' 2 (1.88 m)   Wt 177 lb 9.6 oz (80.6 kg)   SpO2 97%   BMI 22.80 kg/m   General: Well-appearing, no distress. Ambulates without assistance Cardiac: RRR Neuro: A&O Respiratory: normal WOB on RA. No wheezing or crackles on auscultation, good lung sounds throughout Extremities: Moving all 4 extremities equally, thin extremities  ASSESSMENT/PLAN:   Type 2 diabetes mellitus with hyperglycemia (HCC) Improving, but having hypoglycemia (re-discussed hypoglycemia management) Adherent to insulin  regimen with most blood sugars in 100-200s - Reduce insulin  lispro (Humalog ) to 8 units before breakfast which is usually largest meal of day - Continue insulin  glargine (Lantus ) 40 units daily and metformin   Rotator cuff arthropathy Start naproxen 500 mg BID, take with meals Continue pain regimen with norco, prescribed by pain specialist Lidocaine  patches provided Will consider steroid injection when A1c is improved    Vallorie Gayer, DO [facility]

## 2024-05-25 NOTE — Assessment & Plan Note (Signed)
 Improving, but having hypoglycemia (re-discussed hypoglycemia management) Adherent to insulin  regimen with most blood sugars in 100-200s - Reduce insulin  lispro (Humalog ) to 8 units before breakfast which is usually largest meal of day - Continue insulin  glargine (Lantus ) 40 units daily and metformin 

## 2024-06-08 ENCOUNTER — Ambulatory Visit (INDEPENDENT_AMBULATORY_CARE_PROVIDER_SITE_OTHER): Admitting: Family Medicine

## 2024-06-08 ENCOUNTER — Telehealth: Payer: Self-pay

## 2024-06-08 ENCOUNTER — Encounter: Payer: Self-pay | Admitting: Family Medicine

## 2024-06-08 VITALS — BP 137/70 | HR 56 | Temp 98.1°F | Wt 180.0 lb

## 2024-06-08 DIAGNOSIS — Z794 Long term (current) use of insulin: Secondary | ICD-10-CM | POA: Diagnosis not present

## 2024-06-08 DIAGNOSIS — E1159 Type 2 diabetes mellitus with other circulatory complications: Secondary | ICD-10-CM

## 2024-06-08 DIAGNOSIS — E1165 Type 2 diabetes mellitus with hyperglycemia: Secondary | ICD-10-CM | POA: Diagnosis not present

## 2024-06-08 DIAGNOSIS — M12812 Other specific arthropathies, not elsewhere classified, left shoulder: Secondary | ICD-10-CM

## 2024-06-08 DIAGNOSIS — J302 Other seasonal allergic rhinitis: Secondary | ICD-10-CM

## 2024-06-08 DIAGNOSIS — I152 Hypertension secondary to endocrine disorders: Secondary | ICD-10-CM

## 2024-06-08 MED ORDER — FLUTICASONE PROPIONATE 50 MCG/ACT NA SUSP: 2.0000 | Freq: Every day | NASAL | 6 refills | Status: AC

## 2024-06-08 MED ORDER — CETIRIZINE HCL 10 MG PO TABS: 10.0000 mg | ORAL_TABLET | Freq: Every day | ORAL | 11 refills | Status: AC

## 2024-06-08 MED ORDER — LIDOCAINE 5 % EX PTCH: 1.0000 | MEDICATED_PATCH | CUTANEOUS | 0 refills | Status: AC

## 2024-06-08 NOTE — Telephone Encounter (Signed)
 Pharmacy Patient Advocate Encounter   Received notification from CoverMyMeds that prior authorization for LIDOCAINE  5% PATCHES is required/requested.   Insurance verification completed.   The patient is insured through [facility] [employer]/RX ADVANCE .   PA required; PA submitted to above mentioned insurance via CoverMyMeds Key/confirmation #/[id number]. Status is pending

## 2024-06-08 NOTE — Assessment & Plan Note (Addendum)
 Well-controlled on current regimen.  No concerning symptoms. -BMP today given recent medication changes by Dr. Koval

## 2024-06-08 NOTE — Assessment & Plan Note (Signed)
 No hypoglycemic episodes since most recent insulin  change. - Continue metformin  1000 mg twice daily, Lantus  40 units daily, short acting insulin  8 units daily. - Continue to monitor sugars

## 2024-06-08 NOTE — Progress Notes (Signed)
    SUBJECTIVE:   CHIEF COMPLAINT / HPI:   T2DM Patient was seen recently for concerns of hypoglycemia episodes.  At that time his short acting insulin  was increased to 8 units daily. He is currently taking metformin  1000 mg BID, 40U long acting and 8U short acting once daily.  No missed doses. Sugar 150 this AM, he monitors regularly. No low sugar events, no symptoms of shakiness, dizziness, headache, vision changes. Due for foot exam today.  Concerned about allergies Congestion, post nasal drip, never had allergies before. Feels like he has water  in his ear. Cough up stuff every morning. No fevers or sick symptoms. ENT/audiology evaluation suspected allergies - getting hearing aids later this month. Stopped smoking in 1989.  PERTINENT  PMH / PSH: Reviewed. HTN, T2DM HLD Neuropathy OA Chronic pain  OBJECTIVE:   BP 137/70   Pulse (!) 56   Temp 98.1 F (36.7 C) (Oral)   Wt 180 lb (81.6 kg)   SpO2 98%   BMI 23.11 kg/m    General: Well-appearing, no acute distress. HEENT: normocephalic, PERRLA, EOM grossly intact.  Bilateral TMs clear without bulging or erythema. Cardio: Regular rate, regular rhythm, no murmurs on exam. Pulm: Clear, no wheezing, no crackles. No increased work of breathing. Extremities: no peripheral edema. Moves all extremities equally. Neuro: Alert and oriented x3, speech normal in content, no facial asymmetry. Psych:  Cognition and judgment appear intact.   ASSESSMENT/PLAN:   Assessment & Plan Type 2 diabetes mellitus with hyperglycemia, with long-term current use of insulin  (HCC) No hypoglycemic episodes since most recent insulin  change. - Continue metformin  1000 mg twice daily, Lantus  40 units daily, short acting insulin  8 units daily. - Continue to monitor sugars Seasonal allergies Given symptoms patient may be developing allergies.  No concerns on exam. - Prescribed Flonase and cetirizine  to use daily Hypertension associated with diabetes  (HCC) Well-controlled on current regimen.  No concerning symptoms. -BMP today given recent medication changes by Dr. Amalia Lauraine Norse, DO [facility]

## 2024-06-08 NOTE — Patient Instructions (Addendum)
 It was so good to see you today! Thank you for allowing me to take care of you.  Today we discussed the following concerns and plans:  Diabetes -Continue taking your insulin  and metformin  as prescribed. Keep checking your sugars at home.  Allergies - I am sending in 2 prescriptions: a nasal spray called Flonase and a daily allergy pill called cetirizine . If your insurance does not cover these you can buy them over the counter at the pharmacy.  If you have any concerns, please call the clinic or schedule an appointment.  It was a pleasure to take care of you today. Be well!  Lauraine Norse, DO [facility], PGY-1   Don't forget to check out the [facility] in [facility] at [address] [phone] Affordable prices on prescriptions and over-the-counter items, as well as services like vaccinations and medication home delivery.

## 2024-06-09 ENCOUNTER — Ambulatory Visit: Payer: Self-pay | Admitting: Family Medicine

## 2024-06-09 LAB — BASIC METABOLIC PANEL WITH GFR
BUN/Creatinine Ratio: 21 (ref 10–24)
BUN: 22 mg/dL (ref 8–27)
CO2: 21 mmol/L (ref 20–29)
Calcium: 9.7 mg/dL (ref 8.6–10.2)
Chloride: 103 mmol/L (ref 96–106)
Creatinine, Ser: 1.05 mg/dL (ref 0.76–1.27)
Glucose: 195 mg/dL — ABNORMAL HIGH (ref 70–99)
Potassium: 4.9 mmol/L (ref 3.5–5.2)
Sodium: 142 mmol/L (ref 134–144)
eGFR: 74 mL/min/{1.73_m2} (ref >59)

## 2024-06-10 NOTE — Telephone Encounter (Signed)
 Pharmacy Patient Advocate Encounter  Received notification from [employer]RX [employer] that Prior Authorization for LIDOCAINE  5% PATCHES has been DENIED.  Full denial letter will be uploaded to the media tab. See denial reason below.    LIDOCAINE  4% PATCHES AVAILABLE OTC  PA #/Case ID/Reference #: [id number]

## 2024-07-20 ENCOUNTER — Other Ambulatory Visit: Payer: Self-pay

## 2024-07-21 NOTE — Telephone Encounter (Signed)
 Called patient and he was very belligerent and angry because I called him to inquire about the RX for Naproxyn.  Patient sates that he will call us  when he needs us . Says  do not call me because I do not have time to deal with this shit  States that he has told multiple people that.  He was very nasty so I told him to call us  when he wants to make an appointment and hung up.  Cena JONELLE Pesa, CMA

## 2024-07-26 ENCOUNTER — Telehealth: Payer: Self-pay | Admitting: Pharmacist

## 2024-07-26 NOTE — Telephone Encounter (Signed)
 Attempted to contact patient for follow-up of Diabetes medication management   Left HIPAA compliant voice mail requesting call back to direct phone: [phone]  Total time with patient call and documentation of interaction: 1 minute.

## 2024-08-03 ENCOUNTER — Encounter: Payer: Self-pay | Admitting: Nurse Practitioner

## 2024-08-03 ENCOUNTER — Ambulatory Visit: Attending: Nurse Practitioner | Admitting: Nurse Practitioner

## 2024-08-03 DIAGNOSIS — M47816 Spondylosis without myelopathy or radiculopathy, lumbar region: Secondary | ICD-10-CM | POA: Insufficient documentation

## 2024-08-03 DIAGNOSIS — E1142 Type 2 diabetes mellitus with diabetic polyneuropathy: Secondary | ICD-10-CM | POA: Diagnosis present

## 2024-08-03 DIAGNOSIS — M19012 Primary osteoarthritis, left shoulder: Secondary | ICD-10-CM | POA: Diagnosis not present

## 2024-08-03 DIAGNOSIS — G894 Chronic pain syndrome: Secondary | ICD-10-CM | POA: Diagnosis not present

## 2024-08-03 DIAGNOSIS — M25512 Pain in left shoulder: Secondary | ICD-10-CM | POA: Insufficient documentation

## 2024-08-03 DIAGNOSIS — Z7984 Long term (current) use of oral hypoglycemic drugs: Secondary | ICD-10-CM

## 2024-08-03 DIAGNOSIS — G8929 Other chronic pain: Secondary | ICD-10-CM | POA: Diagnosis present

## 2024-08-03 MED ORDER — HYDROCODONE-ACETAMINOPHEN 5-325 MG PO TABS: 1.0000 | ORAL_TABLET | Freq: Two times a day (BID) | ORAL | 0 refills | Status: DC | PRN

## 2024-08-03 MED ORDER — HYDROCODONE-ACETAMINOPHEN 5-325 MG PO TABS: 1.0000 | ORAL_TABLET | Freq: Two times a day (BID) | ORAL | 0 refills | Status: AC | PRN

## 2024-08-03 MED ORDER — GABAPENTIN 400 MG PO CAPS
1200.0000 mg | ORAL_CAPSULE | Freq: Every day | ORAL | 3 refills | Status: AC
Start: 2024-08-03 — End: ?

## 2024-08-03 NOTE — Progress Notes (Signed)
 Nursing Pain Medication Assessment:  Safety precautions to be maintained throughout the outpatient stay will include: orient to surroundings, keep bed in low position, maintain call bell within reach at all times, provide assistance with transfer out of bed and ambulation.  Medication Inspection Compliance: Pill count conducted under aseptic conditions, in front of the patient. Neither the pills nor the bottle was removed from the patient's sight at any time. Once count was completed pills were immediately returned to the patient in their original bottle.  Medication: Hydrocodone /APAP Pill/Patch Count: 6 of 60 pills/patches remain Pill/Patch Appearance: Markings consistent with prescribed medication Bottle Appearance: Standard pharmacy container. Clearly labeled. Filled Date: 07 / 28 / 2025 Last Medication intake:  Yesterday

## 2024-08-03 NOTE — Progress Notes (Signed)
 PROVIDER NOTE: Interpretation of information contained herein should be left to medically-trained personnel. Specific patient instructions are provided elsewhere under Patient Instructions section of medical record. This document was created in part using AI and STT-dictation technology, any transcriptional errors that may result from this process are unintentional.  Patient: Gregory Harrison  Service: E/M   PCP: Tharon Lung, MD  DOB: July 11, 1951  DOS: 08/03/2024  Provider: Emmy MARLA Blanch, NP  MRN: [id number]  Delivery: Face-to-face  Specialty: Interventional Pain Management  Type: Established Patient  Setting: Ambulatory outpatient facility  Specialty designation: 09  Referring Prov.: Dartha Geralds, DO  Location: Outpatient office facility       History of present illness (HPI) [name], a 73 y.o. year old male, is here today because of his No primary diagnosis found.. Mr. Matsushita's primary complain today is Back Pain, Knee Pain, and Shoulder Pain  Pertinent problems: Mr. Bogan has Type 2 diabetes mellitus with hyperglycemia (HCC); Carpal tunnel syndrome of right wrist; Lumbar facet arthropathy; Lumbar degenerative disc disease; Primary osteoarthritis of left shoulder; Bilateral primary osteoarthritis of knee; and Chronic pain syndrome on their pertinent problem list.   Pain Assessment: Severity of Chronic pain is reported as a 9 /10. Location: Back Lower/Ocassionally into legs. Onset: More than a month ago. Quality: Aching. Timing: Constant. Modifying factor(s): Medications. Vitals:  height is 6' 2.5 (1.892 m) and weight is 179 lb (81.2 kg). His temperature is 98.1 F (36.7 C). His blood pressure is 117/69 and his pulse is 64. His respiration is 20 and oxygen saturation is 98%.  BMI: Estimated body mass index is 22.67 kg/m as calculated from the following:   Height as of this encounter: 6' 2.5 (1.892 m).   Weight as of this encounter: 179 lb (81.2 kg).  Last encounter: 05/04/2024. Last  procedure: Visit date not found.  Reason for encounter: medication management. No change in medical history since last visit.  Patient's pain is at baseline.  Patient continues multimodal pain regimen as prescribed.  States that it provides pain relief and improvement in functional status.   Pharmacotherapy Assessment   Analgesic: Hydrocodone -acetaminophen  (Norco/Vicodin) 5-325 mg tablet every 12 hours as needed for pain. MME=10 Gabapentin  400 mg capsule Monitoring: [place] PMP: PDMP reviewed during this encounter.       Pharmacotherapy: No side-effects or adverse reactions reported. Compliance: No problems identified. Effectiveness: Clinically acceptable.  Erlene Doyal SAUN[place], [place]  08/03/2024  9:18 AM  Sign when Signing Visit Nursing Pain Medication Assessment:  Safety precautions to be maintained throughout the outpatient stay will include: orient to surroundings, keep bed in low position, maintain call bell within reach at all times, provide assistance with transfer out of bed and ambulation.  Medication Inspection Compliance: Pill count conducted under aseptic conditions, in front of the patient. Neither the pills nor the bottle was removed from the patient's sight at any time. Once count was completed pills were immediately returned to the patient in their original bottle.  Medication: Hydrocodone /APAP Pill/Patch Count: 6 of 60 pills/patches remain Pill/Patch Appearance: Markings consistent with prescribed medication Bottle Appearance: Standard pharmacy container. Clearly labeled. Filled Date: 07 / 28 / 2025 Last Medication intake:  Yesterday    UDS:  Summary  Date Value Ref Range Status  11/18/2023 FINAL  Final    Comment:    ==================================================================== ToxASSURE Select 13 (MW) ==================================================================== Specimen Alert ToxAssure, ToxAssure FLEX or MAT drug testing: -Technical component- Result  certification performed at [facility], [address],  [place], [place] ==================================================================== Test                             Result       Flag       Units  Drug Present and Declared for Prescription Verification   Hydrocodone                     1710         EXPECTED   ng/mg creat   Hydromorphone                   290          EXPECTED   ng/mg creat   Dihydrocodeine                 138          EXPECTED   ng/mg creat   Norhydrocodone                 926          EXPECTED   ng/mg creat    Sources of hydrocodone  include scheduled prescription medications.    Hydromorphone , dihydrocodeine and norhydrocodone are expected    metabolites of hydrocodone . Hydromorphone  and dihydrocodeine are    also available as scheduled prescription medications.  Drug Absent but Declared for Prescription Verification   Alcohol , Ethyl                 Not Detected UNEXPECTED g/dL ==================================================================== Test                      Result    Flag   Units      Ref Range   Creatinine              39               mg/dL      >=79 ==================================================================== Declared Medications:  The flagging and interpretation on this report are based on the  following declared medications.  Unexpected results may arise from  inaccuracies in the declared medications.   **Note: The testing scope of this panel includes these medications:   Alcohol , Ethyl  Hydrocodone  (Norco)   **Note: The testing scope of this panel does not include the  following reported medications:   Acetaminophen  (Norco)  Amlodipine  (Norvasc )  Atorvastatin  (Lipitor)  Empagliflozin  (Jardiance )  Gabapentin  (Neurontin )  Hydrochlorothiazide   Insulin  (Basaglar )  Metformin  (Glucophage )  Topical  Valsartan  (Diovan ) ==================================================================== For clinical consultation,  please call [phone]. ====================================================================     No results found for: CBDTHCR No results found for: D8THCCBX No results found for: D9THCCBX  ROS  Constitutional: Denies any fever or chills Gastrointestinal: No reported hemesis, hematochezia, vomiting, or acute GI distress Musculoskeletal: Back Pain, Knee Pain, and Shoulder Pain (left) Neurological: No reported episodes of acute onset apraxia, aphasia, dysarthria, agnosia, amnesia, paralysis, loss of coordination, or loss of consciousness  Medication Review  Alcohol  Swabs , Emollient, HYDROcodone -acetaminophen , Insulin  Pen Needle, ONE TOUCH DELICA LANCING DEV, OneTouch Delica Lancets Fine, OneTouch Verio, amLODipine , atorvastatin , blood glucose meter kit and supplies, cetirizine , empagliflozin , fluticasone , gabapentin , glucose blood, insulin  glargine, insulin  lispro, lidocaine , metFORMIN , naproxen , and valsartan -hydrochlorothiazide   History Review  Allergy: Mr. Moure has no known allergies. Drug: Mr. Westrup  reports that he does not currently use drugs after having used the following drugs: Marijuana. Alcohol :  reports that he does not currently use alcohol . Tobacco:  reports that he quit smoking about 36 years ago. His smoking use included cigarettes. He started smoking about 56 years ago. He has a 40 pack-year smoking history. He has never used smokeless tobacco. Social: Mr. Osment  reports that he quit smoking about 36 years ago. His smoking use included cigarettes. He started smoking about 56 years ago. He has a 40 pack-year smoking history. He has never used smokeless tobacco. He reports that he does not currently use alcohol . He reports that he does not currently use drugs after having used the following drugs: Marijuana. Medical:  has a past medical history of Arthritis, Back skin lesion (03/12/2022), Biceps tendinitis, left (05/19/2023), Cancer (HCC) (2023), Chronic left  shoulder pain (02/26/2018), Diabetes (HCC) (2000), Diabetic neuropathy (HCC), GERD (gastroesophageal reflux disease), Hand lesion (05/09/2022), HTN (hypertension) (2010), Hypercholesterolemia, Impingement of right shoulder (07/30/2022), Pain in joint, shoulder region (03/24/2019), Post-viral cough syndrome (10/20/2020), Rotator cuff impingement syndrome, left (05/19/2023), Solitary pulmonary nodule on lung CT (08/19/2022), Subacute cough (08/09/2022), Trigger finger of right thumb (06/04/2018), Trigger finger, right little finger (01/15/2020), Vision problem, and Weight loss (07/16/2019). Surgical: Mr. Hanley  has a past surgical history that includes Hand surgery (1980s); Nose surgery (1979); Eye surgery (Left, 1990s); Tonsillectomy; and Cholecystectomy (N/A, 10/16/2022). Family: family history includes ALS in his brother; Diabetes in his paternal grandfather; Heart attack in his father; Heart murmur in his father; Other in his mother.  Laboratory Chemistry Profile   Renal Lab Results  Component Value Date   BUN 22 06/08/2024   CREATININE 1.05 06/08/2024   BCR 21 06/08/2024   GFRAA 88 10/19/2020   GFRNONAA >60 10/08/2022    Hepatic Lab Results  Component Value Date   AST 12 08/07/2023   ALT 15 08/07/2023   ALBUMIN 4.2 08/07/2023   ALKPHOS 72 08/07/2023    Electrolytes Lab Results  Component Value Date   NA 142 06/08/2024   K 4.9 06/08/2024   CL 103 06/08/2024   CALCIUM  9.7 06/08/2024    Bone No results found for: VD25OH, VD125OH2TOT, CI6874NY7, CI7874NY7, 25OHVITD1, 25OHVITD2, 25OHVITD3, TESTOFREE, TESTOSTERONE  Inflammation (CRP: Acute Phase) (ESR: Chronic Phase) Lab Results  Component Value Date   CRP 8 04/07/2024   ESRSEDRATE 20 08/08/2022         Note: Above Lab results reviewed.  Recent Imaging Review  CT Chest Wo Contrast CLINICAL DATA:  Follow-up pulmonary nodule  EXAM: CT CHEST WITHOUT CONTRAST  TECHNIQUE: Multidetector CT imaging of the  chest was performed following the standard protocol without IV contrast.  RADIATION DOSE REDUCTION: This exam was performed according to the departmental dose-optimization program which includes automated exposure control, adjustment of the mA and/or kV according to patient size and/or use of iterative reconstruction technique.  COMPARISON:  08/21/2023, 08/14/2022  FINDINGS: Cardiovascular: Heart is normal size. Aorta is normal caliber. Coronary artery and aortic atherosclerosis.  Mediastinum/Nodes: No mediastinal, hilar, or axillary adenopathy. Trachea and esophagus are unremarkable. Thyroid  unremarkable.  Lungs/Pleura: 9 mm left upper lobe pulmonary nodule again noted, unchanged dating back to 08/14/2022. Few other scattered 2-3 mm bilateral upper lobe pulmonary nodules are stable. No new or enlarging pulmonary nodules. No confluent airspace opacities or effusions.  Upper Abdomen: No acute findings  Musculoskeletal: Chest wall soft tissues are unremarkable. No acute bony abnormality.  IMPRESSION: Scattered bilateral pulmonary nodules, the largest 9 mm in the left upper lobe. Recommend continued follow-up with repeat CT in 1 year.  Coronary artery disease.  Aortic Atherosclerosis (ICD10-I70.0).  Electronically Signed   By:  Franky Crease M.D.   On: 04/24/2024 00:26 Note: Reviewed        Physical Exam  Vitals: BP 117/69   Pulse 64   Temp 98.1 F (36.7 C)   Resp 20   Ht 6' 2.5 (1.892 m)   Wt 179 lb (81.2 kg)   SpO2 98%   BMI 22.67 kg/m  BMI: Estimated body mass index is 22.67 kg/m as calculated from the following:   Height as of this encounter: 6' 2.5 (1.892 m).   Weight as of this encounter: 179 lb (81.2 kg). Ideal: Ideal body weight: 83.4 kg (183 lb 12.1 oz) General appearance: Well nourished, well developed, and well hydrated. In no apparent acute distress Mental status: Alert, oriented x 3 (person, place, & time)       Respiratory: No evidence of acute  respiratory distress Eyes: PERLA   Assessment   Diagnosis Status  1. Chronic pain syndrome   2. Chronic left shoulder pain   3. Primary osteoarthritis of left shoulder   4. Diabetic polyneuropathy associated with type 2 diabetes mellitus (HCC)   5. Lumbar facet arthropathy   6. Type 2 diabetes mellitus with diabetic polyneuropathy, without long-term current use of insulin  (HCC)    Controlled Controlled Controlled   Updated Problems: No problems updated.  Plan of Care  Problem-specific:  Assessment and Plan  We will continue on current medication regimen.  Prescribing drug monitoring (PDMP) reviewed; findings consistent with use of prescribed medication and no evidence of narcotic misuse or abuse. Urine drug screening (UDS) up-to-date.  Schedule follow-up in 90 days.  No other new issues or problems reported to this visit.   Mr. BROLY HATFIELD has a current medication list which includes the following long-term medication(s): amlodipine , atorvastatin , cetirizine , fluticasone , insulin  glargine, insulin  lispro, metformin , valsartan -hydrochlorothiazide , and gabapentin .  Pharmacotherapy (Medications Ordered): Meds ordered this encounter  Medications   HYDROcodone -acetaminophen  (NORCO/VICODIN) 5-325 MG tablet    Sig: Take 1 tablet by mouth every 12 (twelve) hours as needed for severe pain (pain score 7-10). Must last 30 days.    Dispense:  60 tablet    Refill:  0    Chronic Pain: STOP Act (Not applicable) Fill 1 day early if closed on refill date. Avoid benzodiazepines within 8 hours of opioids   HYDROcodone -acetaminophen  (NORCO/VICODIN) 5-325 MG tablet    Sig: Take 1 tablet by mouth every 12 (twelve) hours as needed for severe pain (pain score 7-10). Must last 30 days.    Dispense:  60 tablet    Refill:  0    Chronic Pain: STOP Act (Not applicable) Fill 1 day early if closed on refill date. Avoid benzodiazepines within 8 hours of opioids   HYDROcodone -acetaminophen  (NORCO/VICODIN)  5-325 MG tablet    Sig: Take 1 tablet by mouth every 12 (twelve) hours as needed for severe pain (pain score 7-10). Must last 30 days.    Dispense:  60 tablet    Refill:  0    Chronic Pain: STOP Act (Not applicable) Fill 1 day early if closed on refill date. Avoid benzodiazepines within 8 hours of opioids   gabapentin  (NEURONTIN ) 400 MG capsule    Sig: Take 3 capsules (1,200 mg total) by mouth at bedtime.    Dispense:  270 capsule    Refill:  3   Orders:  No orders of the defined types were placed in this encounter.       Return in about 3 months (around 11/03/2024) for (F2F), (MM), Aarianna Hoadley  Latressa Harries NP.    Recent Visits No visits were found meeting these conditions. Showing recent visits within past 90 days and meeting all other requirements Today's Visits Date Type Provider Dept  08/03/24 Office Visit Alania Overholt K, NP [facility]  Showing today's visits and meeting all other requirements Future Appointments Date Type Provider Dept  10/28/24 Appointment Varina Hulon K, NP [facility]  Showing future appointments within next 90 days and meeting all other requirements  I discussed the assessment and treatment plan with the patient. The patient was provided an opportunity to ask questions and all were answered. The patient agreed with the plan and demonstrated an understanding of the instructions.  Patient advised to call back or seek an in-person evaluation if the symptoms or condition worsens.  Duration of encounter: 30 minutes.  Total time on encounter, as per AMA guidelines included both the face-to-face and non-face-to-face time personally spent by the physician and/or other qualified health care professional(s) on the day of the encounter (includes time in activities that require the physician or other qualified health care professional and does not include time in activities normally performed by clinical staff). Physician's time may include the following  activities when performed: Preparing to see the patient (e.g., pre-charting review of records, searching for previously ordered imaging, lab work, and nerve conduction tests) Review of prior analgesic pharmacotherapies. Reviewing PMP Interpreting ordered tests (e.g., lab work, imaging, nerve conduction tests) Performing post-procedure evaluations, including interpretation of diagnostic procedures Obtaining and/or reviewing separately obtained history Performing a medically appropriate examination and/or evaluation Counseling and educating the patient/family/caregiver Ordering medications, tests, or procedures Referring and communicating with other health care professionals (when not separately reported) Documenting clinical information in the electronic or other health record Independently interpreting results (not separately reported) and communicating results to the patient/ family/caregiver Care coordination (not separately reported)  Note by: Bodin Gorka K Kirin Pastorino, NP (TTS and AI technology used. I apologize for any typographical errors that were not detected and corrected.) Date: 08/03/2024; Time: 10:51 AM

## 2024-09-09 LAB — OPHTHALMOLOGY REPORT-SCANNED

## 2024-09-13 ENCOUNTER — Ambulatory Visit: Admitting: Family Medicine

## 2024-09-13 VITALS — BP 133/63 | HR 69 | Ht 74.5 in | Wt 169.4 lb

## 2024-09-13 DIAGNOSIS — E1165 Type 2 diabetes mellitus with hyperglycemia: Secondary | ICD-10-CM

## 2024-09-13 DIAGNOSIS — M65351 Trigger finger, right little finger: Secondary | ICD-10-CM

## 2024-09-13 DIAGNOSIS — Z794 Long term (current) use of insulin: Secondary | ICD-10-CM

## 2024-09-13 DIAGNOSIS — M19012 Primary osteoarthritis, left shoulder: Secondary | ICD-10-CM

## 2024-09-13 MED ORDER — DICLOFENAC SODIUM 1 % EX GEL: 2.0000 g | Freq: Four times a day (QID) | CUTANEOUS | 1 refills | Status: AC | PRN

## 2024-09-13 MED ORDER — METHYLPREDNISOLONE ACETATE 40 MG/ML IJ SUSP
20.0000 mg | Freq: Once | INTRAMUSCULAR | Status: AC
  Administered 2024-09-13: 20 mg via INTRAMUSCULAR

## 2024-09-13 MED ORDER — METHYLPREDNISOLONE ACETATE 40 MG/ML IJ SUSP
40.0000 mg | Freq: Once | INTRAMUSCULAR | Status: AC
  Administered 2024-09-13: 40 mg via INTRAMUSCULAR

## 2024-09-13 NOTE — Assessment & Plan Note (Signed)
 PROCEDURE: INJECTION Patient was given informed consent, signed copy in the chart. Appropriate time out was taken. Area prepped and draped in usual sterile fashion. Ethyl chloride was  used for local anesthesia. A 21 gauge 1 1/2 inch needle was used.. 1 cc of methylprednisolone  40 mg/ml plus  4 cc of 1% lidocaine  without epinephrine  was injected into the left shoulder using a(n) posterior subacromial approach.  The patient tolerated the procedure well. There were no complications. Post procedure instructions and return precautions were given.  - Recommended acetaminophen  up to 3000 mg daily (advised caution with Norco given acetaminophen  content, do not mix two medicines) - Avoid NSAIDs due to age as well as cardiac risk factors (T2DM and atherosclerosis); discontinued naproxen  - Prescribed Voltaren  gel, recommended using 4 times daily topically - Patient declines PT at the moment, lives very active lifestyle

## 2024-09-13 NOTE — Assessment & Plan Note (Signed)
 A1c previously poorly controlled.  Preventative screenings up-to-date.  Patient politely declined A1c today due to multiple other sticks, will check at return visit in 4 weeks. - Continue current metformin , empagliflozin , and insulin  regimen - Patient was counseled that he may experience transient elevations in blood glucoses over the next 2 to 3 days given steroid injection

## 2024-09-13 NOTE — Patient Instructions (Addendum)
 It was great to see you today! Thank you for choosing [facility] for your primary care.  Today we addressed: Shoulder injection and Trigger finger injection Today you received an injection with corticosteroid. This injection is usually done in response to pain and inflammation. There is some numbing medicine also in the shot so the injected area may be numb and feel really good for the next couple of hours. The numbing medicine usually wears off in 2-3 hours though, and then your pain level will be right back where it was before the injection.   The actually benefit from the steroid injection is usually noticed in 2-7 days. You may actually experience a small (as in 10%) INCREASE in pain in the first 24 hours---that is common.   Things to watch out for that you should contact us  or a health care provider urgently would include: 1. Unusual (as in more than 10%) increase in pain 2. New fever > 101.5 3. New swelling or redness of the injected area.  4. Streaking of red lines around the area injected.    You can use the Voltaren  multiple times daily on any joint that hurts.  You can take Tylenol  (acetaminophen ) up to 2000-3000 mg daily, but do not mix it with your Norco because they both have acetaminophen  in them.  You should return to our clinic in 4 weeks for a trigger finger injection.   Thank you for coming to see us  at [facility] and for the opportunity to care for you! Drew Herman, MD 09/13/2024, 9:10 AM

## 2024-09-13 NOTE — Progress Notes (Signed)
 SUBJECTIVE:   CHIEF COMPLAINT / HPI:  Gregory Harrison is a 73 y.o. male with a pertinent past medical history of osteoarthritis of multiple joints and T2DM dependent on insulin  presenting to the clinic for left shoulder injection.  Left shoulder pain Patient reports arthritis in his hands and shoulders. The joints are stiff and sore in the morning and get worse with use throughout the day. 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 Patient denies evidence of joint infection. Previous joint injection of right shoulder which helped tremendously, had prior injection of left shoulder which helped mildly.  Right little finger getting stuck Patient reports that this finger has been getting stuck intermittently for multiple years and he has to straighten it out manually with other hand. Denies injury in that area.  Type 2 diabetes mellitus - Last A1c 10.7 on 04/07/2024 - Home CBGs: 200s, checks daily - Medications: 1000 mg glargine 40 units daily, lispro 8 units mealtime coverage, Jardiance  25 mg daily - Adherence: Consistent - Eye exam: UTD - Foot exam: UTD - Microalbumin: UTD - Statin: Atorvastatin  40 mg - No symptoms of hypoglycemia, polyuria, polydipsia, numbness of extremities, foot ulcers/trauma  PERTINENT PMH / PSH: T2DM with insulin  dependence Chronic osteoarthritis of multiple joints HTN  *Remainder reviewed in problem list.   OBJECTIVE:   BP 133/63   Pulse 69   Ht 6' 2.5 (1.892 m)   Wt 169 lb 6.4 oz (76.8 kg)   SpO2 100%   BMI 21.46 kg/m   General: Age-appropriate, resting comfortably in chair, NAD, alert and at baseline. Cardiovascular: Regular rate and rhythm. Normal S1/S2. No murmurs, rubs, or gallops appreciated. 2+ radial pulses. Pulmonary: Normal WOB on room air. No accessory muscle use. Skin: Warm and dry.  No acute rashes grossly, no lesions or rashes overlying joint injection sites. Extremities: No  peripheral edema bilaterally. Capillary refill <2 seconds. MSK: Abduction of L shoulder limited past 100 degrees by stiffness but tolerates motion well.  Repeat catching of R little finger tendon sheath with flexion of PIP and MCP joints, palpable thickened tendon sheath.   ASSESSMENT/PLAN:   Assessment & Plan Primary osteoarthritis of left shoulder PROCEDURE: INJECTION Patient was given informed consent, signed copy in the chart. Appropriate time out was taken. Area prepped and draped in usual sterile fashion. Ethyl chloride was  used for local anesthesia. A 21 gauge 1 1/2 inch needle was used.. 1 cc of methylprednisolone  40 mg/ml plus  4 cc of 1% lidocaine  without epinephrine  was injected into the left shoulder using a(n) posterior subacromial approach.  The patient tolerated the procedure well. There were no complications. Post procedure instructions and return precautions were given.  - Recommended acetaminophen  up to 3000 mg daily (advised caution with Norco given acetaminophen  content, do not mix two medicines) - Avoid NSAIDs due to age as well as cardiac risk factors (T2DM and atherosclerosis); discontinued naproxen  - Prescribed Voltaren  gel, recommended using 4 times daily topically - Patient declines PT at the moment, lives very active lifestyle Trigger little finger of right hand PROCEDURE: INJECTION Patient was given informed consent, signed copy in the chart. Appropriate time out was taken. Area prepped and draped in usual sterile fashion. Ethyl chloride was  used for local anesthesia. A 21 gauge 1 1/2 inch needle was used.SABRA 0.5 cc of methylprednisolone  40 mg/ml plus  0.5 cc of 1% lidocaine  without epinephrine  was injected into the right little finger tendon sheath  using a(n) palmar approach.   The patient tolerated the procedure well. There were no complications. Post procedure instructions and return precautions were given.  - Plan to return in 4 weeks for repeat injection given  extent of sheath stenosis/thickening Type 2 diabetes mellitus with hyperglycemia, with long-term current use of insulin  (HCC) A1c previously poorly controlled.  Preventative screenings up-to-date.  Patient politely declined A1c today due to multiple other sticks, will check at return visit in 4 weeks. - Continue current metformin , empagliflozin , and insulin  regimen - Patient was counseled that he may experience transient elevations in blood glucoses over the next 2 to 3 days given steroid injection  Follow up in 4 weeks scheduled for 11/3 at 9:30 am.  Gregory Silence, MD [facility]

## 2024-10-11 ENCOUNTER — Ambulatory Visit: Payer: Self-pay | Admitting: Family Medicine

## 2024-10-11 ENCOUNTER — Encounter: Payer: Self-pay | Admitting: Family Medicine

## 2024-10-11 VITALS — BP 125/64 | HR 74 | Ht 74.5 in | Wt 164.8 lb

## 2024-10-11 DIAGNOSIS — M65351 Trigger finger, right little finger: Secondary | ICD-10-CM | POA: Diagnosis not present

## 2024-10-11 DIAGNOSIS — M19012 Primary osteoarthritis, left shoulder: Secondary | ICD-10-CM

## 2024-10-11 MED ORDER — METHYLPREDNISOLONE ACETATE 40 MG/ML IJ SUSP
40.0000 mg | Freq: Once | INTRAMUSCULAR | Status: AC
  Administered 2024-10-11: 40 mg via INTRAMUSCULAR

## 2024-10-11 NOTE — Assessment & Plan Note (Signed)
 Previously improved after shoulder injection on 10/6, however exacerbated after recent mechanical fall.  No bony stop offs, no pain on palpation to suggest fracture, no indication for x-ray.  Conservative management. - Continue Voltaren  gel 4 times daily - Recommended OTC lidocaine  patches as needed - Patient continues to decline PT

## 2024-10-11 NOTE — Patient Instructions (Signed)
 It was great to see you today! Thank you for choosing [facility] for your primary care.  Today we addressed: Trigger finger injections Today you received an injection with corticosteroid. This injection is usually done in response to pain and inflammation. There is some numbing medicine also in the shot so the injected area may be numb and feel really good for the next couple of hours. The numbing medicine usually wears off in 2-3 hours though, and then your pain level will be right back where it was before the injection.   The actually benefit from the steroid injection is usually noticed in 2-7 days. You may actually experience a small (as in 10%) INCREASE in pain in the first 24 hours---that is common.   Things to watch out for that you should contact us  or a health care provider urgently would include: 1. Unusual (as in more than 10%) increase in pain 2. New fever > 101.5 3. New swelling or redness of the injected area. 4. Streaking of red lines around the area injected.  You should return to our clinic as needed  Thank you for coming to see us  at [facility] and for the opportunity to care for you! Jacky Dross, MD 10/11/2024, 10:09 AM

## 2024-10-11 NOTE — Progress Notes (Cosign Needed Addendum)
   SUBJECTIVE:   CHIEF COMPLAINT / HPI:  Gregory Harrison is a 73 y.o. male with a pertinent past medical history of osteoarthritis of multiple joints and T2DM dependent on insulin  presenting to the clinic for right little trigger finger injection and osteoarthritis follow up.  Osteoarthritis, left shoulder Patient received steroid injection of left shoulder on 10/6. Reports improvement after injection. However, he experienced a mechanical fall approximately 2 weeks ago and since then shoulder has been hurting more. Denies limitation in range of motion of shoulder, no change in strength. Pain has been moderate soreness, dull.  Right trigger finger Doing better since last injection on 10/6, only rarely catching as it did prior. No severe reaction to prior injection, desires repeat.   PERTINENT PMH / PSH: T2DM with insulin  dependence Chronic osteoarthritis of multiple joints HTN  *Remainder reviewed in problem list.   OBJECTIVE:   BP 125/64   Pulse 74   Ht 6' 2.5 (1.892 m)   Wt 164 lb 12.8 oz (74.8 kg)   SpO2 100%   BMI 20.88 kg/m   General: Age-appropriate, resting comfortably in chair, NAD, alert and at baseline. Extremities: No peripheral edema bilaterally. Capillary refill <2 seconds. MSK: Right little finger trigger finger that intermittently catches, palpable tendon sheath knot.  Left shoulder with full passive and active ROM, not limited by pain.  5/5 strength in bilateral upper extremities, all major muscle groups.  No bony step-offs or deformities palpated in left shoulder.   ASSESSMENT/PLAN:   Assessment & Plan Primary osteoarthritis of left shoulder Previously improved after shoulder injection on 10/6, however exacerbated after recent mechanical fall.  No bony stop offs, no pain on palpation to suggest fracture, no indication for x-ray.  Conservative management. - Continue Voltaren  gel 4 times daily - Recommended OTC lidocaine  patches as needed - Patient  continues to decline PT Trigger little finger of right hand PROCEDURE: TRIGGER FINGER INJECTION Patient was given informed consent, signed copy in the chart. Appropriate time out was taken. Area prepped and draped in usual sterile fashion. Ethyl chloride was  used for local anesthesia. A 21 gauge 1 1/2 inch needle was used.. 2 cc of methylprednisolone  40 mg/ml plus 2 cc of 1% lidocaine  without epinephrine  was injected into the right 5th digit tendon sheath using a palmar approach.   The patient tolerated the procedure well. There were no complications. Post procedure instructions and return precautions were given.   Leshon Armistead Toma, MD [facility] ---------------------------------- Addendum:  Patient seen and examined in the office with resident.  History, exam, plan of care were precepted with me.  Agree with findings and plan as documented in resident note.  I was present for trigger finger injection.  Rainell Cedar, DO, CAQSM

## 2024-10-14 ENCOUNTER — Encounter

## 2024-10-28 ENCOUNTER — Encounter: Payer: Self-pay | Admitting: Nurse Practitioner

## 2024-10-28 ENCOUNTER — Ambulatory Visit: Attending: Nurse Practitioner | Admitting: Nurse Practitioner

## 2024-10-28 VITALS — BP 152/65 | HR 66 | Temp 97.1°F | Resp 18 | Ht 74.5 in | Wt 169.0 lb

## 2024-10-28 DIAGNOSIS — M533 Sacrococcygeal disorders, not elsewhere classified: Secondary | ICD-10-CM | POA: Insufficient documentation

## 2024-10-28 DIAGNOSIS — E1142 Type 2 diabetes mellitus with diabetic polyneuropathy: Secondary | ICD-10-CM | POA: Diagnosis present

## 2024-10-28 DIAGNOSIS — M25512 Pain in left shoulder: Secondary | ICD-10-CM | POA: Insufficient documentation

## 2024-10-28 DIAGNOSIS — Z79899 Other long term (current) drug therapy: Secondary | ICD-10-CM | POA: Diagnosis present

## 2024-10-28 DIAGNOSIS — M25552 Pain in left hip: Secondary | ICD-10-CM | POA: Diagnosis present

## 2024-10-28 DIAGNOSIS — G8929 Other chronic pain: Secondary | ICD-10-CM | POA: Insufficient documentation

## 2024-10-28 DIAGNOSIS — G894 Chronic pain syndrome: Secondary | ICD-10-CM | POA: Insufficient documentation

## 2024-10-28 DIAGNOSIS — M19012 Primary osteoarthritis, left shoulder: Secondary | ICD-10-CM | POA: Insufficient documentation

## 2024-10-28 DIAGNOSIS — M47816 Spondylosis without myelopathy or radiculopathy, lumbar region: Secondary | ICD-10-CM | POA: Insufficient documentation

## 2024-10-28 DIAGNOSIS — Z794 Long term (current) use of insulin: Secondary | ICD-10-CM

## 2024-10-28 MED ORDER — HYDROCODONE-ACETAMINOPHEN 5-325 MG PO TABS: 1.0000 | ORAL_TABLET | Freq: Two times a day (BID) | ORAL | 0 refills | Status: AC | PRN

## 2024-10-28 MED ORDER — NALOXONE HCL 4 MG/0.1ML NA LIQD: 1.0000 | NASAL | 1 refills | Status: AC | PRN

## 2024-10-28 NOTE — Progress Notes (Signed)
 Nursing Pain Medication Assessment:  Safety precautions to be maintained throughout the outpatient stay will include: orient to surroundings, keep bed in low position, maintain call bell within reach at all times, provide assistance with transfer out of bed and ambulation.  Medication Inspection Compliance: Pill count conducted under aseptic conditions, in front of the patient. Neither the pills nor the bottle was removed from the patient's sight at any time. Once count was completed pills were immediately returned to the patient in their original bottle.  Medication: Hydrocodone /APAP Pill/Patch Count: 23 of 60 pills/patches remain Pill/Patch Appearance: Markings consistent with prescribed medication Bottle Appearance: Standard pharmacy container. Clearly labeled. Filled Date: 29 / 06 / 2025 Last Medication intake:  Today

## 2024-10-28 NOTE — Progress Notes (Signed)
 PROVIDER NOTE: Interpretation of information contained herein should be left to medically-trained personnel. Specific patient instructions are provided elsewhere under Patient Instructions section of medical record. This document was created in part using AI and STT-dictation technology, any transcriptional errors that may result from this process are unintentional.  Patient: Gregory Harrison  Service: E/M   PCP: Gregory Lung, MD  DOB: 11-Sep-1951  DOS: 10/28/2024  Provider: Emmy MARLA Blanch, NP  MRN: [id number]  Delivery: Face-to-face  Specialty: Interventional Pain Management  Type: Established Patient  Setting: Ambulatory outpatient facility  Specialty designation: 09  Referring Prov.: Gregory Lung, MD  Location: Outpatient office facility       History of present illness (HPI) [name], a 73 y.o. year old male, is here today because of his back pain, left hip pain, and left leg pain. [name] primary complain today is Back Pain  Pertinent problems: Gregory Harrison has Type 2 diabetes mellitus with hyperglycemia (HCC); Carpal tunnel syndrome of right wrist; Lumbar facet arthropathy; Lumbar degenerative disc disease; Primary osteoarthritis of left shoulder; Bilateral primary osteoarthritis of knee; and Chronic pain syndrome on their pertinent problem list.   Pain Assessment: Severity of Chronic pain is reported as a 9 /10. Location: Back Lower/Radiates to left hip. Onset: More than a month ago. Quality: Sharp. Timing: Intermittent. Modifying factor(s): Pain Medication. Vitals:  height is 6' 2.5 (1.892 m) and weight is 169 lb (76.7 kg). His temporal temperature is 97.1 F (36.2 C) (abnormal). His blood pressure is 152/65 (abnormal) and his pulse is 66. His respiration is 18 and oxygen saturation is 100%.  BMI: Estimated body mass index is 21.41 kg/m as calculated from the following:   Height as of this encounter: 6' 2.5 (1.892 m).   Weight as of this encounter: 169 lb (76.7 kg).  Last  encounter: 08/03/2024. Last procedure: Visit date not found.  Reason for encounter: medication management. No change in medical history since last visit.  Patient's pain is at baseline.  Patient continues multimodal pain regimen as prescribed.  States that it provides pain relief and improvement in functional status.   Discussed the use of AI scribe software for clinical note transcription with the patient, who gave verbal consent to proceed.  History of Present Illness   Gregory Harrison is a 73 year old male who presents with left hip, left leg, and back pain.  He experiences pain in his left hip, left leg, and across his back. The pain in the shoulder has improved following a cortisone injection received in [place] though he does not recall the provider's name.  The pain in the hip and leg is localized to the left side, while the back pain extends across the entire back. The pain is influenced by movement, particularly in certain positions, but it is not continuous. He manages the pain with hydrocodone  5-325 mg, which he takes every 12 hours as needed, and occasionally more frequently when engaging in activities such as 'spitting wood.' No side effects from the medication, specifically denying constipation.  He received an injection in 2021 from Gregory Harrison, which is part of his prior treatment history.     Pharmacotherapy Assessment   Analgesic: Hydrocodone -acetaminophen  (Norco/Vicodin) 5-325 mg tablet every 12 hours as needed for pain. MME=10 Monitoring: Gregory Harrison PMP: PDMP reviewed during this encounter.       Pharmacotherapy: No side-effects or adverse reactions reported. Compliance: No problems identified. Effectiveness: Clinically acceptable.  Gregory Harrison  10/28/2024  8:19 AM  Sign when Signing Visit Nursing Pain Medication Assessment:  Safety precautions to be maintained throughout the outpatient stay will include: orient to surroundings, keep bed in low position, maintain call bell  within reach at all times, provide assistance with transfer out of bed and ambulation.  Medication Inspection Compliance: Pill count conducted under aseptic conditions, in front of the patient. Neither the pills nor the bottle was removed from the patient's sight at any time. Once count was completed pills were immediately returned to the patient in their original bottle.  Medication: Hydrocodone /APAP Pill/Patch Count: 23 of 60 pills/patches remain Pill/Patch Appearance: Markings consistent with prescribed medication Bottle Appearance: Standard pharmacy container. Clearly labeled. Filled Date: 16 / 06 / 2025 Last Medication intake:  Today    UDS:  Summary  Date Value Ref Range Status  11/18/2023 FINAL  Final    Comment:    ==================================================================== ToxASSURE Select 13 (MW) ==================================================================== Specimen Alert ToxAssure, ToxAssure FLEX or MAT drug testing: -Technical component- Result certification performed at [facility] [employer], [address], [place], [place] ==================================================================== Test                             Result       Flag       Units  Drug Present and Declared for Prescription Verification   Hydrocodone                     1710         EXPECTED   ng/mg creat   Hydromorphone                   290          EXPECTED   ng/mg creat   Dihydrocodeine                 138          EXPECTED   ng/mg creat   Norhydrocodone                 926          EXPECTED   ng/mg creat    Sources of hydrocodone  include scheduled prescription medications.    Hydromorphone , dihydrocodeine and norhydrocodone are expected    metabolites of hydrocodone . Hydromorphone  and dihydrocodeine are    also available as scheduled prescription medications.  Drug Absent but Declared for Prescription Verification   Alcohol , Ethyl                 Not Detected UNEXPECTED  g/dL ==================================================================== Test                      Result    Flag   Units      Ref Range   Creatinine              39               mg/dL      >=79 ==================================================================== Declared Medications:  The flagging and interpretation on this report are based on the  following declared medications.  Unexpected results may arise from  inaccuracies in the declared medications.   **Note: The testing scope of this panel includes these medications:   Alcohol , Ethyl  Hydrocodone  (Norco)   **Note: The testing scope of this panel does not include the  following reported medications:   Acetaminophen  (Norco)  Amlodipine  (Norvasc )  Atorvastatin  (Lipitor)  Empagliflozin  (  Jardiance )  Gabapentin  (Neurontin )  Hydrochlorothiazide   Insulin  (Basaglar )  Metformin  (Glucophage )  Topical  Valsartan  (Diovan ) ==================================================================== For clinical consultation, please call [phone]. ====================================================================     No results found for: CBDTHCR No results found for: D8THCCBX No results found for: D9THCCBX  ROS  Constitutional: Denies any fever or chills Gastrointestinal: No reported hemesis, hematochezia, vomiting, or acute GI distress Musculoskeletal: Low back pain, left leg pain, left hip pain Neurological: No reported episodes of acute onset apraxia, aphasia, dysarthria, agnosia, amnesia, paralysis, loss of coordination, or loss of consciousness  Medication Review  Alcohol  Swabs , Emollient, HYDROcodone -acetaminophen , Insulin  Pen Needle, ONE TOUCH DELICA LANCING DEV, OneTouch Delica Lancets Fine, OneTouch Verio, amLODipine , atorvastatin , blood glucose meter kit and supplies, cetirizine , diclofenac  Sodium, empagliflozin , fluticasone , gabapentin , glucose blood, insulin  glargine, insulin  lispro, lidocaine , metFORMIN ,  naloxone , and valsartan -hydrochlorothiazide   History Review  Allergy: Mr. Velez has no known allergies. Drug: Mr. Kimmons  reports that he does not currently use drugs after having used the following drugs: Marijuana. Alcohol :  reports that he does not currently use alcohol . Tobacco:  reports that he quit smoking about 36 years ago. His smoking use included cigarettes. He started smoking about 56 years ago. He has a 40 pack-year smoking history. He has never used smokeless tobacco. Social: Mr. Peppard  reports that he quit smoking about 36 years ago. His smoking use included cigarettes. He started smoking about 56 years ago. He has a 40 pack-year smoking history. He has never used smokeless tobacco. He reports that he does not currently use alcohol . He reports that he does not currently use drugs after having used the following drugs: Marijuana. Medical:  has a past medical history of Arthritis, Back skin lesion (03/12/2022), Biceps tendinitis, left (05/19/2023), Cancer (HCC) (2023), Chronic left shoulder pain (02/26/2018), Diabetes (HCC) (2000), Diabetic neuropathy (HCC), GERD (gastroesophageal reflux disease), Hand lesion (05/09/2022), HTN (hypertension) (2010), Hypercholesterolemia, Impingement of right shoulder (07/30/2022), Pain in joint, shoulder region (03/24/2019), Post-viral cough syndrome (10/20/2020), Rotator cuff impingement syndrome, left (05/19/2023), Solitary pulmonary nodule on [facility] CT (08/19/2022), Subacute cough (08/09/2022), Trigger finger of right thumb (06/04/2018), Trigger finger, right little finger (01/15/2020), Vision problem, and Weight loss (07/16/2019). Surgical: Mr. Wolters  has a past surgical history that includes Hand surgery (1980s); Nose surgery (1979); Eye surgery (Left, 1990s); Tonsillectomy; and Cholecystectomy (N/A, 10/16/2022). Family: family history includes ALS in his brother; Diabetes in his paternal grandfather; Heart attack in his father; Heart murmur in his father;  Other in his mother.  Laboratory Chemistry Profile   Renal Lab Results  Component Value Date   BUN 22 06/08/2024   CREATININE 1.05 06/08/2024   BCR 21 06/08/2024   GFRAA 88 10/19/2020   GFRNONAA >60 10/08/2022    Hepatic Lab Results  Component Value Date   AST 12 08/07/2023   ALT 15 08/07/2023   ALBUMIN 4.2 08/07/2023   ALKPHOS 72 08/07/2023    Electrolytes Lab Results  Component Value Date   NA 142 06/08/2024   K 4.9 06/08/2024   CL 103 06/08/2024   CALCIUM  9.7 06/08/2024    Bone No results found for: VD25OH, VD125OH2TOT, CI6874NY7, CI7874NY7, 25OHVITD1, 25OHVITD2, 25OHVITD3, TESTOFREE, TESTOSTERONE  Inflammation (CRP: Acute Phase) (ESR: Chronic Phase) Lab Results  Component Value Date   CRP 8 04/07/2024   ESRSEDRATE 20 08/08/2022         Note: Above Lab results reviewed.  Recent Imaging Review  CT Chest Wo Contrast CLINICAL DATA:  Follow-up pulmonary nodule  EXAM: CT CHEST WITHOUT CONTRAST  TECHNIQUE: Multidetector CT imaging of the chest was performed following the standard protocol without IV contrast.  RADIATION DOSE REDUCTION: This exam was performed according to the departmental dose-optimization program which includes automated exposure control, adjustment of the mA and/or kV according to patient size and/or use of iterative reconstruction technique.  COMPARISON:  08/21/2023, 08/14/2022  FINDINGS: Cardiovascular: Heart is normal size. Aorta is normal caliber. Coronary artery and aortic atherosclerosis.  Mediastinum/Nodes: No mediastinal, hilar, or axillary adenopathy. Trachea and esophagus are unremarkable. Thyroid  unremarkable.  Lungs/Pleura: 9 mm left upper lobe pulmonary nodule again noted, unchanged dating back to 08/14/2022. Few other scattered 2-3 mm bilateral upper lobe pulmonary nodules are stable. No new or enlarging pulmonary nodules. No confluent airspace opacities or effusions.  Upper Abdomen: No acute  findings  Musculoskeletal: Chest wall soft tissues are unremarkable. No acute bony abnormality.  IMPRESSION: Scattered bilateral pulmonary nodules, the largest 9 mm in the left upper lobe. Recommend continued follow-up with repeat CT in 1 year.  Coronary artery disease.  Aortic Atherosclerosis (ICD10-I70.0).  Electronically Signed   By: Franky Crease M.D.   On: 04/24/2024 00:26 Note: Reviewed        Physical Exam  Vitals: BP (!) 152/65 (BP Location: Right Arm, Patient Position: Sitting, Cuff Size: Normal)   Pulse 66   Temp (!) 97.1 F (36.2 C) (Temporal)   Resp 18   Ht 6' 2.5 (1.892 m)   Wt 169 lb (76.7 kg)   SpO2 100%   BMI 21.41 kg/m  BMI: Estimated body mass index is 21.41 kg/m as calculated from the following:   Height as of this encounter: 6' 2.5 (1.892 m).   Weight as of this encounter: 169 lb (76.7 kg). Ideal: Ideal body weight: 83.4 kg (183 lb 12.1 oz) General appearance: Well nourished, well developed, and well hydrated. In no apparent acute distress Mental status: Alert, oriented x 3 (person, place, & time)       Respiratory: No evidence of acute respiratory distress Eyes: PERLA  Musculoskeletal: +LBP Left hip pain, left leg pain Assessment   Diagnosis Status  1. Chronic left SI joint pain   2. Chronic left hip pain   3. Chronic pain syndrome   4. Medication management   5. Chronic left shoulder pain   6. Primary osteoarthritis of left shoulder   7. Diabetic polyneuropathy associated with type 2 diabetes mellitus (HCC)   8. Lumbar facet arthropathy    Controlled Controlled Controlled   Updated Problems:  Plan of Care  Problem-specific:  Assessment and Plan    Chronic pain syndrome involving left hip, left leg, back, and left shoulder Pain managed with hydrocodone  5-325 mg. No adverse reactions reported. - Continue hydrocodone  5-325 mg as needed. Patient's pain is controlled with hydrocodone , will continue on current medication regimen.   Prescribing drug monitoring (PDMP) reviewed, findings consistent with the use of prescribed medication and no evidence of narcotic misuse or abuse.  No adverse reaction or side effects reported to medication. Routine UDS ordered today.  Schedule follow-up in 90 days for medication management.  Lumbar spondylosis Contributes to diffuse back pain managed with hydrocodone .  Meds ordered this encounter  Medications   HYDROcodone -acetaminophen  (NORCO/VICODIN) 5-325 MG tablet    Sig: Take 1 tablet by mouth every 12 (twelve) hours as needed for severe pain (pain score 7-10). Must last 30 days.    Dispense:  60 tablet    Refill:  0    Chronic Pain: STOP Act (Not applicable) Fill 1  day early if closed on refill date. Avoid benzodiazepines within 8 hours of opioids   HYDROcodone -acetaminophen  (NORCO/VICODIN) 5-325 MG tablet    Sig: Take 1 tablet by mouth every 12 (twelve) hours as needed for severe pain (pain score 7-10). Must last 30 days.    Dispense:  60 tablet    Refill:  0    Chronic Pain: STOP Act (Not applicable) Fill 1 day early if closed on refill date. Avoid benzodiazepines within 8 hours of opioids   HYDROcodone -acetaminophen  (NORCO/VICODIN) 5-325 MG tablet    Sig: Take 1 tablet by mouth every 12 (twelve) hours as needed for severe pain (pain score 7-10). Must last 30 days.    Dispense:  60 tablet    Refill:  0    Chronic Pain: STOP Act (Not applicable) Fill 1 day early if closed on refill date. Avoid benzodiazepines within 8 hours of opioids   naloxone (NARCAN) nasal spray 4 mg/0.1 mL    Sig: Place 1 spray into the nose as needed for up to 365 doses (for opioid-induced respiratory depresssion). In case of emergency (overdose), spray once into each nostril. If no response within 3 minutes, repeat application and call 911.    Dispense:  1 each    Refill:  1        Mr. JOEY LIERMAN has a current medication list which includes the following long-term medication(s): amlodipine ,  atorvastatin , cetirizine , fluticasone , gabapentin , insulin  glargine, insulin  lispro, metformin , and valsartan -hydrochlorothiazide .  Pharmacotherapy (Medications Ordered): Meds ordered this encounter  Medications   HYDROcodone -acetaminophen  (NORCO/VICODIN) 5-325 MG tablet    Sig: Take 1 tablet by mouth every 12 (twelve) hours as needed for severe pain (pain score 7-10). Must last 30 days.    Dispense:  60 tablet    Refill:  0    Chronic Pain: STOP Act (Not applicable) Fill 1 day early if closed on refill date. Avoid benzodiazepines within 8 hours of opioids   HYDROcodone -acetaminophen  (NORCO/VICODIN) 5-325 MG tablet    Sig: Take 1 tablet by mouth every 12 (twelve) hours as needed for severe pain (pain score 7-10). Must last 30 days.    Dispense:  60 tablet    Refill:  0    Chronic Pain: STOP Act (Not applicable) Fill 1 day early if closed on refill date. Avoid benzodiazepines within 8 hours of opioids   HYDROcodone -acetaminophen  (NORCO/VICODIN) 5-325 MG tablet    Sig: Take 1 tablet by mouth every 12 (twelve) hours as needed for severe pain (pain score 7-10). Must last 30 days.    Dispense:  60 tablet    Refill:  0    Chronic Pain: STOP Act (Not applicable) Fill 1 day early if closed on refill date. Avoid benzodiazepines within 8 hours of opioids   naloxone (NARCAN) nasal spray 4 mg/0.1 mL    Sig: Place 1 spray into the nose as needed for up to 365 doses (for opioid-induced respiratory depresssion). In case of emergency (overdose), spray once into each nostril. If no response within 3 minutes, repeat application and call 911.    Dispense:  1 each    Refill:  1    Instruct patient in proper use of device.   Orders:  Orders Placed This Encounter  Procedures   ToxASSURE Select 13 (MW), Urine    Volume: 30 ml(s). Minimum 3 ml of urine is needed. Document temperature of fresh sample. Indications: Long term (current) use of opiate analgesic (S20.108)    Release to patient:   Immediate  Return in about 3 months (around 01/28/2025) for (F2F), (MM), Gregory Blanch NP.    Recent Visits Date Type Provider Dept  08/03/24 Office Visit Marvis Bakken K, NP [facility]  Showing recent visits within past 90 days and meeting all other requirements Today's Visits Date Type Provider Dept  10/28/24 Office Visit Gor Vestal K, NP [facility]  Showing today's visits and meeting all other requirements Future Appointments Date Type Provider Dept  01/24/25 Appointment Erol Flanagin K, NP [facility]  Showing future appointments within next 90 days and meeting all other requirements  I discussed the assessment and treatment plan with the patient. The patient was provided an opportunity to ask questions and all were answered. The patient agreed with the plan and demonstrated an understanding of the instructions.  Patient advised to call back or seek an in-person evaluation if the symptoms or condition worsens.  I personally spent a total of 30 minutes in the care of the patient today including preparing to see the patient, getting/reviewing separately obtained history, performing a medically appropriate exam/evaluation, counseling and educating, placing orders, referring and communicating with other health care professionals, documenting clinical information in the EHR, independently interpreting results, communicating results, and coordinating care.   Note by: Kace Hartje K Jawan Chavarria, NP (TTS and AI technology used. I apologize for any typographical errors that were not detected and corrected.) Date: 10/28/2024; Time: 8:52 AM

## 2024-10-28 NOTE — Patient Instructions (Signed)
 ______________________________________________________________________    Opioid Pain Medication Update  To: All patients taking opioid pain medications. (I.e.: hydrocodone , hydromorphone , oxycodone , oxymorphone, morphine, codeine, methadone, tapentadol, tramadol , buprenorphine, fentanyl , etc.)  Re: Updated review of side effects and adverse reactions of opioid analgesics, as well as new information about long term effects of this class of medications.  Direct risks of long-term opioid therapy are not limited to opioid addiction and overdose. Potential medical risks include serious fractures, breathing problems during sleep, hyperalgesia, immunosuppression, chronic constipation, bowel obstruction, myocardial infarction, and tooth decay secondary to xerostomia.  Unpredictable adverse effects that can occur even if you take your medication correctly: Cognitive impairment, respiratory depression, and death. Most people think that if they take their medication correctly, and as instructed, that they will be safe. Nothing could be farther from the truth. In reality, a significant amount of recorded deaths associated with the use of opioids has occurred in individuals that had taken the medication for a long time, and were taking their medication correctly. The following are examples of how this can happen: Patient taking his/her medication for a long time, as instructed, without any side effects, is given a certain antibiotic or another unrelated medication, which in turn triggers a Drug-to-drug interaction leading to disorientation, cognitive impairment, impaired reflexes, respiratory depression or an untoward event leading to serious bodily harm or injury, including death.  Patient taking his/her medication for a long time, as instructed, without any side effects, develops an acute impairment of liver and/or kidney function. This will lead to a rapid inability of the body to breakdown and eliminate  their pain medication, which will result in effects similar to an overdose, but with the same medicine and dose that they had always taken. This again may lead to disorientation, cognitive impairment, impaired reflexes, respiratory depression or an untoward event leading to serious bodily harm or injury, including death.  A similar problem will occur with patients as they grow older and their liver and kidney function begins to decrease as part of the aging process.  Background information: Historically, the original case for using long-term opioid therapy to treat chronic noncancer pain was based on safety assumptions that subsequent experience has called into question. In 1996, the American Pain Society and the American Academy of Pain Medicine issued a consensus statement supporting long-term opioid therapy. This statement acknowledged the dangers of opioid prescribing but concluded that the risk for addiction was low; respiratory depression induced by opioids was short-lived, occurred mainly in opioid-naive patients, and was antagonized by pain; tolerance was not a common problem; and efforts to control diversion should not constrain opioid prescribing. This has now proven to be wrong. Experience regarding the risks for opioid addiction, misuse, and overdose in community practice has failed to support these assumptions.  According to the Centers for Disease Control and Prevention, fatal overdoses involving opioid analgesics have increased sharply over the past decade. Currently, more than 96,700 people die from drug overdoses every year. Opioids are a factor in 7 out of every 10 overdose deaths. Deaths from drug overdose have surpassed motor vehicle accidents as the leading cause of death for individuals between the ages of 23 and 81.  Clinical data suggest that neuroendocrine dysfunction may be very common in both men and women, potentially causing hypogonadism, erectile dysfunction, infertility,  decreased libido, osteoporosis, and depression. Recent studies linked higher opioid dose to increased opioid-related mortality. Controlled observational studies reported that long-term opioid therapy may be associated with increased risk for cardiovascular events. Subsequent  meta-analysis concluded that the safety of long-term opioid therapy in elderly patients has not been proven.   Side Effects and adverse reactions: Common side effects: Drowsiness (sedation). Dizziness. Nausea and vomiting. Constipation. Physical dependence -- Dependence often manifests with withdrawal symptoms when opioids are discontinued or decreased. Tolerance -- As you take repeated doses of opioids, you require increased medication to experience the same effect of pain relief. Respiratory depression -- This can occur in healthy people, especially with higher doses. However, people with COPD, asthma or other lung conditions may be even more susceptible to fatal respiratory impairment.  Uncommon side effects: An increased sensitivity to feeling pain and extreme response to pain (hyperalgesia). Chronic use of opioids can lead to this. Delayed gastric emptying (the process by which the contents of your stomach are moved into your small intestine). Muscle rigidity. Immune system and hormonal dysfunction. Quick, involuntary muscle jerks (myoclonus). Arrhythmia. Itchy skin (pruritus). Dry mouth (xerostomia).  Long-term side effects: Chronic constipation. Sleep-disordered breathing (SDB). Increased risk of bone fractures. Hypothalamic-pituitary-adrenal dysregulation. Increased risk of overdose.  RISKS: Respiratory depression and death: Opioids increase the risk of respiratory depression and death.  Drug-to-drug interactions: Opioids are relatively contraindicated in combination with benzodiazepines, sleep inducers, and other central nervous system depressants. Other classes of medications (i.e.: certain antibiotics  and even over-the-counter medications) may also trigger or induce respiratory depression in some patients.  Medical conditions: Patients with pre-existing respiratory problems are at higher risk of respiratory failure and/or depression when in combination with opioid analgesics. Opioids are relatively contraindicated in some medical conditions such as central sleep apnea.   Fractures and Falls:  Opioids increase the risk and incidence of falls. This is of particular importance in elderly patients.  Endocrine System:  Long-term administration is associated with endocrine abnormalities (endocrinopathies). (Also known as Opioid-induced Endocrinopathy) Influences on both the hypothalamic-pituitary-adrenal axis?and the hypothalamic-pituitary-gonadal axis have been demonstrated with consequent hypogonadism and adrenal insufficiency in both sexes. Hypogonadism and decreased levels of dehydroepiandrosterone sulfate have been reported in men and women. Endocrine effects include: Amenorrhoea in women (abnormal absence of menstruation) Reduced libido in both sexes Decreased sexual function Erectile dysfunction in men Hypogonadisms (decreased testicular function with shrinkage of testicles) Infertility Depression and fatigue Loss of muscle mass Anxiety Depression Immune suppression Hyperalgesia Weight gain Anemia Osteoporosis Patients (particularly women of childbearing age) should avoid opioids. There is insufficient evidence to recommend routine monitoring of asymptomatic patients taking opioids in the long-term for hormonal deficiencies.  Immune System: Human studies have demonstrated that opioids have an immunomodulating effect. These effects are mediated via opioid receptors both on immune effector cells and in the central nervous system. Opioids have been demonstrated to have adverse effects on antimicrobial response and anti-tumour surveillance. Buprenorphine has been demonstrated to have  no impact on immune function.  Opioid Induced Hyperalgesia: Human studies have demonstrated that prolonged use of opioids can lead to a state of abnormal pain sensitivity, sometimes called opioid induced hyperalgesia (OIH). Opioid induced hyperalgesia is not usually seen in the absence of tolerance to opioid analgesia. Clinically, hyperalgesia may be diagnosed if the patient on long-term opioid therapy presents with increased pain. This might be qualitatively and anatomically distinct from pain related to disease progression or to breakthrough pain resulting from development of opioid tolerance. Pain associated with hyperalgesia tends to be more diffuse than the pre-existing pain and less defined in quality. Management of opioid induced hyperalgesia requires opioid dose reduction.  Cancer: Chronic opioid therapy has been associated with an increased risk  of cancer among noncancer patients with chronic pain. This association was more evident in chronic strong opioid users. Chronic opioid consumption causes significant pathological changes in the small intestine and colon. Epidemiological studies have found that there is a link between opium dependence and initiation of gastrointestinal cancers. Cancer is the second leading cause of death after cardiovascular disease. Chronic use of opioids can cause multiple conditions such as GERD, immunosuppression and renal damage as well as carcinogenic effects, which are associated with the incidence of cancers.   Mortality: Long-term opioid use has been associated with increased mortality among patients with chronic non-cancer pain (CNCP).  Prescription of long-acting opioids for chronic noncancer pain was associated with a significantly increased risk of all-cause mortality, including deaths from causes other than overdose.  Reference: Von Korff M, Kolodny A, Deyo RA, Chou R. Long-term opioid therapy reconsidered. Ann Intern Med. 2011 Sep 6;155(5):325-8. doi:  10.7326/[id number]. PMID: 78106373; PMCID: [id number]. Kit JINNY Laurence CINDERELLA Pearley JINNY, Hayward RA, Dunn KM, Jordan KP. Risk of adverse events in patients prescribed long-term opioids: A cohort study in the [place] Clinical Practice Research [employer]. Eur J Pain. 2019 May;23(5):908-922. doi: 10.1002/ejp.1357. Epub 2019 Jan 31. PMID: 69379883. Colameco S, Coren JS, Ciervo CA. Continuous opioid treatment for chronic noncancer pain: a time for moderation in prescribing. Postgrad Med. 2009 Jul;121(4):61-6. doi: 10.3810/pgm.2009.07.2032. PMID: 80358728. Gigi JONELLE Shlomo MILUS Levern IVER Conny RN, Marquez SD, Blazina I, Lonell DASEN, Bougatsos C, Deyo RA. The effectiveness and risks of long-term opioid therapy for chronic pain: a systematic review for a [employer] of [facility]ways to [employer]. Ann Intern Med. 2015 Feb 17;162(4):276-86. doi: 10.7326/M14-2559. PMID: 74418742. Rory CHRISTELLA Laurence [facility], [place] DM. NCHS Data Brief No. 22. [place]: Centers for Disease Control and Prevention; 2009. Sep, Increase in Fatal Poisonings Involving Opioid Analgesics in the [place] , 1999-2006. Song IA, Choi HR, Oh TK. Long-term opioid use and mortality in patients with chronic non-cancer pain: Ten-year follow-up study in [place] from 2010 through 2019. EClinicalMedicine. 2022 Jul 18;51:101558. doi: 10.1016/j.eclinm.2022.898441. PMID: 64124182; PMCID: [id number]. Huser, W., Schubert, T., Vogelmann, T. et al. All-cause mortality in patients with long-term opioid therapy compared with non-opioid analgesics for chronic non-cancer pain: a database study. BMC Med 18, 162 (2020). [email or web address].info/ Rashidian H, Zendehdel K, Kamangar F, Malekzadeh R, Haghdoost AA. An Ecological Study of the Association between Opiate Use and Incidence of Cancers. Addict Health. 2016 Fall;8(4):252-260. PMID: 71180443; PMCID: [id number].  Our Goal: Our goal is to control your pain with means other  than the use of opioid pain medications.  Our Recommendation: Talk to your physician about coming off of these medications. We can assist you with the tapering down and stopping these medicines. Based on the new information, even if you cannot completely stop the medication, a decrease in the dose may be associated with a lesser risk. Ask for other means of controlling the pain. Decrease or eliminate those factors that significantly contribute to your pain such as smoking, obesity, and a diet heavily tilted towards inflammatory nutrients.  Last Updated: 06/16/2023   ______________________________________________________________________       ______________________________________________________________________    Update on Controlled Substance (Opioid) Regulations   To: All patients taking opioid pain medications. (I.e.: hydrocodone , hydromorphone , oxycodone , oxymorphone, morphine, codeine, methadone, tapentadol, tramadol , buprenorphine, fentanyl , etc.)  Re: Review on the state of controlled substance regulations.  Introduction: Rules and regulations associated with all aspects of controlled substances are constantly being modified. Unfortunately we have encountered patients questioning the veracity of the information  that we provide them about these changes. This is intended to provide them with appropriate references and a historical review of these changes.  A Brief History: As of September 23, 2016, the [place]  Government declared the opioid epidemic a public health emergency. Prescription drug monitoring programs (PDMPs) and the [facility] All Schedules Prescription Electronic Reporting Act (NASPER). Before 1800, clinicians regarded pain as an existential phenomenon, a consequence of aging. There was no regulation on the use of cocaine and opioids, resulting in widespread marketing and prescribing for many ailments ranging from diarrhea to toothache. The [employer] of  [phone], passed in response to the sudden emergence of street heroin abuse as well as iatrogenic morphine dependence, influenced both physician and patient alike to avoid opiates. Patients with unexplained pain in the 1920s were regarded as deluded, malingering, or abusers, and cancer patients through the 1950s were encouraged to wean themselves off opioids until their lives "could be measured in weeks". Alongside this opioid evolution, the American Pain Society launched their influential "pain as the fifth vital sign" campaign in 1995. Concurrently, pharmaceutical companies introduced new formulations, such as extended release oxycodone  (OxyContin ). From 1997 to 2002, OxyContin  prescriptions increased from 670,000 to 6.2 million. However, concerns soon began to surface regarding overzealous opioid treatment. It must be noted that pharmaceutical companies contributed significantly to the rise of the opioid epidemic, receiving considerable reprimands as a consequence. In 2007, as the opioid epidemic began to inflict profound damage, [employer] pleaded guilty to federal charges related to the misbranding of OxyContin . [employer] agreed to pay a total of $634.5 million to resolve Justice Department investigations, as well as a $19.5 million settlement to [address] and the [address] of [place].  In response to the current epidemic, changes in focus to the development of new abuse deterrent opioid formulations at the [place]  Food and Drug Administration (FDA) as well as drafting of new public standards for pain treatment were created at [facility] in 2017. In response to the opioid epidemic, FDA public policy changes were announced in February 2016. Among these new positions were a re-examination of the risk-benefit paradigm for opioids with strict emphasis on the large public health ramifications. The various modified opioids released over the past 20 years, such as tamper-resistant preparation, have had differing levels of success,  and are collectively referred to as Risk Evaluation and Mitigation Strategies (REMS). There is also a growing focus on preventing opioid use disorder (OUD) and on offering affected individuals accessible and effective treatment. [place]  government policy reflects these changes and both the Affordable Care Act and the Mental Health Parity and Addiction Equity Act were major steps forward in treating opioid addiction. The Affordable Care Act, which was signed into law in 2010, with major provisions coming into effect by 2014.  In the 1990s, the intensified marketing of newly reformulated prescription opioid medications (e.g., OxyContin ) and an influential pain advocacy campaign that encouraged greater pain management led to a precipitous rise in opioid use in the [place] . Research from the Centers for Disease Control and Prevention (CDC) shows that prescription opioid sales in the [place]  quadrupled from 1999 to 2010. At the same time, opioid misuse and opioid-involved overdose deaths increased (Figure 1). Between 1999 and 2010, the rate of opioidinvolved overdose deaths in the [place]  doubled from 2.9 to 6.8 deaths per 100,000 people. This initial rise in opioid-related deaths is often referred to as the first wave of the recent opioid crisis.  Between 1999 and 2020, 565,000  Americans died of opioid-involved overdoses. In turn, federal, state, and local governments responded with various legal and policy efforts to curb opioid misuse and drug-related overdose Deaths.  Recent Congresses have enacted several laws addressing the opioid crisis, such as the Comprehensive Addiction and Recovery Act of 2016 (CARA, P.L. [phone]); the 21st Century Cures Act (P.L. 114-255); the Substance UseDisorder Prevention that Promotes Opioid Recovery and Treatment for Patients and Communities Act (SUPPORT Act, P.L. [phone]); the Fentanyl  Sanctions Act (Title LXXII of P.L. [id number]); and the Blocking  Deadly Fentanyl  Imports Act (P.L. 117-81, 6610). These laws addressed overprescribing and misuse of opioids, expanded substance use disorder prevention and treatment capacities, bolstered drug diversion capabilities, and enhanced international drug interdiction, counternarcotics cooperation, and sanctions efforts. Congress also directed additional funds to many of these initiatives through appropriations.  Congress provided funding in the [employer] Act of 2021 [phone]; P.L. 117-2) for syringe services programs (often known as needle exchange programs) and other harm reduction initiatives. Federal and state harm reduction strategies have frequently involved the distribution of naloxone  (e.g., Narcan )--a medication used to reverse an opioid overdose--and test strips used to detect fentanyl  in drug samples.  The Department of Justice (DOJ) and Department of Homeland Security [facility]) aim to reduce the diversion of prescription opioids and the use, manufacturing, and trafficking of illicit opioids. DOJ--via the Drug Enforcement Administration (DEA)--regulates opioid manufacturers, distributors, and dispensers; it also controls the opioid supply through enforcement of regulatory requirements.  A History of Opiate Laws in the [place]   Prior to 1890, laws concerning opiates were strictly imposed on a local city or state-by-state basis. One of the first was in [place] in 1875 where it became illegal to smoke opium only in opium dens. It did not ban the sale, import or use otherwise. In the next 25 years different states enacted opium laws ranging from outlawing opium dens altogether to making possession of opium, morphine and heroin without a physician's prescription illegal.  The first Congressional Act took place in 1890 that levied taxes on morphine and opium. From that time on the [employer] has had a series of laws and acts directly aimed at opiate use, abuse  and control. These are outlined below:  1906 - Pure Food and Drug Act Preventing the manufacture, sale, or transportation of adulterated or misbranded or poisonous or deleterious foods, drugs, medicines, and liquors, and for regulating traffic therein, and for other purposes. Punishment included fines and prison time.  1909   - Smoking Opium Exclusion Act Banned the importation, possession and use of smoking opium. Did not regulate opium-based medications. First Freight forwarder banning the non-medical use of a substance.  1914  - The Margrette Act In summary, The Margrette Act of 1914 was written more to have all parties involved in importing, exporting, set designer and distributing opium or cocaine to register with the [employer] and have taxes levied upon them. Exempt from the law were physicians operating "in the course of his professional practice"  1919 - Supreme Court ratified the Bj's in [place] et al., v. [place]  and [place]  v. [place], then again in [facility] v. [place] , in 1920, holding that doctors may not prescribe maintenance supplies of narcotics to people addicted to narcotics. However, it does not prohibit doctors from prescribing narcotics to wean a patient off of the drug. It was also the opinion of the court that prescribing narcotics to habitual users was not considered "professional practice" hence it  then was considered illegal for doctors to prescribe opioids for the purposes of maintaining an addiction. It can be argued that today's addiction medications are not intended to maintain an addiction but to facilitate addiction remission. In which case, this opinion of the court should not preclude practitioners from prescribing buprenorphine or methadone to patients suffering from an addictive disorder.  1924  - Heroin Act Architectural technologist, importation and possession of heroin illegal - even for medicinal use.  1922 -- Narcotic  Drug Import and Export Act Enacted to assure proper control of importation, sale, possession, production and consumption of narcotics.  1927  -- Special Educational Needs Teacher of [employer] of Prohibition was responsible for tracking bootleggers and organized conservation officer, historic buildings. They focused primarily on interstate and international cases and those cases where local law enforcement official would not or could not act.  1932 -- Uniform State Narcotic Act Encouraged states to pass uniform state laws matching the federal Narcotic Drug Import and Export Act. Suggested prohibiting cannabis use at the state level.  47 -- Food, Drug, and Cosmetic Act The new law brought cosmetics and medical devices under control, and it required that drugs be labeled with adequate directions for safe use. Moreover, it mandated pre-market approval of all new drugs, such that a manufacturer would have to prove to FDA that a drug were safe before it could be sold  1951 -- Boggs Act Imposed maximum criminal penalties for violations of the import/export and internal revenue laws related to drugs and also established mandatory minimum prison sentences.  1956 -- Narcotics Control Act Increased Boggs Act penalties and mandatory prison sentence minimums for violations of existing drug laws.  1965 -- Drug Abuse Control Amendment Enacted to deal with problems caused by abuse of depressants, stimulants and hallucinogens. Restricted research into psychoactive drugs such as LSD by requiring FDA approval.  1970 -- Controlled Substance Act  Controlled Substances Import and Export Act These laws are a consolidation of numerous laws regulating the manufacture and distribution of narcotics, stimulants, depressants, hallucinogens, anabolic steroids, and chemicals used in the illicit production of controlled substances. The CSA places all substances that are regulated under existing federal law into one of five schedules. This placement is based upon  the substance's medicinal value, harmfulness, and potential for abuse or addiction. Schedule I is reserved for the most dangerous drugs that have no recognized medical use, while Schedule V is the classification used for the least dangerous drugs. The act also provides a mechanism for substances to be controlled, added to a schedule, decontrolled, removed from control, rescheduled, or transferred from one schedule to another.  38 - Drug Enforcement Agency By Executive Order, the DEA was formed to take place of the [employer] of Narcotics and Dangerous Drugs.  97 -Narcotic Addict Treatment Act of  1974  - Public Law [phone] Amends the Controlled Substance Act of 1970 to provide for the registration of practitioners conducting narcotic treatment programs. [methadone clinics] It also provides legal definitions for the phrases "maintenance treatment" and "detoxification treatment".  1986 -- Anti-Drug Abuse Act of 1986 Strengthened Federal efforts to encourage foreign cooperation in eradicating illicit drug crops and in halting international drug traffic, to improve enforcement of Federal drug laws and enhance interdiction of illicit drug shipments, to provide strong Market researcher in establishing effective drug abuse prevention and education programs, to [employer] support for drug abuse treatment and rehabilitation efforts, and for other purposes. It also re-imposed mandatory sentencing minimums depending on which drug and how  much was involved.  1988 -- Anti-Drug Abuse Act of 1988 Established the Office of Materials Engineer (ONDCP) in the [employer] of the Economist; authorized funds for [employer], state and local drug enforcement activities, school-based drug prevention efforts, and drug abuse treatment with special emphasis on injecting drug abusers at high risk for AIDS.  2000 -- Federal - The Drug Addiction Treatment Act of 2000 (DATA 2000) It enables qualified physicians to  prescribe and/or dispense narcotics for the purpose of treating opioid dependency. For the first time, physicians are able to treat this disease from their private offices or other clinical settings. This presents a very desirable treatment option for those who are unwilling or unable to seek help in drug treatment clinics. Patients can now be treated in the privacy of their doctor's office, as are other people being treated for any other type of medical condition. One medicine doctors may now prescribe is Buprenorphine. The major downfall of this Act is the limitation of 30 patients per practice - which means that large facilities, no matter how many physicians are there, can only treat 30 patients at a time.  2002-- DEA reschedules buprenorphine from a schedule V drug to a schedule III drug, on September 14, 2001 - the day before the FDA approval of Suboxone and Subutex despite overwhelming objection by the medical community.  2004: June 2004 THE CONFIDENTIALITY OF ALCOHOL  AND DRUG ABUSE PATIENT RECORDS REGULATION AND THE HIPAA PRIVACY RULE:  Confidentiality of Alcohol  and Drug Dependence Patient Records (summary) Code of Federal Regulations Title 42 Part 2 (42 CFR Part 2)  The confidentiality of alcohol  and drug dependence patient records maintained by this practice/program is protected by federal law and regulations. Generally, the practice/program may not say to a person outside the practice/program that a patient attends the practice/program, or disclose any information identifying a patient as being alcohol  or drug dependent unless:  The patient consents in writing; The disclosure is allowed by a court order, or The disclosure is made to medical personnel in a medical emergency or to qualified personnel for research,  audit, or practice/program evaluation. Violation of the federal law and regulations by a practice/program is a crime. Suspected violations may be reported to appropriate authorities  in accordance with federal regulations. Freight forwarder and regulations do not protect any information about a crime committed by a patient either at the practice/program or against any person who works for the practice/program or about any threat to commit such a crime. Federal laws and regulations do not protect any information about suspected child abuse or neglect from being reported under state law to appropriate state or local authorities.  sample consent form (MS-WORD)  2005: 07-10-2004 Public law [phone], Amends the Controlled Substances Act to eliminate the 30-patient limit for medical group practices allowed to dispense narcotic drugs in schedules III, IV, or V for maintenance or detoxification treatment (retains the 30-patient limit for an individual physician). This amendment removes the 30-patient limit on group medical practices that treat opioid dependence with buprenorphine. The restriction was part of the original Drug Addiction Treatment Act of 2000 (DATA) that allowed treatment of opioid dependence in a doctor's office. With this change, every certified doctor may now prescribe buprenorphine up to his or her individual physician limit of 30 patients.  2006: On 12/06/2005 President Levy signed Bill H.R.6344 into law. This allows physicians who have been certified to prescribe certain drugs for the treatment of opioid dependence under DATA2000 to treat up to  100 patients (up from 30) by submitting an intent notification to the Dept of Health and [employer]. This is a major step forward in both fighting the stigma and allowing access to treatment previously not available to some. For more details see 30/100-PATIENT LIMIT  2016: HHS augments regulations concerning the 30/100 patient limit by raising the limit to 275 for qualifying physicians. Link to summary of regulation  2016: Comprehensive Addiction and Recovery Act of 2016 (sec.303) amends the Controlled Substance Act - to allow Nurse  Practitioners and Physician Assistants to become eligible to prescribe buprenorphine for the treatment of opioid use disorder. See the entire law for more details.  The roots of the concurrent regulation of certain drugs under two statutory schemes go back to the beginning of this century. In 1906, Congress enacted the Pure Food and Drug Act, establishing one regime of regulation to assure (among other things) that drugs were not adulterated or misbranded. These regulations were amended several times, recodified in 1938, and expanded on again from the 1940s through the 1990s. Their implementation and enforcement is today assigned to the Food and Drug Administration (FDA) in the Department of Health and Human Services [facility]).  In 1914, Congress adopted the [place] Narcotic Act to stop abuse of addictive drugs. The [place] Narcotic Act was amended in 1937 to include marijuana. In 1965, amphetamines, barbiturates, and hallucinogens came under regulation, but under the [employer], Drug, and Cosmetic Act. In 1970, these various statutes were consolidated and recodified as the Controlled Substances Act (CSA), which has been amended several times since then. Its implementation and enforcement is today assigned to the Drug Enforcement Administration (DEA) in the Department of Justice.  The first clash occurred after World War I, when so-called morphine clinics existed and physicians prescribed or dispensed morphine to addicts. Some addicts were veterans of the American Civil War, the Spanish-American War, and WWI, who had become addicted during treatment for war wounds, but most of them came from the growing population of nonmedical addicts (Courtwright, 8017). The Narcotics Division of the Prohibition Unit of the Department of the Treasury, which was then responsible for enforcing the [facility] Narcotic Act, concluded that this activity was not the legitimate practice of medicine but simple drug trafficking. The  Treasury Department swiftly closed the clinics and made it personally and professionally risky for physicians to maintain a narcotic addict for any reason. In did so, however, only after the American Medical Association had adopted a resolution, in 1920, opposing ambulatory clinics''.  In 1972, the public health establishment, including the Secretary of Health, Education, and Welfare, the Education Officer, Environmental, the [employer] of [employer], and the Chemical Engineer for Drug Abuse Prevention, was unprepared to allow [employer] of Narcotics and Dangerous Drugs, DEA's predecessor agency, to unilaterally define the parameters of medical practice for the use of methadone in the treatment of heroin addiction. As a consequence, a new set of rules--the third, on top of the FDA and DEA schemes--was added, one that inserted FDA deeply into the practice of medicine, notwithstanding its protestations to the contrary. Congress ratified this joint responsibility of law enforcement and public health officials for methadone through this third set of rules in 1974 with the passage of the Narcotic Addict Treatment Act (NATA). To examine in detail the evolution of this third set of rules--commonly referred to as the FDA or DHHS methadone regulations--we turn, first, to the period of the mid-1960s.  Increased use of heroin in the  post World War II period first became apparent in the early to mid 1950s. During the [employer], a minimum mandatory narcotics law was enacted in 1956, effective July 1957. 1962 [facility] conference on drug abuse, the [employer] on Narcotic and Drug Abuse (the [employer]) of 1963, the Drug Abuse Control Amendments of 1965, the President's Commission on [employer] and Administration of Justice (the [employer]) of [phone], and the Narcotic Addiction Rehabilitation Act of 1966.  The 1965 Drug Abuse Control Amendments  brought under strict federal control all nonnarcotic drugs capable of producing serious psychotoxic effects when abused. This act also created the [employer] of Drug Abuse Control within the Department of Health, Education, and Welfare (DHEW) and shifted the basis for aon corporation of illegal drugs from tax principles (administered by the Department of Treasury) to the regulation of commerce (administered by the [employer]ATION).  The 1966 Narcotic Addiction Rehabilitation Act TOUR MANAGER) authorized the civil commitment of narcotic addicts, and federal assistance to state and local governments to develop a local system of drug treatment programs. With respect to the latter, the [employer] Mills of Mental Health [facility]) initially proposed the gradual implementation of the state assistance effort, mainly through a common mental health mechanism--inpatient treatment programs. However, because of a perceived pressing need, the courts began to commit addicts to these programs even before they were officially opened or staffed. The NARA legislation imposed the following contract requirements on treatment centers: (1) thrice-a-week counseling sessions; (2) weekly urine tests; (3) restorative dental services; (4) psychological consultations and vocational training; and (5) the treatment modalities of drug-free outpatient, therapeutic community, and methadone maintenance. Reorganization Plan No. 1 of 1968 transferred the primary functions of the [employer] of Narcotics (FBN) from the [employer] to the Department of Justice; it also transferred the [employer] of Drug Abuse Control functions to the Department of Justice. Within the [employer], the [employer] of Narcotics and Dangerous Drugs (BNDD) was created, which became the Drug Enforcement Administration in 1973.   Under the first [place] administration [phone]), federal drug abuse policy developed in a significant way. These developments included a 1969 war  on drugs presidential message, resulting legislation in 1970, and a Special Action Office created by executive order in 1971 and authorized in statute in 1972. Brynn, in 1969, to send a message to Congress on drug abuse. Although this was the first time that a U.S. president invoked the war on drugs image, it was in retrospect the most balanced approach to the problem of drug abuse that had been advanced. The 1969 message resulted in the submission of legislation to the Congress and the passage, the following year, of the Comprehensive Drug Abuse Prevention and Control Act of 1970 [employer] [phone], October 04, 1969). The act dealt with research, treatment, and prevention of drug abuse and drug dependence, and with drug abuse charity fundraiser. One major purpose of the 1970 legislation was to reverse some of the strictures of the [employer] of 1914. The 1970 act sought to clarify for the medical profession . . . the extent to which they may safely go in treating narcotic addicts as patients. Title I, in Section IV, charged the Surveyor, Minerals, Education, and Welfare, to determine the appropriate methods of professional practice in the medical treatment of the narcotic addiction of various classes of narcotic addicts. This provision constitutes the initial statutory basis for treatment standards. The law enforcement sections consolidated all prior federal statutes into the  Controlled Substances Act and the Controlled Substances Export and Import Act (Titles II and III, respectively, of the Comprehensive Drug Abuse Prevention and Control Act of 1970). Under this legislation, substances were classified under five schedules according to their abuse potential, and psychological and physical effects. Methadone was placed in Schedule II, along with such opiate drugs as morphine, codeine, and hydrocodone .  One of the most important steps taken by President Brynn was to establish in June 1971 the  Special Action Office for Drug Abuse Prevention (SAODAP) in the [employer] of the President ([place] [phone], May 25, 1970). In mid-1971, [facility] appointed Dr. Maple Dunnings as SAODAP director. Within a year, the Drug Abuse Prevention Office and Treatment Act of 1972 [employer] [phone], February 27, 1971) gave statutory authority to [facility], but limiting setting, on June 07, 1974, as the limit on its existence.  The purpose of the 1972 act was to bring the resources of the federal government to bear on drug abuse with the immediate objective of significantly reducing its incidence and developing a comprehensive, coordinated long-term federal strategy to combat drug abuse.  Narcotic Addict Treatment Act [employer]) of 1974 [employer] [phone]), which amended the Controlled Substances Act. This legislation was driven by concern for the diversion of methadone to illicit channels that was occurring in 1972 and 1973, as reflected in the title of the Senate bill adopted on May 16, 1972, the Methadone Diversion Control Act of 1973. (U.S. Senate, 1970a, 8029a).  The 1980 final rule (45 FR 37305, August 28, 1979) reduced the minimum standard for admission from two years of addiction to one year coupled with a clinical determination that the individual was currently physiologically.  The regulations were next revised in 1989, following two proposals to modify them, one in 1983 and one in 1987.  Under President Tanda Corrente, a government-wide effort was made to review all federal government regulations and to eliminate or reduce the burden of these regulations on the private sector, state and nash-finch company, and wps resources.   The 1983 recommendations, though not adopted, did initiate another revision of the methadone regulations, which first found expression in a 1987 proposed rule (52 FR 37047, September 09, 1986) and culminated in a final rule (54 FR 8954, February 08, 1988) at the end of the  decade. In the 1987 proposed rule, the FDA and NIDA, in an effort to put the best face on the unenthusiastic 1983 response by the provider community to converting the regulations to guidelines, indicated that they had retained the current requirements necessary to achieve the goals of the 1974 NATA, but were proposing to streamline the regulation and to promote more efficient operation of methadone programs. The 1987 proposed rule, issued by the FDA and NIDA, advanced the following changes in the methadone regulation: that detoxification treatment be divided into short-term (<21 days) and long-term (>21 and <180 days) treatment; that the minimum staffing ratio of one counselor to 50 patients be eliminated; that blood tests be allowed as ways to conduct initial drug screening or to meet the monthly testing requirements for six-day take-home patients; that the 72-hour notification of FDA and the pertinent state authority for methadone doses greater than 100 mg be eliminated; that special adverse reaction reporting requirements for methadone be eliminated and reliance placed upon general FDA reporting requirements; that a supervising counselor be allowed to conduct the annual review of the patient's treatment plan for certain qualified patients who had been in treatment for 3 years  or longer; and that the requirement of an annual report of methadone treatment programs to the FDA be dropped. The FDA and NIDA issued a final rule on February 08, 1988, based on comments on the 1987 proposed (54 FR 8954). Concurrently, FDA and NIDA issued a six-page guidance document, which noted that the regulations, over time, had recommended certain practices that were not actually required. Public Health Service, in Congress, and elsewhere, to reorganize the Alcohol , Drug Abuse, and Mental Health Administration (ADAMHA). These efforts culminated in the [employer] of 1992 [employer] [phone], June 18, 1991), the main  purpose of which was to transfer the research portions of the three ADAMHA institutes--NIDA, the [employer] Mills of Alcoholism and Alcohol  Abuse, and the [employer] of Mental Health--to the [employer] and to create the Substance Abuse and Mental Health Services Administration [facility]) as the home for the service functions of these entitles.  Guidelines for Opioid Treatment The Federal Guidelines for Opioid Treatment Programs - 2015 serve as a guide to accrediting organizations for developing accreditation standards. The guidelines also provide OTPs with information on how programs can achieve and maintain compliance with federal regulations. The 2015 guidelines are an update to the 2007 Guidelines for the Accreditation of Opioid Treatment Programs (PDF  547 KB). The new document reflects the obligation of OTPs to deliver care consistent with the patient-centered, integrated, and recovery-oriented standards of substance use treatment.  DPT oversees the certification of OTPs and provides guidance to nonprofit organizations and state governmental entities that want to become a SAMHSA-approved accrediting body. Learn more about the accreditation and certification of OTPs and [employer] oversight of OTP accreditation bodies.  Model Guidelines for [facility] Boards With input from [facility], the Federation of [employer] in 2013 adopted a revised version of the federation's office-based opioid treatment policies. The Model Policy on DATA 2000 and Treatment of Opioid Addiction in the Medical Office - 2013 (PDF  279 KB) provides model guidelines for use by state medical boards in regulating office-based opioid treatment.  Holiday Guidance for Opioid Treatment Programs (PDF  203 KB) In response to requests for the upcoming federal holidays and ensuing weekends (December 24th, 25th, and 26th and December 31st, Jan 1st, and Jan 2nd), this letter is to provide guidance  regarding requests for unsupervised doses of medication for patients for these dates. View a sample [id number] (PDF  194 KB).  Federal regulation of drugs emerged as early as 70, under a law that addressed only imported drugs. In 1905 the [employer] launched a private, voluntary means of controlling a substantial part of the drug marketplace, a system that remained in place for over a half-century. Drug regulation in FDA has evolved considerably since President Ricardo Para signed the 1906 Pure Food and Drugs Act.  1820 Eleven doctors set up the U.S. Pharmacopeia and record the first list of standard drugs. 1848 Drug Importation Act passed by Congress requires U.S. Customs Service inspection to stop entry of tainted, low quality drugs from overseas. 8116 Dr. Mitchell MICAEL Burrs becomes the chief chemist at the [facility] [place] adulteration studies.  1905 The American Medical Association [facility]) begins a voluntary program of drug approval that would last until 1955. In order to advertise in the [facility] and related journals, drug companies must show proof that the drug will treat what they claim. 1906 The original Food and Drug Act is passed by Congress on June 30 and signed by [employer].  The Act outlaws states from buying and selling food, drinks, and drugs that have been mislabeled and tainted. 1911 In U.S. v. Vicci, the [employer] that the [employer] and Drugs Act does not outlaw false medical claims but only false and misleading statements about the ingredients or identity of a drug. 1912 Congress passes the [place] Amendment to overcome the ruling in U.S. v. Vicci. The Act outlaws labeling medicines with fake medical claims that is meant to trick the buyer. 1930 The name of the Food, Drug, and Insecticide Administration is shortened to Food and Drug Administration (FDA) under an therapist, music. 1933 FDA  recommends a total rewrite of the out-of-date 1906 Food and Drugs Act.   1937 Elixir Sulfanilamide, contain the poisonous liquid, diethylene glycol, kills 107 persons, many of whom are children, dramatizing the need to establish drug safety before marketing and to pass the pending food and drug law. 1938 Congress passes [employer], Drug, and Cosmetic (FDC) Act of 1938, which requires that new drugs show safety before selling. This starts a new system of drug regulation. The Act also requires that safe limits be set for unavoidable poisonous matter and allows for factory inspections. The Directv is given power to oversee advertising for all FDAregulated products except prescription drugs. FDA states that sulfanilamide and other dangerous drugs must be given under the direction of a medical expert. This begins the requirement for prescription only (nonnarcotic) drugs (see 1951 Benedict-Humphrey amendment). 1941 Nearly 300 deaths and injuries result from the use of sulfathiazole tablets, an antibiotic, tainted with the sedative, phenobarbital. In response, FDA drastically changes manufacturing and quality controls. These changes lead to the development of good manufacturing practices (GMPs). 1948 The [employer] in U.S. v. Floretta that FDA jurisdiction extends to retail stores, thereby allowing FDA to stop illegal sales of drugs by pharmacies including barbiturates and amphetamines. 1950 In Walgreen. v. U.S., a U.S. Court of Appeals rules that the directions for use on a drug label must include the drug's purpose. 1951 Congress passes the -Humphrey Amendment, which defines the kinds of drugs that cannot be used safely without medical supervision. The amendment limits sale of these drugs to prescription only by a medical professional. All other drugs are to be available without a prescription. 1952 A nationwide investigation by FDA  reveals that chloramphenicol, an antibiotic, caused nearly 180 cases of often deadly blood diseases. Two years later FDA engages the [employer] of Hospital Pharmacists, the American Association of Medical Record Librarians, and later the American Medical Association in a voluntary program of drug reaction reporting. 1953 The [employer] Amendment clarifies previous law and requires FDA to give manufacturers written reports of conditions seen during inspections and results of factory samples. 1962 Thalidomide, a new sleeping pill, causes severe birth defects of the arms and legs in thousands of babies born in [place]. The U.S. media reports on how Dr. Cathlean Mort, a FDA medical officer, helped prevent approval and marketing of Thalidomide in the [place] . These reports stirred up public support for stronger drug laws. 3 Congress passes the [employer]. For the first time, these laws require drug makers to prove their drug works before FDA can approve them for sale. The Advisory Committee on Investigational Drugs meets for the first time. This was the first meeting of a committee to advise FDA on product approval and policy on an ongoing basis. 1966 FDA contracts with the [employer]  of [employer] to measure the effectiveness of 4,000 marketed drugs approved on the basis of safety alone between [phone] and 11-22-1961. The Fair Packaging and Labeling Act requires all consumer products, in interstate commerce, to be honestly and informatively labeled. 23-Nov-1967 FDA forms the Drug Efficacy Study Implementation (DESI) to carry out recommendations of the [employer] of the effectiveness of drugs first sold between [place] and 1962/12/15December 15, 1970 FDA requires the first patient package insert, medicines must come with information for the patient about risks and benefits. 1972 Over-the-Counter Drug Review begins  to enhance the safety, effectiveness and appropriate labeling of drugs sold without prescription. 1973 The U.S. Supreme Court upholds the [place] drug effectiveness law and approves FDA's action to control entire classes of products. 1982 FDA issues Tamper-resistant Packaging Regulations to prevent poisonings such as deaths from cyanide placed in Tylenol  capsules. Congress passes the [employer] in 11-22-1982, making it a crime to tamper with packaged consumer products. 11/23/1983 Drug Price Advertising Account Planner Act (Hatch-Waxman Act) increases the availability of less costly generic drugs by allowing FDA to approve applications for generic versions of brand-name drugs without repeating the research that proved the safety and effectiveness of the brand-name drugs. The Act also allowed brand-name companies to apply for up to five years additional patent protection for the new medicines they developed to make up for time lost while their products were going through FDA's approval process. 1989 The FDA issued guidelines asking drug makers to decide if a drug is likely to have usefulness in elderly people and to include elderly people in studies when applicable. 1991 In 1980/11/22, the FDA and the Department of Health and Human Services published a policy on protecting people in research. In November 22, 1990, this policy is adopted by more than a dozen federal agencies involved in human subject research and becomes known as the Common Rule. 4 1993 FDA launches MedWatch, a system designed to collect reports from health professionals on problems with drugs and other medical products. FDA issues guidelines for measuring gender differences in responses to medication. Drug companies are encouraged to include patients of both sexes in their research of drugs and to study any gender-specific effects. 1995 FDA declares cigarettes to be drug delivery devices. Limits are issued on marketing and  sales to reduce smoking by young people. 1998 FDA introduces the Adverse Event Reporting System (AERS), a computerized database designed to store and study safety reports on already marketed drugs.  The Demographic Rule requires that a marketing application review data on safety and effectiveness by age, gender, and race. The Pediatric Rule requires drug makers of selected new and existing drugs to conduct studies on drug safety and effectiveness in children. 1999 Creation of the Drug Facts Label for OTC drug products. The law requires all overthe-counter drug labels to have information in a standard format. These drug facts labels are designed to give the user easy-to-find information. 2000 The U. S. [employer], upholds an earlier decision from [employer] and Drug Administration v. [employer]. et al. and rules 5-4 that FDA does not have authority to regulate tobacco as a drug. 2001-11-22 The Best Pharmaceuticals for Children Act, in exchange for studying the drug in children, the drug maker gets six months of selling their product without competition. Nov 22, 2002 The Pediatric Research Equity Act gives FDA the right to ask drug companies to study the effectiveness of new drugs in children. 11-23-2003 FDA advises medical professionals to  limit the use of a pain reliever called Cox-2, a nonsteroidal anti-inflammatory drug (NSAIDs). Studies had shown that long-term use raised chances of heart attacks and strokes. The warning is also added to the over-thecounter NSAIDs' Drug Facts label. Medicines used in hospitals must have a bar code to prevent patients from receiving the wrong medicine. 5 2005 The Drug Safety Board is formed, consisting of FDA staff and representatives from the [employer] of [address] and the [employer]. The Board advises the Director, Center for Drug Evaluation and Research, FDA, on drug safety issues and works with the agency in sharing safety  information to health professionals and patients.  The [place]  Food and Drug Administration (FDA) was first created to enforce the Pure Food and Drug Act of 1906. In this capacity, the FDA is charged with protecting the health of the [place]  public, to ensure the quality of its food, medicine, and cosmetics. Before this time, the [place]  government had no formal oversight of these products and left issues of quality and purity to the individual manufactures, or at times, individual states.    Review: [place] Stop ACT. (The Strengthen Opioid Misuse Prevention (STOP) Act of 2017). GENERAL ASSEMBLY OF [place]  SESSION 2017 SESSION LAW 2017-74 HOUSE BILL 243  PMP mandatory The dispenser shall report: (1) The dispenser's DEA number. (2) The name of the patient for whom the controlled substance is being dispensed, and the patient's: a. Full address, including city, state, and zip code, b. Telephone number, and c. Date of birth. (3) The date the prescription was written. (4) The date the prescription was filled. (5) The prescription number. (6) Whether the prescription is new or a refill. (7) Metric quantity of the dispensed drug. (8) Estimated days of supply of dispensed drug, if provided to the dispenser. (9) National Drug Code of dispensed drug. (10) Prescriber's DEA number. (11) Method of payment for the prescription.  No paper prescriptions  Duration of scripts Acute vs Chronic prescribing  2016 CDC Guidelines for prescribing Opioids for Chronic Pain. (Updated in 2022.) Medical Board  Laws:  Prescription Laws Drug laws, rules, and regulations are constantly changing. Any attempt to summarize them would quickly become outdated. Because of that, the Board encourages practitioners who seek guidance on prescribing procedures to refer to the sources listed below in addition to the Board's position statements, rules and Medical Practice Act.  [place]  Board of Pharmacy  (NCBOP) (which offers the state's pharmacy laws and rules, and links to the Code of Federal Regulations) [employer] Site: www.ncbop.org  [place]  General Statutes General Web Site: politicalpool.cz See: [employer], Drug, and Cosmetic Act: [id number] & 106-134 See: [place]  Pharmacy Practice Act, Article 4A: [phone] See:   Controlled Substances Act, Article 5: 90-86 & 90-113.8 See: Use of controlled substances to render one mentally incapacitated or physically helpless: [facility] [facility]. Code, Title 21, Food & Drugs www.deadiversion.usdoj.gov Controlled Substances Schedules www.deadiversion.usdoj.gov Drug Warehouse Manager - www.deadiversion.usdoj.gov 42 CFR  8.12 - Federal opioid treatment standards.   Effective August 04, 2016, prior approval will be required for opioid analgesic doses for [facility]. Medicaid and [place] [facility]) beneficiaries which:  Exceed 120 mg of morphine equivalents (MME) per day  Are greater than a 14-day supply of any opioid, or,  Are non-preferred opioid products on the [place] Medicaid Preferred Drug List (PDL)  FEDERAL 42 CFR  8.12 - Federal opioid treatment standards. Title II of the Comprehensive Drug Abuse  Prevention and Control Act of 1970, commonly known as the Controlled Substance Act (CSA) Title 21 [place]  Code ([place]) Controlled Substances Act.   Reference:   ______________________________________________________________________       ______________________________________________________________________    Medication Rules  Purpose: To inform patients, and their family members, of our medication rules and regulations.  Applies to: All patients receiving prescriptions from our practice (written or electronic).  Pharmacy of record: This is the pharmacy where your electronic prescriptions will be sent. Make sure we have the correct one.  Electronic  prescriptions: In compliance with the [place]  Strengthen Opioid Misuse Prevention (STOP) Act of 2017 (Session Law 2017-74/H243), effective December 09, 2018, all controlled substances must be electronically prescribed. Written prescriptions, faxing, or calling prescriptions to a pharmacy will no longer be done.  Prescription refills: These will be provided only during in-person appointments. No medications will be renewed without a face-to-face evaluation with your provider. Applies to all prescriptions.  NOTE: The following applies primarily to controlled substances (Opioid* Pain Medications).   Type of encounter (visit): For patients receiving controlled substances, face-to-face visits are required. (Not an option and not up to the patient.)  Patient's Responsibilities: Pain Pills: Bring all pain pills to every appointment (except for procedure appointments). Pill counts are required.  Pill Bottles: Bring pills in original pharmacy bottle. Bring bottle, even if empty. Always bring the bottle of the most recent fill.  Medication refills: You are responsible for knowing and keeping track of what medications you are taking and when is it that you will need a refill. The day before your appointment: write a list of all prescriptions that need to be refilled. The day of the appointment: give the list to the admitting nurse. Prescriptions will be written only during appointments. No prescriptions will be written on procedure days. If you forget a medication: it will not be Called in, Faxed, or electronically sent. You will need to get another appointment to get these prescribed. No early refills. Do not call asking to have your prescription filled early. Partial  or short prescriptions: Occasionally your pharmacy may not have enough pills to fill your prescription.  NEVER ACCEPT a partial fill or a prescription that is short of the total amount of pills that you were prescribed.  With  controlled substances the law allows 72 hours for the pharmacy to complete the prescription.  If the prescription is not completed within 72 hours, the pharmacist will require a new prescription to be written. This means that you will be short on your medicine and we WILL NOT send another prescription to complete your original prescription.  Instead, request the pharmacy to send a carrier to a nearby branch to get enough medication to provide you with your full prescription. Prescription Accuracy: You are responsible for carefully inspecting your prescriptions before leaving our office. Have the discharge nurse carefully go over each prescription with you, before taking them home. Make sure that your name is accurately spelled, that your address is correct. Check the name and dose of your medication to make sure it is accurate. Check the number of pills, and the written instructions to make sure they are clear and accurate. Make sure that you are given enough medication to last until your next medication refill appointment. Taking Medication: Take medication as prescribed. When it comes to controlled substances, taking less pills or less frequently than prescribed is permitted and encouraged. Never take more pills than instructed. Never take the medication more frequently than  prescribed.  Inform other Doctors: Always inform, all of your healthcare providers, of all the medications you take. Pain Medication from other Providers: You are not allowed to accept any additional pain medication from any other Doctor or Healthcare provider. There are two exceptions to this rule. (see below) In the event that you require additional pain medication, you are responsible for notifying us , as stated below. Cough Medicine: Often these contain an opioid, such as codeine or hydrocodone . Never accept or take cough medicine containing these opioids if you are already taking an opioid* medication. The combination may cause  respiratory failure and death. Medication Agreement: You are responsible for carefully reading and following our Medication Agreement. This must be signed before receiving any prescriptions from our practice. Safely store a copy of your signed Agreement. Violations to the Agreement will result in no further prescriptions. (Additional copies of our Medication Agreement are available upon request.) Laws, Rules, & Regulations: All patients are expected to follow all [address] and [employer], Rules,  [place]. Ignorance of the Laws does not constitute a valid excuse.  Illegal drugs and Controlled Substances: The use of illegal substances (including, but not limited to marijuana and its derivatives) and/or the illegal use of any controlled substances is strictly prohibited. Violation of this rule may result in the immediate and permanent discontinuation of any and all prescriptions being written by our practice. The use of any illegal substances is prohibited. Adopted CDC guidelines & recommendations: Target dosing levels will be at or below 60 MME/day. Use of benzodiazepines** is not recommended. Urine Drug testing: Patients taking controlled substances will be required to provide a urine sample upon request. Do not void before coming to your medication management appointments. Hold emptying your bladder until you are admitted. The admitting nurse will inform you if a sample is required. Our practice reserves the right to call you at any time to provide a sample. Once receiving the call, you have 24 hours to comply with request. Not providing a sample upon request may result in termination of medication therapy.  Exceptions: There are only two exceptions to the rule of not receiving pain medications from other Healthcare Providers. Exception #1 (Emergencies): In the event of an emergency (i.e.: accident requiring emergency care), you are allowed to receive additional pain medication. However, you are  responsible for: As soon as you are able, call our office [phone], at any time of the day or night, and leave a message stating your name, the date and nature of the emergency, and the name and dose of the medication prescribed. In the event that your call is answered by a member of our staff, make sure to document and save the date, time, and the name of the person that took your information.  Exception #2 (Planned Surgery): In the event that you are scheduled by another doctor or dentist to have any type of surgery or procedure, you are allowed (for a period no longer than 30 days), to receive additional pain medication, for the acute post-op pain. However, in this case, you are responsible for picking up a copy of our Post-op Pain Management for Surgeons handout, and giving it to your surgeon or dentist. This document is available at our office, and does not require an appointment to obtain it. Simply go to our office during business hours (Monday-Thursday from 8:00 AM to 4:00 PM) (Friday 8:00 AM to 12:00 Noon) or if you have a scheduled appointment with us , prior to your surgery,  and ask for it by name. In addition, you are responsible for: calling our office [phone], at any time of the day or night, and leaving a message stating your name, name of your surgeon, type of surgery, and date of procedure or surgery. Failure to comply with your responsibilities may result in termination of therapy involving the controlled substances.  Consequences:  Non-compliance with the above rules may result in permanent discontinuation of medication prescription therapy. All patients receiving any type of controlled substance is expected to comply with the above patient responsibilities. Not doing so may result in permanent discontinuation of medication prescription therapy. Medication Agreement Violation. Following the above rules, including your responsibilities will help you in avoiding a Medication  Agreement Violation ("Breaking your Pain Medication Contract").  *Opioid medications include: morphine, codeine, oxycodone , oxymorphone, hydrocodone , hydromorphone , meperidine , tramadol , tapentadol, buprenorphine, fentanyl , methadone. **Benzodiazepine medications include: diazepam (Valium), alprazolam (Xanax), clonazepam (Klonopine), lorazepam (Ativan), clorazepate (Tranxene), chlordiazepoxide (Librium), estazolam (Prosom), oxazepam (Serax), temazepam (Restoril), triazolam (Halcion) (Last updated: 10/01/2023) ______________________________________________________________________     ______________________________________________________________________    National Pain Medication Shortage  The [place] is experiencing worsening drug shortages. These have had a negative widespread effect on patient care and treatment. Not expected to improve any time soon. Predicted to last past 2029.   Drug shortage list (generic names) Oxycodone  IR Oxycodone /APAP Oxymorphone IR Hydromorphone  Hydrocodone /APAP Morphine  Where is the problem?  Manufacturing and supply level.  Will this shortage affect you?  Only if you take any of the above pain medications.  How? You may be unable to fill your prescription.  Your pharmacist may offer a partial fill of your prescription. (Warning: Do not accept partial fills.) Prescriptions partially filled cannot be transferred to another pharmacy. Read our Medication Rules and Regulation. Depending on how much medicine you are dependent on, you may experience withdrawals when unable to get the medication.  Recommendations: Consider ending your dependence on opioid pain medications. Ask your pain specialist to assist you with the process. Consider switching to a medication currently not in shortage, such as Buprenorphine. Talk to your pain specialist about this option. Consider decreasing your pain medication requirements by managing tolerance thru Drug Holidays.  This may help minimize withdrawals, should you run out of medicine. Control your pain thru the use of non-pharmacological interventional therapies.   Your prescriber: Prescribers cannot be blamed for shortages. Medication manufacturing and supply issues cannot be fixed by the prescriber.   NOTE: The prescriber is not responsible for supplying the medication, or solving supply issues. Work with your pharmacist to solve it. The patient is responsible for the decision to take or continue taking the medication and for identifying and securing a legal supply source. By law, supplying the medication is the job and responsibility of the pharmacy. The prescriber is responsible for the evaluation, monitoring, and prescribing of these medications.   Prescribers will NOT: Re-issue prescriptions that have been partially filled. Re-issue prescriptions already sent to a pharmacy.  Re-send prescriptions to a different pharmacy because yours did not have your medication. Ask pharmacist to order more medicine or transfer the prescription to another pharmacy. (Read below.)  New 2023 regulation: August 09, 2022 Revised Regulation Allows DEA-Registered Pharmacies to Transfer Electronic Prescriptions at a Patient's Request DEA Headquarters Division - Public Information Office Patients now have the ability to request their electronic prescription be transferred to another pharmacy without having to go back to their practitioner to initiate the request. This revised regulation went into effect on Monday, August 05, 2022.     At a patient's request, a DEA-registered retail pharmacy can now transfer an electronic prescription for a controlled substance (schedules II-V) to another DEA-registered retail pharmacy. Prior to this change, patients would have to go through their practitioner to cancel their prescription and have it re-issued to a different pharmacy. The process was taxing and time consuming for both patients  and practitioners.    The Drug Enforcement Administration [facility]) published its intent to revise the process for transferring electronic prescriptions on October 27, 2020.  The final rule was published in the federal register on July 04, 2022 and went into effect 30 days later.  Under the final rule, a prescription can only be transferred once between pharmacies, and only if allowed under existing state or other applicable law. The prescription must remain in its electronic form; may not be altered in any way; and the transfer must be communicated directly between two licensed pharmacists. It's important to note, any authorized refills transfer with the original prescription, which means the entire prescription will be filled at the same pharmacy.  Reference: hugehand.is [employer] website announcement)  Cheapwipes.[email or web address].pdf [employer])   [employer] / Vol. 88, No. 143 / Thursday, July 04, 2022 / Rules and Regulations DEPARTMENT OF JUSTICE  Drug Enforcement Administration  21 CFR Part 1306  [Docket No. [id number]]  RIN [id number] Transfer of Electronic Prescriptions for Schedules II-V Controlled Substances Between Pharmacies for Initial Filling  ______________________________________________________________________       ______________________________________________________________________    Medication Transfer   Notification You are currently compliant and stable on your pain medication regimen. This regimen will be transferred today to your Primary Care Provider (PCP). You will be provided with enough prescriptions to last for 90 days. After that, your prescriptions will need to be taken over by your PCP.  Recommendation Immediately contact your primary care provider to secure an appointment for evaluation  before this period is over. Do not wait until the last month to contact them.   Clarification The transfer of your medication regimen does not mean that you are being discharged from our clinic. We will remain available to you for any consultation or interventional therapies you may need.   Alternative Should you decide not to continue taking these medication and would like assistance in permanently stopping them, please let us  know so that we can design a slow tapering down of your regimen.  Reason Our primary responsibility to provide specialized interventional pain management therapies otherwise not available to the community. We have in the past assisted primary care providers with reviewing and adjusting pain medication management therapies, however, we have been transparent to all patients and referring providers that it is not our intention to permanently take over this type of therapy. Transfer of this portion of your care will assist us  in freeing time to assist others in need of our specialty services.   ______________________________________________________________________

## 2024-11-03 LAB — TOXASSURE SELECT 13 (MW), URINE

## 2024-12-07 ENCOUNTER — Other Ambulatory Visit: Payer: Self-pay | Admitting: Family Medicine

## 2024-12-07 DIAGNOSIS — Z794 Long term (current) use of insulin: Secondary | ICD-10-CM

## 2025-01-24 ENCOUNTER — Encounter: Admitting: Nurse Practitioner
